# Patient Record
Sex: Female | Born: 1955 | Race: Black or African American | Hispanic: No | Marital: Single | Smoking: Never smoker
Health system: Southern US, Community
[De-identification: ages and names within clinical notes are randomized; demographics above are authoritative.]

## PROBLEM LIST (undated history)

## (undated) DIAGNOSIS — D689 Coagulation defect, unspecified: Secondary | ICD-10-CM

## (undated) DIAGNOSIS — F329 Major depressive disorder, single episode, unspecified: Secondary | ICD-10-CM

## (undated) DIAGNOSIS — F32A Depression, unspecified: Secondary | ICD-10-CM

## (undated) DIAGNOSIS — R0600 Dyspnea, unspecified: Secondary | ICD-10-CM

## (undated) DIAGNOSIS — M961 Postlaminectomy syndrome, not elsewhere classified: Secondary | ICD-10-CM

## (undated) DIAGNOSIS — R51 Headache: Secondary | ICD-10-CM

## (undated) DIAGNOSIS — R519 Headache, unspecified: Secondary | ICD-10-CM

## (undated) DIAGNOSIS — F419 Anxiety disorder, unspecified: Secondary | ICD-10-CM

## (undated) DIAGNOSIS — I639 Cerebral infarction, unspecified: Secondary | ICD-10-CM

## (undated) DIAGNOSIS — K219 Gastro-esophageal reflux disease without esophagitis: Secondary | ICD-10-CM

## (undated) DIAGNOSIS — M47817 Spondylosis without myelopathy or radiculopathy, lumbosacral region: Secondary | ICD-10-CM

## (undated) DIAGNOSIS — G629 Polyneuropathy, unspecified: Secondary | ICD-10-CM

## (undated) HISTORY — DX: Spondylosis without myelopathy or radiculopathy, lumbosacral region: M47.817

## (undated) HISTORY — PX: ABDOMINAL HYSTERECTOMY: SHX81

## (undated) HISTORY — DX: Postlaminectomy syndrome, not elsewhere classified: M96.1

## (undated) HISTORY — PX: KNEE ARTHROSCOPY: SUR90

## (undated) HISTORY — PX: BACK SURGERY: SHX140

## (undated) HISTORY — PX: EYE SURGERY: SHX253

## (undated) SURGERY — IMAGING PROCEDURE, GI TRACT, INTRALUMINAL, VIA CAPSULE
Anesthesia: LOCAL

---

## 1997-11-24 ENCOUNTER — Other Ambulatory Visit: Admission: RE | Admit: 1997-11-24 | Discharge: 1997-11-24 | Payer: Self-pay | Admitting: Obstetrics

## 1997-12-09 ENCOUNTER — Emergency Department (HOSPITAL_COMMUNITY): Admission: EM | Admit: 1997-12-09 | Discharge: 1997-12-09 | Payer: Self-pay | Admitting: *Deleted

## 1997-12-12 ENCOUNTER — Encounter: Payer: Self-pay | Admitting: Ophthalmology

## 1997-12-12 ENCOUNTER — Ambulatory Visit (HOSPITAL_COMMUNITY): Admission: RE | Admit: 1997-12-12 | Discharge: 1997-12-13 | Payer: Self-pay | Admitting: Ophthalmology

## 1997-12-14 ENCOUNTER — Inpatient Hospital Stay (HOSPITAL_COMMUNITY): Admission: RE | Admit: 1997-12-14 | Discharge: 1997-12-17 | Payer: Self-pay | Admitting: Obstetrics

## 1998-01-24 ENCOUNTER — Emergency Department (HOSPITAL_COMMUNITY): Admission: EM | Admit: 1998-01-24 | Discharge: 1998-01-24 | Payer: Self-pay | Admitting: Emergency Medicine

## 1998-10-20 ENCOUNTER — Emergency Department (HOSPITAL_COMMUNITY): Admission: EM | Admit: 1998-10-20 | Discharge: 1998-10-20 | Payer: Self-pay | Admitting: Emergency Medicine

## 1998-10-20 ENCOUNTER — Encounter: Payer: Self-pay | Admitting: Emergency Medicine

## 1998-11-10 ENCOUNTER — Encounter: Admission: RE | Admit: 1998-11-10 | Discharge: 1998-11-10 | Payer: Self-pay | Admitting: Internal Medicine

## 1998-11-16 ENCOUNTER — Encounter: Admission: RE | Admit: 1998-11-16 | Discharge: 1998-11-16 | Payer: Self-pay | Admitting: Obstetrics

## 1998-11-21 ENCOUNTER — Encounter: Admission: RE | Admit: 1998-11-21 | Discharge: 1998-11-21 | Payer: Self-pay | Admitting: Hematology and Oncology

## 1999-05-03 ENCOUNTER — Encounter: Admission: RE | Admit: 1999-05-03 | Discharge: 1999-05-03 | Payer: Self-pay | Admitting: Hematology and Oncology

## 1999-06-27 ENCOUNTER — Encounter: Admission: RE | Admit: 1999-06-27 | Discharge: 1999-06-27 | Payer: Self-pay | Admitting: Hematology and Oncology

## 1999-08-01 ENCOUNTER — Encounter: Payer: Self-pay | Admitting: Orthopedic Surgery

## 1999-08-01 ENCOUNTER — Ambulatory Visit (HOSPITAL_COMMUNITY): Admission: RE | Admit: 1999-08-01 | Discharge: 1999-08-01 | Payer: Self-pay | Admitting: Orthopedic Surgery

## 1999-12-26 ENCOUNTER — Encounter: Admission: RE | Admit: 1999-12-26 | Discharge: 1999-12-26 | Payer: Self-pay | Admitting: Hematology and Oncology

## 2000-01-02 ENCOUNTER — Encounter: Admission: RE | Admit: 2000-01-02 | Discharge: 2000-01-02 | Payer: Self-pay

## 2000-03-11 ENCOUNTER — Encounter: Payer: Self-pay | Admitting: Emergency Medicine

## 2000-03-11 ENCOUNTER — Emergency Department (HOSPITAL_COMMUNITY): Admission: EM | Admit: 2000-03-11 | Discharge: 2000-03-11 | Payer: Self-pay | Admitting: Emergency Medicine

## 2000-03-18 ENCOUNTER — Encounter: Admission: RE | Admit: 2000-03-18 | Discharge: 2000-03-18 | Payer: Self-pay | Admitting: Internal Medicine

## 2000-03-25 ENCOUNTER — Encounter: Admission: RE | Admit: 2000-03-25 | Discharge: 2000-03-25 | Payer: Self-pay | Admitting: Obstetrics & Gynecology

## 2000-06-12 ENCOUNTER — Encounter: Admission: RE | Admit: 2000-06-12 | Discharge: 2000-06-12 | Payer: Self-pay

## 2000-07-01 ENCOUNTER — Encounter: Admission: RE | Admit: 2000-07-01 | Discharge: 2000-07-01 | Payer: Self-pay | Admitting: Obstetrics & Gynecology

## 2000-08-14 ENCOUNTER — Ambulatory Visit (HOSPITAL_COMMUNITY): Admission: RE | Admit: 2000-08-14 | Discharge: 2000-08-14 | Payer: Self-pay | Admitting: Obstetrics & Gynecology

## 2000-12-15 ENCOUNTER — Encounter: Admission: RE | Admit: 2000-12-15 | Discharge: 2000-12-15 | Payer: Self-pay

## 2000-12-17 ENCOUNTER — Encounter: Admission: RE | Admit: 2000-12-17 | Discharge: 2001-03-17 | Payer: Self-pay | Admitting: Family Medicine

## 2000-12-22 ENCOUNTER — Encounter: Admission: RE | Admit: 2000-12-22 | Discharge: 2000-12-22 | Payer: Self-pay

## 2001-02-04 ENCOUNTER — Emergency Department (HOSPITAL_COMMUNITY): Admission: EM | Admit: 2001-02-04 | Discharge: 2001-02-04 | Payer: Self-pay | Admitting: Emergency Medicine

## 2002-02-08 ENCOUNTER — Emergency Department (HOSPITAL_COMMUNITY): Admission: EM | Admit: 2002-02-08 | Discharge: 2002-02-08 | Payer: Self-pay

## 2002-03-31 ENCOUNTER — Encounter: Payer: Self-pay | Admitting: Neurology

## 2002-03-31 ENCOUNTER — Inpatient Hospital Stay (HOSPITAL_COMMUNITY): Admission: EM | Admit: 2002-03-31 | Discharge: 2002-04-10 | Payer: Self-pay | Admitting: *Deleted

## 2002-04-01 ENCOUNTER — Encounter: Payer: Self-pay | Admitting: Neurology

## 2002-04-02 ENCOUNTER — Encounter: Payer: Self-pay | Admitting: Neurology

## 2002-04-04 ENCOUNTER — Encounter: Payer: Self-pay | Admitting: Neurology

## 2002-04-05 ENCOUNTER — Encounter: Payer: Self-pay | Admitting: Neurology

## 2002-06-04 ENCOUNTER — Emergency Department (HOSPITAL_COMMUNITY): Admission: EM | Admit: 2002-06-04 | Discharge: 2002-06-04 | Payer: Self-pay | Admitting: Emergency Medicine

## 2002-06-04 ENCOUNTER — Encounter: Payer: Self-pay | Admitting: Internal Medicine

## 2002-08-02 ENCOUNTER — Emergency Department (HOSPITAL_COMMUNITY): Admission: EM | Admit: 2002-08-02 | Discharge: 2002-08-02 | Payer: Self-pay | Admitting: Emergency Medicine

## 2002-08-03 ENCOUNTER — Encounter (INDEPENDENT_AMBULATORY_CARE_PROVIDER_SITE_OTHER): Payer: Self-pay | Admitting: Family Medicine

## 2002-08-03 LAB — CONVERTED CEMR LAB: Pap Smear: NORMAL

## 2002-09-07 ENCOUNTER — Ambulatory Visit (HOSPITAL_COMMUNITY): Admission: RE | Admit: 2002-09-07 | Discharge: 2002-09-07 | Payer: Self-pay | Admitting: Family Medicine

## 2002-09-07 ENCOUNTER — Encounter: Payer: Self-pay | Admitting: Family Medicine

## 2002-09-14 ENCOUNTER — Ambulatory Visit (HOSPITAL_COMMUNITY): Admission: RE | Admit: 2002-09-14 | Discharge: 2002-09-14 | Payer: Self-pay | Admitting: Family Medicine

## 2002-09-21 ENCOUNTER — Encounter: Payer: Self-pay | Admitting: Neurology

## 2002-09-21 ENCOUNTER — Ambulatory Visit (HOSPITAL_COMMUNITY): Admission: RE | Admit: 2002-09-21 | Discharge: 2002-09-21 | Payer: Self-pay | Admitting: Neurology

## 2003-08-15 ENCOUNTER — Ambulatory Visit (HOSPITAL_COMMUNITY): Admission: RE | Admit: 2003-08-15 | Discharge: 2003-08-15 | Payer: Self-pay | Admitting: Family Medicine

## 2003-10-10 ENCOUNTER — Ambulatory Visit (HOSPITAL_COMMUNITY): Admission: RE | Admit: 2003-10-10 | Discharge: 2003-10-10 | Payer: Self-pay | Admitting: Family Medicine

## 2003-11-03 ENCOUNTER — Ambulatory Visit: Payer: Self-pay | Admitting: Family Medicine

## 2003-11-03 ENCOUNTER — Ambulatory Visit: Payer: Self-pay | Admitting: *Deleted

## 2003-11-14 ENCOUNTER — Ambulatory Visit: Payer: Self-pay | Admitting: Family Medicine

## 2003-11-28 ENCOUNTER — Encounter: Admission: RE | Admit: 2003-11-28 | Discharge: 2003-12-12 | Payer: Self-pay | Admitting: Family Medicine

## 2003-12-05 ENCOUNTER — Ambulatory Visit: Payer: Self-pay | Admitting: Family Medicine

## 2004-01-03 ENCOUNTER — Ambulatory Visit: Payer: Self-pay | Admitting: Family Medicine

## 2004-01-16 ENCOUNTER — Ambulatory Visit: Payer: Self-pay | Admitting: Family Medicine

## 2004-02-28 ENCOUNTER — Ambulatory Visit: Payer: Self-pay | Admitting: Family Medicine

## 2004-03-08 ENCOUNTER — Ambulatory Visit: Payer: Self-pay | Admitting: Family Medicine

## 2004-03-14 ENCOUNTER — Emergency Department (HOSPITAL_COMMUNITY): Admission: EM | Admit: 2004-03-14 | Discharge: 2004-03-14 | Payer: Self-pay | Admitting: Emergency Medicine

## 2004-03-16 ENCOUNTER — Ambulatory Visit: Payer: Self-pay | Admitting: Family Medicine

## 2004-03-23 ENCOUNTER — Ambulatory Visit (HOSPITAL_COMMUNITY): Admission: RE | Admit: 2004-03-23 | Discharge: 2004-03-23 | Payer: Self-pay | Admitting: Family Medicine

## 2004-04-10 ENCOUNTER — Ambulatory Visit: Payer: Self-pay | Admitting: Family Medicine

## 2004-04-23 ENCOUNTER — Ambulatory Visit: Payer: Self-pay | Admitting: Family Medicine

## 2004-05-10 ENCOUNTER — Ambulatory Visit: Payer: Self-pay | Admitting: Family Medicine

## 2004-05-16 ENCOUNTER — Ambulatory Visit: Payer: Self-pay | Admitting: Family Medicine

## 2004-05-21 ENCOUNTER — Ambulatory Visit: Payer: Self-pay | Admitting: Family Medicine

## 2004-05-23 ENCOUNTER — Ambulatory Visit (HOSPITAL_COMMUNITY): Admission: RE | Admit: 2004-05-23 | Discharge: 2004-05-23 | Payer: Self-pay | Admitting: Family Medicine

## 2004-06-01 ENCOUNTER — Ambulatory Visit: Payer: Self-pay | Admitting: Family Medicine

## 2004-06-15 ENCOUNTER — Ambulatory Visit (HOSPITAL_COMMUNITY): Admission: RE | Admit: 2004-06-15 | Discharge: 2004-06-15 | Payer: Self-pay | Admitting: Family Medicine

## 2004-06-20 ENCOUNTER — Ambulatory Visit: Payer: Self-pay | Admitting: Family Medicine

## 2004-06-25 ENCOUNTER — Emergency Department (HOSPITAL_COMMUNITY): Admission: EM | Admit: 2004-06-25 | Discharge: 2004-06-25 | Payer: Self-pay | Admitting: Emergency Medicine

## 2004-06-27 ENCOUNTER — Ambulatory Visit: Payer: Self-pay | Admitting: Internal Medicine

## 2004-08-01 ENCOUNTER — Ambulatory Visit: Payer: Self-pay | Admitting: Family Medicine

## 2004-08-28 ENCOUNTER — Ambulatory Visit: Payer: Self-pay | Admitting: Family Medicine

## 2004-09-03 ENCOUNTER — Ambulatory Visit (HOSPITAL_COMMUNITY): Admission: RE | Admit: 2004-09-03 | Discharge: 2004-09-03 | Payer: Self-pay | Admitting: Family Medicine

## 2004-10-15 ENCOUNTER — Ambulatory Visit: Payer: Self-pay | Admitting: Family Medicine

## 2004-10-23 ENCOUNTER — Ambulatory Visit: Payer: Self-pay | Admitting: Family Medicine

## 2004-11-01 ENCOUNTER — Ambulatory Visit: Payer: Self-pay | Admitting: Family Medicine

## 2004-11-08 ENCOUNTER — Ambulatory Visit: Payer: Self-pay | Admitting: Family Medicine

## 2004-11-21 ENCOUNTER — Ambulatory Visit: Payer: Self-pay | Admitting: Family Medicine

## 2004-12-26 ENCOUNTER — Ambulatory Visit: Payer: Self-pay | Admitting: Family Medicine

## 2005-01-23 ENCOUNTER — Ambulatory Visit: Payer: Self-pay | Admitting: Family Medicine

## 2005-02-06 ENCOUNTER — Ambulatory Visit: Payer: Self-pay | Admitting: Family Medicine

## 2005-03-11 ENCOUNTER — Emergency Department (HOSPITAL_COMMUNITY): Admission: EM | Admit: 2005-03-11 | Discharge: 2005-03-11 | Payer: Self-pay | Admitting: Family Medicine

## 2005-03-11 ENCOUNTER — Ambulatory Visit: Payer: Self-pay | Admitting: Family Medicine

## 2005-04-10 ENCOUNTER — Ambulatory Visit: Payer: Self-pay | Admitting: Family Medicine

## 2005-04-16 ENCOUNTER — Ambulatory Visit: Payer: Self-pay | Admitting: Family Medicine

## 2005-04-18 ENCOUNTER — Ambulatory Visit (HOSPITAL_COMMUNITY): Admission: RE | Admit: 2005-04-18 | Discharge: 2005-04-18 | Payer: Self-pay | Admitting: Family Medicine

## 2005-05-03 ENCOUNTER — Ambulatory Visit: Payer: Self-pay | Admitting: Family Medicine

## 2005-05-08 ENCOUNTER — Ambulatory Visit: Payer: Self-pay | Admitting: Family Medicine

## 2005-05-13 ENCOUNTER — Encounter: Admission: RE | Admit: 2005-05-13 | Discharge: 2005-07-24 | Payer: Self-pay | Admitting: Family Medicine

## 2005-05-15 ENCOUNTER — Emergency Department (HOSPITAL_COMMUNITY): Admission: EM | Admit: 2005-05-15 | Discharge: 2005-05-15 | Payer: Self-pay | Admitting: Family Medicine

## 2005-05-20 ENCOUNTER — Ambulatory Visit: Payer: Self-pay | Admitting: Family Medicine

## 2005-05-20 ENCOUNTER — Ambulatory Visit (HOSPITAL_COMMUNITY): Admission: RE | Admit: 2005-05-20 | Discharge: 2005-05-20 | Payer: Self-pay | Admitting: Family Medicine

## 2005-06-10 ENCOUNTER — Ambulatory Visit: Payer: Self-pay | Admitting: Family Medicine

## 2005-06-17 ENCOUNTER — Ambulatory Visit: Payer: Self-pay | Admitting: Family Medicine

## 2005-06-25 ENCOUNTER — Ambulatory Visit: Payer: Self-pay | Admitting: Family Medicine

## 2005-06-27 ENCOUNTER — Emergency Department (HOSPITAL_COMMUNITY): Admission: EM | Admit: 2005-06-27 | Discharge: 2005-06-27 | Payer: Self-pay | Admitting: Emergency Medicine

## 2005-07-03 ENCOUNTER — Ambulatory Visit: Payer: Self-pay | Admitting: Family Medicine

## 2005-08-13 ENCOUNTER — Ambulatory Visit: Payer: Self-pay | Admitting: Family Medicine

## 2005-08-20 ENCOUNTER — Encounter: Admission: RE | Admit: 2005-08-20 | Discharge: 2005-11-18 | Payer: Self-pay | Admitting: Family Medicine

## 2005-08-28 ENCOUNTER — Ambulatory Visit: Payer: Self-pay | Admitting: Internal Medicine

## 2005-09-09 ENCOUNTER — Ambulatory Visit: Payer: Self-pay | Admitting: Family Medicine

## 2005-09-26 ENCOUNTER — Ambulatory Visit: Payer: Self-pay | Admitting: Internal Medicine

## 2005-10-29 ENCOUNTER — Ambulatory Visit: Payer: Self-pay | Admitting: Family Medicine

## 2005-11-28 ENCOUNTER — Ambulatory Visit: Payer: Self-pay | Admitting: Internal Medicine

## 2005-12-10 ENCOUNTER — Ambulatory Visit: Payer: Self-pay | Admitting: Family Medicine

## 2005-12-18 ENCOUNTER — Ambulatory Visit: Payer: Self-pay | Admitting: Family Medicine

## 2005-12-23 DIAGNOSIS — S0570XA Avulsion of unspecified eye, initial encounter: Secondary | ICD-10-CM

## 2006-01-08 ENCOUNTER — Ambulatory Visit: Payer: Self-pay | Admitting: Internal Medicine

## 2006-01-28 ENCOUNTER — Ambulatory Visit (HOSPITAL_BASED_OUTPATIENT_CLINIC_OR_DEPARTMENT_OTHER): Admission: RE | Admit: 2006-01-28 | Discharge: 2006-01-28 | Payer: Self-pay | Admitting: Orthopaedic Surgery

## 2006-02-05 ENCOUNTER — Ambulatory Visit: Payer: Self-pay | Admitting: Family Medicine

## 2006-02-17 ENCOUNTER — Ambulatory Visit: Payer: Self-pay | Admitting: Family Medicine

## 2006-03-05 ENCOUNTER — Ambulatory Visit: Payer: Self-pay | Admitting: Family Medicine

## 2006-03-07 ENCOUNTER — Ambulatory Visit (HOSPITAL_COMMUNITY): Admission: RE | Admit: 2006-03-07 | Discharge: 2006-03-07 | Payer: Self-pay | Admitting: Family Medicine

## 2006-03-19 ENCOUNTER — Encounter: Admission: RE | Admit: 2006-03-19 | Discharge: 2006-03-19 | Payer: Self-pay | Admitting: Family Medicine

## 2006-03-19 ENCOUNTER — Emergency Department (HOSPITAL_COMMUNITY): Admission: EM | Admit: 2006-03-19 | Discharge: 2006-03-19 | Payer: Self-pay | Admitting: Emergency Medicine

## 2006-04-02 ENCOUNTER — Ambulatory Visit: Payer: Self-pay | Admitting: Family Medicine

## 2006-05-07 ENCOUNTER — Ambulatory Visit: Payer: Self-pay | Admitting: Family Medicine

## 2006-05-20 ENCOUNTER — Ambulatory Visit: Payer: Self-pay | Admitting: Family Medicine

## 2006-06-17 ENCOUNTER — Ambulatory Visit: Payer: Self-pay | Admitting: Family Medicine

## 2006-06-24 ENCOUNTER — Ambulatory Visit: Payer: Self-pay | Admitting: Family Medicine

## 2006-07-03 ENCOUNTER — Ambulatory Visit: Payer: Self-pay | Admitting: Family Medicine

## 2006-07-09 ENCOUNTER — Ambulatory Visit: Payer: Self-pay | Admitting: Family Medicine

## 2006-07-23 ENCOUNTER — Ambulatory Visit: Payer: Self-pay | Admitting: Family Medicine

## 2006-07-24 ENCOUNTER — Encounter (INDEPENDENT_AMBULATORY_CARE_PROVIDER_SITE_OTHER): Payer: Self-pay | Admitting: Family Medicine

## 2006-07-24 DIAGNOSIS — M1990 Unspecified osteoarthritis, unspecified site: Secondary | ICD-10-CM | POA: Insufficient documentation

## 2006-07-24 DIAGNOSIS — F329 Major depressive disorder, single episode, unspecified: Secondary | ICD-10-CM | POA: Insufficient documentation

## 2006-07-24 DIAGNOSIS — G5600 Carpal tunnel syndrome, unspecified upper limb: Secondary | ICD-10-CM

## 2006-07-24 DIAGNOSIS — M5106 Intervertebral disc disorders with myelopathy, lumbar region: Secondary | ICD-10-CM

## 2006-07-24 DIAGNOSIS — H4420 Degenerative myopia, unspecified eye: Secondary | ICD-10-CM | POA: Insufficient documentation

## 2006-07-24 DIAGNOSIS — G08 Intracranial and intraspinal phlebitis and thrombophlebitis: Secondary | ICD-10-CM

## 2006-08-27 ENCOUNTER — Ambulatory Visit: Payer: Self-pay | Admitting: Family Medicine

## 2006-08-31 ENCOUNTER — Emergency Department (HOSPITAL_COMMUNITY): Admission: EM | Admit: 2006-08-31 | Discharge: 2006-08-31 | Payer: Self-pay | Admitting: Emergency Medicine

## 2006-09-01 ENCOUNTER — Ambulatory Visit: Payer: Self-pay | Admitting: Family Medicine

## 2006-09-09 ENCOUNTER — Ambulatory Visit: Payer: Self-pay | Admitting: Internal Medicine

## 2006-09-16 ENCOUNTER — Ambulatory Visit: Payer: Self-pay | Admitting: Family Medicine

## 2006-09-17 ENCOUNTER — Ambulatory Visit (HOSPITAL_COMMUNITY): Admission: RE | Admit: 2006-09-17 | Discharge: 2006-09-17 | Payer: Self-pay | Admitting: Family Medicine

## 2006-09-22 ENCOUNTER — Ambulatory Visit (HOSPITAL_COMMUNITY): Admission: RE | Admit: 2006-09-22 | Discharge: 2006-09-22 | Payer: Self-pay | Admitting: Family Medicine

## 2006-09-24 ENCOUNTER — Ambulatory Visit: Payer: Self-pay | Admitting: Family Medicine

## 2006-10-27 ENCOUNTER — Ambulatory Visit: Payer: Self-pay | Admitting: Internal Medicine

## 2006-11-05 ENCOUNTER — Ambulatory Visit: Payer: Self-pay | Admitting: Family Medicine

## 2006-11-28 ENCOUNTER — Ambulatory Visit: Payer: Self-pay | Admitting: Family Medicine

## 2006-12-08 ENCOUNTER — Emergency Department (HOSPITAL_COMMUNITY): Admission: EM | Admit: 2006-12-08 | Discharge: 2006-12-08 | Payer: Self-pay | Admitting: Emergency Medicine

## 2006-12-11 ENCOUNTER — Ambulatory Visit: Payer: Self-pay | Admitting: Family Medicine

## 2006-12-18 ENCOUNTER — Ambulatory Visit: Payer: Self-pay | Admitting: Family Medicine

## 2006-12-24 ENCOUNTER — Ambulatory Visit: Payer: Self-pay | Admitting: Nurse Practitioner

## 2007-01-21 ENCOUNTER — Ambulatory Visit: Payer: Self-pay | Admitting: Family Medicine

## 2007-02-02 ENCOUNTER — Ambulatory Visit: Payer: Self-pay | Admitting: Family Medicine

## 2007-02-04 ENCOUNTER — Ambulatory Visit: Payer: Self-pay | Admitting: Family Medicine

## 2007-02-11 ENCOUNTER — Ambulatory Visit: Payer: Self-pay | Admitting: Family Medicine

## 2007-02-16 ENCOUNTER — Emergency Department (HOSPITAL_COMMUNITY): Admission: EM | Admit: 2007-02-16 | Discharge: 2007-02-16 | Payer: Self-pay | Admitting: Emergency Medicine

## 2007-02-19 ENCOUNTER — Ambulatory Visit: Payer: Self-pay | Admitting: Cardiology

## 2007-02-23 ENCOUNTER — Ambulatory Visit: Payer: Self-pay | Admitting: Family Medicine

## 2007-03-02 ENCOUNTER — Ambulatory Visit: Payer: Self-pay | Admitting: Family Medicine

## 2007-03-18 ENCOUNTER — Ambulatory Visit: Payer: Self-pay | Admitting: Family Medicine

## 2007-04-15 ENCOUNTER — Ambulatory Visit: Payer: Self-pay | Admitting: Family Medicine

## 2007-05-11 ENCOUNTER — Ambulatory Visit: Payer: Self-pay | Admitting: Family Medicine

## 2007-05-25 ENCOUNTER — Ambulatory Visit: Payer: Self-pay | Admitting: Family Medicine

## 2007-06-02 ENCOUNTER — Emergency Department (HOSPITAL_COMMUNITY): Admission: EM | Admit: 2007-06-02 | Discharge: 2007-06-02 | Payer: Self-pay | Admitting: Emergency Medicine

## 2007-06-22 ENCOUNTER — Ambulatory Visit: Payer: Self-pay | Admitting: Family Medicine

## 2007-07-13 ENCOUNTER — Ambulatory Visit: Payer: Self-pay | Admitting: Internal Medicine

## 2007-08-03 ENCOUNTER — Ambulatory Visit: Payer: Self-pay | Admitting: Family Medicine

## 2007-08-31 ENCOUNTER — Ambulatory Visit: Payer: Self-pay | Admitting: Family Medicine

## 2007-10-26 ENCOUNTER — Ambulatory Visit: Payer: Self-pay | Admitting: Family Medicine

## 2007-11-19 ENCOUNTER — Ambulatory Visit: Payer: Self-pay | Admitting: Family Medicine

## 2007-11-19 LAB — CONVERTED CEMR LAB: Prothrombin Time: 35.8 s — ABNORMAL HIGH (ref 11.6–15.2)

## 2007-12-21 ENCOUNTER — Ambulatory Visit: Payer: Self-pay | Admitting: Family Medicine

## 2008-01-06 ENCOUNTER — Ambulatory Visit (HOSPITAL_COMMUNITY)
Admission: RE | Admit: 2008-01-06 | Discharge: 2008-01-06 | Payer: Self-pay | Admitting: Physical Medicine and Rehabilitation

## 2008-01-16 ENCOUNTER — Emergency Department (HOSPITAL_COMMUNITY): Admission: EM | Admit: 2008-01-16 | Discharge: 2008-01-16 | Payer: Self-pay | Admitting: Emergency Medicine

## 2008-02-01 ENCOUNTER — Ambulatory Visit: Payer: Self-pay | Admitting: Family Medicine

## 2008-02-01 LAB — CONVERTED CEMR LAB: Prothrombin Time: 23.6 s — ABNORMAL HIGH (ref 11.6–15.2)

## 2008-02-02 ENCOUNTER — Ambulatory Visit (HOSPITAL_COMMUNITY): Admission: RE | Admit: 2008-02-02 | Discharge: 2008-02-02 | Payer: Self-pay | Admitting: Neurological Surgery

## 2008-03-01 ENCOUNTER — Ambulatory Visit: Payer: Self-pay | Admitting: Family Medicine

## 2008-03-14 ENCOUNTER — Ambulatory Visit: Payer: Self-pay | Admitting: Family Medicine

## 2008-03-21 ENCOUNTER — Ambulatory Visit: Payer: Self-pay | Admitting: Family Medicine

## 2008-04-08 ENCOUNTER — Ambulatory Visit: Payer: Self-pay | Admitting: Family Medicine

## 2008-04-18 ENCOUNTER — Ambulatory Visit: Payer: Self-pay | Admitting: Family Medicine

## 2008-04-25 ENCOUNTER — Ambulatory Visit: Payer: Self-pay | Admitting: Family Medicine

## 2008-05-02 ENCOUNTER — Ambulatory Visit: Payer: Self-pay | Admitting: Family Medicine

## 2008-05-23 ENCOUNTER — Ambulatory Visit: Payer: Self-pay | Admitting: Adult Health

## 2008-06-20 ENCOUNTER — Ambulatory Visit: Payer: Self-pay | Admitting: Adult Health

## 2008-07-13 ENCOUNTER — Ambulatory Visit: Payer: Self-pay | Admitting: Family Medicine

## 2008-07-13 LAB — CONVERTED CEMR LAB
INR: 1 (ref 0.0–1.5)
Prothrombin Time: 13.6 s (ref 11.6–15.2)
aPTT: 27 s (ref 24–37)

## 2008-07-26 ENCOUNTER — Ambulatory Visit: Payer: Self-pay | Admitting: Family Medicine

## 2008-07-27 ENCOUNTER — Encounter (INDEPENDENT_AMBULATORY_CARE_PROVIDER_SITE_OTHER): Payer: Self-pay | Admitting: Family Medicine

## 2008-07-27 LAB — CONVERTED CEMR LAB
ALT: 15 units/L (ref 0–35)
AST: 17 units/L (ref 0–37)
Alkaline Phosphatase: 96 units/L (ref 39–117)
BUN: 11 mg/dL (ref 6–23)
Basophils Relative: 0 % (ref 0–1)
Chloride: 106 meq/L (ref 96–112)
Creatinine, Ser: 0.64 mg/dL (ref 0.40–1.20)
Eosinophils Absolute: 0.1 10*3/uL (ref 0.0–0.7)
HDL: 73 mg/dL (ref >39)
LDL Cholesterol: 130 mg/dL — ABNORMAL HIGH (ref 0–99)
MCHC: 33 g/dL (ref 30.0–36.0)
MCV: 97 fL (ref 78.0–100.0)
Monocytes Relative: 9 % (ref 3–12)
Neutro Abs: 7.2 10*3/uL (ref 1.7–7.7)
Neutrophils Relative %: 66 % (ref 43–77)
Platelets: 314 10*3/uL (ref 150–400)
RBC: 3.94 M/uL (ref 3.87–5.11)
Sed Rate: 45 mm/hr — ABNORMAL HIGH (ref 0–22)
Total Bilirubin: 0.3 mg/dL (ref 0.3–1.2)
Total CHOL/HDL Ratio: 3.1
VLDL: 21 mg/dL (ref 0–40)
WBC: 10.9 10*3/uL — ABNORMAL HIGH (ref 4.0–10.5)

## 2008-08-02 ENCOUNTER — Ambulatory Visit (HOSPITAL_COMMUNITY): Admission: RE | Admit: 2008-08-02 | Discharge: 2008-08-02 | Payer: Self-pay | Admitting: Family Medicine

## 2008-08-17 ENCOUNTER — Ambulatory Visit: Payer: Self-pay | Admitting: Family Medicine

## 2008-09-20 ENCOUNTER — Ambulatory Visit: Payer: Self-pay | Admitting: Family Medicine

## 2008-10-10 ENCOUNTER — Ambulatory Visit: Payer: Self-pay | Admitting: Family Medicine

## 2008-10-31 ENCOUNTER — Ambulatory Visit: Payer: Self-pay | Admitting: Family Medicine

## 2008-11-11 ENCOUNTER — Ambulatory Visit (HOSPITAL_COMMUNITY): Admission: RE | Admit: 2008-11-11 | Discharge: 2008-11-11 | Payer: Self-pay | Admitting: *Deleted

## 2008-11-14 ENCOUNTER — Ambulatory Visit: Payer: Self-pay | Admitting: Family Medicine

## 2008-12-06 ENCOUNTER — Ambulatory Visit: Payer: Self-pay | Admitting: Family Medicine

## 2009-01-04 ENCOUNTER — Ambulatory Visit: Payer: Self-pay | Admitting: Thoracic Surgery

## 2009-01-12 ENCOUNTER — Ambulatory Visit: Payer: Self-pay | Admitting: Thoracic Surgery

## 2009-01-12 ENCOUNTER — Inpatient Hospital Stay (HOSPITAL_COMMUNITY): Admission: RE | Admit: 2009-01-12 | Discharge: 2009-01-31 | Payer: Self-pay | Admitting: *Deleted

## 2009-01-24 ENCOUNTER — Encounter: Payer: Self-pay | Admitting: Orthopedic Surgery

## 2009-02-12 ENCOUNTER — Emergency Department (HOSPITAL_COMMUNITY): Admission: EM | Admit: 2009-02-12 | Discharge: 2009-02-12 | Payer: Self-pay | Admitting: Emergency Medicine

## 2009-02-21 ENCOUNTER — Encounter: Admission: RE | Admit: 2009-02-21 | Discharge: 2009-02-21 | Payer: Self-pay | Admitting: Thoracic Surgery

## 2009-02-21 ENCOUNTER — Ambulatory Visit: Payer: Self-pay | Admitting: Thoracic Surgery

## 2009-03-09 ENCOUNTER — Emergency Department (HOSPITAL_COMMUNITY): Admission: EM | Admit: 2009-03-09 | Discharge: 2009-03-09 | Payer: Self-pay | Admitting: Emergency Medicine

## 2009-03-09 ENCOUNTER — Telehealth (INDEPENDENT_AMBULATORY_CARE_PROVIDER_SITE_OTHER): Payer: Self-pay | Admitting: *Deleted

## 2009-03-30 ENCOUNTER — Ambulatory Visit: Payer: Self-pay | Admitting: Family Medicine

## 2009-03-30 ENCOUNTER — Ambulatory Visit: Payer: Self-pay | Admitting: Internal Medicine

## 2009-04-04 ENCOUNTER — Ambulatory Visit: Payer: Self-pay | Admitting: Thoracic Surgery

## 2009-04-04 ENCOUNTER — Encounter: Admission: RE | Admit: 2009-04-04 | Discharge: 2009-04-04 | Payer: Self-pay | Admitting: Thoracic Surgery

## 2009-06-07 ENCOUNTER — Encounter: Admission: RE | Admit: 2009-06-07 | Discharge: 2009-08-22 | Payer: Self-pay | Admitting: *Deleted

## 2009-06-14 ENCOUNTER — Encounter: Admission: RE | Admit: 2009-06-14 | Discharge: 2009-06-14 | Payer: Self-pay | Admitting: Thoracic Surgery

## 2009-06-14 ENCOUNTER — Ambulatory Visit: Payer: Self-pay | Admitting: Thoracic Surgery

## 2009-07-10 ENCOUNTER — Ambulatory Visit: Payer: Self-pay | Admitting: Internal Medicine

## 2009-07-10 ENCOUNTER — Emergency Department (HOSPITAL_COMMUNITY): Admission: EM | Admit: 2009-07-10 | Discharge: 2009-07-10 | Payer: Self-pay | Admitting: Emergency Medicine

## 2009-07-10 ENCOUNTER — Observation Stay (HOSPITAL_COMMUNITY): Admission: EM | Admit: 2009-07-10 | Discharge: 2009-07-11 | Payer: Self-pay | Admitting: Emergency Medicine

## 2009-07-11 ENCOUNTER — Encounter: Payer: Self-pay | Admitting: Cardiology

## 2009-07-17 ENCOUNTER — Telehealth (INDEPENDENT_AMBULATORY_CARE_PROVIDER_SITE_OTHER): Payer: Self-pay | Admitting: *Deleted

## 2009-10-18 ENCOUNTER — Encounter
Admission: RE | Admit: 2009-10-18 | Discharge: 2010-01-16 | Payer: Self-pay | Admitting: Physical Medicine & Rehabilitation

## 2009-10-26 ENCOUNTER — Ambulatory Visit: Payer: Self-pay | Admitting: Physical Medicine & Rehabilitation

## 2009-11-17 ENCOUNTER — Ambulatory Visit: Payer: Self-pay | Admitting: Physical Medicine & Rehabilitation

## 2010-02-06 ENCOUNTER — Encounter
Admission: RE | Admit: 2010-02-06 | Discharge: 2010-04-03 | Payer: Self-pay | Source: Home / Self Care | Attending: Physical Medicine & Rehabilitation | Admitting: Physical Medicine & Rehabilitation

## 2010-02-12 ENCOUNTER — Ambulatory Visit: Payer: Self-pay | Admitting: Physical Medicine & Rehabilitation

## 2010-03-14 ENCOUNTER — Emergency Department (HOSPITAL_COMMUNITY)
Admission: EM | Admit: 2010-03-14 | Discharge: 2010-03-14 | Payer: Self-pay | Source: Home / Self Care | Admitting: Emergency Medicine

## 2010-03-19 LAB — URINE CULTURE
Colony Count: 9000
Culture  Setup Time: 201201111055

## 2010-03-19 LAB — URINALYSIS, ROUTINE W REFLEX MICROSCOPIC
Bilirubin Urine: NEGATIVE
Hgb urine dipstick: NEGATIVE
Ketones, ur: NEGATIVE mg/dL
Nitrite: NEGATIVE
Protein, ur: NEGATIVE mg/dL
Specific Gravity, Urine: 1.021 (ref 1.005–1.030)
Urine Glucose, Fasting: NEGATIVE mg/dL
Urobilinogen, UA: 0.2 mg/dL (ref 0.0–1.0)
pH: 5.5 (ref 5.0–8.0)

## 2010-03-19 LAB — POCT I-STAT, CHEM 8
BUN: 21 mg/dL (ref 6–23)
Calcium, Ion: 1.21 mmol/L (ref 1.12–1.32)
Chloride: 107 mEq/L (ref 96–112)
Creatinine, Ser: 0.7 mg/dL (ref 0.4–1.2)
Glucose, Bld: 87 mg/dL (ref 70–99)
HCT: 38 % (ref 36.0–46.0)
Hemoglobin: 12.9 g/dL (ref 12.0–15.0)
Potassium: 4.1 mEq/L (ref 3.5–5.1)
Sodium: 138 mEq/L (ref 135–145)
TCO2: 25 mmol/L (ref 0–100)

## 2010-03-19 LAB — URINE MICROSCOPIC-ADD ON

## 2010-03-25 ENCOUNTER — Encounter: Payer: Self-pay | Admitting: Family Medicine

## 2010-03-25 ENCOUNTER — Encounter: Payer: Self-pay | Admitting: Thoracic Surgery

## 2010-03-26 ENCOUNTER — Encounter
Admission: RE | Admit: 2010-03-26 | Discharge: 2010-04-03 | Payer: Self-pay | Source: Home / Self Care | Attending: Physical Medicine & Rehabilitation | Admitting: Physical Medicine & Rehabilitation

## 2010-03-29 ENCOUNTER — Ambulatory Visit
Admission: RE | Admit: 2010-03-29 | Discharge: 2010-03-29 | Payer: Self-pay | Source: Home / Self Care | Attending: Physical Medicine & Rehabilitation | Admitting: Physical Medicine & Rehabilitation

## 2010-04-05 NOTE — Progress Notes (Signed)
  Faxed LOV,CT over to [facility] @ [address] [facility] Come [phone] [facility]  Jul 17, 2009 10:40 AM

## 2010-04-05 NOTE — Progress Notes (Signed)
Summary: triage/vomiting and diarrhea  Phone Note Call from Patient   Reason for Call: Talk to Nurse Summary of Call: Call from [facility] with [facility].Marland Kitchenout to discharge patient from back surgery which was done by thoracic approach in which she had 2 chest tubes on in 01/19/09...with lastest chest xray of 02/21/09 showed small pleural effusion....  Patient is started vomiting green bile early about 3am and had vomited 7 or 8 times since then.  She had been having constant abd. pain stating it is all over her abd not any one location. She has been having diarrhea 3 times since the nurse has been there today.and more before nurse arrived.Marland KitchenMarland KitchenNo blood seen.Marland KitchenMarland KitchenPatient has tried using Immodium.Marland KitchenMarland KitchenShe cannot keep any fluids on her stomach.Marland KitchenMarland KitchenShe denies any ETOH.Marland KitchenMarland KitchenShe is on chronic coumadin therapy and had a stoke in 2004.  [facility] advise patient should go to ED.Marland KitchenPatient may require fluids..Her pleural effusion may need to be monitored while patient is having vomiting episodes.Marland KitchenMarland KitchenPatient states she had an episode of vomiting and diarrhea like this while she was in hospital..[facility] states understanding and is relaying information to patient... Initial call taken by: Conchita Paris,  March 09, 2009 11:42 AM

## 2010-05-17 ENCOUNTER — Encounter: Payer: Self-pay | Admitting: Physical Medicine & Rehabilitation

## 2010-05-19 LAB — DIFFERENTIAL
Basophils Absolute: 0 10*3/uL (ref 0.0–0.1)
Basophils Relative: 0 % (ref 0–1)
Lymphocytes Relative: 17 % (ref 12–46)
Neutro Abs: 7.1 10*3/uL (ref 1.7–7.7)
Neutrophils Relative %: 75 % (ref 43–77)

## 2010-05-19 LAB — URINALYSIS, ROUTINE W REFLEX MICROSCOPIC
Glucose, UA: NEGATIVE mg/dL
Ketones, ur: 40 mg/dL — AB
Leukocytes, UA: NEGATIVE
Nitrite: NEGATIVE
Protein, ur: NEGATIVE mg/dL
Urobilinogen, UA: 0.2 mg/dL (ref 0.0–1.0)

## 2010-05-19 LAB — URINE MICROSCOPIC-ADD ON

## 2010-05-19 LAB — COMPREHENSIVE METABOLIC PANEL
Alkaline Phosphatase: 67 U/L (ref 39–117)
BUN: 5 mg/dL — ABNORMAL LOW (ref 6–23)
Creatinine, Ser: 0.59 mg/dL (ref 0.4–1.2)
Glucose, Bld: 143 mg/dL — ABNORMAL HIGH (ref 70–99)
Potassium: 3.2 mEq/L — ABNORMAL LOW (ref 3.5–5.1)
Total Bilirubin: 0.7 mg/dL (ref 0.3–1.2)
Total Protein: 8.3 g/dL (ref 6.0–8.3)

## 2010-05-19 LAB — PROTIME-INR
INR: 2.31 — ABNORMAL HIGH (ref 0.00–1.49)
Prothrombin Time: 25.2 seconds — ABNORMAL HIGH (ref 11.6–15.2)

## 2010-05-19 LAB — CBC
HCT: 41.2 % (ref 36.0–46.0)
Hemoglobin: 14.3 g/dL (ref 12.0–15.0)
MCV: 94.2 fL (ref 78.0–100.0)
Platelets: 285 10*3/uL (ref 150–400)
RDW: 16 % — ABNORMAL HIGH (ref 11.5–15.5)

## 2010-05-22 LAB — URINE CULTURE

## 2010-05-22 LAB — LIPID PANEL
Cholesterol: 180 mg/dL (ref 0–200)
LDL Cholesterol: 103 mg/dL — ABNORMAL HIGH (ref 0–99)

## 2010-05-22 LAB — CK TOTAL AND CKMB (NOT AT ARMC)
CK, MB: 0.6 ng/mL (ref 0.3–4.0)
CK, MB: 0.7 ng/mL (ref 0.3–4.0)
Relative Index: INVALID (ref 0.0–2.5)
Relative Index: INVALID (ref 0.0–2.5)
Total CK: 54 U/L (ref 7–177)

## 2010-05-22 LAB — TROPONIN I: Troponin I: 0.01 ng/mL (ref 0.00–0.06)

## 2010-05-22 LAB — BASIC METABOLIC PANEL
BUN: 11 mg/dL (ref 6–23)
BUN: 9 mg/dL (ref 6–23)
Calcium: 9.1 mg/dL (ref 8.4–10.5)
Calcium: 9.2 mg/dL (ref 8.4–10.5)
Chloride: 105 mEq/L (ref 96–112)
Creatinine, Ser: 0.42 mg/dL (ref 0.4–1.2)
Creatinine, Ser: 0.54 mg/dL (ref 0.4–1.2)
GFR calc Af Amer: >60 mL/min (ref >60)
GFR calc Af Amer: >60 mL/min (ref >60)
GFR calc non Af Amer: >60 mL/min (ref >60)
GFR calc non Af Amer: >60 mL/min (ref >60)
GFR calc non Af Amer: >60 mL/min (ref >60)
Glucose, Bld: 146 mg/dL — ABNORMAL HIGH (ref 70–99)
Potassium: 3.1 mEq/L — ABNORMAL LOW (ref 3.5–5.1)

## 2010-05-22 LAB — DIFFERENTIAL
Basophils Absolute: 0 10*3/uL (ref 0.0–0.1)
Basophils Relative: 0 % (ref 0–1)
Basophils Relative: 0 % (ref 0–1)
Eosinophils Absolute: 0.1 10*3/uL (ref 0.0–0.7)
Eosinophils Absolute: 0.1 10*3/uL (ref 0.0–0.7)
Eosinophils Relative: 2 % (ref 0–5)
Monocytes Relative: 10 % (ref 3–12)
Neutrophils Relative %: 58 % (ref 43–77)
Neutrophils Relative %: 67 % (ref 43–77)

## 2010-05-22 LAB — URINALYSIS, ROUTINE W REFLEX MICROSCOPIC
Hgb urine dipstick: NEGATIVE
Specific Gravity, Urine: 1.02 (ref 1.005–1.030)
Urobilinogen, UA: 0.2 mg/dL (ref 0.0–1.0)

## 2010-05-22 LAB — CARDIAC PANEL(CRET KIN+CKTOT+MB+TROPI)
CK, MB: 0.6 ng/mL (ref 0.3–4.0)
Relative Index: INVALID (ref 0.0–2.5)
Relative Index: INVALID (ref 0.0–2.5)
Total CK: 53 U/L (ref 7–177)
Troponin I: 0.01 ng/mL (ref 0.00–0.06)
Troponin I: <0.01 ng/mL (ref 0.00–0.06)

## 2010-05-22 LAB — CBC
MCHC: 34.4 g/dL (ref 30.0–36.0)
MCV: 100.2 fL — ABNORMAL HIGH (ref 78.0–100.0)
Platelets: 235 10*3/uL (ref 150–400)
Platelets: 237 10*3/uL (ref 150–400)
RDW: 13.3 % (ref 11.5–15.5)
WBC: 8.7 10*3/uL (ref 4.0–10.5)

## 2010-05-22 LAB — RAPID URINE DRUG SCREEN, HOSP PERFORMED
Barbiturates: NOT DETECTED
Benzodiazepines: NOT DETECTED
Cocaine: NOT DETECTED

## 2010-05-22 LAB — URINE MICROSCOPIC-ADD ON

## 2010-05-22 LAB — PHOSPHORUS: Phosphorus: 4.6 mg/dL (ref 2.3–4.6)

## 2010-05-22 LAB — PROTIME-INR
INR: 1.67 — ABNORMAL HIGH (ref 0.00–1.49)
INR: 2.04 — ABNORMAL HIGH (ref 0.00–1.49)
Prothrombin Time: 19.6 seconds — ABNORMAL HIGH (ref 11.6–15.2)
Prothrombin Time: 22.9 seconds — ABNORMAL HIGH (ref 11.6–15.2)

## 2010-06-05 LAB — URINALYSIS, ROUTINE W REFLEX MICROSCOPIC
Hgb urine dipstick: NEGATIVE
Nitrite: NEGATIVE
Protein, ur: NEGATIVE mg/dL
Specific Gravity, Urine: 1.014 (ref 1.005–1.030)
Urobilinogen, UA: 0.2 mg/dL (ref 0.0–1.0)

## 2010-06-05 LAB — CBC
HCT: 38.9 % (ref 36.0–46.0)
Hemoglobin: 13 g/dL (ref 12.0–15.0)
Platelets: 293 10*3/uL (ref 150–400)
RDW: 15.1 % (ref 11.5–15.5)
WBC: 7.6 10*3/uL (ref 4.0–10.5)

## 2010-06-05 LAB — URINE MICROSCOPIC-ADD ON

## 2010-06-05 LAB — DIFFERENTIAL
Basophils Absolute: 0 10*3/uL (ref 0.0–0.1)
Eosinophils Relative: 3 % (ref 0–5)
Lymphocytes Relative: 22 % (ref 12–46)
Lymphs Abs: 1.7 10*3/uL (ref 0.7–4.0)
Neutro Abs: 4.8 10*3/uL (ref 1.7–7.7)

## 2010-06-05 LAB — BASIC METABOLIC PANEL
BUN: <1 mg/dL — ABNORMAL LOW (ref 6–23)
Calcium: 8.7 mg/dL (ref 8.4–10.5)
GFR calc non Af Amer: >60 mL/min (ref >60)
Potassium: 3.3 mEq/L — ABNORMAL LOW (ref 3.5–5.1)
Sodium: 132 mEq/L — ABNORMAL LOW (ref 135–145)

## 2010-06-05 LAB — PROTIME-INR
INR: 2.11 — ABNORMAL HIGH (ref 0.00–1.49)
Prothrombin Time: 23.5 seconds — ABNORMAL HIGH (ref 11.6–15.2)

## 2010-06-06 LAB — BASIC METABOLIC PANEL WITH GFR
BUN: 1 mg/dL — ABNORMAL LOW (ref 6–23)
BUN: 5 mg/dL — ABNORMAL LOW (ref 6–23)
CO2: 27 meq/L (ref 19–32)
CO2: 28 meq/L (ref 19–32)
Calcium: 7.2 mg/dL — ABNORMAL LOW (ref 8.4–10.5)
Calcium: 8.2 mg/dL — ABNORMAL LOW (ref 8.4–10.5)
Chloride: 100 meq/L (ref 96–112)
Chloride: 108 meq/L (ref 96–112)
Creatinine, Ser: 0.41 mg/dL (ref 0.4–1.2)
Creatinine, Ser: 0.55 mg/dL (ref 0.4–1.2)
GFR calc non Af Amer: >60 mL/min
GFR calc non Af Amer: >60 mL/min
Glucose, Bld: 150 mg/dL — ABNORMAL HIGH (ref 70–99)
Glucose, Bld: 98 mg/dL (ref 70–99)
Potassium: 3.6 meq/L (ref 3.5–5.1)
Potassium: 3.9 meq/L (ref 3.5–5.1)
Sodium: 134 meq/L — ABNORMAL LOW (ref 135–145)
Sodium: 137 meq/L (ref 135–145)

## 2010-06-06 LAB — TYPE AND SCREEN
ABO/RH(D): O POS
Antibody Screen: NEGATIVE

## 2010-06-06 LAB — POCT I-STAT 3, ART BLOOD GAS (G3+)
Patient temperature: 97.5
pH, Arterial: 7.458 — ABNORMAL HIGH (ref 7.350–7.400)

## 2010-06-06 LAB — PROTIME-INR
INR: 1.03 (ref 0.00–1.49)
INR: 1.1 (ref 0.00–1.49)
INR: 1.1 (ref 0.00–1.49)
INR: 1.16 (ref 0.00–1.49)
INR: 1.23 (ref 0.00–1.49)
INR: 1.49 (ref 0.00–1.49)
INR: 1.52 — ABNORMAL HIGH (ref 0.00–1.49)
INR: 0.98 (ref 0.00–1.49)
Prothrombin Time: 13.4 seconds (ref 11.6–15.2)
Prothrombin Time: 14.1 seconds (ref 11.6–15.2)
Prothrombin Time: 14.7 seconds (ref 11.6–15.2)
Prothrombin Time: 15.4 seconds — ABNORMAL HIGH (ref 11.6–15.2)
Prothrombin Time: 17.9 seconds — ABNORMAL HIGH (ref 11.6–15.2)
Prothrombin Time: 12.9 s (ref 11.6–15.2)
Prothrombin Time: 14.4 seconds (ref 11.6–15.2)

## 2010-06-06 LAB — URINALYSIS, MICROSCOPIC ONLY
Bilirubin Urine: NEGATIVE
Glucose, UA: NEGATIVE mg/dL
Hgb urine dipstick: NEGATIVE
Nitrite: NEGATIVE
Specific Gravity, Urine: 1.006 (ref 1.005–1.030)
pH: 7.5 (ref 5.0–8.0)

## 2010-06-06 LAB — POCT I-STAT 7, (LYTES, BLD GAS, ICA,H+H)
Calcium, Ion: 1.17 mmol/L (ref 1.12–1.32)
Hemoglobin: 9.2 g/dL — ABNORMAL LOW (ref 12.0–15.0)
O2 Saturation: 100 %
pCO2 arterial: 43.6 mmHg (ref 35.0–45.0)
pO2, Arterial: 471 mmHg — ABNORMAL HIGH (ref 80.0–100.0)

## 2010-06-06 LAB — DIFFERENTIAL
Basophils Absolute: 0 10*3/uL (ref 0.0–0.1)
Basophils Relative: 0 % (ref 0–1)
Eosinophils Absolute: 0.1 10*3/uL (ref 0.0–0.7)
Eosinophils Relative: 1 % (ref 0–5)
Lymphocytes Relative: 24 % (ref 12–46)
Lymphs Abs: 2.5 10*3/uL (ref 0.7–4.0)
Monocytes Absolute: 1 10*3/uL (ref 0.1–1.0)
Monocytes Absolute: 0.9 10*3/uL (ref 0.1–1.0)
Monocytes Relative: 11 % (ref 3–12)

## 2010-06-06 LAB — APTT
aPTT: 29 seconds (ref 24–37)
aPTT: 31 seconds (ref 24–37)
aPTT: 25 seconds (ref 24–37)

## 2010-06-06 LAB — CBC
HCT: 20.4 % — ABNORMAL LOW (ref 36.0–46.0)
HCT: 23.8 % — ABNORMAL LOW (ref 36.0–46.0)
HCT: 24.9 % — ABNORMAL LOW (ref 36.0–46.0)
HCT: 25.2 % — ABNORMAL LOW (ref 36.0–46.0)
HCT: 28.6 % — ABNORMAL LOW (ref 36.0–46.0)
HCT: 38 % (ref 36.0–46.0)
HCT: 22.8 % — ABNORMAL LOW (ref 36.0–46.0)
HCT: 28.3 % — ABNORMAL LOW (ref 36.0–46.0)
Hemoglobin: 11.3 g/dL — ABNORMAL LOW (ref 12.0–15.0)
Hemoglobin: 13 g/dL (ref 12.0–15.0)
Hemoglobin: 6.5 g/dL — CL (ref 12.0–15.0)
Hemoglobin: 7 g/dL — ABNORMAL LOW (ref 12.0–15.0)
Hemoglobin: 8 g/dL — ABNORMAL LOW (ref 12.0–15.0)
Hemoglobin: 8.1 g/dL — ABNORMAL LOW (ref 12.0–15.0)
Hemoglobin: 8.6 g/dL — ABNORMAL LOW (ref 12.0–15.0)
Hemoglobin: 9.8 g/dL — ABNORMAL LOW (ref 12.0–15.0)
Hemoglobin: 7.7 g/dL — ABNORMAL LOW (ref 12.0–15.0)
Hemoglobin: 9.9 g/dL — ABNORMAL LOW (ref 12.0–15.0)
MCHC: 34.4 g/dL (ref 30.0–36.0)
MCHC: 33.6 g/dL (ref 30.0–36.0)
MCHC: 34 g/dL (ref 30.0–36.0)
MCHC: 34.1 g/dL (ref 30.0–36.0)
MCHC: 34.9 g/dL (ref 30.0–36.0)
MCV: 95.8 fL (ref 78.0–100.0)
MCV: 101.3 fL — ABNORMAL HIGH (ref 78.0–100.0)
MCV: 103.6 fL — ABNORMAL HIGH (ref 78.0–100.0)
MCV: 96.6 fL (ref 78.0–100.0)
Platelets: 232 10*3/uL (ref 150–400)
Platelets: 252 10*3/uL (ref 150–400)
Platelets: 258 10*3/uL (ref 150–400)
Platelets: 271 10*3/uL (ref 150–400)
Platelets: 276 10*3/uL (ref 150–400)
Platelets: 317 10*3/uL (ref 150–400)
Platelets: 147 K/uL — ABNORMAL LOW (ref 150–400)
Platelets: 166 K/uL (ref 150–400)
Platelets: 278 10*3/uL (ref 150–400)
RBC: 1.99 MIL/uL — ABNORMAL LOW (ref 3.87–5.11)
RBC: 2.54 MIL/uL — ABNORMAL LOW (ref 3.87–5.11)
RBC: 2.56 MIL/uL — ABNORMAL LOW (ref 3.87–5.11)
RBC: 3.46 MIL/uL — ABNORMAL LOW (ref 3.87–5.11)
RBC: 3.97 MIL/uL (ref 3.87–5.11)
RBC: 2.2 MIL/uL — ABNORMAL LOW (ref 3.87–5.11)
RBC: 2.39 MIL/uL — ABNORMAL LOW (ref 3.87–5.11)
RBC: 2.93 MIL/uL — ABNORMAL LOW (ref 3.87–5.11)
RBC: 3.74 MIL/uL — ABNORMAL LOW (ref 3.87–5.11)
RDW: 15.3 % (ref 11.5–15.5)
RDW: 15.7 % — ABNORMAL HIGH (ref 11.5–15.5)
RDW: 15.9 % — ABNORMAL HIGH (ref 11.5–15.5)
RDW: 17 % — ABNORMAL HIGH (ref 11.5–15.5)
RDW: 17.1 % — ABNORMAL HIGH (ref 11.5–15.5)
RDW: 12.8 % (ref 11.5–15.5)
RDW: 13.6 % (ref 11.5–15.5)
RDW: 17.8 % — ABNORMAL HIGH (ref 11.5–15.5)
WBC: 11.6 10*3/uL — ABNORMAL HIGH (ref 4.0–10.5)
WBC: 11.8 10*3/uL — ABNORMAL HIGH (ref 4.0–10.5)
WBC: 12.1 10*3/uL — ABNORMAL HIGH (ref 4.0–10.5)
WBC: 15.3 10*3/uL — ABNORMAL HIGH (ref 4.0–10.5)
WBC: 8 10*3/uL (ref 4.0–10.5)
WBC: 8.2 10*3/uL (ref 4.0–10.5)
WBC: 8.7 10*3/uL (ref 4.0–10.5)
WBC: 9 10*3/uL (ref 4.0–10.5)
WBC: 10.9 K/uL — ABNORMAL HIGH (ref 4.0–10.5)
WBC: 11.1 K/uL — ABNORMAL HIGH (ref 4.0–10.5)

## 2010-06-06 LAB — CROSSMATCH
Antibody Screen: NEGATIVE
Antibody Screen: NEGATIVE

## 2010-06-06 LAB — BASIC METABOLIC PANEL
BUN: <1 mg/dL — ABNORMAL LOW (ref 6–23)
BUN: 1 mg/dL — ABNORMAL LOW (ref 6–23)
CO2: 31 mEq/L (ref 19–32)
Calcium: 7 mg/dL — ABNORMAL LOW (ref 8.4–10.5)
Calcium: 7.5 mg/dL — ABNORMAL LOW (ref 8.4–10.5)
Calcium: 7.5 mg/dL — ABNORMAL LOW (ref 8.4–10.5)
Calcium: 8 mg/dL — ABNORMAL LOW (ref 8.4–10.5)
Calcium: 8.7 mg/dL (ref 8.4–10.5)
Calcium: 7.9 mg/dL — ABNORMAL LOW (ref 8.4–10.5)
Chloride: 105 mEq/L (ref 96–112)
Creatinine, Ser: 0.37 mg/dL — ABNORMAL LOW (ref 0.4–1.2)
Creatinine, Ser: 0.4 mg/dL (ref 0.4–1.2)
Creatinine, Ser: 0.5 mg/dL (ref 0.4–1.2)
Creatinine, Ser: <0.3 mg/dL — ABNORMAL LOW (ref 0.4–1.2)
GFR calc Af Amer: >60 mL/min (ref >60)
GFR calc Af Amer: >60 mL/min (ref >60)
GFR calc Af Amer: >60 mL/min (ref >60)
GFR calc non Af Amer: >60 mL/min (ref >60)
GFR calc non Af Amer: >60 mL/min (ref >60)
GFR calc non Af Amer: >60 mL/min (ref >60)
GFR calc non Af Amer: >60 mL/min (ref >60)
Glucose, Bld: 88 mg/dL (ref 70–99)
Glucose, Bld: 97 mg/dL (ref 70–99)
Potassium: 3.7 mEq/L (ref 3.5–5.1)
Potassium: 3.4 mEq/L — ABNORMAL LOW (ref 3.5–5.1)
Sodium: 133 mEq/L — ABNORMAL LOW (ref 135–145)
Sodium: 135 mEq/L (ref 135–145)
Sodium: 136 mEq/L (ref 135–145)
Sodium: 140 mEq/L (ref 135–145)
Sodium: 138 mEq/L (ref 135–145)

## 2010-06-06 LAB — COMPREHENSIVE METABOLIC PANEL
ALT: 14 U/L (ref 0–35)
ALT: 28 U/L (ref 0–35)
AST: 27 U/L (ref 0–37)
AST: 17 U/L (ref 0–37)
Albumin: 1.5 g/dL — ABNORMAL LOW (ref 3.5–5.2)
Albumin: 2.2 g/dL — ABNORMAL LOW (ref 3.5–5.2)
Alkaline Phosphatase: 77 U/L (ref 39–117)
Alkaline Phosphatase: 79 U/L (ref 39–117)
BUN: <1 mg/dL — ABNORMAL LOW (ref 6–23)
CO2: 27 mEq/L (ref 19–32)
Calcium: 9.4 mg/dL (ref 8.4–10.5)
Chloride: 108 mEq/L (ref 96–112)
Chloride: 96 mEq/L (ref 96–112)
Creatinine, Ser: 0.68 mg/dL (ref 0.4–1.2)
GFR calc Af Amer: >60 mL/min (ref >60)
GFR calc non Af Amer: >60 mL/min (ref >60)
Glucose, Bld: 75 mg/dL (ref 70–99)
Potassium: 3.1 mEq/L — ABNORMAL LOW (ref 3.5–5.1)
Potassium: 3.4 mEq/L — ABNORMAL LOW (ref 3.5–5.1)
Sodium: 134 mEq/L — ABNORMAL LOW (ref 135–145)
Sodium: 139 mEq/L (ref 135–145)
Total Bilirubin: 0.3 mg/dL (ref 0.3–1.2)
Total Bilirubin: 0.5 mg/dL (ref 0.3–1.2)
Total Protein: 4.3 g/dL — ABNORMAL LOW (ref 6.0–8.3)
Total Protein: 5.9 g/dL — ABNORMAL LOW (ref 6.0–8.3)

## 2010-06-06 LAB — COMPREHENSIVE METABOLIC PANEL WITH GFR
ALT: 24 U/L (ref 0–35)
AST: 38 U/L — ABNORMAL HIGH (ref 0–37)
Albumin: 1.8 g/dL — ABNORMAL LOW (ref 3.5–5.2)
Alkaline Phosphatase: 52 U/L (ref 39–117)
BUN: 3 mg/dL — ABNORMAL LOW (ref 6–23)
CO2: 28 meq/L (ref 19–32)
Calcium: 7.7 mg/dL — ABNORMAL LOW (ref 8.4–10.5)
Chloride: 98 meq/L (ref 96–112)
Creatinine, Ser: 0.47 mg/dL (ref 0.4–1.2)
GFR calc non Af Amer: >60 mL/min
Glucose, Bld: 108 mg/dL — ABNORMAL HIGH (ref 70–99)
Potassium: 3.6 meq/L (ref 3.5–5.1)
Sodium: 132 meq/L — ABNORMAL LOW (ref 135–145)
Total Bilirubin: 0.3 mg/dL (ref 0.3–1.2)
Total Protein: 4.3 g/dL — ABNORMAL LOW (ref 6.0–8.3)

## 2010-06-06 LAB — ABO/RH: ABO/RH(D): O POS

## 2010-06-06 LAB — URIC ACID: Uric Acid, Serum: 2.4 mg/dL (ref 2.4–7.0)

## 2010-06-06 LAB — URINALYSIS, ROUTINE W REFLEX MICROSCOPIC
Bilirubin Urine: NEGATIVE
Glucose, UA: NEGATIVE mg/dL
Hgb urine dipstick: NEGATIVE
Ketones, ur: NEGATIVE mg/dL
pH: 5.5 (ref 5.0–8.0)

## 2010-06-06 LAB — MRSA PCR SCREENING: MRSA by PCR: NEGATIVE

## 2010-06-14 ENCOUNTER — Encounter: Payer: Medicare Other | Attending: Physical Medicine & Rehabilitation

## 2010-06-14 ENCOUNTER — Encounter: Payer: Medicare Other | Admitting: Physical Medicine & Rehabilitation

## 2010-06-14 DIAGNOSIS — M47817 Spondylosis without myelopathy or radiculopathy, lumbosacral region: Secondary | ICD-10-CM

## 2010-06-28 NOTE — Procedures (Signed)
Emily Peck, Emily Peck              ACCOUNT NO.:  [id number]  MEDICAL RECORD NO.:  [id number]           PATIENT TYPE:  O  LOCATION:  [facility]PC                          FACILITY:  [facility]  PHYSICIAN:  Erick Colace, M.D.DATE OF BIRTH:  [birth year]  DATE OF PROCEDURE:  06/14/2010 DATE OF DISCHARGE:                              OPERATIVE REPORT  PROCEDURE:  Bilateral L3-L4 medial branch blocks and L5 dorsal ramus injection.  INDICATIONS:  Lumbar spondylosis with improvement of 4 months duration after medial branch blocks November 17, 2009.  Pain has recurred.  Her pain is only partially responsive to medication management including another conservative care.  Her Oswestry score is 52%.  Informed consent was obtained after describing risks and benefits of the procedure with the patient.  These include bleeding, bruising and infection.  She elects to proceed and has given written consent.  The patient placed prone on fluoroscopy table.  Betadine prep, sterile drape 25-gauge inch and half needle was used to anesthetize the skin and subcutaneous tissue 1% lidocaine 1.5 mL at each of six sites.  Then, a 22-gauge, 3-1/2-inch spinal needle was inserted under fluoroscopic guidance first starting at the left S1 SAP sacroiliac junction, bone contact made.  Omnipaque 180 x 0.5 mL demonstrated no intravascular uptake, followed by injection of 0.5 mL of solution containing 1 mL of 4 mg/mL dexamethasone and 4 mL of 2% MPF lidocaine.  Then, a left L5 SAP transverse process junction targeted, bone contact made.  Omnipaque 180 x 0.5 mL demonstrated no intravascular uptake.  Then, 0.5 mL of the dexamethasone lidocaine solution injected in the left L4, SAP transverse process junction targeted, bone contact made.  Omnipaque 180 x 0.5 mL demonstrated no intravascular uptake.  Then, 0.5 mL of the dexamethasone lidocaine solution was injected.  The same procedure was repeated on the right side using the same  needle injectate and technique.  The patient tolerated the procedure well.  Postprocedure instructions given.     Erick Colace, M.D. Electronically Signed    AEK/MEDQ  D:  06/14/2010 11:49:52  T:  06/15/2010 00:00:09  Job:  [id number]  cc:   Lubertha Basque. Jerl Santos, M.D. Fax: [phone]

## 2010-07-17 NOTE — Assessment & Plan Note (Signed)
OFFICE VISIT   Emily Peck, Emily Peck  DOB:  [birth year]                                        April 04, 2009  CHART #:  [id number]   The patient comes in today, and her x-ray showed no other active  disease.  There is a question of a small density in her left thorax.  She has had no fever or chills.  Overall, she is doing much better.  Her  pain is better.  Incisions are well healed.  I plan to see her back in 2  months again with a chest x-ray.  She apparently will get her brace off  in 2 weeks.  Her blood pressure is 114/80, pulse 96, respirations 18,  sats were 97%.   Ines Bloomer, M.D.  Electronically Signed   DPB/MEDQ  D:  04/04/2009  T:  04/05/2009  Job:  [id number]

## 2010-07-17 NOTE — Letter (Signed)
January 04, 2009   Estill Bamberg, MD  [address]  [place] [address]   Re:  Emily Peck, Emily Peck              DOB:  1955/11/20   Dear Dr. Yevette Edwards:   I appreciate the opportunity of seeing the patient.  This 55 year old  patient has had a T11 disk herniation with radicular pain as well as  pain radiating down the right leg.  She is referred here for exposure.   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   Her medication include Vicodin, Lexapro, and Coumadin.  She takes  Coumadin because she had TIA in the past.   She has had no allergies.   Family history is noncontributory.   SOCIAL HISTORY:  Single, has two children.  Does not smoke or drink  alcohol.   REVIEW OF SYSTEMS:  VITAL SIGNS:  She is 190 pounds.  She is 5 feet 6  inches.  GENERAL:  Her weight has been stable.  CARDIAC:  No angina or  atrial fibrillation.  PULMONARY:  No hemoptysis, asthma, or wheezing.  GI:  No nausea, vomiting, constipation, diarrhea.  GU:  No kidney  disease, dysuria, or frequent urination.  VASCULAR:  No claudication,  DVT, but has had previous TIA and small stroke.  NEUROLOGICAL:  No  dizziness, headaches, blackouts, or seizures.  MUSCULOSKELETAL:  No  arthritis and joint pain.  PSYCHIATRIC:  No depression or nervous.  EYE  AND ENT:  See history of present illness.  No change in her hearing.  HEMATOLOGICAL:  No problems with bleeding, clotting disorders, or anemia  but is on Coumadin.   PHYSICAL EXAMINATION:  VITAL SIGNS:  Her blood pressure is 120/78, pulse  68, respirations 18, sats were 99%.  HEAD, EYES, EARS, NOSE, AND THROAT:  On examination of her eyes she has  a right prosthesis and decreased vision on the left side, otherwise is  unremarkable.  NECK:  Supple without thyromegaly.  There is no supraclavicular or  axillary adenopathy.  CHEST:  Clear to auscultation and percussion.  HEART:  Regular sinus rhythm.  No murmurs.  ABDOMEN:  Obese.  Bowel sounds normal.  EXTREMITIES:  Pulses are 2+.  There is no clubbing or edema.  NEUROLOGIC:  She is oriented x3.  Sensory and motor intact.  Cranial  nerves are intact.    DICTATION ENDS HERE   Ines Bloomer, M.D.  Electronically Signed   DPB/MEDQ  D:  01/04/2009  T:  01/05/2009  Job:  [id number]

## 2010-07-17 NOTE — Assessment & Plan Note (Signed)
OFFICE VISIT   Emily Peck, Emily Peck  DOB:  24-May-1955                                        June 14, 2009  CHART #:  [id number]   Her blood pressure was 115/77, pulse 100, respirations 16, saturations  were 100%.  Her incision is well healed.  Her chest x-ray shows normal  postoperative changes.  She is doing well.  She still has some minimal  muscle spasms, but I referred her back to Dr. Yevette Edwards for long-term  followup.   Ines Bloomer, M.D.  Electronically Signed   DPB/MEDQ  D:  06/14/2009  T:  06/15/2009  Job:  [id number]
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
## 2010-07-17 NOTE — Letter (Signed)
February 21, 2009   Estill Bamberg, MD  [address]  [place], [place] [address]   Re:  MISTINA, COATNEY              DOB:  1955-06-02   Dear Dr. Yevette Edwards:   I saw the patient back today.  Her chest x-ray shows just a small left  pleural effusion and some reaction from her previous surgery.  Her blood  pressure was 122/88, pulse 86, respirations 18, sats were 96%.  The  patient informed me that you will be seeing her weekly because you are  somewhat worried about the rods or strut.  I hope that this goes ahead  and heals satisfactorily as she has had enough problems already.  She  complains of some swelling in her left upper quadrant, but I think this  is just related to the nerve injury from her thoracotomy.  Her lungs are  clear to auscultation and percussion.  I will plan to see her back again  in 6 weeks with a chest x-ray.   Ines Bloomer, M.D.  Electronically Signed   DPB/MEDQ  D:  02/21/2009  T:  02/22/2009  Job:  [id number]

## 2010-07-20 NOTE — Discharge Summary (Signed)
NAME:  Emily Peck, Emily Peck                        ACCOUNT NO.:  [id number]   MEDICAL RECORD NO.:  [id number]                   PATIENT TYPE:  INP   LOCATION:  3024                                 FACILITY:  [facility]   PHYSICIAN:  Casimiro Needle L. Thad Ranger, M.D.           DATE OF BIRTH:  02-05-1956   DATE OF ADMISSION:  03/31/2002  DATE OF DISCHARGE:  04/10/2002                                 DISCHARGE SUMMARY   ADMISSION DIAGNOSES:  1. Headache.  2. Abnormal magnetic resonant imaging.   DISCHARGE DIAGNOSES:  1. 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  Therapeutic on Coumadin.  2. Streptococcal pharyngitis group A with mild cervical lymphadenopathy     improved on intravenous antibiotics.  3. Neck pain.  Etiology uncertain, but likely related to 1 and 2 above.   CONDITION ON DISCHARGE:  Improved.   DIET:  Regular with Coumadin precautions.   ACTIVITY:  Ad lib.   DISCHARGE MEDICATIONS:  1. Coumadin 4 mg by mouth daily or as directed.  2. Flexeril 10 mg three times a day.  3. Percocet 5/325 one to two q.4-6h p.r.n.  #30.  No refills.  4. Pen-Vee K 500 mg one p.o. q.i.d. for 10 days.   CONSULTATIONS:  1. Dr. Raymond Gurney C. Magrinat, hematology.  2. Dr. Cliffton Asters, infectious disease.  3. Dr. Estill Bakes, rheumatology.   STUDIES:  1. CT of the head performed 03/31/02 demonstrated loss of sulcation in right     frontal parietal vertex suspicious for infarction in an atypical     territory.  2. 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  No definite acute evidence of trauma.  3. MRI of the brain performed 1/28 and 04/01/02 demonstrating abnormality of     the right parietal lobe with loss of sulci and gyriform gray matter     thickening diffuse positive without abnormal enhancement felt to  represent a venous infarction.  4. MRI of the cervical spine performed 03/31/02.  Mild disk degenerative     worse at C6-7 without evidence of spinal cord or neural elements and     mildly enlarged lymph nodes.  5. Cerebral angiogram, arterial venous stasis 04/02/02 demonstrating     attenuation filling defects in the anterior half of the superior sagittal     sinus and partially in the posterior one-third of the superior sagittal     sinus without arterial abnormalities.  No evidence of dissection.  6. Flexion extension films of cervical spine performed 04/04/02 negative for     fracture, subluxation or instability.  7. Chest x-ray 04/06/02.  Low volumes but no acute pulmonary process.  8. Lumbar puncture 03/31/02, minimally elevated pressure of 240, normal cell  counts of protein and glucose.   LABORATORY DATA:  CBC with repeatedly normal white cells, hemoglobin,  platelet counts.  Sedimentation rate 04/02/02 elevated at 102.  Sugar water  test negative.  Total protein C&S normal.  Functional protein-S borderline  low at 76.  Functional protein-C normal at 111.  Lupus anticoagulant  negative.  Routine chemistries unremarkable.  Serum LACE level normal.  Serum protein electrophoresis with nonspecific increased pattern suggesting  diffuse inflammation.  HIV negative.  C reactive protein elevated at 3.4.  CSF studies with unremarkable cell counts with protein and glucose.  CSF  cultures negative.  Strep A screen positive.  ANA negative.  Anticardiolipin  antibodies negative.  Beta 2  microglobulins negative.   HOSPITAL COURSE:  Please see admission history and physical on 03/31/02 for  full admission details.  Briefly, this is a 55 year old woman with little  medical history who presented to the emergency room  with progressive  worsening neck pain and subsequent posterior headache.  She had an abnormal  CT scan raising the possibility of stroke, possible subarachnoid hemorrhage.  She  underwent lumbar puncture which was unremarkable.  MRI of the brain and  cervical spine and CT of the cervical spine were performed with results as  above.  She also underwent angiography to exclude a vertebral dissection and  possible sinus thrombosis given that she might have had a venous stroke.  She was found to have a sinus thrombosis and anticoagulation was started at  that time.  She continued to complain of very severe neck pain which  required intravenous narcotics to relieve.  Hematology was consulted given  the sagittal sinus thrombosis and hypercoagulable workup was  obtained.  Because of her elevate sed rate, rheumatology consultation was obtained with  no specific recommendations.  Over the next couple of days, she developed  severe redness and swelling of the throat.  Rapid strep screen was positive.  Infectious disease consultation was obtained and she was given intramuscular  penicillin.  Subsequently, with the possibility that she may have a septic  sinus thrombosis, she was placed on intravenous Rocephin.  She was afebrile  throughout her hospitalization.  Over the next few days, she gradually began  to get better in terms of her neck pain and stiffness on a combination of  Flexeril, Vioxx and Dilaudid which was initially changed to Percocet which  was tolerated adequately.  Her anticoagulation reached the normal range and  was supratherapeutic at 3.4 at the time of discharge.  On the morning of  admission, she was still complaining of some neck pain and had some  stiffness on exam, but was less tender over the cervical and suboccipital  nodes with improving erythema of her pharynx.  She was felt to be in stable  condition and discharged home to complete course of oral antibiotics.    FOLLOW UP:  She was provided with prescription to have a prothrombin time  and INR performed on Tuesday 04/13/02 and will follow up with her regarding her dose about that.  She was asked to  call [facility] at [phone]  for an appointment with Dr. Thad Ranger and Demetrio Lapping, PA-C in two  weeks.  Followup with other consultants will be as needed.  Michael L. Thad Ranger, M.D.    MLR/MEDQ  D:  04/10/2002  T:  04/10/2002  Job:  [id number]   cc:   Areatha Keas, M.D.  [address]  [address]  [place]  [place] [address]  Fax: [phone]   Cliffton Asters, M.D.  [address] [place]  [place] [address]  Fax: [phone]

## 2010-07-20 NOTE — H&P (Signed)
NAME:  Emily Peck, Emily Peck                        ACCOUNT NO.:  [id number]   MEDICAL RECORD NO.:  [id number]                   PATIENT TYPE:  INP   LOCATION:  3024                                 FACILITY:  [facility]   PHYSICIAN:  Casimiro Needle L. Thad Ranger, M.D.           DATE OF BIRTH:  1955-04-14   DATE OF ADMISSION:  03/31/2002  DATE OF DISCHARGE:                                HISTORY & PHYSICAL   CHIEF COMPLAINT:  1. Headache.  2. Neck pain and stiffness.   HISTORY OF PRESENT ILLNESS:  This is the initial [facility]  admission for this 55 year old woman with medical history which includes  migraine, who reports subacute onset of neck pain and stiffness about six  days ago, which began spreading up into her head about three days ago.  This  pain was described as sharp and severe with a pulling sensation.  It  worsened with any head movement and especially with forward bending.  It  would also increase with squatting.  She came to the emergency room this  morning when the pain became unbearable, and she became unable to sleep.  She still complains of severe pain in spite of intravenous treatment with  Valium and Dilaudid.  She denies any history of previous pain like this.  She does have migraines, but they are not of this character.  She denies any  associated neurologic signs or symptoms and has had no recent injuries or  trauma.   MEDICAL HISTORY:  She reports a history of occasional migraine.  She has had  a retinal detachment in the past.  She denies chronic medical problems.   FAMILY HISTORY:  Remarkable for stroke in her father.   SOCIAL HISTORY:  Occasional the use of alcohol and smokes two to three  cigarettes a day.  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   ALLERGIES:  Denies.   MEDICATIONS:  Tylenol and ibuprofen p.r.n.   REVIEW OF SYSTEMS:  CONSTITUTIONAL:  Denies fever, chills.  EYES:  She does  report transient visual obscurations but  denies diplopia.  EARS, NOSE, AND  THROAT:  She had a cold about a month ago.  RESPIRATORY:  No cough,  shortness of breath.  CARDIOVASCULAR:  No chest pains, palpitations.  GASTROINTESTINAL:  Some nausea, no vomiting.  GENITOURINARY:  No dysuria.  SKIN:  No rash.  MUSCULOSKELETAL:  Neck pain as above, no pain in other  joints.  NEUROLOGIC:  No focal symptoms of numbness, weakness, or  paresthesias.   PHYSICAL EXAMINATION:  VITAL SIGNS:  Afebrile, blood pressure 108/63, pulse  72, respirations 16.  GENERAL EXAMINATION:  She is alert but does looked distressed.  HEAD:  Cranium is normocephalic and atraumatic.  Oropharynx is benign.  NECK:  There is slight limitation to flexion with marked muscle tightness.  There is no carotid or supraclavicular bruit.  CHEST:  Clear to auscultation.  HEART:  Regular rate and rhythm without murmurs.  ABDOMEN:  Normal active bowel sounds, no masses palpated.  EXTREMITIES:  No edema.  Pulses 2+.  NEUROLOGIC EXAM:  Mental status:  She is awake and alert.  She is able to  answer questions adequately.  She looks somewhat distressed due to pain.  She is fully oriented.  Speech is fluent and not dysarthric.  She has no  defects to __________  naming. Cranial nerves:  Ophthalmic  exam reveals an  old right retinal detachment.  She has poor visual fields throughout the  periphery, worse in the left lower.  Extraocular movements normal without  nystagmus.  Pupils are equal and briskly reactive.  Face sensation is intact  to pinprick.  Face, tongue and palate move was normally and symmetrically.  Hearing is intact to finger rub.  Shoulder shrug strength was normal.  MOTOR TESTING:  Normal bulk and tone, normal strength in all tests of  extremity muscles.  Sensation intact to pinprick and light touch in all  extremities.  Reflexes 2+ and symmetric.  Toes are downgoing.  Cerebellar:  Finger-to-nose and heel-to-shin performed well.  Gait:  She was able to walk  a  little bit tentatively.  She feels unsteady when she attempts to toe or  tandem walk.   LABORATORY REVIEW:  CBC:  White cells 9.5, hemoglobin 12.3, platelets  289,000.  Coagulation tests are normal.  BUN is unremarkable.  CT of the  head is personally reviewed.  There appears to be some swelling of the gyri  on the right in the high frontoparietal area.  There is no definite mass  effect or bleeding.  LP is performed with no white cells and one red cell in  tube one; two white cells and no red cells in tube four; a normal protein  and glucose; negative Gram stain.  MRI of the cervical spine demonstrates no  significant abnormalities.  MRI of the brain does confirm an abnormality in  the high frontoparietal area; question a venous stroke versus focal area of  swelling versus possible subarachnoid bleed.   IMPRESSION:  Abnormal MRI with a headache.  It is a little difficult to put  together the symptoms and the findings, but it may well be that she has had  a venous stroke.  It is also possible this is a small area of focal  inflammation or tumor.  Unfortunately the patient declined a contrast MRI.   PLAN:  1. Admit for further work up.  2. Contrast MRI of the brain in the morning.  3. Four-vessel cerebral angiogram in the morning.                                               Michael L. Thad Ranger, M.D.    MLR/MEDQ  D:  03/31/2002  T:  03/31/2002  Job:  [id number]

## 2010-07-20 NOTE — Consult Note (Signed)
NAME:  Emily Peck, Emily Peck                        ACCOUNT NO.:  [id number]   MEDICAL RECORD NO.:  [id number]                   PATIENT TYPE:  INP   LOCATION:  3024                                 FACILITY:  [facility]   PHYSICIAN:  Valentino Hue. Magrinat, M.D.            DATE OF BIRTH:  Feb 12, 1956   DATE OF CONSULTATION:  04/03/2002  DATE OF DISCHARGE:                                   CONSULTATION   HISTORY OF PRESENT ILLNESS:  The patient is a 55 year old [place] woman  referred by Dr. Thad Ranger in the setting of sagittal sinus thrombosis.   The patient presented to the emergency room on January 28 complaining of  severe neck pain and headache.  Evaluation by  Dr. Thad Ranger at that time was significant chiefly for decreasing the  peripheral visual fields, but the pupils were equal and reactive, the  extraocular movements normal.  There was no nystagmus and there was no motor  or sensory level.  There was some unsteadiness to tandem walk.  He performed  a lumbar puncture which showed a glucose of 52 and total protein of 22.  There was one red cell in tube 1, none in tube 4, no white cells in tube 1,  two in tube 4.  Gram's stain was negative.   Other studies have included CTs of the brain and neck, noncontrast and  contrast MRI of the brain, and finally a cerebral arteriogram January 30,  which showed a sagittal sinus thrombus.  The other abnormality noted by neck  CT and MRI in addition to some arthritic changes are possible Klippel-Feil  malformation (about which I am not well informed).  There was specifically  no evidence of dissection, no evidence of subarachnoid hemorrhage.   Dr. Thad Ranger has already sent a battery of tests and the specific question  in this consult was:  Are there any other tests we ought to be sending at  this point?   PAST MEDICAL HISTORY:  1. Migraines.  2. History of retinal detachment.  3. History of ovarian cysts:  This brought the patient to the emergency  room     January of 2002, when CTs of the abdomen and pelvis were obtained.  4. History of rash secondary to food allergy, unspecified (December 2003).  5. Status post hysterectomy approximately three years ago, without salpingo-     oophorectomy for what sounds like fibroids and menorrhagia.  6. History of hemorrhoids precipitating colonoscopy approximately six or     seven years ago (she does not recall the GI physician's name).   FAMILY HISTORY:  The patient's father is alive and as far as the patient  knows, in good health.  She is not sure that he has had a stroke.  The  patient's mother is alive at 58.  She moved here from [place] to be with  the patient and lives with the patient currently.  There is an  unclear  history regarding my mother's legs turning black.  Note that the patient's  mother does not have a history of diabetes.  Apparently there was no  arterial occlusion since she did not have bypass surgery.  The patient's  mother is very heavy and uses a walker, but most of the time she is in a  wheelchair.  Clearly, I do not have an undisputable history of clotting in  the family.  The patient has six siblings, including half siblings, with no  history of clotting to the best of her knowledge.   GROSS FINDINGS:  She is G2, F2, P0, A0, L2.  Again no clotting abnormalities  during these pregnancies.  She has occasional problems with hot flashes.   SOCIAL HISTORY:  She works full time at a day care center.  She lives with  her mother, who is at least partially disabled as noted above, and two  grandchildren, aged 2 and 7.  Her two children are Alinda Money, a son who is a  Teaching laboratory technician, and Thayer Ohm, another son who is in Holiday representative.   HEALTH MAINTENANCE:  The patient has never had a mammogram.  She had a  colonoscopy more than five years ago for what proved to be hemorrhoidal  bleeding.  Use of alcohol is occasional and there is no history of abuse.  She rarely smokes  cigarettes, perhaps 1-3 a week.  She does not receive flu  shots, has never had a Pneumovax, has never had a blood transfusion.  She  does not have a health care power of attorney.   ALLERGIES:  No known drug allergies.   MEDICATIONS:  At home her only medications were Tylenol and Advil on a  p.r.n. basis.   REVIEW OF SYSTEMS:  She has a long time history of constipation, having a  bowel movement once a week or sometimes once every two weeks.  The neck pain  is improved on narcotics, but she is a little bit groggy from this and  overall she is not well.  She denies any history of clotting or for that  matter, bleeding problems, either during her pregnancy or during her  hysterectomy. She denies sudden visual changes, diarrhea, constipation,  bright red blood per rectum, melena, palpitations, chest pain or pressure,  cough on production, pleurisy, shortness of breath.   PHYSICAL EXAMINATION:  GENERAL:  She is a middle-aged black female.  VITAL SIGNS:  On admission she had a temperature of 100.2 degrees, was then  briefly on steroids, but currently off steroids, has a temperature of 98.2.  Heart rate 94, respiratory rate 20, blood pressure 126/80.  Room air  saturation is 97%.  HEENT:  The pupils are equal, the sclerae anicteric.  Oropharynx clear.  NECK:  Supple.  I do not palpate any cervical, supraclavicular, or axillary  adenopathy.  BREASTS:  Shows no suspicious masses.  LUNGS:  No crackles or wheezes.  HEART:  Regular rate and rhythm.  No murmur appreciated.  ABDOMEN:  Soft, nontender.  Bowel sounds positive.  EXTREMITIES:  No focal spinal tenderness.  No peripheral edema.  NEUROLOGIC:  Nonfocal.  There is a detailed neurologic exam by Dr. Thad Ranger  done on admission.   LABORATORY DATA:  Labs on admission showed a white cell count of 9.5, a  hemoglobin of 12.3, and a platelet count of 389,000 with an unremarkable differential.  The patient had a sed rate of 102 on January 30  and an  elevated C-reactive protein at  3.4.  Initial coags were normal with a PT of  13, PTT of 28 seconds.  Creatinine was 0.5, calcium 9.6.  HIV was negative.  The patient also has a normal antithrombin 3.  Multiple other tests are  pending at this time including an ANA, an ACE level, protein C&S studies,  lupus anticoagulant, and anticardiolipin antibodies test.   Films as discussed above.  I was unable to locate the actual films for  review.   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   Dr. Thad Ranger' note delineates the differential quite extensively and he has  already sent the majority of relevant tests.  Basically, I think in the  absence of any history of trauma by history or physical, we are left with  two basic categories.  One of them is vasculitis,  I do not think this is  going to be the case.  It would be extremely rare to have an absolutely  normal complete blood count in the setting of vasculitis (one would expect  the hemoglobin to be down, the white cells to be up for example).  Nevertheless, I agree with the ANA and ACE levels and I have added in  addition an ANCA and rheumatoid factor levels, although I expect these to be  negative.   The more likely probability is a hypercoagulable state.  It is best to think  of these in three separate categories:  1. Congenital.  AT3 protein CM, protein S levels have already been sent.  I     have added the factor 5 Leiden level, even though this is significantly     less common among American blacks and whites; I have also added a     prothrombin gene mutation and a homocystine level.  I doubt that the     patient has paroxysmal nocturnal hemoglobinuria, but I have also sent a     sugar water test just to screen for that.  2. Among acquired conditions the most common one is the  antiphospholipid     syndrome and  Dr. Thad Ranger has already sent a lupus anticoagulant test and anticardiolipin  antibodies.  Note that they are significant questions in the coag task force  (of which I am a member), whether the current lupus anticoagulant test is  not too sensitive (and the prior test was certainly not sensitive enough).  I have sent beta 2 glycoprotein antibodies as well, which may serve as  confirmation if the LAC is positive.  The antiphospholipid syndrome does  remain a real possibility in this case.  1. Neoplastic.  By history and physical, I do not find any suggestion of an     occult malignancy in this patient, plus she has never had a mammogram and     that certainly ought to be done in any case as an outpatient.  Her stools     should be guaiaced even though the fact that she is currently     anticoagulated may result in foul false positives.  I remain puzzled as to the overall picture and will await the results of the  above tests before making further suggestions, except to note that with  unprovoked venous clotting of such a life threatening nature, long term and  possibly lifelong anticoagulation is warranted.  Valentino Hue. Magrinat, M.D.    Ronna Polio  D:  04/03/2002  T:  04/03/2002  Job:  [id number]   cc:   Casimiro Needle L. Thad Ranger, M.D.  [address]  [address]  [place]  [place] [address]  Fax: [phone]   Kathreen Cosier, M.D.  [address]  [place]  [place] [address]  Fax: [phone]   Deniece Ree, M.D.

## 2010-07-20 NOTE — Op Note (Signed)
Emily Peck, Emily Peck              ACCOUNT NO.:  [id number]   MEDICAL RECORD NO.:  [id number]          PATIENT TYPE:  AMB   LOCATION:  DSC                          FACILITY:  [facility]   PHYSICIAN:  Lubertha Basque. Dalldorf, M.D.DATE OF BIRTH:  1955-07-26   DATE OF PROCEDURE:  01/28/2006  DATE OF DISCHARGE:                               OPERATIVE REPORT   PREOPERATIVE DIAGNOSIS:  Left carpal tunnel syndrome.   POSTOPERATIVE DIAGNOSIS:  Left carpal tunnel syndrome.   PROCEDURE:  Left carpal tunnel release.   ANESTHETIC:  Bier block MAC.   ATTENDING SURGEON:  Dr. Jerl Santos   ASSISTANT:  Carnaghi, PA   INDICATIONS FOR PROCEDURE:  The patient is a 55 year old woman with a  long history of bilateral hand pain and numbness.  This has persisted  despite oral anti-inflammatories and bracing.  She has undergone nerve  conduction study which does show severe left-sided carpal tunnel  syndrome.  She has pain which limits her ability to rest and numbness  which limits her ability to use her hand.  She is offered a carpal  tunnel release.  Informed operative consent was obtained after  discussion of possible complications of reaction to anesthesia,  infection, and neurovascular injury.   SUMMARY/FINDINGS/PROCEDURE:  Under Bier block and MAC through a small  palmar incision a carpal tunnel release was performed.  She did have  some moderate thickening of the transverse carpal ligament and obvious  compression of the median nerve.   DESCRIPTION OF PROCEDURE:  The patient was taken to operating suite  where Bier block was applied to the level of her forearm.  She also  given some sedation.  She was positioned supine and prepped, draped in  normal sterile fashion.  After administration of preoperative IV Kefzol  a small palmar incision was made ulnar to the thenar flexion crease.  This did not cross the wrist flexion crease.  Dissection was carried  down through the palmar fascia to expose the  transverse carpal ligament.  This was released under direct visualization.  The release was taken  distally to the transverse arch of vessels and proximally through the  distal fascia of the forearm.  This was a fairly thickened ligament  applying some compression to the underlying median nerve.  The wound was  irrigated followed by reapproximation of her skin with vertical  mattresses of nylon.  Adaptic was applied.  The tourniquet was deflated  and a small amount of bleeding was controlled with some pressure.  Fingertips became pink and warm immediately.  Dry gauze was applied  followed by a volar splint of plaster with the wrist in slight  extension.  Estimated blood loss and intraoperative fluids can be  obtained from anesthesia records as can accurate tourniquet time.   DISPOSITION:  The patient was taken to recovery room in stable addition.  She was to go home same-day and follow up in the office in less than a  week.  I will contact her by phone tonight.      Lubertha Basque Jerl Santos, M.D.  Electronically Signed     PGD/MEDQ  D:  01/28/2006  T:  01/28/2006  Job:  [id number]

## 2010-08-09 ENCOUNTER — Ambulatory Visit: Payer: Medicare Other | Admitting: Physical Medicine & Rehabilitation

## 2010-08-10 IMAGING — CT CT T SPINE W/O CM
3 of 8 series · 12 of 36 positions shown, 13 images · non-contrast
Comparison: CT of the thoracic spine 01/17/2009.

Addendum Begins

The use of the terms side ends in reference to the inferior plate
of T9 refers to the fact that the graft extends above the level of
the inferior endplate by 2 mm.  This may be the way the graft was
originally placed.  There is no evidence for movement of the graft.
The case was discussed with Dr. Rasbot on 01/23/2009.
Addendum Ends
CLINICAL DATA: Status post revision of spinal fusion.  Corpectomy
at T10 and T11.
CT THORACIC SPINE WITHOUT CONTRAST
TECHNIQUE: Multidetector CT imaging of the thoracic spine was
performed without intravenous contrast administration. Multiplanar
CT image reconstructions were also generated

[Series 2: t spine · axial · 0.52mm/px · z∈[-366,-38]mm · 3 of 131 slices shown, 4 images]
[im 1/131  soft-tissue]
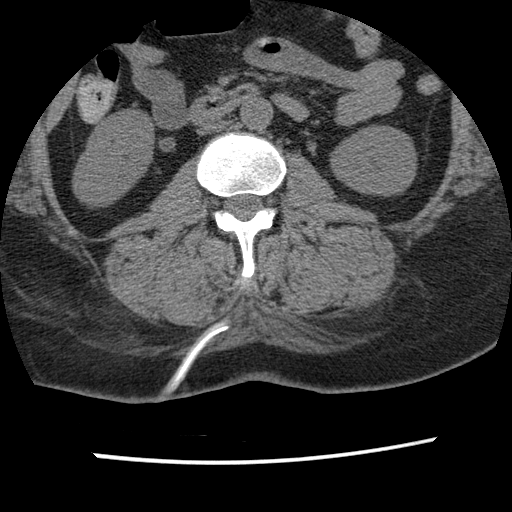
[im 1/131  bone]
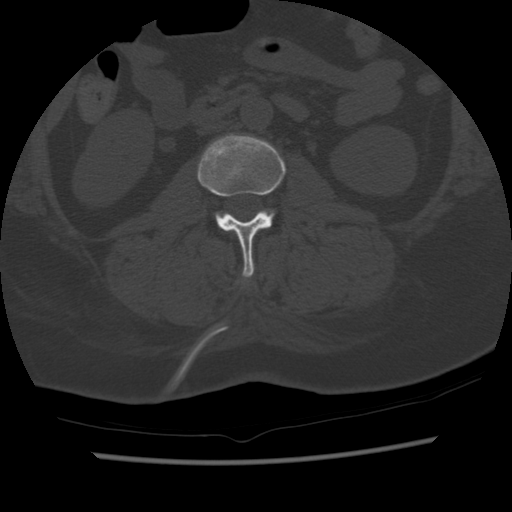
[im 66/131  bone]
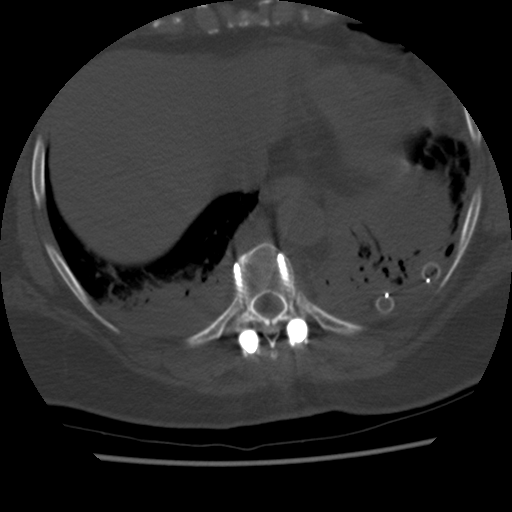
[im 131/131  bone]
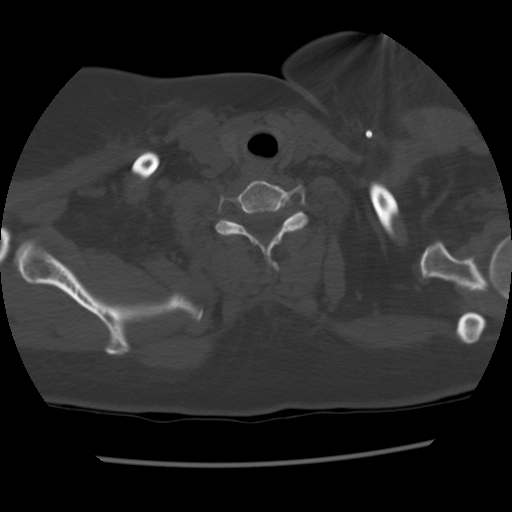

[Series 401: reformatted · coronal · 0.66mm/px · 6 of 59 slices shown (1 of 2)]
[im 14/59  bone]
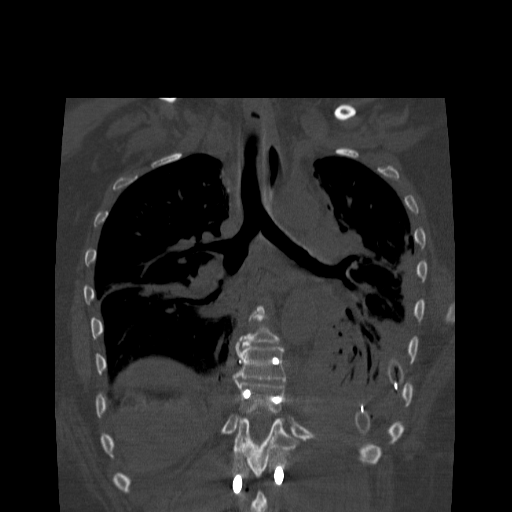
[im 17/59  bone]
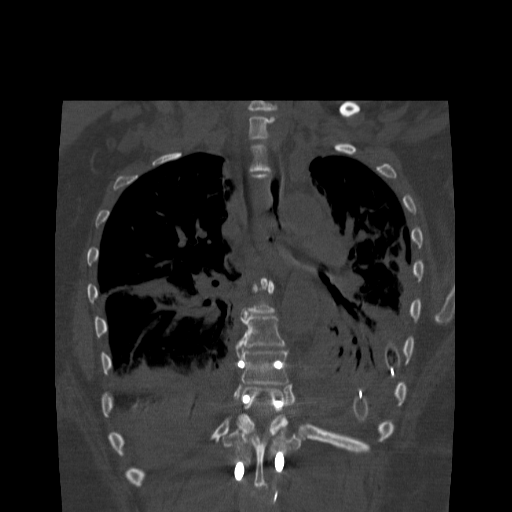
[im 20/59  bone]
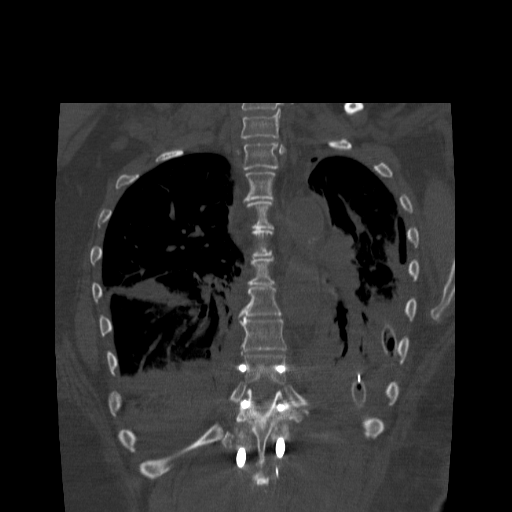
[im 23/59  bone]
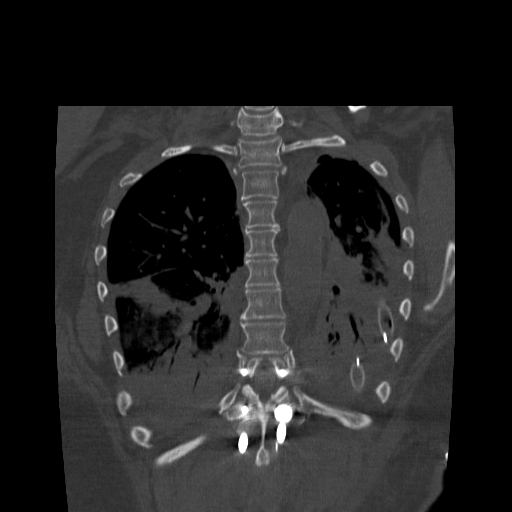
[im 26/59  bone]
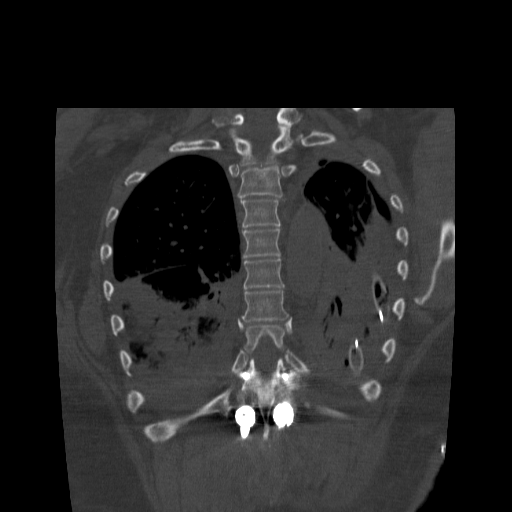
[im 30/59  soft-tissue]
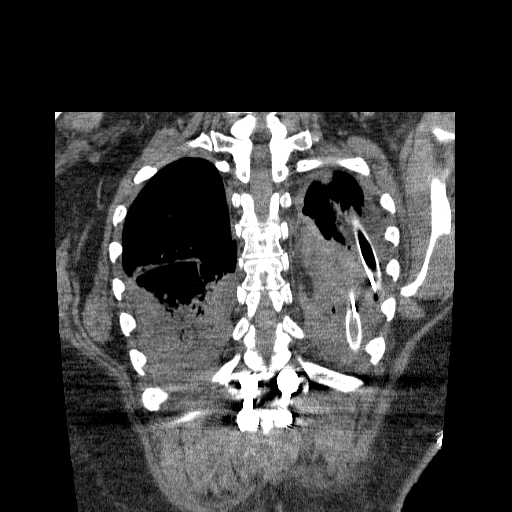

[Series 402: reformatted · coronal · 0.66mm/px · 3 of 66 slices shown (2 of 2)]
[im 21/66  bone]
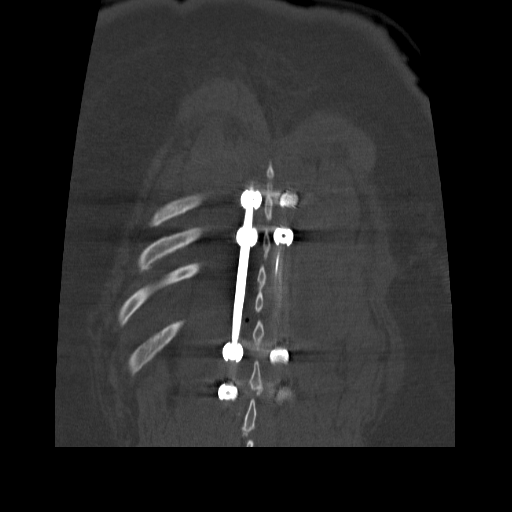
[im 32/66  bone]
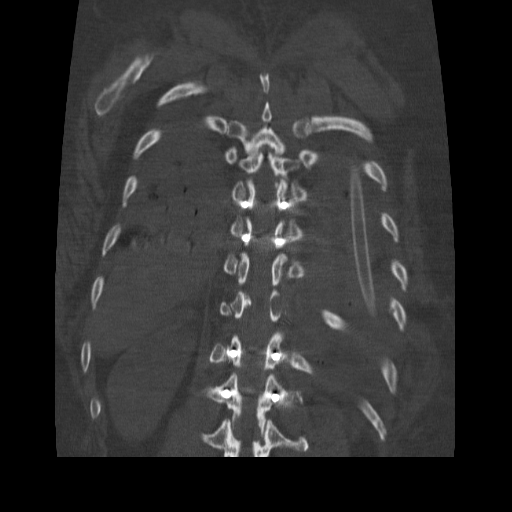
[im 43/66  bone]
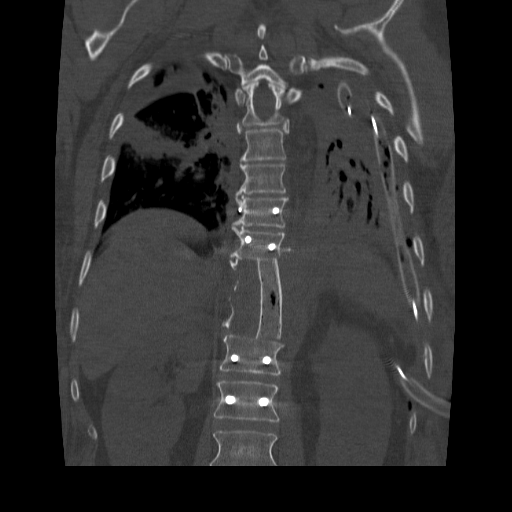

[12 of 36 positions shown; findings below may reference images not displayed]

FINDINGS: Two left-sided chest tubes are in place.  There is some
pleural air persisting.  Bilateral pleural effusions and airspace
disease are present.  Disease at the lung bases bilaterally is
concerning for pneumonia or aspiration.

The patient is now status post near total corpectomy at T10 and
T11.  There is a bone graft extending from the inferior aspect of
T9 to the superior endplate of T12.  There is slight subsidence of
the graft at the inferior endplate of T9, measuring 3-4 mm.
Pedicle screw and rod fixation is present from T8-L1.  There are
bilateral pedicle screws at both T12 and L1 as well as at T8 and
T9.  The previously noted bone fragment along the right pedicle of
T11 is stable.  There is right foraminal narrowing at T9-10 and T10-
11.  Left foraminal narrowing is present at T10-11.  The right
foraminal narrowing at T9-10 has progressed.  The hardware is
intact.  Alignment is otherwise anatomic.  No significant disease
is present above or below the level.
IMPRESSION: 1.  Interval corpectomy with a strut graft extending from the
inferior endplate of T9 through the superior endplate of T12.
2.  Mild subsidence at the inferior plate of T9.
3.  Stable appearance of bone fragment or osteophyte along the
medial aspect of the T11 pedicle.
4.  Bilateral pedicle screw and rod fixation from T8-L1.
5.  Bilateral lower lobe airspace disease concerning for pneumonia
or aspiration.
6.  Two left-sided chest tubes in place with some pleural air
remaining.

## 2010-08-11 IMAGING — CR DG CHEST 1V PORT
1 series · 1 of 1 positions shown · non-contrast
Comparison: 01/19/2009

CLINICAL DATA: Respiratory distress

PORTABLE CHEST - 1 VIEW

[AP]
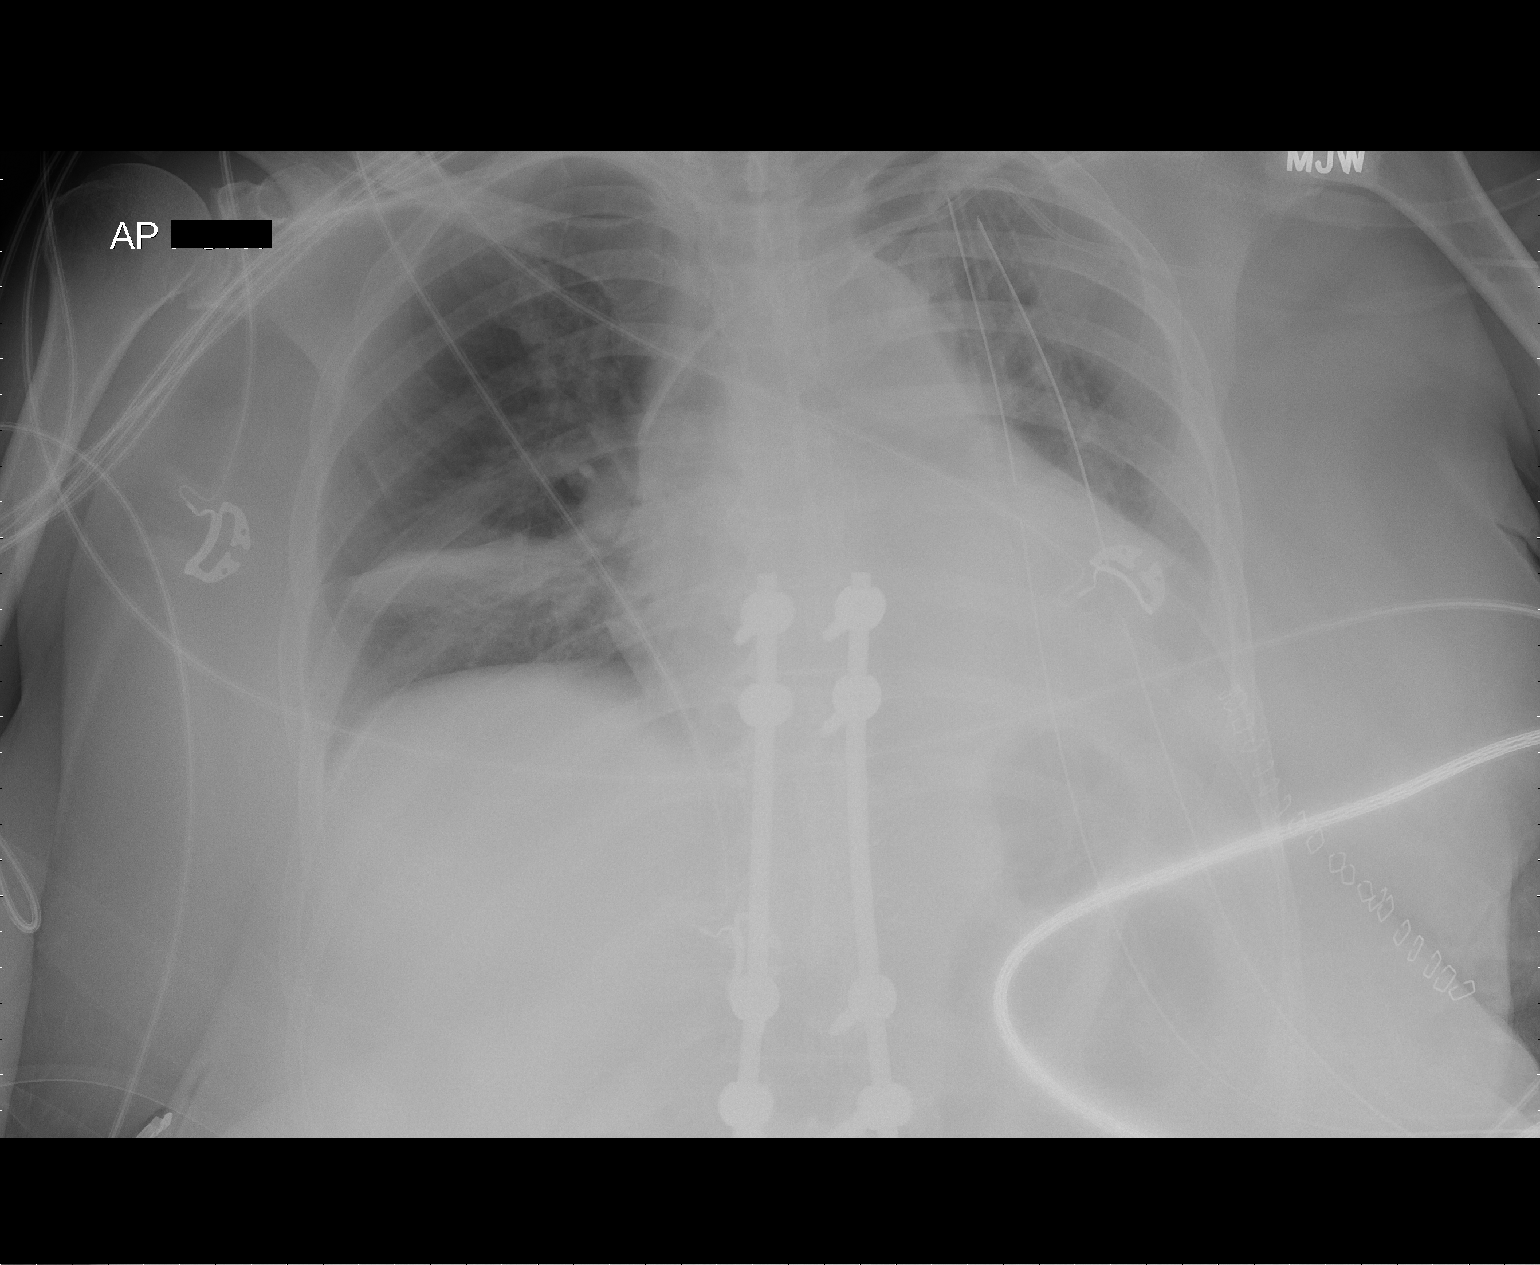

[1 of 1 positions shown; findings below may reference images not displayed]

FINDINGS: Cardiomediastinal silhouette is stable.  Metallic rods
thoracolumbar spine again noted.  2 left chest tubes are unchanged
in position.  There is no diagnostic pneumothorax.  Stable left
subclavian central line position.  There is mild improvement in
aeration right lower lobe with persistent residual atelectasis or
infiltrate.  No pulmonary edema.
IMPRESSION: Stable left chest tubes position.  Stable left subclavian central
venous line position. No convincing edema.  Mild improvement in
aeration with persistent right lower lobe atelectasis or
infiltrate.

## 2010-08-14 IMAGING — CR DG CHEST 1V PORT
1 series · 1 of 1 positions shown · non-contrast
Comparison: 01/22/2009

CLINICAL DATA: Postop transthoracic approach for T10 cage revision.

PORTABLE CHEST - 1 VIEW

[view not recorded]
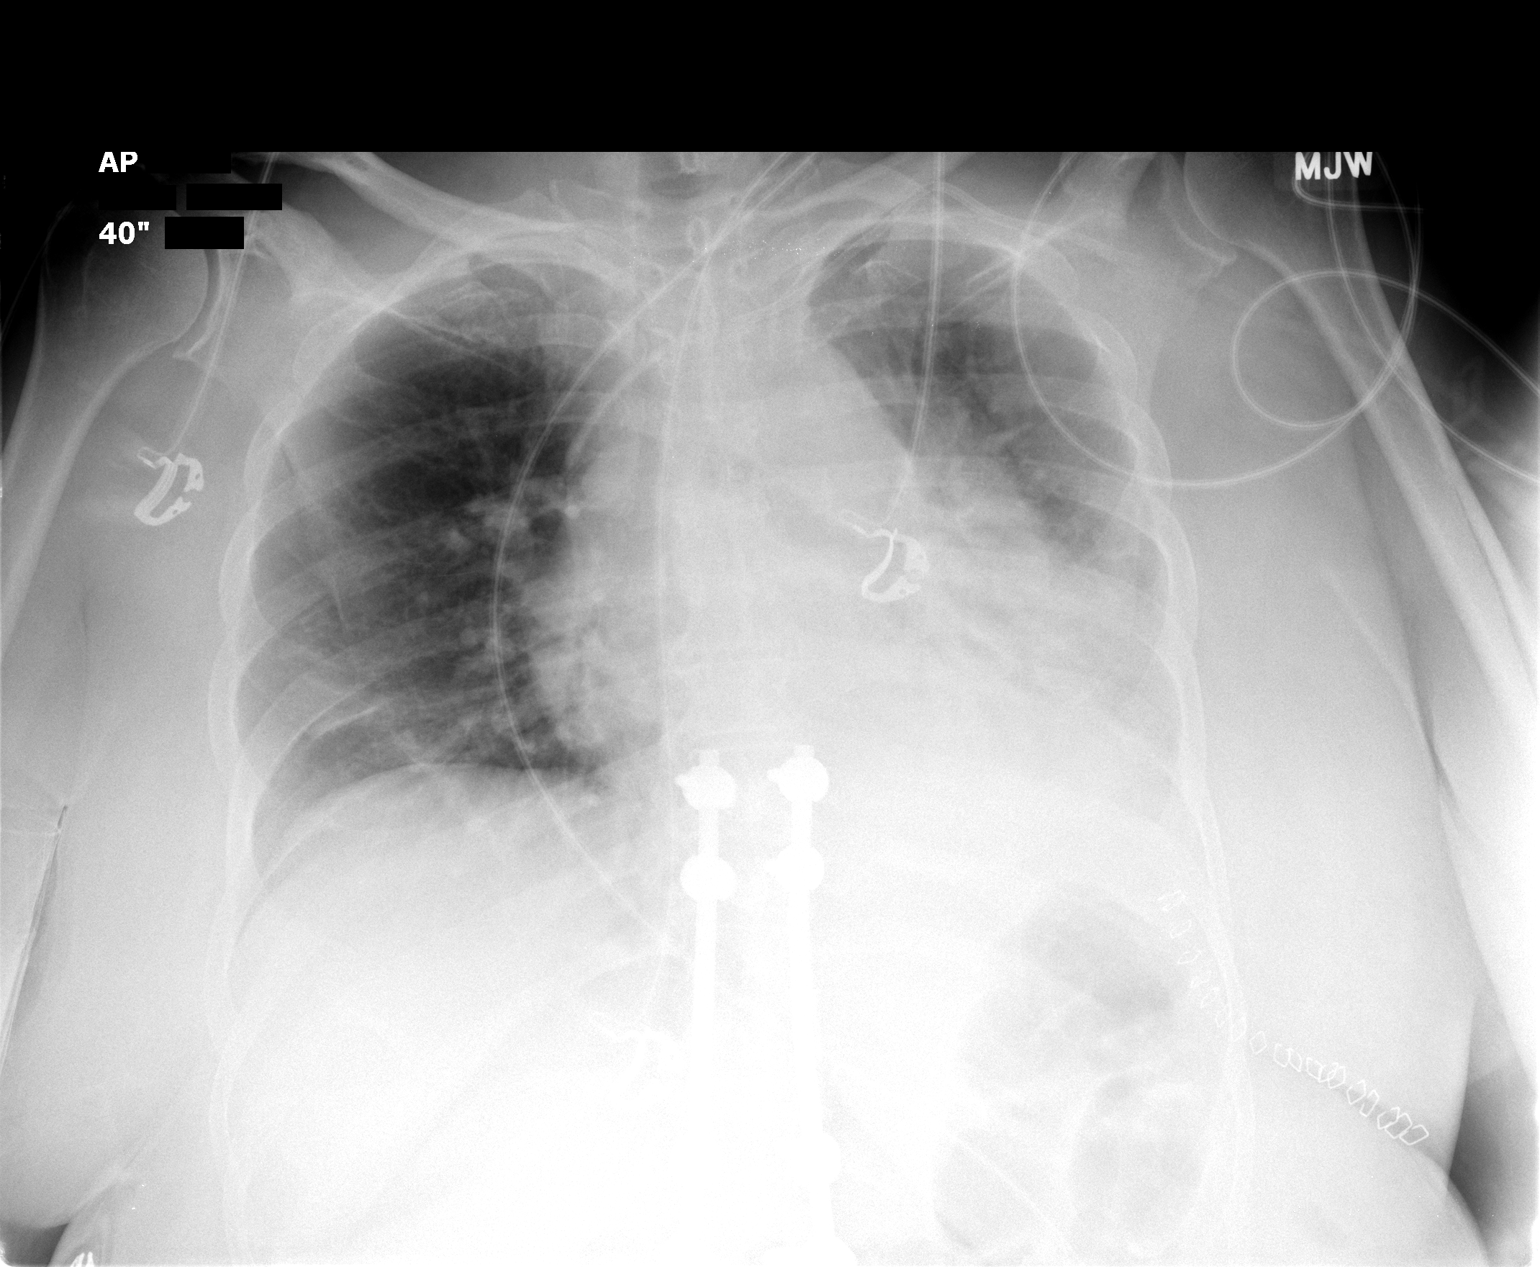

[1 of 1 positions shown; findings below may reference images not displayed]

FINDINGS: Trachea is midline.  Heart size stable.  Left upper and
left lower lobe air space disease persists.  Minimal right basilar
atelectasis.  Left subclavian central line tip projects over the
SVC.  Thoracolumbar spinal hardware and surgical skin staples
projecting over the left upper quadrant are noted.
IMPRESSION: Persistent left upper and left lower lobe air space disease.

## 2010-08-16 ENCOUNTER — Ambulatory Visit: Payer: Medicare Other | Admitting: Physical Medicine & Rehabilitation

## 2010-09-10 ENCOUNTER — Ambulatory Visit: Payer: Medicare Other | Admitting: Physical Medicine & Rehabilitation

## 2010-09-27 ENCOUNTER — Encounter: Payer: Medicare Other | Attending: Physical Medicine & Rehabilitation

## 2010-09-27 ENCOUNTER — Ambulatory Visit: Payer: Medicare Other | Admitting: Physical Medicine & Rehabilitation

## 2010-09-27 DIAGNOSIS — Z7901 Long term (current) use of anticoagulants: Secondary | ICD-10-CM | POA: Insufficient documentation

## 2010-09-27 DIAGNOSIS — M961 Postlaminectomy syndrome, not elsewhere classified: Secondary | ICD-10-CM | POA: Insufficient documentation

## 2010-09-27 DIAGNOSIS — M47817 Spondylosis without myelopathy or radiculopathy, lumbosacral region: Secondary | ICD-10-CM | POA: Insufficient documentation

## 2010-09-27 NOTE — Procedures (Signed)
NAMEGEORGIANNE, Emily Peck              ACCOUNT NO.:  [id number]  MEDICAL RECORD NO.:  [id number]           PATIENT TYPE:  O  LOCATION:  [facility]                          FACILITY:  [facility]  PHYSICIAN:  Erick Colace, M.D.DATE OF BIRTH:  [birth year]  DATE OF PROCEDURE:  09/27/2010 DATE OF DISCHARGE:                              OPERATIVE REPORT  PROCEDURE:  Bilateral L5 dorsal ramus injection, bilateral L3 and L4 medial branch blocks under fluoroscopic guidance.  INDICATION:  Lumbar spondylosis.  Post laminectomy syndrome.  She has had good relief with previous medial branch blocks in the lower lumbar area, last one performed on June 14, 2010, but now has worn off.  She is on Coumadin, but has come off of it for greater than 5 days, her last protime was 1.12, which was done 48 hours ago and has not taken any more Coumadin since that time.  Informed consent was obtained after describing risks and benefits of the procedure with the patient.  These include bleeding, bruising, and infection.  She elects to proceed and has given written consent.  The patient was placed prone on fluoroscopy table.  Betadine prep, sterile drape.  A time-out for correct level, this is a bilateral procedure.  Betadine prep and sterile drape.  Lidocaine 1% x 1.5 mL at each of 6 sites followed by insertion of 22 gauge 3-1/2 inch spinal needle first starting left S1 SAP sacral ala junction, bone contact made.  Omnipaque 180 x 0.5 mL demonstrated no intravascular uptake followed by injection of 0.5 mL of solution containing 1 mL of 4 mg/mL of dexamethasone and 3 mL of 2% MPF lidocaine.  Then, left L5 SAP transverse process junction targeted, bone contact made.  Omnipaque 180 x 0.5 mL demonstrated no intravascular uptake.  Then, 0.5 mL of the dexamethasone-lidocaine solution was injected.  Then, left L4 SAP transverse process junction targeted, bone contact made.  Omnipaque 180 x 0.5 mL demonstrated no intravascular  uptake.  Then, 0.5 mL of dexamethasone-lidocaine solution was injected.  The same procedure was performed on the right side using same needle injectate and technique.  The patient tolerated the procedure well.  Pre- and post injection vitals stable.  Post injection instructions given.     Erick Colace, M.D. Electronically Signed    AEK/MEDQ  D:  09/27/2010 14:12:53  T:  09/27/2010 15:18:49  Job:  [id number]

## 2010-11-01 ENCOUNTER — Ambulatory Visit: Payer: Medicare Other | Admitting: Physical Medicine & Rehabilitation

## 2010-11-08 ENCOUNTER — Ambulatory Visit: Payer: Medicare Other | Admitting: Physical Medicine & Rehabilitation

## 2010-12-07 LAB — BASIC METABOLIC PANEL
BUN: 12
CO2: 23
Calcium: 9.2
Creatinine, Ser: 0.91
GFR calc Af Amer: >60

## 2010-12-07 LAB — CBC
HCT: 35 — ABNORMAL LOW
MCV: 96.1
RBC: 3.64 — ABNORMAL LOW
RDW: 14.6

## 2010-12-07 LAB — D-DIMER, QUANTITATIVE: D-Dimer, Quant: 0.22

## 2010-12-07 LAB — DIFFERENTIAL
Lymphocytes Relative: 20
Lymphs Abs: 2.3
Neutrophils Relative %: 71

## 2010-12-07 LAB — CK TOTAL AND CKMB (NOT AT ARMC): Relative Index: 1.2

## 2010-12-07 LAB — TROPONIN I: Troponin I: 0.01

## 2010-12-07 LAB — PROTIME-INR: Prothrombin Time: 13.6

## 2010-12-19 LAB — PROTIME-INR: Prothrombin Time: 32.8 — ABNORMAL HIGH

## 2010-12-27 ENCOUNTER — Encounter: Payer: Medicare Other | Admitting: Physical Medicine & Rehabilitation

## 2011-01-03 ENCOUNTER — Observation Stay (HOSPITAL_COMMUNITY)
Admission: EM | Admit: 2011-01-03 | Discharge: 2011-01-04 | DRG: 103 | Disposition: A | Discharge status: Home / Self Care | Payer: Worker's Compensation | Source: Ambulatory Visit | Attending: Emergency Medicine | Admitting: Emergency Medicine

## 2011-01-03 DIAGNOSIS — W19XXXA Unspecified fall, initial encounter: Secondary | ICD-10-CM | POA: Insufficient documentation

## 2011-01-03 DIAGNOSIS — M79609 Pain in unspecified limb: Principal | ICD-10-CM | POA: Insufficient documentation

## 2011-01-03 DIAGNOSIS — S8010XA Contusion of unspecified lower leg, initial encounter: Secondary | ICD-10-CM | POA: Insufficient documentation

## 2011-01-03 LAB — CBC
MCH: 32.5 pg (ref 26.0–34.0)
MCV: 95.6 fL (ref 78.0–100.0)
Platelets: 266 10*3/uL (ref 150–400)
RBC: 3.63 MIL/uL — ABNORMAL LOW (ref 3.87–5.11)

## 2011-01-03 LAB — DIFFERENTIAL
Basophils Absolute: 0 10*3/uL (ref 0.0–0.1)
Basophils Relative: 0 % (ref 0–1)
Eosinophils Absolute: 0.3 10*3/uL (ref 0.0–0.7)
Eosinophils Relative: 4 % (ref 0–5)
Monocytes Absolute: 0.5 10*3/uL (ref 0.1–1.0)

## 2011-01-03 LAB — BASIC METABOLIC PANEL
BUN: 7 mg/dL (ref 6–23)
Chloride: 107 mEq/L (ref 96–112)
GFR calc non Af Amer: >90 mL/min (ref >90)
Glucose, Bld: 92 mg/dL (ref 70–99)
Potassium: 4 mEq/L (ref 3.5–5.1)

## 2011-01-06 ENCOUNTER — Emergency Department (HOSPITAL_COMMUNITY)
Admission: EM | Admit: 2011-01-06 | Discharge: 2011-01-06 | Disposition: A | Discharge status: Home / Self Care | Payer: Worker's Compensation | Attending: Emergency Medicine | Admitting: Emergency Medicine

## 2011-01-06 ENCOUNTER — Encounter: Payer: Self-pay | Admitting: Emergency Medicine

## 2011-01-06 DIAGNOSIS — Z9889 Other specified postprocedural states: Secondary | ICD-10-CM | POA: Insufficient documentation

## 2011-01-06 DIAGNOSIS — M79609 Pain in unspecified limb: Secondary | ICD-10-CM | POA: Insufficient documentation

## 2011-01-06 DIAGNOSIS — Z5189 Encounter for other specified aftercare: Secondary | ICD-10-CM | POA: Insufficient documentation

## 2011-01-06 DIAGNOSIS — L02419 Cutaneous abscess of limb, unspecified: Secondary | ICD-10-CM | POA: Insufficient documentation

## 2011-01-06 DIAGNOSIS — Z8673 Personal history of transient ischemic attack (TIA), and cerebral infarction without residual deficits: Secondary | ICD-10-CM | POA: Insufficient documentation

## 2011-01-06 DIAGNOSIS — Z7901 Long term (current) use of anticoagulants: Secondary | ICD-10-CM | POA: Insufficient documentation

## 2011-01-06 DIAGNOSIS — F411 Generalized anxiety disorder: Secondary | ICD-10-CM | POA: Insufficient documentation

## 2011-01-06 DIAGNOSIS — Z79899 Other long term (current) drug therapy: Secondary | ICD-10-CM | POA: Insufficient documentation

## 2011-01-06 HISTORY — DX: Cerebral infarction, unspecified: I63.9

## 2011-01-06 HISTORY — DX: Anxiety disorder, unspecified: F41.9

## 2011-01-06 MED ORDER — OXYCODONE-ACETAMINOPHEN 5-325 MG PO TABS
1.0000 | ORAL_TABLET | Freq: Once | ORAL | Status: AC
  Administered 2011-01-06: 1 via ORAL
  Filled 2011-01-06: qty 1

## 2011-01-06 NOTE — ED Provider Notes (Signed)
History    patient had a small abscess in her right lower extremity, drained 3 days ago, she is on antibiotics. She feels it is healing, well. She's had no fevers no nausea, vomiting. Patient is using meticulous wound care at home.  CSN: [id number] Arrival date & time: 01/06/2011 10:02 AM   First MD Initiated Contact with Patient 01/06/11 1106      Chief Complaint  Patient presents with  . Wound Check    (Consider location/radiation/quality/duration/timing/severity/associated sxs/prior treatment) Patient is a 55 y.o. female presenting with wound check. The history is provided by the patient.  Wound Check  She was treated in the ED 2 to 3 days ago.    Past Medical History  Diagnosis Date  . Stroke   . Anxiety     Past Surgical History  Procedure Date  . Back surgery   . Knee arthroscopy     No family history on file.  History  Substance Use Topics  . Smoking status: Not on file  . Smokeless tobacco: Not on file  . Alcohol Use: No    OB History    Grav Para Term Preterm Abortions TAB SAB Ect Mult Living                  Review of Systems  All other systems reviewed and are negative.    Allergies  Review of patient's allergies indicates no known allergies.  Home Medications   Current Outpatient Rx  Name Route Sig Dispense Refill  . ESCITALOPRAM OXALATE 20 MG PO TABS Oral Take 30 mg by mouth daily.      . OXYCODONE-ACETAMINOPHEN 5-325 MG PO TABS Oral Take 2 tablets by mouth every 6 (six) hours as needed. For pain.     . TRAZODONE HCL 50 MG PO TABS Oral Take 100 mg by mouth at bedtime. For sleep.     . WARFARIN SODIUM 6 MG PO TABS Oral Take 6-9 mg by mouth daily. Pt takes 6 mg on Tuesday, Thursday ,saturday, and sunday. Pt takes 9 mg on Monday, Wednesday and Friday.      BP 123/89  Pulse 82  Temp(Src) 97.3 F (36.3 C) (Oral)  Resp 20  SpO2 99%  Physical Exam  Constitutional: She appears well-developed and well-nourished.  HENT:  Head:  Normocephalic.  Eyes: Pupils are equal, round, and reactive to light.  Cardiovascular: Normal heart sounds.   Pulmonary/Chest: Breath sounds normal.  Abdominal: Soft.  Musculoskeletal: Normal range of motion.  Neurological: She is alert.  Skin: Skin is warm and dry.       The right lower anterior shin shows healing abscess. Minimal tenderness. No crepitus. No surrounding erythema. Skin is warm and dry. No lymphangitis.    ED Course  Procedures (including critical care time)  Labs Reviewed - No data to display No results found.   No diagnosis found.    MDM  Patient is seen and examined, initial history and physical is completed. Evaluation initiated        Gaylen Pereira A. Patrica Duel, MD 01/06/11 1116

## 2011-01-06 NOTE — ED Notes (Signed)
Pt. Stated, I fell at work and I've developed a sore on my lower rtl leg.  Here on Thursday and was given pain meds and antibiotic

## 2011-01-10 ENCOUNTER — Encounter (HOSPITAL_COMMUNITY): Payer: Self-pay | Admitting: Emergency Medicine

## 2011-01-10 ENCOUNTER — Emergency Department (HOSPITAL_COMMUNITY)
Admission: EM | Admit: 2011-01-10 | Discharge: 2011-01-10 | Disposition: A | Discharge status: Home / Self Care | Payer: Worker's Compensation | Attending: Emergency Medicine | Admitting: Emergency Medicine

## 2011-01-10 DIAGNOSIS — Z7901 Long term (current) use of anticoagulants: Secondary | ICD-10-CM | POA: Insufficient documentation

## 2011-01-10 DIAGNOSIS — L03119 Cellulitis of unspecified part of limb: Secondary | ICD-10-CM

## 2011-01-10 DIAGNOSIS — F411 Generalized anxiety disorder: Secondary | ICD-10-CM | POA: Insufficient documentation

## 2011-01-10 DIAGNOSIS — M79609 Pain in unspecified limb: Secondary | ICD-10-CM | POA: Insufficient documentation

## 2011-01-10 DIAGNOSIS — E119 Type 2 diabetes mellitus without complications: Secondary | ICD-10-CM | POA: Insufficient documentation

## 2011-01-10 DIAGNOSIS — L02419 Cutaneous abscess of limb, unspecified: Secondary | ICD-10-CM | POA: Insufficient documentation

## 2011-01-10 DIAGNOSIS — M7989 Other specified soft tissue disorders: Secondary | ICD-10-CM | POA: Insufficient documentation

## 2011-01-10 DIAGNOSIS — Z8679 Personal history of other diseases of the circulatory system: Secondary | ICD-10-CM | POA: Insufficient documentation

## 2011-01-10 MED ORDER — HYDROCODONE-ACETAMINOPHEN 5-325 MG PO TABS: 2.0000 | ORAL_TABLET | ORAL | Status: AC | PRN

## 2011-01-10 NOTE — ED Provider Notes (Signed)
Medical screening examination/treatment/procedure(s) were performed by non-physician practitioner and as supervising physician I was immediately available for consultation/collaboration.  Raeford Razor, MD 01/10/11 1736

## 2011-01-10 NOTE — ED Notes (Signed)
Fell on 12/24/10  Went tp [facility] on the 10/23  Then was seen at her dr, dx w/ cellulitis and she was admitted and here for rechecked still  Draining may need readmit

## 2011-01-10 NOTE — ED Notes (Signed)
Pt reassurred that the pa will be in to see her.Marland KitchenMarland KitchenMarland Kitchen

## 2011-01-10 NOTE — ED Notes (Signed)
Pt here for another recheck of her right lower leg wound...the wound is less than 1 cm   No visible drainage   Minimal swelling...  Pt states she finishes her atb tomorrow as well as her pain medicine   Area does not feel hot to touch...has area of induration approx 3-4 cm

## 2011-01-10 NOTE — ED Provider Notes (Signed)
History    she presents for a reevaluation of cellulitis to right lower extremity. Patient states she fell 2 weeks ago and had cellulitis from a broken skin more specifically to the right lower extremity. After 1 week pt notice increase swelling to R tibia region.  Initially there were  concerning for an abscess at this site. Patient subsequently had an I&D with moderate blood instead of pus drainage, consistent with a hematoma. Surrounding cellulitis were marked, She has been taking antibiotics and pain medication. She has noticed improvement of redness and swelling but still endorsed pain and itchiness. She denies fever, weakness, or numbness. She denies any rash.  CSN: [id number] Arrival date & time: 01/10/2011 12:19 PM   First MD Initiated Contact with Patient 01/10/11 1400      Chief Complaint  Patient presents with  . Leg Pain    (Consider location/radiation/quality/duration/timing/severity/associated sxs/prior treatment) HPI  Past Medical History  Diagnosis Date  . Stroke   . Anxiety   . Diabetes mellitus     Past Surgical History  Procedure Date  . Back surgery   . Knee arthroscopy     No family history on file.  History  Substance Use Topics  . Smoking status: Not on file  . Smokeless tobacco: Not on file  . Alcohol Use: No    OB History    Grav Para Term Preterm Abortions TAB SAB Ect Mult Living                  Review of Systems  All other systems reviewed and are negative.    Allergies  Review of patient's allergies indicates no known allergies.  Home Medications   Current Outpatient Rx  Name Route Sig Dispense Refill  . ESCITALOPRAM OXALATE 20 MG PO TABS Oral Take 30 mg by mouth daily.      . OXYCODONE-ACETAMINOPHEN 5-325 MG PO TABS Oral Take 2 tablets by mouth every 6 (six) hours as needed. For pain.     . TRAZODONE HCL 50 MG PO TABS Oral Take 100 mg by mouth at bedtime. For sleep.     . WARFARIN SODIUM 6 MG PO TABS Oral Take 6-9 mg by mouth  daily. Pt takes 6 mg on Tuesday, Thursday ,saturday, and sunday. Pt takes 9 mg on Monday, Wednesday and Friday.      BP 138/86  Pulse 69  Temp 97.5 F (36.4 C)  Resp 20  SpO2 97%  Physical Exam  Nursing note and vitals reviewed. Constitutional:       Awake, alert, nontoxic appearance  HENT:  Head: Atraumatic.  Eyes: Right eye exhibits no discharge. Left eye exhibits no discharge.  Neck: Neck supple.  Pulmonary/Chest: Effort normal. She exhibits no tenderness.  Abdominal: There is no tenderness. There is no rebound.  Musculoskeletal: She exhibits no tenderness.       R lower extremities: area of redness and tenderness to mid anterior tibia region, mild warmth.  Minimal swelling.  Sensation intact, FROM.   Neurological:       Mental status and motor strength appears baseline for patient and situation  Skin: No rash noted.  Psychiatric: She has a normal mood and affect.    ED Course  Procedures (including critical care time)  Labs Reviewed - No data to display No results found.   No diagnosis found.    MDM  Pt has improvement in her right lower extremity cellulitis.  Has one more dose of antibiotics but has ran out of  her pain medication.  She has no fever or no systemic signs of infection. She was given, I will prescribe short-term pain medication and have patient follow up with the primary care doctor. Patient agrees with plan.        Fayrene Helper, PA Resident 01/10/11 [id number]

## 2011-01-17 ENCOUNTER — Encounter: Payer: Medicare Other | Admitting: Physical Medicine & Rehabilitation

## 2011-02-28 ENCOUNTER — Encounter: Payer: Medicare Other | Attending: Physical Medicine & Rehabilitation

## 2011-02-28 ENCOUNTER — Ambulatory Visit: Payer: Medicare Other | Admitting: Physical Medicine & Rehabilitation

## 2011-02-28 DIAGNOSIS — M961 Postlaminectomy syndrome, not elsewhere classified: Secondary | ICD-10-CM

## 2011-02-28 DIAGNOSIS — M545 Low back pain, unspecified: Secondary | ICD-10-CM | POA: Insufficient documentation

## 2011-02-28 DIAGNOSIS — M546 Pain in thoracic spine: Secondary | ICD-10-CM | POA: Insufficient documentation

## 2011-02-28 DIAGNOSIS — M47817 Spondylosis without myelopathy or radiculopathy, lumbosacral region: Secondary | ICD-10-CM | POA: Insufficient documentation

## 2011-02-28 DIAGNOSIS — Z981 Arthrodesis status: Secondary | ICD-10-CM | POA: Insufficient documentation

## 2011-02-28 NOTE — Assessment & Plan Note (Signed)
REASON FOR VISIT:  Back pain, mainly low back area.  A 55 year old female who has undergone thoracolumbar fusion.  She had surgery in 2010.  She underwent a T10-11 fusion, thoracotomy route, failed that fusion, underwent extended fusion posteriorly T8-L1, once again failed, had augmentation with T9-T12 anterior fusion.  MRI showed degenerative facet arthropathy L1-S1.  No signs of spinal stenosis.  She has undergone medial branch blocks at this clinic initially T12, L1, L2, which were helpful.  She get the okay to come off her warfarin by her primary MD prior to procedure.  She had improvement in low back pain with L3-L4 medial branch blocks and L5 dorsal ramus injection on 2 occasions.  She had a 100% relief on March 29, 2010, for T12, L1, L2 100% relief for L3, 4, 5.  On June 14, 2010  and on September 27, 2010, she underwent repeat L3, 4, 5, medial branch blocks, went from a 5/10 to 2/10.  She has had no new medical problems since I last saw her in July, states that she did not follow up in August as planned because of various family issues.  She needs help with certain household duties and shopping related to her pain, she continues to work 32 hours a week. She has numbness and tingling in the right hip area wrapping around to the right anterior upper abdomen.  PHYSICAL EXAMINATION:  VITAL SIGNS:  Blood pressure is 118/66, respirations 16, pulse 91, O2 sat 96% on room air. GENERAL:  No acute distress.  Mood and affect appropriate. MUSCULOSKELETAL:  Her back has some tenderness at the lumbosacral junction.  Straight leg raising test is negative.  Lower extremity strength is normal.  She also has some tenderness right under the bra, right on the mid to lower thoracic area.  IMPRESSION: 1. Lower lumbar pain.  I think this is recurrence of her spondylosis,     has had good and prolonged relief from her medial branch blocks,     however, we will need to proceed into radiofrequency  procedure to     get longer relief that the patient desires. 2. Thoracic pain status post multiple thoracic fusion procedures.  I     asked the patient to follow up with her surgeon, Dr. Yevette Edwards on     this.  Discussed with patient, agrees with plan.     Erick Colace, M.D. Electronically Signed    AEK/MedQ D:  02/28/2011 14:16:09  T:  02/28/2011 20:40:21  Job #:  [id number]  cc:   Estill Bamberg, MD Fax: [phone]

## 2011-03-14 DIAGNOSIS — Z7901 Long term (current) use of anticoagulants: Secondary | ICD-10-CM | POA: Diagnosis not present

## 2011-03-14 DIAGNOSIS — I749 Embolism and thrombosis of unspecified artery: Secondary | ICD-10-CM | POA: Diagnosis not present

## 2011-03-16 DIAGNOSIS — M659 Synovitis and tenosynovitis, unspecified: Secondary | ICD-10-CM | POA: Diagnosis not present

## 2011-03-16 DIAGNOSIS — M25569 Pain in unspecified knee: Secondary | ICD-10-CM | POA: Diagnosis not present

## 2011-03-16 DIAGNOSIS — M25539 Pain in unspecified wrist: Secondary | ICD-10-CM | POA: Diagnosis not present

## 2011-03-27 DIAGNOSIS — I749 Embolism and thrombosis of unspecified artery: Secondary | ICD-10-CM | POA: Diagnosis not present

## 2011-03-27 DIAGNOSIS — Z7901 Long term (current) use of anticoagulants: Secondary | ICD-10-CM | POA: Diagnosis not present

## 2011-03-28 ENCOUNTER — Encounter: Payer: Medicare Other | Admitting: Physical Medicine & Rehabilitation

## 2011-03-28 ENCOUNTER — Encounter: Payer: Medicare Other | Attending: Physical Medicine & Rehabilitation

## 2011-03-28 DIAGNOSIS — Z79899 Other long term (current) drug therapy: Secondary | ICD-10-CM | POA: Diagnosis not present

## 2011-03-28 DIAGNOSIS — Z8679 Personal history of other diseases of the circulatory system: Secondary | ICD-10-CM | POA: Diagnosis not present

## 2011-03-28 DIAGNOSIS — M545 Low back pain, unspecified: Secondary | ICD-10-CM | POA: Insufficient documentation

## 2011-03-28 DIAGNOSIS — M546 Pain in thoracic spine: Secondary | ICD-10-CM | POA: Insufficient documentation

## 2011-03-28 DIAGNOSIS — R51 Headache: Secondary | ICD-10-CM | POA: Diagnosis not present

## 2011-03-28 DIAGNOSIS — Z981 Arthrodesis status: Secondary | ICD-10-CM | POA: Insufficient documentation

## 2011-03-28 DIAGNOSIS — M47817 Spondylosis without myelopathy or radiculopathy, lumbosacral region: Secondary | ICD-10-CM | POA: Insufficient documentation

## 2011-03-29 ENCOUNTER — Encounter: Payer: Medicare Other | Admitting: Physical Medicine & Rehabilitation

## 2011-03-29 DIAGNOSIS — Z981 Arthrodesis status: Secondary | ICD-10-CM | POA: Diagnosis not present

## 2011-03-29 DIAGNOSIS — M546 Pain in thoracic spine: Secondary | ICD-10-CM | POA: Diagnosis not present

## 2011-03-29 DIAGNOSIS — M47817 Spondylosis without myelopathy or radiculopathy, lumbosacral region: Secondary | ICD-10-CM

## 2011-03-29 DIAGNOSIS — M545 Low back pain, unspecified: Secondary | ICD-10-CM | POA: Diagnosis not present

## 2011-03-29 NOTE — Procedures (Signed)
Emily Peck, Emily Peck              ACCOUNT NO.:  [id number]  MEDICAL RECORD NO.:  [id number]           PATIENT TYPE:  O  LOCATION:  [facility]                          FACILITY:  [facility]  PHYSICIAN:  Erick Colace, M.D.DATE OF BIRTH:  05/01/1955  DATE OF PROCEDURE:  03/28/2011 DATE OF DISCHARGE:                              OPERATIVE REPORT  PROCEDURE:  Right L5 dorsal ramus radiofrequency neurotomy and right L3 and L4 medial branch radiofrequency neurotomy.  INDICATIONS:  Lumbar spondylosis.  She has pain that was relieved by greater than 50% with medial branch blocks at corresponding levels. Pain is only partially response to medication management, other conservative care.  Her INR was 2.9.  No Coumadin since this level was drawn yesterday.  Informed consent was obtained after describing risks and benefits of the procedure with the patient.  These include bleeding, bruising, and infection.  She elects to proceed and has given written consent.  The patient was placed prone on fluoroscopy table.  Betadine prep, sterile drape, 25-gauge inch and half needle was used to anesthetize skin and subcu tissue, 1% lidocaine x2 mL at each of 3 sites.  Then, a 20-gauge 10-cm R-F needle with 10-mm curved active tip was inserted under fluoroscopic guidance into the right S1 SAP sacral junction.  Bone contact made, confirmed with lateral imaging, sensory stim at 50 Hz followed by motor stim at 2 Hz, confirmed proper needle location followed by injection 1 mL of solution containing 1 mL of 4 mg/mL dexamethasone, 2 mL of 1% MPF lidocaine.  Then, the left L5 SAP transverse process junction targeted, bone contact made, confirmed with lateral imaging.  Sensory stim at 50 Hz followed by motor stim at 2 Hz confirmed proper needle location followed by injection 1 mL of dexamethasone lidocaine solution.  Then, the right L4 SAP transverse process junction targeted, bone contact made, confirmed with  lateral imaging, sensory stim at 50 Hz followed by motor stim at 2 Hz, confirmed proper needle location followed by injection 1 mL dexamethasone lidocaine solution.  All needles were simultaneously heated, radiofrequency 80 degrees Celsius x90 seconds.  The patient tolerated the procedure well.  Postprocedure instructions given.     Erick Colace, M.D. Electronically Signed    AEK/MEDQ  D:  03/29/2011 09:44:43  T:  03/29/2011 11:56:54  Job:  [id number]

## 2011-04-01 DIAGNOSIS — M545 Low back pain: Secondary | ICD-10-CM | POA: Diagnosis not present

## 2011-04-04 DIAGNOSIS — F3130 Bipolar disorder, current episode depressed, mild or moderate severity, unspecified: Secondary | ICD-10-CM | POA: Diagnosis not present

## 2011-04-11 DIAGNOSIS — Z7901 Long term (current) use of anticoagulants: Secondary | ICD-10-CM | POA: Diagnosis not present

## 2011-04-11 DIAGNOSIS — I749 Embolism and thrombosis of unspecified artery: Secondary | ICD-10-CM | POA: Diagnosis not present

## 2011-05-02 ENCOUNTER — Encounter: Payer: Medicare Other | Admitting: Physical Medicine & Rehabilitation

## 2011-05-02 ENCOUNTER — Ambulatory Visit: Payer: Medicare Other

## 2011-05-09 ENCOUNTER — Encounter: Payer: Medicare Other | Attending: Physical Medicine & Rehabilitation

## 2011-05-09 ENCOUNTER — Encounter: Payer: Self-pay | Admitting: Physical Medicine & Rehabilitation

## 2011-05-09 ENCOUNTER — Ambulatory Visit (HOSPITAL_BASED_OUTPATIENT_CLINIC_OR_DEPARTMENT_OTHER): Payer: Medicare Other | Admitting: Physical Medicine & Rehabilitation

## 2011-05-09 VITALS — BP 121/63 | HR 67 | Resp 16 | Ht 66.0 in | Wt 200.0 lb

## 2011-05-09 DIAGNOSIS — M545 Low back pain, unspecified: Secondary | ICD-10-CM | POA: Diagnosis not present

## 2011-05-09 DIAGNOSIS — M47817 Spondylosis without myelopathy or radiculopathy, lumbosacral region: Secondary | ICD-10-CM | POA: Insufficient documentation

## 2011-05-09 DIAGNOSIS — Z981 Arthrodesis status: Secondary | ICD-10-CM | POA: Insufficient documentation

## 2011-05-09 DIAGNOSIS — M546 Pain in thoracic spine: Secondary | ICD-10-CM | POA: Insufficient documentation

## 2011-05-09 DIAGNOSIS — Z7901 Long term (current) use of anticoagulants: Secondary | ICD-10-CM | POA: Diagnosis not present

## 2011-05-09 MED ORDER — METHOCARBAMOL 500 MG PO TABS: 500.0000 mg | ORAL_TABLET | Freq: Three times a day (TID) | ORAL | Status: DC

## 2011-05-09 MED ORDER — TRAMADOL HCL 50 MG PO TABS: 50.0000 mg | ORAL_TABLET | Freq: Three times a day (TID) | ORAL | Status: DC

## 2011-05-09 NOTE — Progress Notes (Signed)
Left L5 dorsal ramus., left L4 and left L3 medial branch radio frequency neurotomy under fluoroscopic guidance  Indication: Low back pain due to lumbar spondylosis which has been relieved on 2 occasions by greater than 50% by lumbar medial branch blocks at corresponding levels.  Informed consent was obtained after describing risks and benefits of the procedure with the patient, this includes bleeding, bruising, infection, paralysis and medication side effects. The patient wishes to proceed and has given written consent. The patient was placed in a prone position. The lumbar and sacral area was marked and prepped with Betadine. A 25-gauge 1-1/2 inch needle was inserted into the skin and subcutaneous tissue at 3 sites in one ML of 1% lidocaine was injected into each site. Then a 20-gauge 10 cm radio frequency needle with a 1 cm curved active tip was inserted targeting the left S1 SAP/sacral ala junction. Bone contact was made and confirmed with lateral imaging. Sensory stimulation at 50 Hz followed by motor stimulation at 2 Hz confirm proper needle location followed by injection of one ML of the solution containing one ML of 4 mg per mL dexamethasone and 3 mL of 1% MPF lidocaine. Then the left L5 SAP/transverse process junction was targeted. Bone contact was made and confirmed with lateral imaging. Sensory stimulation at 50 Hz followed by motor stimulation at 2 Hz confirm proper needle location followed by injection of one ML of the solution containing one ML of 4 mg per mL dexamethasone and 3 mL of 1% MPF lidocaine. Then the left L4 SAP/transverse process junction was targeted. Bone contact was made and confirmed with lateral imaging. Sensory stimulation at 50 Hz followed by motor stimulation at 2 Hz confirm proper needle location followed by injection of one ML of the solution containing one ML of 4 mg per mL dexamethasone and 3 mL of 1% MPF lidocaine. Radio frequency lesion being at 80C for 90 seconds was  performed. Needles were removed. Post procedure instructions and vital signs were performed. Patient tolerated procedure well. Followup appointment was given.      

## 2011-05-09 NOTE — Progress Notes (Signed)
  PROCEDURE RECORD [facility]   Name: Emily Peck DOB:1955/06/10 MRN: [id number]  Date:05/09/2011  Physician: Claudette Laws, MD    Nurse/CMA: Kelli Churn, RN  Allergies: No Known Allergies  Consent Signed: yes  Is patient diabetic? no    Pregnant: no LMP: No LMP recorded. Patient is postmenopausal. (age 58-55)  Anticoagulants: no Anti-inflammatory: no Antibiotics: no  Procedure: Left Lumbar RF INR 0.9 today Position: Prone Start Time: 09:54  End Time: 10:18  Fluoro Time: 39 sec  RN/CMA Noralyn Pick, CMA Shumaker RN    Time 9:18am 10:25    BP 121/63 135/67    Pulse 67 66    Respirations 16 16    O2 Sat 99 100%    S/S 6 6    Pain Level 4/10 0/10     D/C home with Shantel, patient A & O X 3, D/C instructions reviewed, and sits independently.

## 2011-05-09 NOTE — Patient Instructions (Signed)
Resume warfarin tonite

## 2011-05-23 DIAGNOSIS — Z7901 Long term (current) use of anticoagulants: Secondary | ICD-10-CM | POA: Diagnosis not present

## 2011-05-23 DIAGNOSIS — I749 Embolism and thrombosis of unspecified artery: Secondary | ICD-10-CM | POA: Diagnosis not present

## 2011-06-04 ENCOUNTER — Encounter: Payer: Self-pay | Admitting: Physical Medicine & Rehabilitation

## 2011-06-05 DIAGNOSIS — H35379 Puckering of macula, unspecified eye: Secondary | ICD-10-CM | POA: Diagnosis not present

## 2011-06-05 DIAGNOSIS — H4420 Degenerative myopia, unspecified eye: Secondary | ICD-10-CM | POA: Diagnosis not present

## 2011-06-05 DIAGNOSIS — H15849 Scleral ectasia, unspecified eye: Secondary | ICD-10-CM | POA: Diagnosis not present

## 2011-06-06 DIAGNOSIS — I8000 Phlebitis and thrombophlebitis of superficial vessels of unspecified lower extremity: Secondary | ICD-10-CM | POA: Diagnosis not present

## 2011-06-06 DIAGNOSIS — R609 Edema, unspecified: Secondary | ICD-10-CM | POA: Diagnosis not present

## 2011-06-06 DIAGNOSIS — I749 Embolism and thrombosis of unspecified artery: Secondary | ICD-10-CM | POA: Diagnosis not present

## 2011-06-06 DIAGNOSIS — Z79899 Other long term (current) drug therapy: Secondary | ICD-10-CM | POA: Diagnosis not present

## 2011-06-06 DIAGNOSIS — Z7901 Long term (current) use of anticoagulants: Secondary | ICD-10-CM | POA: Diagnosis not present

## 2011-06-13 ENCOUNTER — Telehealth: Payer: Self-pay | Admitting: Physical Medicine & Rehabilitation

## 2011-06-13 NOTE — Telephone Encounter (Signed)
Ultram refill  

## 2011-06-13 NOTE — Telephone Encounter (Signed)
Spoke with Emily Peck and told her that her rx on 05/09/11 for Ultram had 6 refills on it so pharmacy should be able to fill.

## 2011-07-04 DIAGNOSIS — Z7901 Long term (current) use of anticoagulants: Secondary | ICD-10-CM | POA: Diagnosis not present

## 2011-07-04 DIAGNOSIS — R21 Rash and other nonspecific skin eruption: Secondary | ICD-10-CM | POA: Diagnosis not present

## 2011-08-09 ENCOUNTER — Ambulatory Visit: Payer: Medicare Other | Admitting: Physical Medicine & Rehabilitation

## 2011-08-15 ENCOUNTER — Encounter: Payer: Medicare Other | Attending: Physical Medicine & Rehabilitation

## 2011-08-15 ENCOUNTER — Ambulatory Visit: Payer: Medicare Other | Admitting: Physical Medicine & Rehabilitation

## 2011-08-15 DIAGNOSIS — Z981 Arthrodesis status: Secondary | ICD-10-CM | POA: Insufficient documentation

## 2011-08-15 DIAGNOSIS — M545 Low back pain, unspecified: Secondary | ICD-10-CM | POA: Insufficient documentation

## 2011-08-15 DIAGNOSIS — M47817 Spondylosis without myelopathy or radiculopathy, lumbosacral region: Secondary | ICD-10-CM | POA: Insufficient documentation

## 2011-08-15 DIAGNOSIS — M546 Pain in thoracic spine: Secondary | ICD-10-CM | POA: Insufficient documentation

## 2011-08-23 DIAGNOSIS — Z79899 Other long term (current) drug therapy: Secondary | ICD-10-CM | POA: Diagnosis not present

## 2011-08-23 DIAGNOSIS — L02419 Cutaneous abscess of limb, unspecified: Secondary | ICD-10-CM | POA: Diagnosis not present

## 2011-08-23 DIAGNOSIS — L03119 Cellulitis of unspecified part of limb: Secondary | ICD-10-CM | POA: Diagnosis not present

## 2011-08-23 DIAGNOSIS — R609 Edema, unspecified: Secondary | ICD-10-CM | POA: Diagnosis not present

## 2011-08-23 DIAGNOSIS — Z7901 Long term (current) use of anticoagulants: Secondary | ICD-10-CM | POA: Diagnosis not present

## 2011-08-29 ENCOUNTER — Ambulatory Visit: Payer: Medicare Other | Admitting: Physical Medicine & Rehabilitation

## 2011-09-03 ENCOUNTER — Encounter: Payer: Self-pay | Admitting: Physical Medicine & Rehabilitation

## 2011-09-03 ENCOUNTER — Encounter: Payer: Medicare Other | Attending: Physical Medicine & Rehabilitation

## 2011-09-03 ENCOUNTER — Ambulatory Visit (HOSPITAL_BASED_OUTPATIENT_CLINIC_OR_DEPARTMENT_OTHER): Payer: Medicare Other | Admitting: Physical Medicine & Rehabilitation

## 2011-09-03 VITALS — BP 137/66 | HR 58 | Resp 14 | Ht 66.0 in | Wt 197.0 lb

## 2011-09-03 DIAGNOSIS — M545 Low back pain, unspecified: Secondary | ICD-10-CM | POA: Diagnosis not present

## 2011-09-03 DIAGNOSIS — M7061 Trochanteric bursitis, right hip: Secondary | ICD-10-CM

## 2011-09-03 DIAGNOSIS — Z981 Arthrodesis status: Secondary | ICD-10-CM | POA: Diagnosis not present

## 2011-09-03 DIAGNOSIS — M47817 Spondylosis without myelopathy or radiculopathy, lumbosacral region: Secondary | ICD-10-CM

## 2011-09-03 DIAGNOSIS — M546 Pain in thoracic spine: Secondary | ICD-10-CM | POA: Insufficient documentation

## 2011-09-03 DIAGNOSIS — M76899 Other specified enthesopathies of unspecified lower limb, excluding foot: Secondary | ICD-10-CM | POA: Diagnosis not present

## 2011-09-03 DIAGNOSIS — Z1231 Encounter for screening mammogram for malignant neoplasm of breast: Secondary | ICD-10-CM | POA: Diagnosis not present

## 2011-09-03 NOTE — Patient Instructions (Signed)
I will see you in one month and possibly inject her left hip at that time.

## 2011-09-03 NOTE — Progress Notes (Signed)
Subjective:    Patient ID: Emily Peck, female    DOB: [birth year], 56 y.o.   MRN: [id number]  HPI Doing relatively well with back pain. Main complaint is) open left hip pain as well as buttocks pain. No recent trauma. She is 4 months since a left radiofrequency neurotomy in the lumbar area and 6 months since the right radiofrequency neurotomy in the lumbar area Pain Inventory Average Pain 5 Pain Right Now 4 My pain is sharp, stabbing and aching  In the last 24 hours, has pain interfered with the following? General activity 5 Relation with others 5 Enjoyment of life 5 What TIME of day is your pain at its worst? day and evening Sleep (in general) Fair  Pain is worse with: walking and standing Pain improves with: rest, heat/ice, medication, TENS and injections Relief from Meds: 5  Mobility use a cane ability to climb steps?  no do you drive?  no  Function disabled: date disabled 2008  Neuro/Psych numbness tingling trouble walking spasms  Prior Studies Any changes since last visit?  no  Physicians involved in your care Any changes since last visit?  no   Family History  Problem Relation Age of Onset  . Cancer Mother   . Diabetes Mother    History   Social History  . Marital Status: Single    Spouse Name: N/A    Number of Children: N/A  . Years of Education: N/A   Social History Main Topics  . Smoking status: Never Smoker   . Smokeless tobacco: None  . Alcohol Use: No  . Drug Use: No  . Sexually Active: None   Other Topics Concern  . None   Social History Narrative  . None   Past Surgical History  Procedure Date  . Back surgery   . Knee arthroscopy    Past Medical History  Diagnosis Date  . Stroke   . Anxiety   . Diabetes mellitus   . Postlaminectomy syndrome, thoracic region   . Postlaminectomy syndrome, lumbar region   . Lumbosacral spondylosis without myelopathy    BP 137/66  Pulse 58  Resp 14  Ht 5\' 6"  (1.676 m)  Wt 197 lb  (89.359 kg)  BMI 31.80 kg/m2  SpO2 100%     Review of Systems  Constitutional: Positive for unexpected weight change.  Musculoskeletal: Positive for back pain.  Neurological: Positive for numbness.  All other systems reviewed and are negative.       Objective:   Physical Exam  Constitutional: She is oriented to person, place, and time. She appears well-developed.       obese  Musculoskeletal:       Right hip: She exhibits tenderness.       Left hip: She exhibits tenderness.       Lumbar back: She exhibits decreased range of motion, tenderness and pain.       Pain with extension and greater than with flexion in the lumbar spine area Pain to palpation in the right greater trochanter area more so than on the left side.  Neurological: She is alert and oriented to person, place, and time.  Psychiatric: She has a normal mood and affect.          Assessment & Plan:  1. Lumbar spondylosis still responding to radiofrequency neurotomy.  2. Trochanteric bursitis right greater than left side. Will inject today 3. Bilateral buttocks pain may have sacroiliac disorder we'll see how she responds to the trochanteric bursa injections  Right trochanteric bursa injection Indication right trochanteric bursitis Pain is only partially response to medication management or other conservative care interferes with activities such as sleeping Informed consent was obtained at scarring risks and benefits of the procedure with the patient these include bleeding bruising and infection she elects to proceed and has given written consent Patient placed in the left lateral decubitus position area marked and prepped with Betadine entered with a 25-gauge 3 inch spinal needle inserted to bone contact followed by injection 1 cc of 40 November cc Depo-Medrol and 4 cc 1% lidocaine. Patient tolerated procedure well. Post procedure instructions given indication  I will see her back in one month

## 2011-09-12 DIAGNOSIS — I8000 Phlebitis and thrombophlebitis of superficial vessels of unspecified lower extremity: Secondary | ICD-10-CM | POA: Diagnosis not present

## 2011-09-12 DIAGNOSIS — Z7901 Long term (current) use of anticoagulants: Secondary | ICD-10-CM | POA: Diagnosis not present

## 2011-09-12 DIAGNOSIS — G909 Disorder of the autonomic nervous system, unspecified: Secondary | ICD-10-CM | POA: Diagnosis not present

## 2011-09-18 DIAGNOSIS — G609 Hereditary and idiopathic neuropathy, unspecified: Secondary | ICD-10-CM | POA: Diagnosis not present

## 2011-10-03 DIAGNOSIS — F3130 Bipolar disorder, current episode depressed, mild or moderate severity, unspecified: Secondary | ICD-10-CM | POA: Diagnosis not present

## 2011-10-04 ENCOUNTER — Ambulatory Visit: Payer: Medicare Other | Admitting: Physical Medicine & Rehabilitation

## 2011-10-15 ENCOUNTER — Ambulatory Visit: Payer: Medicare Other | Admitting: Physical Medicine & Rehabilitation

## 2011-10-17 ENCOUNTER — Ambulatory Visit: Payer: Medicare Other | Admitting: Physical Medicine & Rehabilitation

## 2011-10-25 DIAGNOSIS — Z7901 Long term (current) use of anticoagulants: Secondary | ICD-10-CM | POA: Diagnosis not present

## 2011-10-25 DIAGNOSIS — R609 Edema, unspecified: Secondary | ICD-10-CM | POA: Diagnosis not present

## 2011-10-25 DIAGNOSIS — M545 Low back pain: Secondary | ICD-10-CM | POA: Diagnosis not present

## 2011-10-29 ENCOUNTER — Emergency Department (HOSPITAL_COMMUNITY)
Admission: EM | Admit: 2011-10-29 | Discharge: 2011-10-30 | Disposition: A | Discharge status: Home / Self Care | Payer: Medicare Other | Attending: Emergency Medicine | Admitting: Emergency Medicine

## 2011-10-29 ENCOUNTER — Encounter (HOSPITAL_COMMUNITY): Payer: Self-pay | Admitting: Emergency Medicine

## 2011-10-29 DIAGNOSIS — Z8673 Personal history of transient ischemic attack (TIA), and cerebral infarction without residual deficits: Secondary | ICD-10-CM | POA: Insufficient documentation

## 2011-10-29 DIAGNOSIS — F411 Generalized anxiety disorder: Secondary | ICD-10-CM | POA: Insufficient documentation

## 2011-10-29 DIAGNOSIS — I10 Essential (primary) hypertension: Secondary | ICD-10-CM | POA: Insufficient documentation

## 2011-10-29 DIAGNOSIS — Z7901 Long term (current) use of anticoagulants: Secondary | ICD-10-CM | POA: Insufficient documentation

## 2011-10-29 DIAGNOSIS — R51 Headache: Secondary | ICD-10-CM | POA: Diagnosis not present

## 2011-10-29 MED ORDER — SODIUM CHLORIDE 0.9 % IV BOLUS (SEPSIS)
1000.0000 mL | Freq: Once | INTRAVENOUS | Status: AC
  Administered 2011-10-29: 1000 mL via INTRAVENOUS

## 2011-10-29 MED ORDER — METOCLOPRAMIDE HCL 5 MG/ML IJ SOLN
10.0000 mg | Freq: Once | INTRAMUSCULAR | Status: AC
  Administered 2011-10-29: 10 mg via INTRAVENOUS
  Filled 2011-10-29: qty 2

## 2011-10-29 MED ORDER — MORPHINE SULFATE 4 MG/ML IJ SOLN
4.0000 mg | Freq: Once | INTRAMUSCULAR | Status: AC
  Administered 2011-10-29: 4 mg via INTRAVENOUS
  Filled 2011-10-29: qty 1

## 2011-10-29 MED ORDER — KETOROLAC TROMETHAMINE 30 MG/ML IJ SOLN
30.0000 mg | Freq: Once | INTRAMUSCULAR | Status: AC
  Administered 2011-10-29: 30 mg via INTRAVENOUS
  Filled 2011-10-29: qty 1

## 2011-10-29 NOTE — ED Notes (Signed)
Pt states her sxs started last night and have progressively gotten worse today  Pt states she had her blood pressure this am at work and it was 145/99 then later it was 130/86  Pt went to the dr office and it was 138/85 and then again at the drug store it was elevated again at 154/103 and 154/107  Pt states she has had a headache and she states on the left side of her head and face it feels like someone lightly scratching her   No acute distress noted in triage at this time

## 2011-10-29 NOTE — ED Notes (Signed)
Pt sts that this headache has been hurting since yesterday last night and now it is progressing into more pain.

## 2011-10-30 MED ORDER — HYDROCODONE-ACETAMINOPHEN 5-325 MG PO TABS: 1.0000 | ORAL_TABLET | ORAL | Status: AC | PRN

## 2011-10-30 NOTE — ED Provider Notes (Signed)
History     CSN: [id number]  Arrival date & time 10/29/11  1857   First MD Initiated Contact with Patient 10/29/11 2304      Chief Complaint  Patient presents with  . Headache  . Hypertension    (Consider location/radiation/quality/duration/timing/severity/associated sxs/prior treatment) The history is provided by the patient.   the patient reports she's had worsening headache over the past 24-48 hours.  She reports her headache is located in the left side of her head and is worse with light and noises.  She's history migraine headaches and reports a long time since she had a migraine headache.  She went to the drugstore and checked her blood pressure were was elevated to 154/103.  She was concerned about her blood pressure and thus presented emergency department for evaluation.  She denies weakness of her upper lower extremities.  She's had no change in her vision.  She denies chest pain shortness breath.  She's had no abdominal pain.  She denies nausea and vomiting.  She is on Coumadin but has had no recent falls.  She's had no difficulty ambulating.  Past Medical History  Diagnosis Date  . Stroke   . Anxiety   . Postlaminectomy syndrome, thoracic region   . Postlaminectomy syndrome, lumbar region   . Lumbosacral spondylosis without myelopathy     Past Surgical History  Procedure Date  . Back surgery   . Knee arthroscopy     Family History  Problem Relation Age of Onset  . Cancer Mother   . Diabetes Mother     History  Substance Use Topics  . Smoking status: Never Smoker   . Smokeless tobacco: Not on file  . Alcohol Use: No    OB History    Grav Para Term Preterm Abortions TAB SAB Ect Mult Living                  Review of Systems  All other systems reviewed and are negative.    Allergies  Review of patient's allergies indicates no known allergies.  Home Medications   Current Outpatient Rx  Name Route Sig Dispense Refill  . CYCLOBENZAPRINE HCL 5 MG  PO TABS Oral Take 5 mg by mouth 3 (three) times daily as needed. For muscle pain.    Marland Kitchen ESCITALOPRAM OXALATE 20 MG PO TABS Oral Take 20 mg by mouth daily.     . TRAZODONE HCL 50 MG PO TABS Oral Take 100 mg by mouth at bedtime. For sleep.    . WARFARIN SODIUM 6 MG PO TABS Oral Take 6-9 mg by mouth daily. Pt takes 6 mg on Tuesday, Thursday ,saturday, and sunday. Pt takes 9 mg on Monday, Wednesday and Friday.    Marland Kitchen HYDROCODONE-ACETAMINOPHEN 5-325 MG PO TABS Oral Take 1 tablet by mouth every 4 (four) hours as needed for pain. 15 tablet 0  . TRAMADOL HCL 50 MG PO TABS Oral Take 1 tablet (50 mg total) by mouth 3 (three) times daily. 90 tablet 6    BP 150/98  Pulse 74  Temp 97.8 F (36.6 C) (Oral)  Resp 16  Ht 5\' 6"  (1.676 m)  Wt 197 lb (89.359 kg)  BMI 31.80 kg/m2  SpO2 98%  Physical Exam  Nursing note and vitals reviewed. Constitutional: She is oriented to person, place, and time. She appears well-developed and well-nourished. No distress.  HENT:  Head: Normocephalic and atraumatic.  Eyes: EOM are normal. Pupils are equal, round, and reactive to light.  Neck: Normal  range of motion.  Cardiovascular: Normal rate, regular rhythm and normal heart sounds.   Pulmonary/Chest: Effort normal and breath sounds normal.  Abdominal: Soft. She exhibits no distension. There is no tenderness.  Musculoskeletal: Normal range of motion.  Neurological: She is alert and oriented to person, place, and time.       5/5 strength in major muscle groups of  bilateral upper and lower extremities. Speech normal. No facial asymetry.   Skin: Skin is warm and dry.  Psychiatric: She has a normal mood and affect. Judgment normal.    ED Course  Procedures (including critical care time)  Labs Reviewed - No data to display No results found.   1. Headache       MDM  The patient's headache is improving after treatment with migraine cocktail.  I suspect this is migraine.  Her blood pressures been in a nonemergent  level in the emergency department.  Close PCP followup.  Home with a short course pain medicine.  Normal neurologic exam.  No indication for imaging at this time.  No recent falls to suggest bleed.        Lyanne Co, MD 10/30/11 [phone]

## 2011-11-22 DIAGNOSIS — Z7901 Long term (current) use of anticoagulants: Secondary | ICD-10-CM | POA: Diagnosis not present

## 2011-11-22 DIAGNOSIS — R51 Headache: Secondary | ICD-10-CM | POA: Diagnosis not present

## 2011-11-22 DIAGNOSIS — F331 Major depressive disorder, recurrent, moderate: Secondary | ICD-10-CM | POA: Diagnosis not present

## 2011-11-22 DIAGNOSIS — I749 Embolism and thrombosis of unspecified artery: Secondary | ICD-10-CM | POA: Diagnosis not present

## 2011-12-02 ENCOUNTER — Ambulatory Visit: Payer: Medicare Other | Admitting: Physical Medicine & Rehabilitation

## 2011-12-16 ENCOUNTER — Encounter: Payer: Medicare Other | Attending: Physical Medicine & Rehabilitation

## 2011-12-16 ENCOUNTER — Ambulatory Visit (HOSPITAL_BASED_OUTPATIENT_CLINIC_OR_DEPARTMENT_OTHER): Payer: Medicare Other | Admitting: Physical Medicine & Rehabilitation

## 2011-12-16 ENCOUNTER — Encounter: Payer: Self-pay | Admitting: Physical Medicine & Rehabilitation

## 2011-12-16 VITALS — BP 111/68 | HR 54 | Resp 16 | Ht 66.0 in | Wt 192.0 lb

## 2011-12-16 DIAGNOSIS — M47817 Spondylosis without myelopathy or radiculopathy, lumbosacral region: Secondary | ICD-10-CM | POA: Insufficient documentation

## 2011-12-16 DIAGNOSIS — Z981 Arthrodesis status: Secondary | ICD-10-CM | POA: Insufficient documentation

## 2011-12-16 DIAGNOSIS — M545 Low back pain, unspecified: Secondary | ICD-10-CM | POA: Insufficient documentation

## 2011-12-16 DIAGNOSIS — M7060 Trochanteric bursitis, unspecified hip: Secondary | ICD-10-CM

## 2011-12-16 DIAGNOSIS — M76899 Other specified enthesopathies of unspecified lower limb, excluding foot: Secondary | ICD-10-CM

## 2011-12-16 DIAGNOSIS — M546 Pain in thoracic spine: Secondary | ICD-10-CM | POA: Insufficient documentation

## 2011-12-16 MED ORDER — DIAZEPAM 10 MG PO TABS: 10.0000 mg | ORAL_TABLET | Freq: Once | ORAL | Status: DC

## 2011-12-16 NOTE — Progress Notes (Signed)
Subjective:    Patient ID: Emily Peck, female    DOB: Jun 24, 1955, 56 y.o.   MRN: [id number]  HPI L RF 6-7 mo post Right RF 9-10 months post. Increasing pain on the right side Left hip pain responded well to trochanteric bursa injection performed 09/03/2011 Right hip is hurting Pain Inventory Average Pain 3 Pain Right Now 4 My pain is sharp and stabbing  In the last 24 hours, has pain interfered with the following? General activity 5 Relation with others 5 Enjoyment of life 5 What TIME of day is your pain at its worst? evening Sleep (in general) Fair  Pain is worse with: walking, sitting, standing and some activites Pain improves with: rest, heat/ice, medication and TENS Relief from Meds: 5  Mobility use a cane  Function disabled: date disabled   Neuro/Psych spasms  Prior Studies Any changes since last visit?  no  Physicians involved in your care Any changes since last visit?  no   Family History  Problem Relation Age of Onset  . Cancer Mother   . Diabetes Mother    History   Social History  . Marital Status: Single    Spouse Name: N/A    Number of Children: N/A  . Years of Education: N/A   Social History Main Topics  . Smoking status: Never Smoker   . Smokeless tobacco: None  . Alcohol Use: No  . Drug Use: No  . Sexually Active: None   Other Topics Concern  . None   Social History Narrative  . None   Past Surgical History  Procedure Date  . Back surgery   . Knee arthroscopy    Past Medical History  Diagnosis Date  . Stroke   . Anxiety   . Postlaminectomy syndrome, thoracic region   . Postlaminectomy syndrome, lumbar region   . Lumbosacral spondylosis without myelopathy    BP 111/68  Pulse 54  Resp 16  Ht 5\' 6"  (1.676 m)  Wt 192 lb (87.091 kg)  BMI 30.99 kg/m2  SpO2 100%      Review of Systems  Constitutional: Negative.   HENT: Negative.   Eyes: Negative.   Respiratory: Negative.   Cardiovascular: Negative.     Gastrointestinal: Negative.   Genitourinary: Negative.   Musculoskeletal: Positive for myalgias, back pain, arthralgias and gait problem.  Skin: Negative.   Neurological:       Spasms  Hematological: Negative.   Psychiatric/Behavioral: Negative.        Objective:   Physical Exam  Constitutional: She is oriented to person, place, and time. She appears well-developed.  obese  Musculoskeletal:  Right hip: She exhibits tenderness.  Left hip: She exhibits no tenderness.  Lumbar back: She exhibits decreased range of motion, tenderness and pain.  Pain with extension and greater than with flexion in the lumbar spine area. There is pain on the right side at the L5-S1 junction which worsens with extension Pain to palpation in the left greater trochanter area more so than on the right side.  Neurological: She is alert and oriented to person, place, and time.  Psychiatric: She has a normal mood and affect.        Assessment & Plan:  1. Lumbar spondylosis still responding to radiofrequency neurotomy on the left however right side is wearing off..  2. Trochanteric bursitis left greater than right side. Will inject today  3. Bilateral buttocks pain multifactorial from #1 and #2  Right trochanteric bursa injection  Indication right trochanteric bursitis  Pain is only partially response to medication management or other conservative care interferes with activities such as sleeping  Informed consent was obtained at scarring risks and benefits of the procedure with the patient these include bleeding bruising and infection she elects to proceed and has given written consent  Patient placed in the left lateral decubitus position area marked and prepped with Betadine entered with a 25-gauge 3 inch spinal needle inserted to bone contact followed by injection 1 cc of 40 November cc Depo-Medrol and 4 cc 1% lidocaine. Patient tolerated procedure well. Post procedure instructions given indication

## 2011-12-16 NOTE — Patient Instructions (Signed)
Next month will be radiofrequency procedure on the right side, you will need a driver

## 2011-12-17 ENCOUNTER — Ambulatory Visit: Payer: Medicare Other | Admitting: Physical Medicine & Rehabilitation

## 2011-12-17 DIAGNOSIS — M47817 Spondylosis without myelopathy or radiculopathy, lumbosacral region: Secondary | ICD-10-CM | POA: Diagnosis not present

## 2011-12-17 DIAGNOSIS — M546 Pain in thoracic spine: Secondary | ICD-10-CM | POA: Diagnosis not present

## 2011-12-17 DIAGNOSIS — M545 Low back pain, unspecified: Secondary | ICD-10-CM | POA: Diagnosis not present

## 2011-12-17 DIAGNOSIS — Z981 Arthrodesis status: Secondary | ICD-10-CM | POA: Diagnosis not present

## 2011-12-27 DIAGNOSIS — F3130 Bipolar disorder, current episode depressed, mild or moderate severity, unspecified: Secondary | ICD-10-CM | POA: Diagnosis not present

## 2011-12-27 DIAGNOSIS — Z7901 Long term (current) use of anticoagulants: Secondary | ICD-10-CM | POA: Diagnosis not present

## 2011-12-27 DIAGNOSIS — I749 Embolism and thrombosis of unspecified artery: Secondary | ICD-10-CM | POA: Diagnosis not present

## 2012-01-15 ENCOUNTER — Other Ambulatory Visit: Payer: Self-pay | Admitting: Physical Medicine & Rehabilitation

## 2012-02-07 DIAGNOSIS — Z79899 Other long term (current) drug therapy: Secondary | ICD-10-CM | POA: Diagnosis not present

## 2012-02-07 DIAGNOSIS — Z7901 Long term (current) use of anticoagulants: Secondary | ICD-10-CM | POA: Diagnosis not present

## 2012-02-20 DIAGNOSIS — Z7901 Long term (current) use of anticoagulants: Secondary | ICD-10-CM | POA: Diagnosis not present

## 2012-03-05 DIAGNOSIS — Z7901 Long term (current) use of anticoagulants: Secondary | ICD-10-CM | POA: Diagnosis not present

## 2012-03-17 ENCOUNTER — Encounter: Payer: Medicare Other | Attending: Physical Medicine & Rehabilitation

## 2012-03-17 ENCOUNTER — Ambulatory Visit: Payer: Medicare Other | Admitting: Physical Medicine & Rehabilitation

## 2012-03-17 DIAGNOSIS — M47817 Spondylosis without myelopathy or radiculopathy, lumbosacral region: Secondary | ICD-10-CM

## 2012-03-17 DIAGNOSIS — Z981 Arthrodesis status: Secondary | ICD-10-CM | POA: Insufficient documentation

## 2012-03-17 DIAGNOSIS — M545 Low back pain, unspecified: Secondary | ICD-10-CM | POA: Insufficient documentation

## 2012-03-17 DIAGNOSIS — M546 Pain in thoracic spine: Secondary | ICD-10-CM | POA: Insufficient documentation

## 2012-03-17 NOTE — Progress Notes (Signed)
Procedure not done today.  Patient has taken coumadin.  Advised to discontinue 5 days before next procedure.  Patient understands.  Pre procedure information given to patient.

## 2012-03-18 DIAGNOSIS — F3130 Bipolar disorder, current episode depressed, mild or moderate severity, unspecified: Secondary | ICD-10-CM | POA: Diagnosis not present

## 2012-03-25 ENCOUNTER — Telehealth: Payer: Self-pay | Admitting: *Deleted

## 2012-03-25 NOTE — Telephone Encounter (Signed)
Left message for Emily Peck to [facility] to inquire if she has requested a back and knee braces from [employer].

## 2012-03-26 NOTE — Telephone Encounter (Signed)
Patient returned call about back brace form. She is requesting this to be filled out by Dr. Wynn Banker for back brace only.

## 2012-04-01 DIAGNOSIS — N950 Postmenopausal bleeding: Secondary | ICD-10-CM | POA: Diagnosis not present

## 2012-04-01 DIAGNOSIS — N949 Unspecified condition associated with female genital organs and menstrual cycle: Secondary | ICD-10-CM | POA: Diagnosis not present

## 2012-04-01 DIAGNOSIS — N952 Postmenopausal atrophic vaginitis: Secondary | ICD-10-CM | POA: Diagnosis not present

## 2012-04-06 DIAGNOSIS — I2510 Atherosclerotic heart disease of native coronary artery without angina pectoris: Secondary | ICD-10-CM | POA: Diagnosis not present

## 2012-04-06 DIAGNOSIS — Z7901 Long term (current) use of anticoagulants: Secondary | ICD-10-CM | POA: Diagnosis not present

## 2012-04-07 ENCOUNTER — Encounter: Payer: Self-pay | Admitting: Physical Medicine & Rehabilitation

## 2012-04-07 ENCOUNTER — Encounter: Payer: Medicare Other | Attending: Physical Medicine & Rehabilitation

## 2012-04-07 ENCOUNTER — Ambulatory Visit (HOSPITAL_BASED_OUTPATIENT_CLINIC_OR_DEPARTMENT_OTHER): Payer: Medicare Other | Admitting: Physical Medicine & Rehabilitation

## 2012-04-07 VITALS — BP 129/70 | HR 75 | Resp 14 | Ht 66.0 in | Wt 196.3 lb

## 2012-04-07 DIAGNOSIS — M545 Low back pain, unspecified: Secondary | ICD-10-CM | POA: Insufficient documentation

## 2012-04-07 DIAGNOSIS — M546 Pain in thoracic spine: Secondary | ICD-10-CM | POA: Insufficient documentation

## 2012-04-07 DIAGNOSIS — M47817 Spondylosis without myelopathy or radiculopathy, lumbosacral region: Secondary | ICD-10-CM

## 2012-04-07 DIAGNOSIS — Z981 Arthrodesis status: Secondary | ICD-10-CM | POA: Diagnosis not present

## 2012-04-07 NOTE — Progress Notes (Signed)
  PROCEDURE RECORD [facility]   Name: Emily Peck DOB:Aug 31, 1955 MRN: [id number]  Date:04/07/2012  Physician: Claudette Laws, MD    Nurse/CMA: Emri Sample RN  Allergies: No Known Allergies  Consent Signed: yes  Is patient diabetic? no  CBG today?   Pregnant: no LMP: No LMP recorded. Patient has had a hysterectomy. (age 57-55)  Anticoagulants: off coumadin for 5 days.  INR 1.1 today Anti-inflammatory: no Antibiotics: no  Procedure: left lumbar 3-4-5 radiofrequency neurotomy Position: Prone Start Time: 11:08 End Time: 11:44 Fluoro Time: 1:20 seconds  RN/CMA Haematologist RN    Time 1043 11:48    BP 129/70 129/68    Pulse 75 58    Respirations 14 14    O2 Sat 99 97    S/S 6 6    Pain Level 5/10 0/10     D/C home with SCAT, patient A & O X 3, D/C instructions reviewed, and sits independently.

## 2012-04-07 NOTE — Progress Notes (Signed)
Left L5 dorsal ramus., left L4 and left L3 medial branch radio frequency neurotomy under fluoroscopic guidance  Indication: Low back pain due to lumbar spondylosis which has been relieved on 2 occasions by greater than 50% by lumbar medial branch blocks at corresponding levels.  Informed consent was obtained after describing risks and benefits of the procedure with the patient, this includes bleeding, bruising, infection, paralysis and medication side effects. The patient wishes to proceed and has given written consent. The patient was placed in a prone position. The lumbar and sacral area was marked and prepped with Betadine. A 25-gauge 1-1/2 inch needle was inserted into the skin and subcutaneous tissue at 3 sites in one ML of 1% lidocaine was injected into each site. Then a 20-gauge 10 cm radio frequency needle with a 1 cm curved active tip was inserted targeting the left S1 SAP/sacral ala junction. Bone contact was made and confirmed with lateral imaging. Sensory stimulation at 50 Hz followed by motor stimulation at 2 Hz confirm proper needle location followed by injection of one ML of the solution containing one ML of 4 mg per mL dexamethasone and 3 mL of 1% MPF lidocaine. Then the left L5 SAP/transverse process junction was targeted. Bone contact was made and confirmed with lateral imaging. Sensory stimulation at 50 Hz followed by motor stimulation at 2 Hz confirm proper needle location followed by injection of one ML of the solution containing one ML of 4 mg per mL dexamethasone and 3 mL of 1% MPF lidocaine. Then the left L4 SAP/transverse process junction was targeted. Bone contact was made and confirmed with lateral imaging. Sensory stimulation at 50 Hz followed by motor stimulation at 2 Hz confirm proper needle location followed by injection of one ML of the solution containing one ML of 4 mg per mL dexamethasone and 3 mL of 1% MPF lidocaine. Radio frequency lesion being at 80C for 90 seconds was  performed. Needles were removed. Post procedure instructions and vital signs were performed. Patient tolerated procedure well. Followup appointment was given.      

## 2012-04-07 NOTE — Patient Instructions (Addendum)
Radiofrequency Lesioning Care After Refer to this sheet in the next few weeks. These instructions provide you with information on caring for yourself after your procedure. Your caregiver may also give you more specific instructions. Your treatment has been planned according to current medical practices, but problems sometimes occur. Call your caregiver if you have any problems or questions after your procedure. HOME CARE INSTRUCTIONS  Take pain medicine as directed by your caregiver.  You may have pain from the burned nerve for a while after the procedure. This takes time to heal. Ask your caregiver how long you should expect pain.  You should be able to return to your normal activities a couple of days after the procedure. When you can go back to work will depend on the type of work you do. Discuss this with your caregiver.  Pay close attention to how you feel after the procedure. If you start having pain, write down when it hurts and how it feels. This will help you and your caregiver know if you need another treatment. SEEK MEDICAL CARE IF:  The site where the needle was inserted for the procedure becomes red, swollen, or tender to touch.  Your pain does not get better. SEEK IMMEDIATE MEDICAL CARE IF:  You develop sudden, severe pain.  You develop numbness or tingling near the procedure site.  You have a fever. MAKE SURE YOU:  Understand these instructions.  Will watch your condition.  Will get help right away if you are not doing well or get worse. Document Released: 10/17/2010 Document Revised: 05/13/2011 Document Reviewed: 10/17/2010 [employer] Patient Information 2013 [employer].  

## 2012-04-15 DIAGNOSIS — I6789 Other cerebrovascular disease: Secondary | ICD-10-CM | POA: Diagnosis not present

## 2012-04-15 DIAGNOSIS — Z7901 Long term (current) use of anticoagulants: Secondary | ICD-10-CM | POA: Diagnosis not present

## 2012-04-27 DIAGNOSIS — M25569 Pain in unspecified knee: Secondary | ICD-10-CM | POA: Diagnosis not present

## 2012-04-29 DIAGNOSIS — R030 Elevated blood-pressure reading, without diagnosis of hypertension: Secondary | ICD-10-CM | POA: Diagnosis not present

## 2012-04-29 DIAGNOSIS — Z7901 Long term (current) use of anticoagulants: Secondary | ICD-10-CM | POA: Diagnosis not present

## 2012-05-05 ENCOUNTER — Ambulatory Visit: Payer: Medicare Other | Admitting: Physical Medicine & Rehabilitation

## 2012-05-12 DIAGNOSIS — F3130 Bipolar disorder, current episode depressed, mild or moderate severity, unspecified: Secondary | ICD-10-CM | POA: Diagnosis not present

## 2012-05-14 DIAGNOSIS — Z7901 Long term (current) use of anticoagulants: Secondary | ICD-10-CM | POA: Diagnosis not present

## 2012-05-20 DIAGNOSIS — H4420 Degenerative myopia, unspecified eye: Secondary | ICD-10-CM | POA: Diagnosis not present

## 2012-05-29 ENCOUNTER — Ambulatory Visit: Payer: Medicare Other | Admitting: Physical Medicine & Rehabilitation

## 2012-06-03 DIAGNOSIS — M654 Radial styloid tenosynovitis [de Quervain]: Secondary | ICD-10-CM | POA: Diagnosis not present

## 2012-06-04 ENCOUNTER — Other Ambulatory Visit: Payer: Self-pay | Admitting: Physical Medicine & Rehabilitation

## 2012-06-04 ENCOUNTER — Ambulatory Visit (HOSPITAL_BASED_OUTPATIENT_CLINIC_OR_DEPARTMENT_OTHER): Payer: Medicare Other | Admitting: Physical Medicine & Rehabilitation

## 2012-06-04 ENCOUNTER — Encounter: Payer: Medicare Other | Attending: Physical Medicine & Rehabilitation

## 2012-06-04 ENCOUNTER — Encounter: Payer: Self-pay | Admitting: Physical Medicine & Rehabilitation

## 2012-06-04 VITALS — BP 108/72 | HR 60 | Resp 14 | Ht 66.0 in | Wt 196.0 lb

## 2012-06-04 DIAGNOSIS — IMO0001 Reserved for inherently not codable concepts without codable children: Secondary | ICD-10-CM | POA: Insufficient documentation

## 2012-06-04 DIAGNOSIS — Z79899 Other long term (current) drug therapy: Secondary | ICD-10-CM | POA: Insufficient documentation

## 2012-06-04 DIAGNOSIS — M47817 Spondylosis without myelopathy or radiculopathy, lumbosacral region: Secondary | ICD-10-CM | POA: Insufficient documentation

## 2012-06-04 DIAGNOSIS — M545 Low back pain, unspecified: Secondary | ICD-10-CM | POA: Diagnosis not present

## 2012-06-04 MED ORDER — TRAMADOL HCL 50 MG PO TABS: 50.0000 mg | ORAL_TABLET | Freq: Three times a day (TID) | ORAL | Status: DC | PRN

## 2012-06-04 MED ORDER — TIZANIDINE HCL 4 MG PO TABS: 2.0000 mg | ORAL_TABLET | Freq: Three times a day (TID) | ORAL | Status: DC | PRN

## 2012-06-04 NOTE — Progress Notes (Signed)
Subjective:    Patient ID: Emily Peck, female    DOB: December 11, 1955, 57 y.o.   MRN: [id number]  HPI Patient complaining of left-sided back spasms. She had good result with her L3-L4-L5 radiofrequency done in February of 2014. No pain in that region. Her left-sided back spasms are more in the upper lumbar area right under her bra. Pain Inventory Average Pain 6 Pain Right Now 4 My pain is intermittent, sharp and dull  In the last 24 hours, has pain interfered with the following? General activity 4 Relation with others 4 Enjoyment of life 4 What TIME of day is your pain at its worst? daytime Sleep (in general) Good  Pain is worse with: walking, standing and some activites Pain improves with: rest, heat/ice and medication Relief from Meds: 3  Mobility use a walker ability to climb steps?  yes do you drive?  no  Function disabled: date disabled see chart I need assistance with the following:  meal prep, household duties and shopping  Neuro/Psych spasms  Prior Studies Any changes since last visit?  no  Physicians involved in your care Any changes since last visit?  no   Family History  Problem Relation Age of Onset  . Cancer Mother   . Diabetes Mother    History   Social History  . Marital Status: Single    Spouse Name: N/A    Number of Children: N/A  . Years of Education: N/A   Social History Main Topics  . Smoking status: Never Smoker   . Smokeless tobacco: Never Used  . Alcohol Use: No  . Drug Use: No  . Sexually Active: None   Other Topics Concern  . None   Social History Narrative  . None   Past Surgical History  Procedure Laterality Date  . Back surgery    . Knee arthroscopy     Past Medical History  Diagnosis Date  . Stroke   . Anxiety   . Postlaminectomy syndrome, thoracic region   . Postlaminectomy syndrome, lumbar region   . Lumbosacral spondylosis without myelopathy    BP 108/72  Pulse 60  Resp 14  Ht 5\' 6"  (1.676 m)  Wt 196  lb (88.905 kg)  BMI 31.65 kg/m2  SpO2 99%     Review of Systems  Musculoskeletal:       Spasms are very bad  All other systems reviewed and are negative.       Objective:   Physical Exam  Nursing note and vitals reviewed. Constitutional: She is oriented to person, place, and time. She appears well-developed.  obese  HENT:  Head: Normocephalic and atraumatic.  Eyes: Conjunctivae are normal. Pupils are equal, round, and reactive to light.  strabismus  Neck: Normal range of motion.  Musculoskeletal:       Lumbar back: She exhibits decreased range of motion and tenderness. She exhibits no deformity.  L1, L2, L3 paraspinal muscle tenderness of the left side  Neurological: She is alert and oriented to person, place, and time. She has normal strength. She exhibits normal muscle tone.  Reflex Scores:      Patellar reflexes are 3+ on the right side and 2+ on the left side.      Achilles reflexes are 3+ on the right side and 2+ on the left side.         Assessment & Plan:  1. Lumbar spondylosis with improvements of left sided low back pain after radiofrequency neurotomy L3-L4-L5 2. Upper lumbar left  paraspinal muscle tenderness myofascial pain. Will do trigger point injections as well as prescription for Zanaflex 2 mg 3 times a day. RTC 1 month  Left L1 L2-L3 paraspinal trigger point injections Indication is left-sided upper lumbar low back pain not relieved by analgesic medications Informed consent was obtained at scarring risks and benefits of the procedure with the patient these include bleeding bruising and infection she elects to proceed and has given written consent Patient placed prone on exam table areas marked and prepped with Betadine alcohol and her with 25-gauge 1.5 inch needle after negative back for blood 1 cc 1% lidocaine injected into each of 3 spots. Patient tolerated procedure well. Small pea-sized hematoma on the left L3. Informed patient. May continue Coumadin

## 2012-06-16 DIAGNOSIS — I749 Embolism and thrombosis of unspecified artery: Secondary | ICD-10-CM | POA: Diagnosis not present

## 2012-06-16 DIAGNOSIS — Z79899 Other long term (current) drug therapy: Secondary | ICD-10-CM | POA: Diagnosis not present

## 2012-06-16 DIAGNOSIS — Z7901 Long term (current) use of anticoagulants: Secondary | ICD-10-CM | POA: Diagnosis not present

## 2012-06-27 DIAGNOSIS — M654 Radial styloid tenosynovitis [de Quervain]: Secondary | ICD-10-CM | POA: Diagnosis not present

## 2012-06-27 DIAGNOSIS — M545 Low back pain: Secondary | ICD-10-CM | POA: Diagnosis not present

## 2012-07-03 ENCOUNTER — Ambulatory Visit: Payer: Medicare Other | Admitting: Physical Medicine & Rehabilitation

## 2012-07-21 DIAGNOSIS — I749 Embolism and thrombosis of unspecified artery: Secondary | ICD-10-CM | POA: Diagnosis not present

## 2012-07-21 DIAGNOSIS — M659 Synovitis and tenosynovitis, unspecified: Secondary | ICD-10-CM | POA: Diagnosis not present

## 2012-07-21 DIAGNOSIS — G5750 Tarsal tunnel syndrome, unspecified lower limb: Secondary | ICD-10-CM | POA: Diagnosis not present

## 2012-07-21 DIAGNOSIS — G909 Disorder of the autonomic nervous system, unspecified: Secondary | ICD-10-CM | POA: Diagnosis not present

## 2012-07-21 DIAGNOSIS — IMO0002 Reserved for concepts with insufficient information to code with codable children: Secondary | ICD-10-CM | POA: Diagnosis not present

## 2012-07-21 DIAGNOSIS — I8000 Phlebitis and thrombophlebitis of superficial vessels of unspecified lower extremity: Secondary | ICD-10-CM | POA: Diagnosis not present

## 2012-07-21 DIAGNOSIS — R350 Frequency of micturition: Secondary | ICD-10-CM | POA: Diagnosis not present

## 2012-07-21 DIAGNOSIS — Z7901 Long term (current) use of anticoagulants: Secondary | ICD-10-CM | POA: Diagnosis not present

## 2012-09-02 DIAGNOSIS — H35379 Puckering of macula, unspecified eye: Secondary | ICD-10-CM | POA: Diagnosis not present

## 2012-09-02 DIAGNOSIS — G909 Disorder of the autonomic nervous system, unspecified: Secondary | ICD-10-CM | POA: Diagnosis not present

## 2012-09-02 DIAGNOSIS — H4420 Degenerative myopia, unspecified eye: Secondary | ICD-10-CM | POA: Diagnosis not present

## 2012-09-02 DIAGNOSIS — I749 Embolism and thrombosis of unspecified artery: Secondary | ICD-10-CM | POA: Diagnosis not present

## 2012-09-02 DIAGNOSIS — H04129 Dry eye syndrome of unspecified lacrimal gland: Secondary | ICD-10-CM | POA: Diagnosis not present

## 2012-09-02 DIAGNOSIS — H543 Unqualified visual loss, both eyes: Secondary | ICD-10-CM | POA: Diagnosis not present

## 2012-09-02 DIAGNOSIS — Z79899 Other long term (current) drug therapy: Secondary | ICD-10-CM | POA: Diagnosis not present

## 2012-09-21 DIAGNOSIS — F3130 Bipolar disorder, current episode depressed, mild or moderate severity, unspecified: Secondary | ICD-10-CM | POA: Diagnosis not present

## 2012-09-23 DIAGNOSIS — M79609 Pain in unspecified limb: Secondary | ICD-10-CM | POA: Diagnosis not present

## 2012-09-23 DIAGNOSIS — E119 Type 2 diabetes mellitus without complications: Secondary | ICD-10-CM | POA: Diagnosis not present

## 2012-09-23 DIAGNOSIS — IMO0002 Reserved for concepts with insufficient information to code with codable children: Secondary | ICD-10-CM | POA: Diagnosis not present

## 2012-09-23 DIAGNOSIS — G609 Hereditary and idiopathic neuropathy, unspecified: Secondary | ICD-10-CM | POA: Diagnosis not present

## 2012-09-25 DIAGNOSIS — G5750 Tarsal tunnel syndrome, unspecified lower limb: Secondary | ICD-10-CM | POA: Diagnosis not present

## 2012-09-25 DIAGNOSIS — IMO0002 Reserved for concepts with insufficient information to code with codable children: Secondary | ICD-10-CM | POA: Diagnosis not present

## 2012-09-25 DIAGNOSIS — M7150 Other bursitis, not elsewhere classified, unspecified site: Secondary | ICD-10-CM | POA: Diagnosis not present

## 2012-10-19 ENCOUNTER — Ambulatory Visit: Payer: Medicare Other | Admitting: Physical Medicine & Rehabilitation

## 2012-10-21 ENCOUNTER — Other Ambulatory Visit: Payer: Self-pay

## 2012-10-21 MED ORDER — TRAMADOL HCL 50 MG PO TABS: 50.0000 mg | ORAL_TABLET | Freq: Three times a day (TID) | ORAL | Status: DC | PRN

## 2012-11-06 ENCOUNTER — Encounter: Payer: Self-pay | Admitting: Physical Medicine & Rehabilitation

## 2012-11-06 ENCOUNTER — Telehealth: Payer: Self-pay | Admitting: Physical Medicine & Rehabilitation

## 2012-11-06 ENCOUNTER — Ambulatory Visit (HOSPITAL_BASED_OUTPATIENT_CLINIC_OR_DEPARTMENT_OTHER): Payer: Medicare Other | Admitting: Physical Medicine & Rehabilitation

## 2012-11-06 VITALS — BP 129/76 | HR 66 | Resp 14 | Ht 66.0 in | Wt 199.4 lb

## 2012-11-06 DIAGNOSIS — M76899 Other specified enthesopathies of unspecified lower limb, excluding foot: Secondary | ICD-10-CM

## 2012-11-06 DIAGNOSIS — IMO0002 Reserved for concepts with insufficient information to code with codable children: Secondary | ICD-10-CM | POA: Diagnosis not present

## 2012-11-06 DIAGNOSIS — G5750 Tarsal tunnel syndrome, unspecified lower limb: Secondary | ICD-10-CM | POA: Diagnosis not present

## 2012-11-06 DIAGNOSIS — M7061 Trochanteric bursitis, right hip: Secondary | ICD-10-CM | POA: Insufficient documentation

## 2012-11-06 DIAGNOSIS — I749 Embolism and thrombosis of unspecified artery: Secondary | ICD-10-CM | POA: Diagnosis not present

## 2012-11-06 DIAGNOSIS — G909 Disorder of the autonomic nervous system, unspecified: Secondary | ICD-10-CM | POA: Diagnosis not present

## 2012-11-06 DIAGNOSIS — Z7901 Long term (current) use of anticoagulants: Secondary | ICD-10-CM | POA: Diagnosis not present

## 2012-11-06 DIAGNOSIS — M659 Synovitis and tenosynovitis, unspecified: Secondary | ICD-10-CM | POA: Diagnosis not present

## 2012-11-06 DIAGNOSIS — M25579 Pain in unspecified ankle and joints of unspecified foot: Secondary | ICD-10-CM | POA: Diagnosis not present

## 2012-11-06 DIAGNOSIS — Z79899 Other long term (current) drug therapy: Secondary | ICD-10-CM | POA: Diagnosis not present

## 2012-11-06 DIAGNOSIS — M545 Low back pain: Secondary | ICD-10-CM | POA: Diagnosis not present

## 2012-11-06 DIAGNOSIS — M533 Sacrococcygeal disorders, not elsewhere classified: Secondary | ICD-10-CM | POA: Diagnosis not present

## 2012-11-06 DIAGNOSIS — M961 Postlaminectomy syndrome, not elsewhere classified: Secondary | ICD-10-CM | POA: Insufficient documentation

## 2012-11-06 NOTE — Patient Instructions (Addendum)
Get x-ray of your back before you come back

## 2012-11-06 NOTE — Progress Notes (Signed)
Subjective:    Patient ID: Emily Peck, female    DOB: [birth year], 57 y.o.   MRN: [id number]  HPI Her bilateral hip pain, tailbone pain, improves with laying down. Worse with sitting. Pain No trauma history.  Inventory Average Pain 7 Pain Right Now 7 My pain is constant  In the last 24 hours, has pain interfered with the following? General activity 8 Relation with others 8 Enjoyment of life 9 What TIME of day is your pain at its worst? daytime and evening Sleep (in general) Poor  Pain is worse with: walking, sitting and standing Pain improves with: medication, TENS and injections Relief from Meds: 3  Mobility use a cane  Function disabled: date disabled .  Neuro/Psych numbness spasms  Prior Studies Any changes since last visit?  no  Physicians involved in your care Any changes since last visit?  no   Family History  Problem Relation Age of Onset  . Cancer Mother   . Diabetes Mother    History   Social History  . Marital Status: Single    Spouse Name: N/A    Number of Children: N/A  . Years of Education: N/A   Social History Main Topics  . Smoking status: Never Smoker   . Smokeless tobacco: Never Used  . Alcohol Use: No  . Drug Use: No  . Sexual Activity: None   Other Topics Concern  . None   Social History Narrative  . None   Past Surgical History  Procedure Laterality Date  . Back surgery    . Knee arthroscopy     Past Medical History  Diagnosis Date  . Stroke   . Anxiety   . Postlaminectomy syndrome, thoracic region   . Postlaminectomy syndrome, lumbar region   . Lumbosacral spondylosis without myelopathy    BP 129/76  Pulse 66  Resp 14  Ht 5\' 6"  (1.676 m)  Wt 199 lb 6.4 oz (90.447 kg)  BMI 32.2 kg/m2  SpO2 98%    Review of Systems  Respiratory: Positive for shortness of breath.   Musculoskeletal: Positive for back pain and gait problem.       Spasms  Neurological: Positive for numbness.  All other systems reviewed  and are negative.       Objective:   Physical Exam  Nursing note and vitals reviewed. Constitutional: She is oriented to person, place, and time. She appears well-developed and well-nourished.  HENT:  Head: Normocephalic and atraumatic.  Eyes: Conjunctivae are normal. Pupils are equal, round, and reactive to light.  Neck: Normal range of motion.  Musculoskeletal:       Right hip: She exhibits tenderness.       Left hip: She exhibits tenderness.       Lumbar back: She exhibits bony tenderness.  Sacral pain to palpation  Neurological: She is alert and oriented to person, place, and time.  Psychiatric: She has a normal mood and affect.   Pain to palpation of bilateral greater trochanter's. Negative straight leg raising test       Assessment & Plan:  1. Bilateral trochanteric bursitis has pain that is only partially responsive to medication management. Will inject today.  2. Sacral pain also tailbone pain. We'll check x-rays. Further treatment based on x-ray results  Trochanteric bursa injection Bilateral  without ultrasound guidance  Indication Trochanteric bursitis. Exam has tenderness over the greater trochanter of the hip. Pain has not responded to conservative care such as exercise therapy and oral medications. Pain interferes  with sleep or with mobility Informed consent was obtained after describing risks and benefits of the procedure with the patient these include bleeding bruising and infection. Patient has signed written consent form. Patient placed in a  lateral decubitus position with the affected hip superior. Point of maximal pain was palpated marked and prepped with Betadine and entered with a needle to bone contact. Needle slightly withdrawn then 40 mg Depo-Medrol with 4 cc 1% lidocaine were injected. Patient tolerated procedure well. Post procedure instructions given. this procedure was first performed on the right and then on the left

## 2012-11-07 DIAGNOSIS — M1710 Unilateral primary osteoarthritis, unspecified knee: Secondary | ICD-10-CM | POA: Diagnosis not present

## 2012-11-14 ENCOUNTER — Other Ambulatory Visit: Payer: Self-pay | Admitting: Physical Medicine & Rehabilitation

## 2012-11-14 ENCOUNTER — Ambulatory Visit (HOSPITAL_COMMUNITY): Payer: Medicare Other

## 2012-11-14 ENCOUNTER — Ambulatory Visit (HOSPITAL_COMMUNITY)
Admission: RE | Admit: 2012-11-14 | Discharge: 2012-11-14 | Disposition: A | Discharge status: Home / Self Care | Payer: Medicare Other | Source: Ambulatory Visit | Attending: Physical Medicine & Rehabilitation | Admitting: Physical Medicine & Rehabilitation

## 2012-11-14 DIAGNOSIS — Z981 Arthrodesis status: Secondary | ICD-10-CM | POA: Diagnosis not present

## 2012-11-14 DIAGNOSIS — M545 Low back pain: Secondary | ICD-10-CM | POA: Diagnosis not present

## 2012-11-14 DIAGNOSIS — R52 Pain, unspecified: Secondary | ICD-10-CM | POA: Insufficient documentation

## 2012-11-17 ENCOUNTER — Ambulatory Visit: Payer: Medicare Other | Admitting: Physical Medicine & Rehabilitation

## 2012-11-20 ENCOUNTER — Encounter: Payer: Medicare Other | Attending: Physical Medicine & Rehabilitation

## 2012-11-20 ENCOUNTER — Encounter: Payer: Self-pay | Admitting: Physical Medicine & Rehabilitation

## 2012-11-20 ENCOUNTER — Ambulatory Visit (HOSPITAL_BASED_OUTPATIENT_CLINIC_OR_DEPARTMENT_OTHER): Payer: Medicare Other | Admitting: Physical Medicine & Rehabilitation

## 2012-11-20 VITALS — BP 114/69 | HR 84 | Resp 14 | Ht 65.0 in | Wt 196.0 lb

## 2012-11-20 DIAGNOSIS — M533 Sacrococcygeal disorders, not elsewhere classified: Secondary | ICD-10-CM | POA: Diagnosis not present

## 2012-11-20 DIAGNOSIS — M76899 Other specified enthesopathies of unspecified lower limb, excluding foot: Secondary | ICD-10-CM | POA: Diagnosis not present

## 2012-11-20 DIAGNOSIS — M961 Postlaminectomy syndrome, not elsewhere classified: Secondary | ICD-10-CM

## 2012-11-20 DIAGNOSIS — Z7901 Long term (current) use of anticoagulants: Secondary | ICD-10-CM | POA: Diagnosis not present

## 2012-11-20 DIAGNOSIS — I749 Embolism and thrombosis of unspecified artery: Secondary | ICD-10-CM | POA: Diagnosis not present

## 2012-11-20 NOTE — Progress Notes (Signed)
  Subjective:    Patient ID: Emily Peck, female    DOB: [birth year], 57 y.o.   MRN: [id number]  HPI Lumbar spine films revealed no fracture. Patient has permission to be off warfarin INR today 0.9 Pain Inventory Average Pain 6 Pain Right Now 7 My pain is sharp and stabbing  In the last 24 hours, has pain interfered with the following? General activity 3 Relation with others 3 Enjoyment of life 4 What TIME of day is your pain at its worst? day and evening Sleep (in general) Fair  Pain is worse with: walking, bending, sitting and standing Pain improves with: medication and injections Relief from Meds: 1  Mobility use a cane  Function not employed: date last employed .  Neuro/Psych No problems in this area  Prior Studies x-rays  Physicians involved in your care Any changes since last visit?  no   Family History  Problem Relation Age of Onset  . Cancer Mother   . Diabetes Mother    History   Social History  . Marital Status: Single    Spouse Name: N/A    Number of Children: N/A  . Years of Education: N/A   Social History Main Topics  . Smoking status: Never Smoker   . Smokeless tobacco: Never Used  . Alcohol Use: No  . Drug Use: No  . Sexual Activity: None   Other Topics Concern  . None   Social History Narrative  . None   Past Surgical History  Procedure Laterality Date  . Back surgery    . Knee arthroscopy     Past Medical History  Diagnosis Date  . Stroke   . Anxiety   . Postlaminectomy syndrome, thoracic region   . Postlaminectomy syndrome, lumbar region   . Lumbosacral spondylosis without myelopathy    BP 114/69  Pulse 84  Resp 14  Ht 5\' 5"  (1.651 m)  Wt 196 lb (88.905 kg)  BMI 32.62 kg/m2  SpO2 98%     Review of Systems  Musculoskeletal: Positive for back pain.  All other systems reviewed and are negative.       Objective:   Physical Exam  Tenderness lower sacral area      Assessment & Plan:  Postlaminectomy  syndrome as well as sacral pain. Will do a caudal epidural injection  Caudal epidural injection Informed consent was obtained after describing risks and benefits of the procedure the patient is include bleeding bruising and infection she elects to proceed and has given written consent. Patient placed prone on fluoroscopy table Betadine prep Sterile drape 25-gauge 1.5 inch needle was used to anesthetize the skin and subcutaneous tissue after identifying the sacral hiatus on fluoroscopic AP views Then a 22-gauge 1.5 inch needle was inserted into the sacral hiatus. Lateral views were utilized to maneuver the needle into the sacral canal Under AP views 3 cc of Omnipaque 180 injected Then a solution containing 12 mg of Celestone and 3 cc of 1% NP of lidocaine were injected. Patient tolerated procedure well Post procedure instructions given Resume Coumadin tonight

## 2012-11-20 NOTE — Patient Instructions (Addendum)
May resume warfarin tonite 

## 2012-11-20 NOTE — Progress Notes (Signed)
  PROCEDURE RECORD [facility]   Name: Emily Peck DOB:1955/06/04 MRN: [id number]  Date:11/20/2012  Physician: Claudette Laws, MD    Nurse/CMA: Kelli Churn, CMA  Allergies: No Known Allergies  Consent Signed: yes  Is patient diabetic? no    Pregnant: no LMP: No LMP recorded. Patient has had a hysterectomy. (age 57-55)  Anticoagulants: yes (stopped a week ago) Anti-inflammatory: no Antibiotics: no  Procedure: Caudal Epidural  Position: Prone Start Time: 1155  End Time: 1213  Fluoro Time: 20  RN/CMA  Levens, CMA    Time  1215    BP  125/63    Pulse  70    Respirations  14    O2 Sat  100    S/S  6    Pain Level       D/C home with SCAT, patient A & O X 3, D/C instructions reviewed, and sits independently.

## 2012-11-27 DIAGNOSIS — M722 Plantar fascial fibromatosis: Secondary | ICD-10-CM | POA: Diagnosis not present

## 2012-11-27 DIAGNOSIS — M659 Synovitis and tenosynovitis, unspecified: Secondary | ICD-10-CM | POA: Diagnosis not present

## 2012-11-27 DIAGNOSIS — M65979 Unspecified synovitis and tenosynovitis, unspecified ankle and foot: Secondary | ICD-10-CM | POA: Diagnosis not present

## 2012-11-27 DIAGNOSIS — M25579 Pain in unspecified ankle and joints of unspecified foot: Secondary | ICD-10-CM | POA: Diagnosis not present

## 2012-11-30 DIAGNOSIS — E559 Vitamin D deficiency, unspecified: Secondary | ICD-10-CM | POA: Diagnosis not present

## 2012-11-30 DIAGNOSIS — Z0000 Encounter for general adult medical examination without abnormal findings: Secondary | ICD-10-CM | POA: Diagnosis not present

## 2012-11-30 DIAGNOSIS — Z8673 Personal history of transient ischemic attack (TIA), and cerebral infarction without residual deficits: Secondary | ICD-10-CM | POA: Diagnosis not present

## 2012-11-30 DIAGNOSIS — F331 Major depressive disorder, recurrent, moderate: Secondary | ICD-10-CM | POA: Diagnosis not present

## 2012-11-30 DIAGNOSIS — Z79899 Other long term (current) drug therapy: Secondary | ICD-10-CM | POA: Diagnosis not present

## 2012-11-30 DIAGNOSIS — I749 Embolism and thrombosis of unspecified artery: Secondary | ICD-10-CM | POA: Diagnosis not present

## 2012-11-30 DIAGNOSIS — I679 Cerebrovascular disease, unspecified: Secondary | ICD-10-CM | POA: Diagnosis not present

## 2012-11-30 DIAGNOSIS — R7309 Other abnormal glucose: Secondary | ICD-10-CM | POA: Diagnosis not present

## 2012-11-30 DIAGNOSIS — G909 Disorder of the autonomic nervous system, unspecified: Secondary | ICD-10-CM | POA: Diagnosis not present

## 2012-11-30 DIAGNOSIS — E669 Obesity, unspecified: Secondary | ICD-10-CM | POA: Diagnosis not present

## 2012-11-30 DIAGNOSIS — Z7901 Long term (current) use of anticoagulants: Secondary | ICD-10-CM | POA: Diagnosis not present

## 2012-12-01 DIAGNOSIS — R0609 Other forms of dyspnea: Secondary | ICD-10-CM | POA: Diagnosis not present

## 2012-12-01 DIAGNOSIS — Z6835 Body mass index (BMI) 35.0-35.9, adult: Secondary | ICD-10-CM | POA: Diagnosis not present

## 2012-12-01 DIAGNOSIS — E559 Vitamin D deficiency, unspecified: Secondary | ICD-10-CM | POA: Diagnosis not present

## 2012-12-01 DIAGNOSIS — E669 Obesity, unspecified: Secondary | ICD-10-CM | POA: Diagnosis not present

## 2012-12-01 DIAGNOSIS — M899 Disorder of bone, unspecified: Secondary | ICD-10-CM | POA: Diagnosis not present

## 2012-12-07 DIAGNOSIS — F3130 Bipolar disorder, current episode depressed, mild or moderate severity, unspecified: Secondary | ICD-10-CM | POA: Diagnosis not present

## 2012-12-14 ENCOUNTER — Telehealth: Payer: Self-pay

## 2012-12-14 NOTE — Telephone Encounter (Signed)
Patient requesting a refill on Zanaflex and Tramadol. Patient also states she is having muscle spasms and she was given a medication before to help with them and wants to know if she could get another RX.

## 2012-12-15 ENCOUNTER — Ambulatory Visit: Payer: Medicare Other | Admitting: Physical Medicine & Rehabilitation

## 2012-12-15 ENCOUNTER — Other Ambulatory Visit: Payer: Self-pay

## 2012-12-15 MED ORDER — TRAMADOL HCL 50 MG PO TABS: 50.0000 mg | ORAL_TABLET | Freq: Three times a day (TID) | ORAL | Status: DC | PRN

## 2012-12-15 MED ORDER — TIZANIDINE HCL 4 MG PO TABS: 2.0000 mg | ORAL_TABLET | Freq: Three times a day (TID) | ORAL | Status: DC | PRN

## 2012-12-15 NOTE — Telephone Encounter (Signed)
Zanaflex and tramadol refilled.  Please advise what muscle relaxer to fill.

## 2012-12-16 ENCOUNTER — Other Ambulatory Visit: Payer: Self-pay | Admitting: Physical Medicine & Rehabilitation

## 2012-12-16 MED ORDER — TIZANIDINE HCL 4 MG PO TABS: 2.0000 mg | ORAL_TABLET | Freq: Three times a day (TID) | ORAL | Status: DC | PRN

## 2012-12-25 DIAGNOSIS — R079 Chest pain, unspecified: Secondary | ICD-10-CM | POA: Diagnosis not present

## 2012-12-25 DIAGNOSIS — M25559 Pain in unspecified hip: Secondary | ICD-10-CM | POA: Diagnosis not present

## 2012-12-28 DIAGNOSIS — Z23 Encounter for immunization: Secondary | ICD-10-CM | POA: Diagnosis not present

## 2012-12-28 DIAGNOSIS — M1710 Unilateral primary osteoarthritis, unspecified knee: Secondary | ICD-10-CM | POA: Diagnosis not present

## 2013-01-13 DIAGNOSIS — H4420 Degenerative myopia, unspecified eye: Secondary | ICD-10-CM | POA: Diagnosis not present

## 2013-02-24 DIAGNOSIS — E559 Vitamin D deficiency, unspecified: Secondary | ICD-10-CM | POA: Diagnosis not present

## 2013-02-24 DIAGNOSIS — K219 Gastro-esophageal reflux disease without esophagitis: Secondary | ICD-10-CM | POA: Diagnosis not present

## 2013-02-24 DIAGNOSIS — R030 Elevated blood-pressure reading, without diagnosis of hypertension: Secondary | ICD-10-CM | POA: Diagnosis not present

## 2013-02-24 DIAGNOSIS — Z7901 Long term (current) use of anticoagulants: Secondary | ICD-10-CM | POA: Diagnosis not present

## 2013-02-24 DIAGNOSIS — J329 Chronic sinusitis, unspecified: Secondary | ICD-10-CM | POA: Diagnosis not present

## 2013-02-24 DIAGNOSIS — M129 Arthropathy, unspecified: Secondary | ICD-10-CM | POA: Diagnosis not present

## 2013-02-24 DIAGNOSIS — Z79899 Other long term (current) drug therapy: Secondary | ICD-10-CM | POA: Diagnosis not present

## 2013-03-01 DIAGNOSIS — K219 Gastro-esophageal reflux disease without esophagitis: Secondary | ICD-10-CM | POA: Diagnosis not present

## 2013-03-01 DIAGNOSIS — E559 Vitamin D deficiency, unspecified: Secondary | ICD-10-CM | POA: Diagnosis not present

## 2013-03-01 DIAGNOSIS — M129 Arthropathy, unspecified: Secondary | ICD-10-CM | POA: Diagnosis not present

## 2013-03-08 DIAGNOSIS — F315 Bipolar disorder, current episode depressed, severe, with psychotic features: Secondary | ICD-10-CM | POA: Diagnosis not present

## 2013-03-30 ENCOUNTER — Other Ambulatory Visit (HOSPITAL_COMMUNITY): Payer: Self-pay | Admitting: Internal Medicine

## 2013-03-30 ENCOUNTER — Telehealth: Payer: Self-pay

## 2013-03-30 DIAGNOSIS — M533 Sacrococcygeal disorders, not elsewhere classified: Secondary | ICD-10-CM

## 2013-03-30 DIAGNOSIS — Z1231 Encounter for screening mammogram for malignant neoplasm of breast: Secondary | ICD-10-CM

## 2013-03-30 NOTE — Telephone Encounter (Signed)
Please order PT eval for TENS

## 2013-03-30 NOTE — Telephone Encounter (Signed)
Patient is requesting a TENS unit

## 2013-03-31 NOTE — Telephone Encounter (Signed)
Referral for PT/Tens unit placed.

## 2013-04-02 ENCOUNTER — Ambulatory Visit (HOSPITAL_COMMUNITY): Payer: Medicare Other

## 2013-04-05 DIAGNOSIS — M1710 Unilateral primary osteoarthritis, unspecified knee: Secondary | ICD-10-CM | POA: Diagnosis not present

## 2013-04-05 DIAGNOSIS — M25569 Pain in unspecified knee: Secondary | ICD-10-CM | POA: Diagnosis not present

## 2013-04-19 ENCOUNTER — Ambulatory Visit (HOSPITAL_COMMUNITY)
Admission: RE | Admit: 2013-04-19 | Discharge: 2013-04-19 | Disposition: A | Discharge status: Home / Self Care | Payer: Medicare Other | Source: Ambulatory Visit | Attending: Internal Medicine | Admitting: Internal Medicine

## 2013-04-19 DIAGNOSIS — Z1231 Encounter for screening mammogram for malignant neoplasm of breast: Secondary | ICD-10-CM | POA: Insufficient documentation

## 2013-04-22 ENCOUNTER — Other Ambulatory Visit: Payer: Self-pay | Admitting: Internal Medicine

## 2013-04-22 DIAGNOSIS — R928 Other abnormal and inconclusive findings on diagnostic imaging of breast: Secondary | ICD-10-CM

## 2013-05-05 ENCOUNTER — Ambulatory Visit
Admission: RE | Admit: 2013-05-05 | Discharge: 2013-05-05 | Disposition: A | Discharge status: Home / Self Care | Payer: Medicare Other | Source: Ambulatory Visit | Attending: Internal Medicine | Admitting: Internal Medicine

## 2013-05-05 DIAGNOSIS — R928 Other abnormal and inconclusive findings on diagnostic imaging of breast: Secondary | ICD-10-CM | POA: Diagnosis not present

## 2013-06-11 ENCOUNTER — Encounter: Payer: Medicare Other | Attending: Physical Medicine & Rehabilitation

## 2013-06-11 ENCOUNTER — Encounter: Payer: Self-pay | Admitting: Physical Medicine & Rehabilitation

## 2013-06-11 ENCOUNTER — Ambulatory Visit (HOSPITAL_BASED_OUTPATIENT_CLINIC_OR_DEPARTMENT_OTHER): Payer: Medicare Other | Admitting: Physical Medicine & Rehabilitation

## 2013-06-11 VITALS — BP 146/82 | HR 79 | Resp 16 | Ht 66.0 in | Wt 201.0 lb

## 2013-06-11 DIAGNOSIS — R209 Unspecified disturbances of skin sensation: Secondary | ICD-10-CM | POA: Diagnosis not present

## 2013-06-11 DIAGNOSIS — Z8673 Personal history of transient ischemic attack (TIA), and cerebral infarction without residual deficits: Secondary | ICD-10-CM | POA: Diagnosis not present

## 2013-06-11 DIAGNOSIS — M2142 Flat foot [pes planus] (acquired), left foot: Secondary | ICD-10-CM

## 2013-06-11 DIAGNOSIS — Z7901 Long term (current) use of anticoagulants: Secondary | ICD-10-CM | POA: Diagnosis not present

## 2013-06-11 DIAGNOSIS — Z79899 Other long term (current) drug therapy: Secondary | ICD-10-CM | POA: Diagnosis not present

## 2013-06-11 DIAGNOSIS — M25569 Pain in unspecified knee: Secondary | ICD-10-CM | POA: Diagnosis not present

## 2013-06-11 DIAGNOSIS — J329 Chronic sinusitis, unspecified: Secondary | ICD-10-CM | POA: Diagnosis not present

## 2013-06-11 DIAGNOSIS — Z5181 Encounter for therapeutic drug level monitoring: Secondary | ICD-10-CM | POA: Insufficient documentation

## 2013-06-11 DIAGNOSIS — M2140 Flat foot [pes planus] (acquired), unspecified foot: Secondary | ICD-10-CM

## 2013-06-11 DIAGNOSIS — G8929 Other chronic pain: Secondary | ICD-10-CM

## 2013-06-11 DIAGNOSIS — M961 Postlaminectomy syndrome, not elsewhere classified: Secondary | ICD-10-CM | POA: Insufficient documentation

## 2013-06-11 DIAGNOSIS — M2141 Flat foot [pes planus] (acquired), right foot: Secondary | ICD-10-CM | POA: Insufficient documentation

## 2013-06-11 DIAGNOSIS — M542 Cervicalgia: Secondary | ICD-10-CM | POA: Diagnosis not present

## 2013-06-11 DIAGNOSIS — M533 Sacrococcygeal disorders, not elsewhere classified: Secondary | ICD-10-CM | POA: Diagnosis not present

## 2013-06-11 MED ORDER — TRAMADOL HCL 50 MG PO TABS: 100.0000 mg | ORAL_TABLET | Freq: Two times a day (BID) | ORAL | Status: DC

## 2013-06-11 NOTE — Patient Instructions (Signed)
Will check urine screen today  tramadol 2 tablets twice a day do not use aleve or Goody's powders but may use Tylenol Will review urine results when available next week  Next visit is for Back injection  Need to be off coumadin for 5-7 days and check Protime INR within 24 hours of injection

## 2013-06-11 NOTE — Progress Notes (Signed)
Subjective:    Patient ID: Emily Peck, female    DOB: 12-14-1955, 58 y.o.   MRN: [id number]  HPI Caudal ESI 11/20/2012 not helpful for back pain did help leg pain for over a month  Chief complaint is mid back pain. She's had a thoracic fusion in the past. Has had T12 L1-L2 medial branch blocks in the past which were helpful, last injection done several years ago her pain went from a 5/10 to a 0/10  Secondary complaint is bilateral knee pain. Has had injections by Dr. Rhona Raider, he does not remember the last time she had one.  Now complaining of arm and hand problems also has neck pain, numbness in hand Has problems holding phone and wakes up at night with numbness    Pain Inventory Average Pain na Pain Right Now na My pain is na  In the last 24 hours, has pain interfered with the following? General activity 6 Relation with others 6 Enjoyment of life 6 What TIME of day is your pain at its worst? day, evening Sleep (in general) NA  Pain is worse with: walking, bending, sitting and standing Pain improves with: na Relief from Meds: 2  Mobility use a cane  Function I need assistance with the following:  household duties  Neuro/Psych tingling trouble walking spasms  Prior Studies Any changes since last visit?  no  Physicians involved in your care Any changes since last visit?  no   Family History  Problem Relation Age of Onset  . Cancer Mother   . Diabetes Mother    History   Social History  . Marital Status: Single    Spouse Name: N/A    Number of Children: N/A  . Years of Education: N/A   Social History Main Topics  . Smoking status: Never Smoker   . Smokeless tobacco: Never Used  . Alcohol Use: No  . Drug Use: No  . Sexual Activity: None   Other Topics Concern  . None   Social History Narrative  . None   Past Surgical History  Procedure Laterality Date  . Back surgery    . Knee arthroscopy     Past Medical History  Diagnosis Date  .  Stroke   . Anxiety   . Postlaminectomy syndrome, thoracic region   . Postlaminectomy syndrome, lumbar region   . Lumbosacral spondylosis without myelopathy    BP 146/82  Pulse 79  Resp 16  Ht 5\' 6"  (1.676 m)  Wt 201 lb (91.173 kg)  BMI 32.46 kg/m2  SpO2 99%  Opioid Risk Score:   1 on 06/11/2013 Fall Risk Score:     Review of Systems  Cardiovascular: Positive for leg swelling.  Musculoskeletal: Positive for gait problem and joint swelling.  Neurological:       Tingling, spasms   All other systems reviewed and are negative.      Objective:   Physical Exam  Back has tenderness in the upper lumbar paraspinal muscles. Her lumbar spine range of motion is limited with flexion, extension, lateral bending. Lower extremity strength is normal Knees have some tenderness over patellar tendon no effusion noted. No medial or lateral joint line tenderness. No instability with collateral ligament testing. Lower extremities without edema. Bilateral pes planus. No lesions in the foot. Hallux valgus noted     Assessment & Plan:  1. 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 She has had benefit from T12 L1-L2 medial branch blocks. We will repeat the use once  she comes off Coumadin. In addition she states he has been receiving chronic oxycodone from her orthopedist that he is no longer prescribing this period Opioid risk is low We'll check urine drug screen If urine drug screen is appropriate, will start Tylenol #4 Until that time tramadol 100 mg twice a day  2. Knee pain presumed osteoarthritis has been seeing orthopedist for this. None operative. May consider Synvisc injections or corticosteroid in the future  3. Neck pain hands numbness. not her major complaints today will followup at subsequent visit if these symptoms persist  4. Bilateral foot pain with evidence of pes planus will refer to podiatry

## 2013-06-15 ENCOUNTER — Telehealth: Payer: Self-pay

## 2013-06-15 NOTE — Telephone Encounter (Signed)
Patient is inquiring about her UDS results.  She is requesting a stronger medication for her pain she said the weather is making her pain worse. Please advise.

## 2013-06-15 NOTE — Telephone Encounter (Signed)
Please forward UDS results to me when available

## 2013-06-15 NOTE — Telephone Encounter (Signed)
Ms Emily Peck called back and I informed her that we do not have the decision yet since we have not received the UDS results yet.  She is very upset that we have not called her back but I explained that we did not have an answer to give her.  She is very angry and threatening to go somewhere else.  I told her that at this point she does not have a contract with [place] and if she needs to she ccan go to urgent care or ED.She hung up.

## 2013-06-17 ENCOUNTER — Telehealth: Payer: Self-pay

## 2013-06-17 DIAGNOSIS — F315 Bipolar disorder, current episode depressed, severe, with psychotic features: Secondary | ICD-10-CM | POA: Diagnosis not present

## 2013-06-17 MED ORDER — ACETAMINOPHEN-CODEINE #4 300-60 MG PO TABS: 1.0000 | ORAL_TABLET | Freq: Three times a day (TID) | ORAL | Status: DC | PRN

## 2013-06-17 NOTE — Telephone Encounter (Signed)
Call in tylenol #4 one po TID #90 2 refills

## 2013-06-17 NOTE — Telephone Encounter (Signed)
Patient is requesting something for pain.  Her urine drug screen came back consistent with no medications present.  Please advise.

## 2013-06-17 NOTE — Telephone Encounter (Signed)
Patient called upset that her UDS results are not back yet. She said "what is Dr. Letta Pate going to do" I explained to the patient that we were still waiting on the results, and he would not prescribe any medication until he gets the results back. Patient asked "if she had to keep suffering" I advised patient that if she needed to she could go to a Urgent Care to be evaluated. Patient was upset and hung up the phone.

## 2013-06-17 NOTE — Telephone Encounter (Signed)
Notified Emily Peck that we were calling in the tylenol #4 to her pharmacy

## 2013-06-28 ENCOUNTER — Encounter (HOSPITAL_COMMUNITY): Payer: Self-pay | Admitting: Emergency Medicine

## 2013-06-28 ENCOUNTER — Emergency Department (HOSPITAL_COMMUNITY): Payer: Medicare Other

## 2013-06-28 ENCOUNTER — Emergency Department (HOSPITAL_COMMUNITY)
Admission: EM | Admit: 2013-06-28 | Discharge: 2013-06-28 | Disposition: A | Discharge status: Home / Self Care | Payer: Medicare Other | Attending: Emergency Medicine | Admitting: Emergency Medicine

## 2013-06-28 DIAGNOSIS — Z7901 Long term (current) use of anticoagulants: Secondary | ICD-10-CM | POA: Insufficient documentation

## 2013-06-28 DIAGNOSIS — G8929 Other chronic pain: Secondary | ICD-10-CM

## 2013-06-28 DIAGNOSIS — M961 Postlaminectomy syndrome, not elsewhere classified: Secondary | ICD-10-CM | POA: Diagnosis not present

## 2013-06-28 DIAGNOSIS — M79609 Pain in unspecified limb: Secondary | ICD-10-CM | POA: Diagnosis not present

## 2013-06-28 DIAGNOSIS — M25569 Pain in unspecified knee: Secondary | ICD-10-CM

## 2013-06-28 DIAGNOSIS — R609 Edema, unspecified: Secondary | ICD-10-CM | POA: Diagnosis not present

## 2013-06-28 DIAGNOSIS — F411 Generalized anxiety disorder: Secondary | ICD-10-CM | POA: Diagnosis not present

## 2013-06-28 DIAGNOSIS — Z8673 Personal history of transient ischemic attack (TIA), and cerebral infarction without residual deficits: Secondary | ICD-10-CM | POA: Diagnosis not present

## 2013-06-28 DIAGNOSIS — I8000 Phlebitis and thrombophlebitis of superficial vessels of unspecified lower extremity: Secondary | ICD-10-CM | POA: Diagnosis not present

## 2013-06-28 DIAGNOSIS — M47817 Spondylosis without myelopathy or radiculopathy, lumbosacral region: Secondary | ICD-10-CM | POA: Diagnosis not present

## 2013-06-28 DIAGNOSIS — Z791 Long term (current) use of non-steroidal anti-inflammatories (NSAID): Secondary | ICD-10-CM | POA: Insufficient documentation

## 2013-06-28 DIAGNOSIS — Z79899 Other long term (current) drug therapy: Secondary | ICD-10-CM | POA: Diagnosis not present

## 2013-06-28 DIAGNOSIS — R040 Epistaxis: Secondary | ICD-10-CM | POA: Diagnosis not present

## 2013-06-28 DIAGNOSIS — R600 Localized edema: Secondary | ICD-10-CM

## 2013-06-28 DIAGNOSIS — M7989 Other specified soft tissue disorders: Secondary | ICD-10-CM

## 2013-06-28 DIAGNOSIS — J42 Unspecified chronic bronchitis: Secondary | ICD-10-CM | POA: Diagnosis not present

## 2013-06-28 LAB — URINALYSIS, ROUTINE W REFLEX MICROSCOPIC
Bilirubin Urine: NEGATIVE
Glucose, UA: NEGATIVE mg/dL
Hgb urine dipstick: NEGATIVE
Ketones, ur: NEGATIVE mg/dL
Leukocytes, UA: NEGATIVE
NITRITE: NEGATIVE
Protein, ur: NEGATIVE mg/dL
Specific Gravity, Urine: 1.025 (ref 1.005–1.030)
UROBILINOGEN UA: 0.2 mg/dL (ref 0.0–1.0)
pH: 7 (ref 5.0–8.0)

## 2013-06-28 LAB — I-STAT CHEM 8, ED
BUN: 9 mg/dL (ref 6–23)
Calcium, Ion: 1.29 mmol/L — ABNORMAL HIGH (ref 1.12–1.23)
Chloride: 108 mEq/L (ref 96–112)
Creatinine, Ser: 0.7 mg/dL (ref 0.50–1.10)
GLUCOSE: 77 mg/dL (ref 70–99)
HEMATOCRIT: 37 % (ref 36.0–46.0)
HEMOGLOBIN: 12.6 g/dL (ref 12.0–15.0)
Potassium: 3.7 mEq/L (ref 3.7–5.3)
Sodium: 143 mEq/L (ref 137–147)
TCO2: 21 mmol/L (ref 0–100)

## 2013-06-28 LAB — PRO B NATRIURETIC PEPTIDE: Pro B Natriuretic peptide (BNP): 218.5 pg/mL — ABNORMAL HIGH (ref 0–125)

## 2013-06-28 LAB — PROTIME-INR
INR: 3.69 — AB (ref 0.00–1.49)
PROTHROMBIN TIME: 35.2 s — AB (ref 11.6–15.2)

## 2013-06-28 MED ORDER — FUROSEMIDE 20 MG PO TABS: 20.0000 mg | ORAL_TABLET | Freq: Every day | ORAL | Status: DC

## 2013-06-28 MED ORDER — OXYCODONE-ACETAMINOPHEN 5-325 MG PO TABS
1.0000 | ORAL_TABLET | Freq: Once | ORAL | Status: AC
  Administered 2013-06-28: 1 via ORAL
  Filled 2013-06-28: qty 1

## 2013-06-28 NOTE — ED Notes (Signed)
Pt not in room at this time

## 2013-06-28 NOTE — ED Provider Notes (Signed)
CSN: [id number]     Arrival date & time 06/28/13  0941 History   First MD Initiated Contact with Patient 06/28/13 1036     Chief Complaint  Patient presents with  . Leg Swelling     (Consider location/radiation/quality/duration/timing/severity/associated sxs/prior Treatment) HPI Comments: Patient is a 58 year old female past medical history significant for stroke, anxiety presented to the emergency department from PCP office for evaluation of bilateral lower extremity swelling since Thursday. Patient states that she called the office prior to the weekend was initially advised to come to the emergency department but states, "I didn't want to deal with the ER on the weekends."  She states she has been taking her Tylenol fours with no improvement of her pain and swelling. Patient states Saturday evening she had about 10 minutes of central chest pain without radiation none since. She denies any new or worsening orthopnea or PND. Denies any fevers, chills, nausea, vomiting, shortness of breath, recent surgeries or traveling. No history of PE or DVT. Patient is on Coumadin for history of CVA.   Past Medical History  Diagnosis Date  . Stroke   . Anxiety   . Postlaminectomy syndrome, thoracic region   . Postlaminectomy syndrome, lumbar region   . Lumbosacral spondylosis without myelopathy    Past Surgical History  Procedure Laterality Date  . Back surgery    . Knee arthroscopy     Family History  Problem Relation Age of Onset  . Cancer Mother   . Diabetes Mother    History  Substance Use Topics  . Smoking status: Never Smoker   . Smokeless tobacco: Never Used  . Alcohol Use: No   OB History   Grav Para Term Preterm Abortions TAB SAB Ect Mult Living                 Review of Systems  Constitutional: Negative for fever and chills.  Respiratory: Negative for shortness of breath.   Cardiovascular: Positive for chest pain (resolved currently) and leg swelling.  Gastrointestinal:  Negative for nausea and vomiting.  All other systems reviewed and are negative.     Allergies  Review of patient's allergies indicates no known allergies.  Home Medications   Prior to Admission medications   Medication Sig Start Date End Date Taking? Authorizing Provider  acetaminophen-codeine (TYLENOL #4) 300-60 MG per tablet Take 1 tablet by mouth every 8 (eight) hours as needed for moderate pain. 06/17/13   Charlett Blake, MD  atorvastatin (LIPITOR) 10 MG tablet  04/29/13   Historical Provider, MD  citalopram (CELEXA) 20 MG tablet  05/11/13   Historical Provider, MD  Diclofenac Sodium 1.5 % SOLN  05/10/13   Historical Provider, MD  escitalopram (LEXAPRO) 20 MG tablet Take 20 mg by mouth daily.     Historical Provider, MD  gabapentin (NEURONTIN) 600 MG tablet Take 1 tablet by mouth Three times a day. 11/27/11   Historical Provider, MD  hydrochlorothiazide (HYDRODIURIL) 25 MG tablet Take 1 tablet by mouth daily. 04/29/12   Historical Provider, MD  omeprazole (PRILOSEC) 20 MG capsule  06/04/13   Historical Provider, MD  oxyCODONE-acetaminophen (PERCOCET/ROXICET) 5-325 MG per tablet Take 1 tablet by mouth Twice daily. 11/28/11   Historical Provider, MD  tiZANidine (ZANAFLEX) 4 MG tablet Take 0.5 tablets (2 mg total) by mouth every 8 (eight) hours as needed. 12/16/12   Charlett Blake, MD  topiramate (TOPAMAX) 50 MG tablet  02/24/12   Historical Provider, MD  traMADol (ULTRAM) 50 MG tablet  Take 2 tablets (100 mg total) by mouth 2 (two) times daily. 06/11/13   Charlett Blake, MD  traZODone (DESYREL) 50 MG tablet Take 100 mg by mouth at bedtime. For sleep.    Historical Provider, MD  warfarin (COUMADIN) 6 MG tablet Take 6-9 mg by mouth daily. Pt takes 6 mg on Tuesday, Thursday ,saturday, and sunday. Pt takes 9 mg on Monday, Wednesday and Friday.    Historical Provider, MD   BP 156/91  Pulse 57  Temp(Src) 97.9 F (36.6 C) (Oral)  Resp 16  Ht 5\' 6"  (1.676 m)  Wt 206 lb (93.441 kg)  BMI  33.27 kg/m2  SpO2 100% Physical Exam  Nursing note and vitals reviewed. Constitutional: She is oriented to person, place, and time. She appears well-developed and well-nourished. No distress.  HENT:  Head: Normocephalic and atraumatic.  Right Ear: External ear normal.  Left Ear: External ear normal.  Nose: Nose normal.  Mouth/Throat: Oropharynx is clear and moist. No oropharyngeal exudate.  Eyes: Conjunctivae are normal.  Neck: Normal range of motion. Neck supple.  Cardiovascular: Normal rate, regular rhythm, normal heart sounds and intact distal pulses.   Pulmonary/Chest: Effort normal and breath sounds normal. No respiratory distress. She has no wheezes. She exhibits no tenderness.  Abdominal: Soft. There is no tenderness.  Musculoskeletal: Normal range of motion. She exhibits edema (1+ LE edema at ankles, non-pitting. ) and tenderness (BLE).  Neurological: She is alert and oriented to person, place, and time.  Skin: Skin is warm and dry. She is not diaphoretic. No erythema.  No erythema or warmth to LE  Psychiatric: She has a normal mood and affect.    ED Course  Procedures (including critical care time) Medications  oxyCODONE-acetaminophen (PERCOCET/ROXICET) 5-325 MG per tablet 1 tablet (1 tablet Oral Given 06/28/13 1114)    Medications  oxyCODONE-acetaminophen (PERCOCET/ROXICET) 5-325 MG per tablet 1 tablet (1 tablet Oral Given 06/28/13 1114)    Labs Review Labs Reviewed  PRO B NATRIURETIC PEPTIDE - Abnormal; Notable for the following:    Pro B Natriuretic peptide (BNP) 218.5 (*)    All other components within normal limits  PROTIME-INR - Abnormal; Notable for the following:    Prothrombin Time 35.2 (*)    INR 3.69 (*)    All other components within normal limits  I-STAT CHEM 8, ED - Abnormal; Notable for the following:    Calcium, Ion 1.29 (*)    All other components within normal limits  URINALYSIS, ROUTINE W REFLEX MICROSCOPIC    Imaging Review Dg Chest 2  View  06/28/2013   CLINICAL DATA:  Chest pain, weakness, history stroke  EXAM: CHEST  2 VIEW  COMPARISON:  07/10/2009  FINDINGS: Enlargement of cardiac silhouette.  Mediastinal contours and pulmonary vascularity normal.  Minimal chronic bronchitic changes.  No acute infiltrate, pleural effusion or pneumothorax.  Prior thoracolumbar spinal fixation.  Osseous demineralization.  IMPRESSION: Enlargement of cardiac silhouette.  Minimal chronic bronchitic changes without acute infiltrate.   Electronically Signed   By: Lavonia Dana M.D.   On: 06/28/2013 11:45     EKG Interpretation None      Author: Dareen Piano Service: Vascular Lab Author Type: Cardiovascular Sonographer   Filed: 06/28/2013 12:16 PM Note Time: 06/28/2013 12:16 PM Status: Signed   Editor: Vilma Prader Slaughter (Cardiovascular Sonographer)      VASCULAR LAB  PRELIMINARY PRELIMINARY PRELIMINARY PRELIMINARY  Bilateral lower extremity venous duplex completed.  Preliminary report: Bilateral: No evidence of DVT, superficial thrombosis, or  Baker's Cyst.  Vilma Prader Slaughter, RVS  06/28/2013, 12:16 PM       MDM   Final diagnoses:  Edema of both legs    Filed Vitals:   06/28/13 1449  BP: 156/91  Pulse: 57  Temp:   Resp: 16   Afebrile, NAD, non-toxic appearing, AAOx4.   Neurovascularly intact. Normal sensation. 1+ BLE non-pitting edema noted. No warmth or erythema. No physical evidence to suggest cellulitis. Range of motion intact. Bilateral lower extremity venous duplex obtained no evidence of DVT, thrombosis or Baker's cyst. BNP mildly elevated. CXR with mild cardiac enlargement. Creatinine wnl. Patient complaining of urinary frequency, UA checked w/ no evidence of infection. Will start lasix for edema. Return precautions discussed. Patient is agreeable to plan. Patient is stable at time of discharge. Patient d/w with Dr. Darl Householder, agrees with plan.        Harlow Mares, PA-C 06/28/13 1505

## 2013-06-28 NOTE — ED Notes (Signed)
Pt walking to restroom to provide urine sample

## 2013-06-28 NOTE — Discharge Instructions (Signed)
Please follow up with your primary care physician in 1-2 days. If you do not have one please call the [facility] number listed above. Please take Lasix as prescribed. Please take at home pain medication as prescribed. Please read all discharge instructions and return precautions.   Edema Edema is an abnormal build-up of fluids in tissues. Because this is partly dependent on gravity (water flows to the lowest place), it is more common in the legs and thighs (lower extremities). It is also common in the looser tissues, like around the eyes. Painless swelling of the feet and ankles is common and increases as a person ages. It may affect both legs and may include the calves or even thighs. When squeezed, the fluid may move out of the affected area and may leave a dent for a few moments. CAUSES   Prolonged standing or sitting in one place for extended periods of time. Movement helps pump tissue fluid into the veins, and absence of movement prevents this, resulting in edema.  Varicose veins. The valves in the veins do not work as well as they should. This causes fluid to leak into the tissues.  Fluid and salt overload.  Injury, burn, or surgery to the leg, ankle, or foot, may damage veins and allow fluid to leak out.  Sunburn damages vessels. Leaky vessels allow fluid to go out into the sunburned tissues.  Allergies (from insect bites or stings, medications or chemicals) cause swelling by allowing vessels to become leaky.  Protein in the blood helps keep fluid in your vessels. Low protein, as in malnutrition, allows fluid to leak out.  Hormonal changes, including pregnancy and menstruation, cause fluid retention. This fluid may leak out of vessels and cause edema.  Medications that cause fluid retention. Examples are sex hormones, blood pressure medications, steroid treatment, or anti-depressants.  Some illnesses cause edema, especially heart failure, kidney disease, or liver  disease.  Surgery that cuts veins or lymph nodes, such as surgery done for the heart or for breast cancer, may result in edema. DIAGNOSIS  Your caregiver is usually easily able to determine what is causing your swelling (edema) by simply asking what is wrong (getting a history) and examining you (doing a physical). Sometimes x-rays, EKG (electrocardiogram or heart tracing), and blood work may be done to evaluate for underlying medical illness. TREATMENT  General treatment includes:  Leg elevation (or elevation of the affected body part).  Restriction of fluid intake.  Prevention of fluid overload.  Compression of the affected body part. Compression with elastic bandages or support stockings squeezes the tissues, preventing fluid from entering and forcing it back into the blood vessels.  Diuretics (also called water pills or fluid pills) pull fluid out of your body in the form of increased urination. These are effective in reducing the swelling, but can have side effects and must be used only under your caregiver's supervision. Diuretics are appropriate only for some types of edema. The specific treatment can be directed at any underlying causes discovered. Heart, liver, or kidney disease should be treated appropriately. HOME CARE INSTRUCTIONS   Elevate the legs (or affected body part) above the level of the heart, while lying down.  Avoid sitting or standing still for prolonged periods of time.  Avoid putting anything directly under the knees when lying down, and do not wear constricting clothing or garters on the upper legs.  Exercising the legs causes the fluid to work back into the veins and lymphatic channels.  This may help the swelling go down.  The pressure applied by elastic bandages or support stockings can help reduce ankle swelling.  A low-salt diet may help reduce fluid retention and decrease the ankle swelling.  Take any medications exactly as prescribed. SEEK MEDICAL  CARE IF:  Your edema is not responding to recommended treatments. SEEK IMMEDIATE MEDICAL CARE IF:   You develop shortness of breath or chest pain.  You cannot breathe when you lay down; or if, while lying down, you have to get up and go to the window to get your breath.  You are having increasing swelling without relief from treatment.  You develop a fever over 102 F (38.9 C).  You develop pain or redness in the areas that are swollen.  Tell your caregiver right away if you have gained 03 lb/1.4 kg in 1 day or 05 lb/2.3 kg in a week. MAKE SURE YOU:   Understand these instructions.  Will watch your condition.  Will get help right away if you are not doing well or get worse. Document Released: 02/18/2005 Document Revised: 08/20/2011 Document Reviewed: 10/07/2007 [facility] Patient Information 2014 [place].  Furosemide tablets What is this medicine? FUROSEMIDE (fyoor [place] se mide) is a diuretic. It helps you make more urine and to lose salt and excess water from your body. This medicine is used to treat high blood pressure, and edema or swelling from heart, kidney, or liver disease. This medicine may be used for other purposes; ask your health care provider or pharmacist if you have questions. COMMON BRAND NAME(S): Delone , Lasix What should I tell my health care provider before I take this medicine? They need to know if you have any of these conditions: -abnormal blood electrolytes -diarrhea or vomiting -gout -heart disease -kidney disease, small amounts of urine, or difficulty passing urine -liver disease -an unusual or allergic reaction to furosemide, sulfa drugs, other medicines, foods, dyes, or preservatives -pregnant or trying to get pregnant -breast-feeding How should I use this medicine? Take this medicine by mouth with a glass of water. Follow the directions on the prescription label. You may take this medicine with or without food. If it upsets your stomach,  take it with food or milk. Do not take your medicine more often than directed. Remember that you will need to pass more urine after taking this medicine. Do not take your medicine at a time of day that will cause you problems. Do not take at bedtime. Talk to your pediatrician regarding the use of this medicine in children. While this drug may be prescribed for selected conditions, precautions do apply. Overdosage: If you think you have taken too much of this medicine contact a poison control center or emergency room at once. NOTE: This medicine is only for you. Do not share this medicine with others. What if I miss a dose? If you miss a dose, take it as soon as you can. If it is almost time for your next dose, take only that dose. Do not take double or extra doses. What may interact with this medicine? -aspirin and aspirin-like medicines -certain antibiotics -chloral hydrate -cisplatin -cyclosporine -digoxin -diuretics -laxatives -lithium -medicines for blood pressure -medicines that relax muscles for surgery -methotrexate -NSAIDs, medicines for pain and inflammation like ibuprofen, naproxen, or indomethacin -phenytoin -steroid medicines like prednisone or cortisone -sucralfate This list may not describe all possible interactions. Give your health care provider a list of all the medicines, herbs, non-prescription drugs, or dietary supplements you use.  Also tell them if you smoke, drink alcohol, or use illegal drugs. Some items may interact with your medicine. What should I watch for while using this medicine? Visit your doctor or health care professional for regular checks on your progress. Check your blood pressure regularly. Ask your doctor or health care professional what your blood pressure should be, and when you should contact him or her. If you are a diabetic, check your blood sugar as directed. You may need to be on a special diet while taking this medicine. Check with your doctor.  Also, ask how many glasses of fluid you need to drink a day. You must not get dehydrated. You may get drowsy or dizzy. Do not drive, use machinery, or do anything that needs mental alertness until you know how this drug affects you. Do not stand or sit up quickly, especially if you are an older patient. This reduces the risk of dizzy or fainting spells. Alcohol can make you more drowsy and dizzy. Avoid alcoholic drinks. This medicine can make you more sensitive to the sun. Keep out of the sun. If you cannot avoid being in the sun, wear protective clothing and use sunscreen. Do not use sun lamps or tanning beds/booths. What side effects may I notice from receiving this medicine? Side effects that you should report to your doctor or health care professional as soon as possible: -blood in urine or stools -dry mouth -fever or chills -hearing loss or ringing in the ears -irregular heartbeat -muscle pain or weakness, cramps -skin rash -stomach upset, pain, or nausea -tingling or numbness in the hands or feet -unusually weak or tired -vomiting or diarrhea -yellowing of the eyes or skin Side effects that usually do not require medical attention (report to your doctor or health care professional if they continue or are bothersome): -headache -loss of appetite -unusual bleeding or bruising This list may not describe all possible side effects. Call your doctor for medical advice about side effects. You may report side effects to FDA at 1-800-FDA-1088. Where should I keep my medicine? Keep out of the reach of children. Store at room temperature between 15 and 30 degrees C (59 and 86 degrees F). Protect from light. Throw away any unused medicine after the expiration date. NOTE: This sheet is a summary. It may not cover all possible information. If you have questions about this medicine, talk to your doctor, pharmacist, or health care provider.  2014, [employer] Standard. (2009-02-06 16:24:50)

## 2013-06-28 NOTE — Progress Notes (Signed)
VASCULAR LAB PRELIMINARY  PRELIMINARY  PRELIMINARY  PRELIMINARY  Bilateral lower extremity venous duplex completed.    Preliminary report:  Bilateral:  No evidence of DVT, superficial thrombosis, or Baker's Cyst.   [place], RVS 06/28/2013, 12:16 PM

## 2013-06-28 NOTE — ED Notes (Signed)
Pt returned from radiology.

## 2013-06-28 NOTE — ED Notes (Signed)
PA at bedside.

## 2013-06-28 NOTE — ED Notes (Addendum)
Pt presents with BLE swelling and feeling sore x6 days, pt reports laying in her bed since Friday with her legs elevated. Pt seen at her PCP today and sent here to r/o a blood clot and to have a PT/INR collected

## 2013-06-29 NOTE — ED Provider Notes (Signed)
Medical screening examination/treatment/procedure(s) were performed by non-physician practitioner and as supervising physician I was immediately available for consultation/collaboration.   EKG Interpretation None        Wandra Arthurs, MD 06/29/13 [phone]

## 2013-07-07 ENCOUNTER — Telehealth: Payer: Self-pay

## 2013-07-07 NOTE — Telephone Encounter (Signed)
Patient called complaining of increased knee pain.  She says her knee hurts and is swollen.  She says tylenol is not touching the pain.  Please advise.

## 2013-07-08 NOTE — Telephone Encounter (Signed)
Advised patient she needs to follow up with Dr Rhona Raider.  She got very loud and was upset with response.  She is requesting an injection, advised her to contact Dr Rhona Raider.

## 2013-07-08 NOTE — Telephone Encounter (Signed)
Pt needs to follow up with Dr Rhona Raider for this

## 2013-07-12 DIAGNOSIS — R609 Edema, unspecified: Secondary | ICD-10-CM | POA: Diagnosis not present

## 2013-07-12 DIAGNOSIS — Z7901 Long term (current) use of anticoagulants: Secondary | ICD-10-CM | POA: Diagnosis not present

## 2013-07-12 DIAGNOSIS — L02419 Cutaneous abscess of limb, unspecified: Secondary | ICD-10-CM | POA: Diagnosis not present

## 2013-07-12 DIAGNOSIS — Z79899 Other long term (current) drug therapy: Secondary | ICD-10-CM | POA: Diagnosis not present

## 2013-07-12 DIAGNOSIS — M1710 Unilateral primary osteoarthritis, unspecified knee: Secondary | ICD-10-CM | POA: Diagnosis not present

## 2013-07-12 DIAGNOSIS — G608 Other hereditary and idiopathic neuropathies: Secondary | ICD-10-CM | POA: Diagnosis not present

## 2013-07-12 DIAGNOSIS — I8000 Phlebitis and thrombophlebitis of superficial vessels of unspecified lower extremity: Secondary | ICD-10-CM | POA: Diagnosis not present

## 2013-07-12 DIAGNOSIS — Z8673 Personal history of transient ischemic attack (TIA), and cerebral infarction without residual deficits: Secondary | ICD-10-CM | POA: Diagnosis not present

## 2013-07-13 DIAGNOSIS — M79609 Pain in unspecified limb: Secondary | ICD-10-CM | POA: Diagnosis not present

## 2013-07-13 DIAGNOSIS — M659 Synovitis and tenosynovitis, unspecified: Secondary | ICD-10-CM | POA: Diagnosis not present

## 2013-07-20 ENCOUNTER — Ambulatory Visit: Payer: Medicare Other | Admitting: Physical Medicine & Rehabilitation

## 2013-08-09 DIAGNOSIS — M1710 Unilateral primary osteoarthritis, unspecified knee: Secondary | ICD-10-CM | POA: Diagnosis not present

## 2013-08-16 DIAGNOSIS — M1710 Unilateral primary osteoarthritis, unspecified knee: Secondary | ICD-10-CM | POA: Diagnosis not present

## 2013-08-23 DIAGNOSIS — M1710 Unilateral primary osteoarthritis, unspecified knee: Secondary | ICD-10-CM | POA: Diagnosis not present

## 2013-08-25 DIAGNOSIS — H2640 Unspecified secondary cataract: Secondary | ICD-10-CM | POA: Diagnosis not present

## 2013-08-25 DIAGNOSIS — H4420 Degenerative myopia, unspecified eye: Secondary | ICD-10-CM | POA: Diagnosis not present

## 2013-08-25 DIAGNOSIS — H5440 Blindness, one eye, unspecified eye: Secondary | ICD-10-CM | POA: Diagnosis not present

## 2013-08-25 DIAGNOSIS — H43819 Vitreous degeneration, unspecified eye: Secondary | ICD-10-CM | POA: Diagnosis not present

## 2013-08-30 DIAGNOSIS — M1710 Unilateral primary osteoarthritis, unspecified knee: Secondary | ICD-10-CM | POA: Diagnosis not present

## 2013-09-07 DIAGNOSIS — I749 Embolism and thrombosis of unspecified artery: Secondary | ICD-10-CM | POA: Diagnosis not present

## 2013-09-07 DIAGNOSIS — Z7901 Long term (current) use of anticoagulants: Secondary | ICD-10-CM | POA: Diagnosis not present

## 2013-09-07 DIAGNOSIS — Z8673 Personal history of transient ischemic attack (TIA), and cerebral infarction without residual deficits: Secondary | ICD-10-CM | POA: Diagnosis not present

## 2013-09-10 ENCOUNTER — Other Ambulatory Visit: Payer: Self-pay | Admitting: Physical Medicine & Rehabilitation

## 2013-09-11 DIAGNOSIS — M1710 Unilateral primary osteoarthritis, unspecified knee: Secondary | ICD-10-CM | POA: Diagnosis not present

## 2013-09-15 ENCOUNTER — Telehealth: Payer: Self-pay

## 2013-09-15 NOTE — Telephone Encounter (Signed)
Patient called complaining of not being able to get medication refilled because she needs to be seen.  Appointment scheduled with NP so patient can get refill.

## 2013-09-20 DIAGNOSIS — M1710 Unilateral primary osteoarthritis, unspecified knee: Secondary | ICD-10-CM | POA: Diagnosis not present

## 2013-09-21 ENCOUNTER — Encounter: Payer: Self-pay | Admitting: Registered Nurse

## 2013-09-21 ENCOUNTER — Encounter: Payer: Medicare Other | Attending: Registered Nurse | Admitting: Registered Nurse

## 2013-09-21 VITALS — BP 129/80 | HR 76 | Resp 16 | Ht 66.0 in | Wt 199.0 lb

## 2013-09-21 DIAGNOSIS — M2142 Flat foot [pes planus] (acquired), left foot: Secondary | ICD-10-CM

## 2013-09-21 DIAGNOSIS — Z79899 Other long term (current) drug therapy: Secondary | ICD-10-CM | POA: Insufficient documentation

## 2013-09-21 DIAGNOSIS — M961 Postlaminectomy syndrome, not elsewhere classified: Secondary | ICD-10-CM | POA: Diagnosis not present

## 2013-09-21 DIAGNOSIS — M549 Dorsalgia, unspecified: Secondary | ICD-10-CM | POA: Insufficient documentation

## 2013-09-21 DIAGNOSIS — M25569 Pain in unspecified knee: Secondary | ICD-10-CM | POA: Insufficient documentation

## 2013-09-21 DIAGNOSIS — M47817 Spondylosis without myelopathy or radiculopathy, lumbosacral region: Secondary | ICD-10-CM | POA: Insufficient documentation

## 2013-09-21 DIAGNOSIS — Z8673 Personal history of transient ischemic attack (TIA), and cerebral infarction without residual deficits: Secondary | ICD-10-CM | POA: Insufficient documentation

## 2013-09-21 DIAGNOSIS — Z5181 Encounter for therapeutic drug level monitoring: Secondary | ICD-10-CM

## 2013-09-21 DIAGNOSIS — F411 Generalized anxiety disorder: Secondary | ICD-10-CM | POA: Insufficient documentation

## 2013-09-21 DIAGNOSIS — M2141 Flat foot [pes planus] (acquired), right foot: Secondary | ICD-10-CM

## 2013-09-21 DIAGNOSIS — M2140 Flat foot [pes planus] (acquired), unspecified foot: Secondary | ICD-10-CM | POA: Diagnosis not present

## 2013-09-21 MED ORDER — ACETAMINOPHEN-CODEINE #4 300-60 MG PO TABS: 1.0000 | ORAL_TABLET | Freq: Three times a day (TID) | ORAL | Status: DC | PRN

## 2013-09-21 NOTE — Progress Notes (Signed)
Subjective:    Patient ID: Emily Peck, female    DOB: May 03, 1955, 58 y.o.   MRN: [id number]  HPI: Emily Peck is a 58 year old female who returns for follow up for chronic pain and medication refill.She says her pain is located in her mid back and left knee. She rates her pain 4. Her current exercise regime is performing stretching exercises. She says she has frequent stumbling due to her being legally blind. She says she never falls and has been able to brace herself. Educated on [employer] and she verbalizes understanding. She works full time at [employer] for the [place] in [place] [employer] [employer].  Pain Inventory Average Pain 5 Pain Right Now 4 My pain is sharp and stabbing  In the last 24 hours, has pain interfered with the following? General activity 5 Relation with others 6 Enjoyment of life 10 What TIME of day is your pain at its worst? evening Sleep (in general) Fair  Pain is worse with: walking, bending and standing Pain improves with: rest and pacing activities Relief from Meds: 4  Mobility use a cane  Function I need assistance with the following:  household duties and shopping  Neuro/Psych numbness trouble walking  Prior Studies Any changes since last visit?  no  Physicians involved in your care Any changes since last visit?  no   Family History  Problem Relation Age of Onset  . Cancer Mother   . Diabetes Mother    History   Social History  . Marital Status: Single    Spouse Name: N/A    Number of Children: N/A  . Years of Education: N/A   Social History Main Topics  . Smoking status: Never Smoker   . Smokeless tobacco: Never Used  . Alcohol Use: No  . Drug Use: No  . Sexual Activity: None   Other Topics Concern  . None   Social History Narrative  . None   Past Surgical History  Procedure Laterality Date  . Back surgery    . Knee arthroscopy     Past Medical History  Diagnosis Date  . Stroke   . Anxiety   .  Postlaminectomy syndrome, thoracic region   . Postlaminectomy syndrome, lumbar region   . Lumbosacral spondylosis without myelopathy    BP 129/80  Pulse 76  Resp 16  Ht 5\' 6"  (1.676 m)  Wt 199 lb (90.266 kg)  BMI 32.13 kg/m2  SpO2 98%  Opioid Risk Score:   Fall Risk Score: Moderate Fall Risk (6-13 points) (patient educated handout declined)   Review of Systems  Musculoskeletal: Positive for back pain and gait problem.  Neurological: Positive for weakness.  All other systems reviewed and are negative.      Objective:   Physical Exam  Nursing note and vitals reviewed. Constitutional: She is oriented to person, place, and time. She appears well-developed and well-nourished.  HENT:  Head: Normocephalic and atraumatic.  Neck: Normal range of motion. Neck supple.  Cardiovascular: Normal rate and regular rhythm.   Pulmonary/Chest: Effort normal and breath sounds normal.  Musculoskeletal:  Normal Muscle Bulk and Muscle testing Reveals: Upper Extremities: Full ROM and Muscle Strength 5/5 Thoracic Paraspinal Tenderness: T-10- T-12 Lower Extremities: Full ROM and Muscle Strength 5/5 Left Knee Flexion Produces Pain into Left Patella Arises from chair with ease Narrow based gait. Uses a straight cane for support.  Neurological: She is alert and oriented to person, place, and time.  Skin: Skin is warm  and dry.  Psychiatric: She has a normal mood and affect.          Assessment & Plan:  1. 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 She is scheduled for Medial Branch Block T12-L-2 with Dtr. Kirsteins on 10/25/12. Refilled: Tylenol #4 one tablet every 8 hours as needed #90 2. Knee pain presumed osteoarthritis: F/U with Dr. Latanya Maudlin she says she is receiving injections and had one yesterday not sure what kind.  30 minutes of face to face patient care time was spent during this visit. All questions were encouraged and answered.  F/U in 1 month with Dr.  Letta Pate for MBB.

## 2013-09-27 DIAGNOSIS — M1710 Unilateral primary osteoarthritis, unspecified knee: Secondary | ICD-10-CM | POA: Diagnosis not present

## 2013-09-28 DIAGNOSIS — M659 Synovitis and tenosynovitis, unspecified: Secondary | ICD-10-CM | POA: Diagnosis not present

## 2013-09-28 DIAGNOSIS — M25579 Pain in unspecified ankle and joints of unspecified foot: Secondary | ICD-10-CM | POA: Diagnosis not present

## 2013-09-28 DIAGNOSIS — M79609 Pain in unspecified limb: Secondary | ICD-10-CM | POA: Diagnosis not present

## 2013-09-28 DIAGNOSIS — IMO0002 Reserved for concepts with insufficient information to code with codable children: Secondary | ICD-10-CM | POA: Diagnosis not present

## 2013-10-06 DIAGNOSIS — F315 Bipolar disorder, current episode depressed, severe, with psychotic features: Secondary | ICD-10-CM | POA: Diagnosis not present

## 2013-10-08 DIAGNOSIS — Z8673 Personal history of transient ischemic attack (TIA), and cerebral infarction without residual deficits: Secondary | ICD-10-CM | POA: Diagnosis not present

## 2013-10-08 DIAGNOSIS — Z9119 Patient's noncompliance with other medical treatment and regimen: Secondary | ICD-10-CM | POA: Diagnosis not present

## 2013-10-08 DIAGNOSIS — I749 Embolism and thrombosis of unspecified artery: Secondary | ICD-10-CM | POA: Diagnosis not present

## 2013-10-08 DIAGNOSIS — Z91199 Patient's noncompliance with other medical treatment and regimen due to unspecified reason: Secondary | ICD-10-CM | POA: Diagnosis not present

## 2013-10-08 DIAGNOSIS — R040 Epistaxis: Secondary | ICD-10-CM | POA: Diagnosis not present

## 2013-10-15 DIAGNOSIS — Z8673 Personal history of transient ischemic attack (TIA), and cerebral infarction without residual deficits: Secondary | ICD-10-CM | POA: Diagnosis not present

## 2013-10-15 DIAGNOSIS — Z7901 Long term (current) use of anticoagulants: Secondary | ICD-10-CM | POA: Diagnosis not present

## 2013-10-25 ENCOUNTER — Ambulatory Visit (HOSPITAL_BASED_OUTPATIENT_CLINIC_OR_DEPARTMENT_OTHER): Payer: Medicare Other | Admitting: Physical Medicine & Rehabilitation

## 2013-10-25 ENCOUNTER — Encounter: Payer: Self-pay | Admitting: Physical Medicine & Rehabilitation

## 2013-10-25 ENCOUNTER — Encounter: Payer: Medicare Other | Attending: Registered Nurse

## 2013-10-25 ENCOUNTER — Other Ambulatory Visit: Payer: Self-pay

## 2013-10-25 VITALS — BP 128/64 | HR 81 | Resp 14 | Ht 66.0 in | Wt 197.0 lb

## 2013-10-25 DIAGNOSIS — F411 Generalized anxiety disorder: Secondary | ICD-10-CM | POA: Insufficient documentation

## 2013-10-25 DIAGNOSIS — Z79899 Other long term (current) drug therapy: Secondary | ICD-10-CM | POA: Diagnosis not present

## 2013-10-25 DIAGNOSIS — M47817 Spondylosis without myelopathy or radiculopathy, lumbosacral region: Secondary | ICD-10-CM | POA: Diagnosis not present

## 2013-10-25 DIAGNOSIS — M961 Postlaminectomy syndrome, not elsewhere classified: Secondary | ICD-10-CM | POA: Diagnosis not present

## 2013-10-25 DIAGNOSIS — Z8673 Personal history of transient ischemic attack (TIA), and cerebral infarction without residual deficits: Secondary | ICD-10-CM | POA: Insufficient documentation

## 2013-10-25 DIAGNOSIS — M549 Dorsalgia, unspecified: Secondary | ICD-10-CM | POA: Diagnosis not present

## 2013-10-25 DIAGNOSIS — M25569 Pain in unspecified knee: Secondary | ICD-10-CM | POA: Insufficient documentation

## 2013-10-25 NOTE — Progress Notes (Signed)
   Subjective:    Patient ID: Emily Peck, female    DOB: 1955/10/02, 58 y.o.   MRN: [id number]  HPI INR 1.0 on 10/25/2013 Note from primary care physician   Review of Systems     Objective:   Physical Exam        Assessment & Plan:   Bilateral T 12, L1, L2 medial branch blocks under fluoroscopic guidance  Indication: Lumbar pain which is not relieved by medication management or other conservative care and interfering with self-care and mobility.  Informed consent was obtained after describing risks and benefits of the procedure with the patient, this includes bleeding, bruising, infection, paralysis and medication side effects. The patient wishes to proceed and has given written consent. The patient was placed in a prone position. The lumbar area was marked and prepped with Betadine. One ML of 1% lidocaine was injected into each of 6 areas into the skin and subcutaneous tissue. Then a 22-gauge 3.5 inch spinal needle was inserted targeting the junction of the left L3 superior articular process /transverse process junction. Needle was advanced under fluoroscopic guidance. Bone contact was made. Omnipaque 180 was injected x0.5 mL demonstrating no intravascular uptake. Then a solution containing one ML of 4 mg per mL dexamethasone and 3 mL of 2% MPF lidocaine was injected x0.5 mL. Then the left L2 superior articular process in transverse process junction was targeted. Bone contact was made. Omnipaque 180 was injected x0.5 mL demonstrating no intravascular uptake. Then a solution containing one ML of 4 mg per mL dexamethasone and 3 mL of 2% MPF lidocaine was injected x0.5 mL. Then the left L1 superior articular process in transverse process junction was targeted. Bone contact was made. Omnipaque 180 was injected x0.5 mL demonstrating no intravascular uptake. Then a solution containing one ML of 4 mg per mL dexamethasone and 3 mL of 2% MPF lidocaine was injected x0.5 mL. this same procedure  was performed on the right side using the same needle, technique, and injectate. Patient tolerated procedure well. Post procedure instructions were given. Please refer to post procedure form.

## 2013-10-25 NOTE — Patient Instructions (Signed)

## 2013-10-25 NOTE — Progress Notes (Signed)
  PROCEDURE RECORD [facility]   Name: Emily Peck DOB:02-10-1956 MRN: [id number]  Date:10/25/2013  Physician: Alysia Penna, MD    Nurse/CMA: Kiah Vanalstine CMA  Allergies: No Known Allergies  Consent Signed: Yes.    Is patient diabetic? No.  CBG today?   Pregnant: No. LMP: No LMP recorded. Patient has had a hysterectomy. (age [age])  Anticoagulants: yes (warfarin held for 7d, PT/INR 10/25/2013  INR 1.0) Anti-inflammatory: no Antibiotics: no  Procedure: Bilateral L12-s1-s2 Medical Branch Block Position: Prone Start Time: 138 End Time: 149  Fluoro Time: 43  RN/CMA Avedis Bevis CMA Dyani Babel CMA    Time 112 152    BP 128/64 121/63    Pulse 81 61    Respirations 14 14    O2 Sat 99 97    S/S 6 6    Pain Level 3/10 4/10     D/C home with SCAT, patient A & O X 3, D/C instructions reviewed, and sits independently.

## 2013-10-26 ENCOUNTER — Encounter: Payer: Self-pay | Admitting: Physical Medicine & Rehabilitation

## 2013-11-01 DIAGNOSIS — M25569 Pain in unspecified knee: Secondary | ICD-10-CM | POA: Diagnosis not present

## 2013-11-04 ENCOUNTER — Telehealth: Payer: Self-pay

## 2013-11-04 NOTE — Telephone Encounter (Signed)
Patient returned call. She is requesting a call back.

## 2013-11-04 NOTE — Telephone Encounter (Signed)
Attempted to contact patient. Left a voicemail to return call to clinic.  

## 2013-11-04 NOTE — Telephone Encounter (Signed)
Patient is requesting Hydrocodone for increased pain. Please advise.

## 2013-11-04 NOTE — Telephone Encounter (Signed)
Recommend increase of T#4 to QID, see how that works

## 2013-11-05 NOTE — Telephone Encounter (Signed)
Contacted patient to inform her per Dr. Letta Pate she can increase Tylenol #4 to QID. Patient will finish the medication she has now, and will need a refill to reflect QID. Patient will contact us when she is due for a refill.

## 2013-11-18 ENCOUNTER — Other Ambulatory Visit: Payer: Self-pay

## 2013-11-18 ENCOUNTER — Telehealth: Payer: Self-pay

## 2013-11-18 MED ORDER — ACETAMINOPHEN-CODEINE #4 300-60 MG PO TABS: 1.0000 | ORAL_TABLET | Freq: Four times a day (QID) | ORAL | Status: DC | PRN

## 2013-11-18 NOTE — Telephone Encounter (Signed)
Tylenol #4 refilled call in at pharmacy. Attempted to contact patient. Left a voicemail.

## 2013-11-18 NOTE — Telephone Encounter (Signed)
Patient is requesting a refill on Tylenol #4 to reflect the dose increase of QID.

## 2013-12-03 DIAGNOSIS — Z9181 History of falling: Secondary | ICD-10-CM | POA: Diagnosis not present

## 2013-12-03 DIAGNOSIS — I8291 Chronic embolism and thrombosis of unspecified vein: Secondary | ICD-10-CM | POA: Diagnosis not present

## 2013-12-03 DIAGNOSIS — Z8673 Personal history of transient ischemic attack (TIA), and cerebral infarction without residual deficits: Secondary | ICD-10-CM | POA: Diagnosis not present

## 2013-12-03 DIAGNOSIS — G468 Other vascular syndromes of brain in cerebrovascular diseases: Secondary | ICD-10-CM | POA: Diagnosis not present

## 2013-12-03 DIAGNOSIS — Z7901 Long term (current) use of anticoagulants: Secondary | ICD-10-CM | POA: Diagnosis not present

## 2013-12-03 DIAGNOSIS — Z23 Encounter for immunization: Secondary | ICD-10-CM | POA: Diagnosis not present

## 2013-12-07 ENCOUNTER — Ambulatory Visit: Payer: Medicare Other | Admitting: Physical Medicine & Rehabilitation

## 2013-12-10 DIAGNOSIS — Z7901 Long term (current) use of anticoagulants: Secondary | ICD-10-CM | POA: Diagnosis not present

## 2013-12-27 ENCOUNTER — Telehealth: Payer: Self-pay | Admitting: Hematology and Oncology

## 2013-12-27 NOTE — Telephone Encounter (Signed)
S/W PATIENT AND GAVE NP APPT FOR 11/10 @ 1:30 W/DR. Seth Ward.

## 2014-01-04 ENCOUNTER — Other Ambulatory Visit: Payer: Self-pay | Admitting: Physical Medicine & Rehabilitation

## 2014-01-04 ENCOUNTER — Encounter: Payer: Medicare Other | Attending: Registered Nurse

## 2014-01-04 ENCOUNTER — Encounter: Payer: Self-pay | Admitting: Physical Medicine & Rehabilitation

## 2014-01-04 ENCOUNTER — Ambulatory Visit (HOSPITAL_BASED_OUTPATIENT_CLINIC_OR_DEPARTMENT_OTHER): Payer: Medicare Other | Admitting: Physical Medicine & Rehabilitation

## 2014-01-04 VITALS — HR 86 | Resp 14 | Ht 65.0 in | Wt 200.4 lb

## 2014-01-04 DIAGNOSIS — M7062 Trochanteric bursitis, left hip: Secondary | ICD-10-CM

## 2014-01-04 DIAGNOSIS — Z9181 History of falling: Secondary | ICD-10-CM | POA: Diagnosis not present

## 2014-01-04 DIAGNOSIS — M7061 Trochanteric bursitis, right hip: Secondary | ICD-10-CM | POA: Insufficient documentation

## 2014-01-04 DIAGNOSIS — Z8673 Personal history of transient ischemic attack (TIA), and cerebral infarction without residual deficits: Secondary | ICD-10-CM | POA: Diagnosis not present

## 2014-01-04 DIAGNOSIS — M25551 Pain in right hip: Secondary | ICD-10-CM | POA: Diagnosis present

## 2014-01-04 DIAGNOSIS — M47817 Spondylosis without myelopathy or radiculopathy, lumbosacral region: Secondary | ICD-10-CM | POA: Diagnosis not present

## 2014-01-04 DIAGNOSIS — R791 Abnormal coagulation profile: Secondary | ICD-10-CM | POA: Diagnosis not present

## 2014-01-04 DIAGNOSIS — I8291 Chronic embolism and thrombosis of unspecified vein: Secondary | ICD-10-CM | POA: Diagnosis not present

## 2014-01-04 DIAGNOSIS — Z5181 Encounter for therapeutic drug level monitoring: Secondary | ICD-10-CM | POA: Diagnosis not present

## 2014-01-04 DIAGNOSIS — N390 Urinary tract infection, site not specified: Secondary | ICD-10-CM | POA: Diagnosis not present

## 2014-01-04 DIAGNOSIS — M47816 Spondylosis without myelopathy or radiculopathy, lumbar region: Secondary | ICD-10-CM | POA: Diagnosis not present

## 2014-01-04 DIAGNOSIS — M545 Low back pain: Secondary | ICD-10-CM | POA: Diagnosis not present

## 2014-01-04 DIAGNOSIS — Z79899 Other long term (current) drug therapy: Secondary | ICD-10-CM

## 2014-01-04 DIAGNOSIS — Z7901 Long term (current) use of anticoagulants: Secondary | ICD-10-CM | POA: Diagnosis not present

## 2014-01-04 DIAGNOSIS — G468 Other vascular syndromes of brain in cerebrovascular diseases: Secondary | ICD-10-CM | POA: Diagnosis not present

## 2014-01-04 NOTE — Progress Notes (Signed)
Subjective:    Patient ID: Emily Peck, female    DOB: 1955-04-01, 58 y.o.   MRN: [id number]  HPI  Good relief from bilateral T12 L1 L2 medial branch blocks performed on 10/25/2013 Patient now with 2 complaints. First she has right lateral hip pain. No recent falls. Has had this problem before which responded well to trochanteric bursa injection. Her other complaint is low back pain. This is also rated as moderate. No recent trauma. No pain radiating to the legs Pain Inventory Average Pain 4 Pain Right Now 4 My pain is constant, sharp and aching  In the last 24 hours, has pain interfered with the following? General activity 4 Relation with others 4 Enjoyment of life 4 What TIME of day is your pain at its worst? daytime, evening and night Sleep (in general) Fair  Pain is worse with: walking, bending, sitting, standing and some activites Pain improves with: rest Relief from Meds: 6  Mobility use a cane  Function retired  Neuro/Psych weakness trouble walking  Prior Studies Any changes since last visit?  no  Physicians involved in your care Any changes since last visit?  no   Family History  Problem Relation Age of Onset  . Cancer Mother   . Diabetes Mother    History   Social History  . Marital Status: Single    Spouse Name: N/A    Number of Children: N/A  . Years of Education: N/A   Social History Main Topics  . Smoking status: Never Smoker   . Smokeless tobacco: Never Used  . Alcohol Use: No  . Drug Use: No  . Sexual Activity: None   Other Topics Concern  . None   Social History Narrative   Past Surgical History  Procedure Laterality Date  . Back surgery    . Knee arthroscopy     Past Medical History  Diagnosis Date  . Stroke   . Anxiety   . Postlaminectomy syndrome, thoracic region   . Postlaminectomy syndrome, lumbar region   . Lumbosacral spondylosis without myelopathy    Pulse 86  Resp 14  Ht 5\' 5"  (1.651 m)  Wt 200 lb 6.4 oz  (90.901 kg)  BMI 33.35 kg/m2  SpO2 98%  Opioid Risk Score:   Fall Risk Score: Low Fall Risk (0-5 points)   Review of Systems     Objective:   Physical Exam  Constitutional: She is oriented to person, place, and time. She appears well-developed and well-nourished.  Musculoskeletal:       Lumbar back: She exhibits tenderness.  Tenderness to palpation over the right greater trochanter Tenderness palpation along the paraspinals at L5 and S1 bilaterally.   Neurological: She is alert and oriented to person, place, and time.  Psychiatric: She has a normal mood and affect.  Nursing note and vitals reviewed.         Assessment & Plan:  1. Right trochanteric bursitis pain only parts of response and medication management has responded well in the past to trochanteric bursa injection. We'll repeat today. INR 1.4  Minimal risk of increased bleeding  Trochanteric bursa injection With or without ultrasound guidance  Indication Trochanteric bursitis. Exam has tenderness over the greater trochanter of the Right  hip. Pain has not responded to conservative care such as exercise therapy and oral medications. Pain interferes with sleep or with mobility Informed consent was obtained after describing risks and benefits of the procedure with the patient these include bleeding bruising and infection.  Patient has signed written consent form. Patient placed in a lateral decubitus position with the affected hip superior. Point of maximal pain was palpated marked and prepped with Betadine and entered with a needle to bone contact. Needle slightly withdrawn then 6mg  of betamethasone with 4 cc 1% lidocaine were injected. Patient tolerated procedure well. Post procedure instructions given.   2. Lumbar spondylosis responded well to T12 L1 L2 medial branch blocks which correspond to L1-L2 and L2-L3Facet joints. Current pain is in the lower lumbar area. Would recommend L3 L4 L5 medial branch blocks. We'll  need to come off Coumadin for this as well.

## 2014-01-05 LAB — PMP ALCOHOL METABOLITE (ETG): Ethyl Glucuronide (EtG): NEGATIVE ng/mL

## 2014-01-07 LAB — OPIATES/OPIOIDS (LC/MS-MS)
Codeine Urine: 35755 ng/mL — AB (ref <50)
HYDROMORPHONE: NEGATIVE ng/mL (ref <50)
Hydrocodone: 73 ng/mL — AB (ref <50)
Morphine Urine: 1498 ng/mL — AB (ref <50)
NOROXYCODONE, UR: NEGATIVE ng/mL (ref <50)
Norhydrocodone, Ur: 76 ng/mL — AB (ref <50)
OXYCODONE, UR: NEGATIVE ng/mL (ref <50)
Oxymorphone: NEGATIVE ng/mL (ref <50)

## 2014-01-07 LAB — MEPERIDINE (GC/LC/MS), URINE
MEPERIDINE UR CONFIRM: NEGATIVE ng/mL (ref <100)
Normeperidine (GC/LC/MS), ur confirm: NEGATIVE ng/mL (ref <100)

## 2014-01-10 ENCOUNTER — Other Ambulatory Visit (HOSPITAL_COMMUNITY): Payer: Self-pay | Admitting: Internal Medicine

## 2014-01-10 ENCOUNTER — Other Ambulatory Visit: Payer: Self-pay | Admitting: Internal Medicine

## 2014-01-10 DIAGNOSIS — R921 Mammographic calcification found on diagnostic imaging of breast: Secondary | ICD-10-CM

## 2014-01-10 DIAGNOSIS — Z1231 Encounter for screening mammogram for malignant neoplasm of breast: Secondary | ICD-10-CM

## 2014-01-11 ENCOUNTER — Ambulatory Visit: Payer: Medicare Other

## 2014-01-11 ENCOUNTER — Encounter: Payer: Self-pay | Admitting: Hematology and Oncology

## 2014-01-11 ENCOUNTER — Ambulatory Visit (HOSPITAL_BASED_OUTPATIENT_CLINIC_OR_DEPARTMENT_OTHER): Payer: Medicare Other | Admitting: Hematology and Oncology

## 2014-01-11 VITALS — BP 117/65 | HR 59 | Temp 97.9°F | Resp 18 | Ht 65.0 in | Wt 199.7 lb

## 2014-01-11 DIAGNOSIS — G08 Intracranial and intraspinal phlebitis and thrombophlebitis: Secondary | ICD-10-CM

## 2014-01-11 NOTE — Assessment & Plan Note (Signed)
The patient had history of sagital sinus thrombosis causing severe headache. She has remained on anticoagulation with warfarin since then. She has no bleeding complications from warfarin. However, her INR fluctuated widely and at times at dangerous level, greater than 5. According to the patient, whenever she has her warfarin held for procedures, within 2-3 days, she would have recurrence of severe headache and felt that she may have recurrence of stroke if she stop warfarin permanently. We discussed some other treatment options including substituting it with antiplatelet agents or switching her to other newer anticoagulation treatment and the patient declined all other options, for fear that antiplatelet agents are not sufficient to prevent stroke and she is not pleased to hear all the other potential side effects with the newer anticoagulation therapy. At the end of the day today, I am not able to convince the patient to switch to other treatment or discontinue warfarin. The patient is happy to remain on warfarin for the rest of her life. I told the patient and warned her about risk of life-threatening bleeding with her poor vision and recurrent falls but the patient declined all other suggestions or options. I recommend her PCP to have more frequent warfarin check in the future. I have not made a return appointment for the patient to come back.

## 2014-01-11 NOTE — Progress Notes (Signed)
Checked in new pt with no financial concerns.  Pt has 2 insurances so financial assistance my not be required.

## 2014-01-11 NOTE — Progress Notes (Signed)
[facility] CONSULT NOTE  Patient Care Team: Glendale Chard, MD as PCP - General (Internal Medicine)  CHIEF COMPLAINTS/PURPOSE OF CONSULTATION:  Remote history of thrombosis, on chronic anticoagulation therapy  HISTORY OF PRESENTING ILLNESS:  Emily Peck 58 y.o. female is here because of chronic anticoagulation therapy with widely fluctuation of iron all levels. The patient could not remember the exact story of what happened 11 years ago. I reviewed her records extensively. Around January 2004, the patient complained of severe headache and neck pain with some loss of consciousness and amnesia. She had extensive evaluation and was hospitalized for stroke. The patient denies permanent neurological deficits at the time. MRI of the brain dated 03/31/2002 showed degenerative spine disease as well as abnormalities in the parietal lobe suspicious for stroke. Subsequent repeat MRI show abnormal enhancement suspicious for superficial cortical vein thrombosis. She was placed on warfarin therapy since then. The patient had multiple surgeries after that, including spine fusion surgery. 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 The patient had macular degeneration on the left eye with loss of peripheral lesion. She is legally blind. She has severe degenerative joint disease in both knees causing severe pain with the ability. The patient had recurrent falls at home without major injuries. I review some of her records related to warfarin monitoring. She has been getting an average monthly warfarin blood check and her INR level has fluctuated widely, with INR level as high as level of 6.5. The patient denies any recent signs or symptoms of bleeding such as spontaneous epistaxis, hematuria or hematochezia. The patient microwaves most of her diet and is inconsistent with her intake of food. MEDICAL HISTORY:  Past  Medical History  Diagnosis Date  . Stroke   . Anxiety   . Postlaminectomy syndrome, thoracic region   . Postlaminectomy syndrome, lumbar region   . Lumbosacral spondylosis without myelopathy     SURGICAL HISTORY: Past Surgical History  Procedure Laterality Date  . Back surgery    . Knee arthroscopy      SOCIAL HISTORY: History   Social History  . Marital Status: Single    Spouse Name: N/A    Number of Children: N/A  . Years of Education: N/A   Occupational History  . Not on file.   Social History Main Topics  . Smoking status: Never Smoker   . Smokeless tobacco: Never Used  . Alcohol Use: No  . Drug Use: No  . Sexual Activity: Not on file   Other Topics Concern  . Not on file   Social History Narrative    FAMILY HISTORY: Family History  Problem Relation Age of Onset  . Cancer Mother   . Diabetes Mother     ALLERGIES:  has No Known Allergies.  MEDICATIONS:  Current Outpatient Prescriptions  Medication Sig Dispense Refill  . acetaminophen-codeine (TYLENOL #4) 300-60 MG per tablet Take 1 tablet by mouth 4 (four) times daily as needed for moderate pain. 120 tablet 2  . atorvastatin (LIPITOR) 10 MG tablet Take 10 mg by mouth daily.     . Diclofenac Sodium 1.5 % SOLN Apply 1 each topically 3 (three) times daily as needed (for pain).     Marland Kitchen escitalopram (LEXAPRO) 20 MG tablet Take 30 mg by mouth daily.  0  . furosemide (LASIX) 20 MG tablet Take 1 tablet (20 mg total) by mouth daily. 14 tablet 0  .  gabapentin (NEURONTIN) 600 MG tablet Take 1 tablet by mouth Three times a day.    Marland Kitchen omeprazole (PRILOSEC) 20 MG capsule Take 20 mg by mouth daily.     Marland Kitchen tiZANidine (ZANAFLEX) 4 MG tablet Take 2 mg by mouth every 8 (eight) hours as needed for muscle spasms.    Marland Kitchen topiramate (TOPAMAX) 50 MG tablet Take 50-100 mg by mouth. Take 50mg  by mouth in AM and 100mg  by mouth in PM    . traMADol (ULTRAM) 50 MG tablet     . traZODone (DESYREL) 50 MG tablet Take 100 mg by mouth at  bedtime. For sleep.    Marland Kitchen warfarin (COUMADIN) 6 MG tablet Take 6-9 mg by mouth daily. Pt takes 6 mg on Tuesday, Thursday ,saturday, and sunday. Pt takes 9 mg on Monday, Wednesday and Friday.     No current facility-administered medications for this visit.    REVIEW OF SYSTEMS:   Constitutional: Denies fevers, chills or abnormal night sweats Eyes: Denies blurriness of vision, double vision or watery eyes. She has poor vision on her left eye Ears, nose, mouth, throat, and face: Denies mucositis or sore throat Respiratory: Denies cough, dyspnea or wheezes Cardiovascular: Denies palpitation, chest discomfort or lower extremity swelling Gastrointestinal:  Denies nausea, heartburn or change in bowel habits Skin: Denies abnormal skin rashes Lymphatics: Denies new lymphadenopathy or easy bruising Neurological:Denies numbness, tingling or new weaknesses Behavioral/Psych: Mood is stable, no new changes  All other systems were reviewed with the patient and are negative.  PHYSICAL EXAMINATION: ECOG PERFORMANCE STATUS: 2 - Symptomatic, <50% confined to bed  Filed Vitals:   01/11/14 1301  BP: 117/65  Pulse: 59  Temp: 97.9 F (36.6 C)  Resp: 18   Filed Weights   01/11/14 1301  Weight: 199 lb 11.2 oz (90.583 kg)    GENERAL:alert, no distress and comfortable SKIN: skin color, texture, turgor are normal, no rashes or significant lesions EYES: noted prosthesis on the right eye and mild abnormalities on the left eye. OROPHARYNX:no exudate, no erythema and lips, buccal mucosa, and tongue normal  NECK: supple, thyroid normal size, non-tender, without nodularity LYMPH:  no palpable lymphadenopathy in the cervical, axillary or inguinal LUNGS: clear to auscultation and percussion with normal breathing effort HEART: regular rate & rhythm and no murmurs and no lower extremity edema ABDOMEN:abdomen soft, non-tender and normal bowel sounds Musculoskeletal:no cyanosis of digits and no clubbing  PSYCH:  alert & oriented x 3 with fluent speech NEURO: no focal motor/sensory deficits. 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 She has remained on anticoagulation with warfarin since then. She has no bleeding complications from warfarin. However, her INR fluctuated widely and at times at dangerous level, greater than 5. According to the patient, whenever she has her warfarin held for procedures, within 2-3 days, she would have recurrence of severe headache and felt that she may have recurrence of stroke if she stop warfarin permanently. We discussed some other  treatment options including substituting it with antiplatelet agents or switching her to other newer anticoagulation treatment and the patient declined all other options, for fear that antiplatelet agents are not sufficient to prevent stroke and she is not pleased to hear all the other potential side effects with the newer anticoagulation therapy. At the end of the day today, I am not able to convince the patient to switch to other treatment or discontinue warfarin. The patient is happy to remain on warfarin for the rest of her life. I told the patient and warned her about risk of life-threatening bleeding with her poor vision and recurrent falls but the patient declined all other suggestions or options. I recommend her PCP to have more frequent warfarin check in the future. I have not made a return appointment for the patient to come back.      All questions were answered. The patient  knows to call the clinic with any problems, questions or concerns. I spent 40 minutes counseling the patient face to face. The total time spent in the appointment was 55 minutes and more than 50% was on counseling.     [place], [place], MD 01/11/2014 1:42 PM

## 2014-01-12 LAB — PRESCRIPTION MONITORING PROFILE (SOLSTAS)
Amphetamine/Meth: NEGATIVE ng/mL
BARBITURATE SCREEN, URINE: NEGATIVE ng/mL
Benzodiazepine Screen, Urine: NEGATIVE ng/mL
Buprenorphine, Urine: NEGATIVE ng/mL
CANNABINOID SCRN UR: NEGATIVE ng/mL
CARISOPRODOL, URINE: NEGATIVE ng/mL
CREATININE, URINE: 68.74 mg/dL (ref >20.0)
Cocaine Metabolites: NEGATIVE ng/mL
Fentanyl, Ur: NEGATIVE ng/mL
MDMA URINE: NEGATIVE ng/mL
Methadone Screen, Urine: NEGATIVE ng/mL
Nitrites, Initial: NEGATIVE ug/mL
OXYCODONE SCRN UR: NEGATIVE ng/mL
Propoxyphene: NEGATIVE ng/mL
TAPENTADOLUR: NEGATIVE ng/mL
Tramadol Scrn, Ur: NEGATIVE ng/mL
Zolpidem, Urine: NEGATIVE ng/mL
pH, Initial: 6.7 pH (ref 4.5–8.9)

## 2014-02-01 ENCOUNTER — Ambulatory Visit
Admission: RE | Admit: 2014-02-01 | Discharge: 2014-02-01 | Disposition: A | Discharge status: Home / Self Care | Payer: Medicare Other | Source: Ambulatory Visit | Attending: Internal Medicine | Admitting: Internal Medicine

## 2014-02-01 DIAGNOSIS — Z79899 Other long term (current) drug therapy: Secondary | ICD-10-CM | POA: Diagnosis not present

## 2014-02-01 DIAGNOSIS — R921 Mammographic calcification found on diagnostic imaging of breast: Secondary | ICD-10-CM | POA: Diagnosis not present

## 2014-02-01 DIAGNOSIS — F315 Bipolar disorder, current episode depressed, severe, with psychotic features: Secondary | ICD-10-CM | POA: Diagnosis not present

## 2014-02-01 DIAGNOSIS — Z8673 Personal history of transient ischemic attack (TIA), and cerebral infarction without residual deficits: Secondary | ICD-10-CM | POA: Diagnosis not present

## 2014-02-01 DIAGNOSIS — Z7901 Long term (current) use of anticoagulants: Secondary | ICD-10-CM | POA: Diagnosis not present

## 2014-02-07 ENCOUNTER — Emergency Department (HOSPITAL_COMMUNITY): Payer: Medicare Other

## 2014-02-07 ENCOUNTER — Encounter (HOSPITAL_COMMUNITY): Payer: Self-pay | Admitting: Emergency Medicine

## 2014-02-07 ENCOUNTER — Emergency Department (HOSPITAL_COMMUNITY)
Admission: EM | Admit: 2014-02-07 | Discharge: 2014-02-07 | Disposition: A | Discharge status: Home / Self Care | Payer: Medicare Other | Attending: Emergency Medicine | Admitting: Emergency Medicine

## 2014-02-07 DIAGNOSIS — S4991XA Unspecified injury of right shoulder and upper arm, initial encounter: Secondary | ICD-10-CM | POA: Diagnosis not present

## 2014-02-07 DIAGNOSIS — F419 Anxiety disorder, unspecified: Secondary | ICD-10-CM | POA: Diagnosis not present

## 2014-02-07 DIAGNOSIS — Z9889 Other specified postprocedural states: Secondary | ICD-10-CM | POA: Diagnosis not present

## 2014-02-07 DIAGNOSIS — M25551 Pain in right hip: Secondary | ICD-10-CM | POA: Diagnosis not present

## 2014-02-07 DIAGNOSIS — Z0389 Encounter for observation for other suspected diseases and conditions ruled out: Secondary | ICD-10-CM | POA: Diagnosis not present

## 2014-02-07 DIAGNOSIS — Y9301 Activity, walking, marching and hiking: Secondary | ICD-10-CM | POA: Diagnosis not present

## 2014-02-07 DIAGNOSIS — Z7902 Long term (current) use of antithrombotics/antiplatelets: Secondary | ICD-10-CM | POA: Diagnosis not present

## 2014-02-07 DIAGNOSIS — Z79899 Other long term (current) drug therapy: Secondary | ICD-10-CM | POA: Diagnosis not present

## 2014-02-07 DIAGNOSIS — M25511 Pain in right shoulder: Secondary | ICD-10-CM | POA: Diagnosis not present

## 2014-02-07 DIAGNOSIS — S3992XA Unspecified injury of lower back, initial encounter: Secondary | ICD-10-CM | POA: Diagnosis not present

## 2014-02-07 DIAGNOSIS — R51 Headache: Secondary | ICD-10-CM | POA: Diagnosis not present

## 2014-02-07 DIAGNOSIS — Y9289 Other specified places as the place of occurrence of the external cause: Secondary | ICD-10-CM | POA: Insufficient documentation

## 2014-02-07 DIAGNOSIS — S299XXA Unspecified injury of thorax, initial encounter: Secondary | ICD-10-CM | POA: Diagnosis not present

## 2014-02-07 DIAGNOSIS — S199XXA Unspecified injury of neck, initial encounter: Secondary | ICD-10-CM | POA: Diagnosis not present

## 2014-02-07 DIAGNOSIS — M545 Low back pain, unspecified: Secondary | ICD-10-CM

## 2014-02-07 DIAGNOSIS — S79911A Unspecified injury of right hip, initial encounter: Secondary | ICD-10-CM | POA: Insufficient documentation

## 2014-02-07 DIAGNOSIS — W108XXA Fall (on) (from) other stairs and steps, initial encounter: Secondary | ICD-10-CM | POA: Insufficient documentation

## 2014-02-07 DIAGNOSIS — M549 Dorsalgia, unspecified: Secondary | ICD-10-CM | POA: Diagnosis not present

## 2014-02-07 DIAGNOSIS — Y998 Other external cause status: Secondary | ICD-10-CM | POA: Insufficient documentation

## 2014-02-07 DIAGNOSIS — Z8673 Personal history of transient ischemic attack (TIA), and cerebral infarction without residual deficits: Secondary | ICD-10-CM | POA: Insufficient documentation

## 2014-02-07 DIAGNOSIS — S8991XA Unspecified injury of right lower leg, initial encounter: Secondary | ICD-10-CM | POA: Diagnosis not present

## 2014-02-07 DIAGNOSIS — W19XXXA Unspecified fall, initial encounter: Secondary | ICD-10-CM

## 2014-02-07 DIAGNOSIS — T148 Other injury of unspecified body region: Secondary | ICD-10-CM | POA: Diagnosis not present

## 2014-02-07 DIAGNOSIS — Z8739 Personal history of other diseases of the musculoskeletal system and connective tissue: Secondary | ICD-10-CM | POA: Insufficient documentation

## 2014-02-07 DIAGNOSIS — S0990XA Unspecified injury of head, initial encounter: Secondary | ICD-10-CM | POA: Diagnosis not present

## 2014-02-07 DIAGNOSIS — R413 Other amnesia: Secondary | ICD-10-CM | POA: Insufficient documentation

## 2014-02-07 LAB — CBC
HCT: 35 % — ABNORMAL LOW (ref 36.0–46.0)
Hemoglobin: 11.4 g/dL — ABNORMAL LOW (ref 12.0–15.0)
MCH: 31.3 pg (ref 26.0–34.0)
MCHC: 32.6 g/dL (ref 30.0–36.0)
MCV: 96.2 fL (ref 78.0–100.0)
PLATELETS: 236 10*3/uL (ref 150–400)
RBC: 3.64 MIL/uL — ABNORMAL LOW (ref 3.87–5.11)
RDW: 13.2 % (ref 11.5–15.5)
WBC: 7.7 10*3/uL (ref 4.0–10.5)

## 2014-02-07 LAB — BASIC METABOLIC PANEL
ANION GAP: 12 (ref 5–15)
BUN: 15 mg/dL (ref 6–23)
CALCIUM: 9.1 mg/dL (ref 8.4–10.5)
CO2: 22 mEq/L (ref 19–32)
Chloride: 106 mEq/L (ref 96–112)
Creatinine, Ser: 0.55 mg/dL (ref 0.50–1.10)
GLUCOSE: 82 mg/dL (ref 70–99)
Potassium: 4.2 mEq/L (ref 3.7–5.3)
SODIUM: 140 meq/L (ref 137–147)

## 2014-02-07 LAB — PROTIME-INR
INR: 2.6 — ABNORMAL HIGH (ref 0.00–1.49)
Prothrombin Time: 28 seconds — ABNORMAL HIGH (ref 11.6–15.2)

## 2014-02-07 LAB — I-STAT TROPONIN, ED: TROPONIN I, POC: 0 ng/mL (ref 0.00–0.08)

## 2014-02-07 MED ORDER — CYCLOBENZAPRINE HCL 10 MG PO TABS: 10.0000 mg | ORAL_TABLET | Freq: Three times a day (TID) | ORAL | Status: DC | PRN

## 2014-02-07 MED ORDER — CYCLOBENZAPRINE HCL 10 MG PO TABS
10.0000 mg | ORAL_TABLET | Freq: Once | ORAL | Status: AC
  Administered 2014-02-07: 10 mg via ORAL
  Filled 2014-02-07: qty 1

## 2014-02-07 MED ORDER — OXYCODONE-ACETAMINOPHEN 5-325 MG PO TABS
2.0000 | ORAL_TABLET | Freq: Once | ORAL | Status: AC
  Administered 2014-02-07: 2 via ORAL
  Filled 2014-02-07: qty 2

## 2014-02-07 MED ORDER — OXYCODONE-ACETAMINOPHEN 5-325 MG PO TABS: 1.0000 | ORAL_TABLET | Freq: Four times a day (QID) | ORAL | Status: DC | PRN

## 2014-02-07 MED ORDER — MORPHINE SULFATE 4 MG/ML IJ SOLN
4.0000 mg | Freq: Once | INTRAMUSCULAR | Status: AC
  Administered 2014-02-07: 4 mg via INTRAVENOUS
  Filled 2014-02-07: qty 1

## 2014-02-07 NOTE — ED Provider Notes (Signed)
CSN: [id number]     Arrival date & time 02/07/14  1803 History   First MD Initiated Contact with Patient 02/07/14 1807     Chief Complaint  Patient presents with  . Fall     (Consider location/radiation/quality/duration/timing/severity/associated sxs/prior Treatment) HPI Comments: [place] over a railing about 4 feet. No preceding symptoms per patient, however she doesn't remember what was going on and why she fell.   Patient is a 58 y.o. female presenting with fall. The history is provided by the patient.  Fall This is a new problem. The current episode started less than 1 hour ago. Episode frequency: once. The problem has not changed since onset.Pertinent negatives include no chest pain and no shortness of breath. Nothing aggravates the symptoms. Nothing relieves the symptoms.    Past Medical History  Diagnosis Date  . Stroke   . Anxiety   . Postlaminectomy syndrome, thoracic region   . Postlaminectomy syndrome, lumbar region   . Lumbosacral spondylosis without myelopathy    Past Surgical History  Procedure Laterality Date  . Back surgery    . Knee arthroscopy     Family History  Problem Relation Age of Onset  . Cancer Mother   . Diabetes Mother    History  Substance Use Topics  . Smoking status: Never Smoker   . Smokeless tobacco: Never Used  . Alcohol Use: No   OB History    No data available     Review of Systems  Constitutional: Negative for fever.  Respiratory: Negative for cough and shortness of breath.   Cardiovascular: Negative for chest pain and leg swelling.  Gastrointestinal: Negative for vomiting.  All other systems reviewed and are negative.     Allergies  Review of patient's allergies indicates no known allergies.  Home Medications   Prior to Admission medications   Medication Sig Start Date End Date Taking? Authorizing Provider  acetaminophen-codeine (TYLENOL #4) 300-60 MG per tablet Take 1 tablet by mouth 4 (four) times daily as needed for  moderate pain. 11/18/13   Charlett Blake, MD  atorvastatin (LIPITOR) 10 MG tablet Take 10 mg by mouth daily.  04/29/13   Historical Provider, MD  Diclofenac Sodium 1.5 % SOLN Apply 1 each topically 3 (three) times daily as needed (for pain).  05/10/13   Historical Provider, MD  escitalopram (LEXAPRO) 20 MG tablet Take 30 mg by mouth daily. 12/13/13   Historical Provider, MD  furosemide (LASIX) 20 MG tablet Take 1 tablet (20 mg total) by mouth daily. 06/28/13   Jennifer L Piepenbrink, PA-C  gabapentin (NEURONTIN) 600 MG tablet Take 1 tablet by mouth Three times a day. 11/27/11   Historical Provider, MD  omeprazole (PRILOSEC) 20 MG capsule Take 20 mg by mouth daily.  06/04/13   Historical Provider, MD  tiZANidine (ZANAFLEX) 4 MG tablet Take 2 mg by mouth every 8 (eight) hours as needed for muscle spasms.    Historical Provider, MD  topiramate (TOPAMAX) 50 MG tablet Take 50-100 mg by mouth. Take 50mg  by mouth in AM and 100mg  by mouth in PM 02/24/12   Historical Provider, MD  traMADol Veatrice Bourbon) 50 MG tablet  09/07/13   Historical Provider, MD  traZODone (DESYREL) 50 MG tablet Take 100 mg by mouth at bedtime. For sleep.    Historical Provider, MD  warfarin (COUMADIN) 6 MG tablet Take 6-9 mg by mouth daily. Pt takes 6 mg on Tuesday, Thursday ,saturday, and sunday. Pt takes 9 mg on Monday, Wednesday and Friday.  Historical Provider, MD   BP 157/94 mmHg  Pulse 61  Temp(Src) 97.5 F (36.4 C) (Oral)  Resp 15  Ht 5\' 6"  (1.676 m)  Wt 195 lb (88.451 kg)  BMI 31.49 kg/m2  SpO2 100% Physical Exam  Constitutional: She is oriented to person, place, and time. She appears well-developed and well-nourished. No distress.  HENT:  Head: Normocephalic and atraumatic.  Mouth/Throat: Oropharynx is clear and moist.  Eyes: EOM are normal. Pupils are equal, round, and reactive to light.  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 Neck supple.  Cardiovascular: Normal rate and regular rhythm.  Exam reveals no  friction rub.   No murmur heard. Pulmonary/Chest: Effort normal and breath sounds normal. No respiratory distress. She has no wheezes. She has no rales.  Abdominal: Soft. She exhibits no distension. There is no tenderness. There is no rebound.  Musculoskeletal: Normal range of motion. She exhibits no edema.  Neurological: She is alert and oriented to person, place, and time.  Skin: She is not diaphoretic.  Nursing note and vitals reviewed.   ED Course  Procedures (including critical care time) Labs Review Labs Reviewed - No data to display  Imaging Review Dg Chest 1 View  02/07/2014   CLINICAL DATA:  Fall, back, shoulder and hip pain. Fell while walking up stairs, fell over railing for feet. Patient is blind in RIGHT eye. On blood thinners.  EXAM: CHEST - 1 VIEW  COMPARISON:  Chest radiograph June 28, 2013  FINDINGS: The cardiac silhouette is upper limits of normal, mediastinal silhouette is nonsuspicious. Mild bronchitic changes without pleural effusions or focal consolidation. No pneumothorax. Thoracolumbar instrumentation incompletely imaged. Soft tissue planes and included osseous structures are nonsuspicious.  IMPRESSION: Borderline cardiomegaly and mild bronchitic changes without focal consolidation.   Electronically Signed   By: Elon Alas   On: 02/07/2014 21:06   Dg Hip Complete Right  02/07/2014   CLINICAL DATA:  Fall, back, shoulder and hip pain. Fell while walking up stairs, fell over railing for feet. Patient is blind in RIGHT eye. On blood thinners.  EXAM: RIGHT HIP - COMPLETE 2+ VIEW  COMPARISON:  None.  FINDINGS: There is no evidence of hip fracture or dislocation. Mild enthesopathy at greater trochanter. Mild pubic symphyseal osteoarthrosis. There is no evidence of arthropathy or other focal bone abnormality. Phleboliths in the pelvis. Patient is on a trauma board.  IMPRESSION: No acute osseous process.   Electronically Signed   By: Elon Alas   On: 02/07/2014 21:05    Dg Femur Right  02/07/2014   CLINICAL DATA:  Fall, back, shoulder and hip pain. Fell while walking up stairs, fell over railing for feet. Patient is blind in RIGHT eye. On blood thinners.  EXAM: RIGHT FEMUR - 2 VIEW  COMPARISON:  RIGHT tibia and fibula radiographs September 17, 2006  FINDINGS: There is no evidence of fracture or other focal bone lesions. Soft tissues are unremarkable.  Included view of the knee demonstrates at least moderate tricompartmental osteoarthrosis and, apparent bipartite patella. Patient is on a trauma board. Phleboliths in the pelvis.  IMPRESSION: No acute fracture deformity or dislocation.  Apparent bipartite patella, recommend correlation with point tenderness.   Electronically Signed   By: Elon Alas   On: 02/07/2014 21:08   Ct Head Wo Contrast  02/07/2014   CLINICAL DATA:  Pt is from home was walking up the stairs pt fell over the railing about 29ft. Complains of rt back pain &  rt shoulder pain. Pt has rales in spine. Pt denies any LOC, headache, n/v .  EXAM: CT HEAD WITHOUT CONTRAST  CT CERVICAL SPINE WITHOUT CONTRAST  TECHNIQUE: Multidetector CT imaging of the head and cervical spine was performed following the standard protocol without intravenous contrast. Multiplanar CT image reconstructions of the cervical spine were also generated.  COMPARISON:  Head CT, 09/22/2006  FINDINGS: CT HEAD FINDINGS  Ventricles are normal in size and configuration. No parenchymal masses or mass effect. No evidence of an infarct. There are no extra-axial masses or abnormal fluid collections.  No intracranial hemorrhage.  Stable right prosthetic globe. Elongated left globe, also unchanged.  Visualized sinuses and mastoid air cells are clear. No skull fracture.  CT CERVICAL SPINE FINDINGS  No fracture. No spondylolisthesis. Disc spaces are well preserved. No significant stenosis. He year soft tissues are unremarkable. Lung apices are clear new  IMPRESSION: HEAD CT:  No acute intracranial  abnormalities.  No skull fracture.  CERVICAL CT:  No fracture or acute finding.   Electronically Signed   By: Lajean Manes M.D.   On: 02/07/2014 21:28   Ct Cervical Spine Wo Contrast  02/07/2014   CLINICAL DATA:  Pt is from home was walking up the stairs pt fell over the railing about 59ft. Complains of rt back pain & rt shoulder pain. Pt has rales in spine. Pt denies any LOC, headache, n/v .  EXAM: CT HEAD WITHOUT CONTRAST  CT CERVICAL SPINE WITHOUT CONTRAST  TECHNIQUE: Multidetector CT imaging of the head and cervical spine was performed following the standard protocol without intravenous contrast. Multiplanar CT image reconstructions of the cervical spine were also generated.  COMPARISON:  Head CT, 09/22/2006  FINDINGS: CT HEAD FINDINGS  Ventricles are normal in size and configuration. No parenchymal masses or mass effect. No evidence of an infarct. There are no extra-axial masses or abnormal fluid collections.  No intracranial hemorrhage.  Stable right prosthetic globe. Elongated left globe, also unchanged.  Visualized sinuses and mastoid air cells are clear. No skull fracture.  CT CERVICAL SPINE FINDINGS  No fracture. No spondylolisthesis. Disc spaces are well preserved. No significant stenosis. He year soft tissues are unremarkable. Lung apices are clear new  IMPRESSION: HEAD CT:  No acute intracranial abnormalities.  No skull fracture.  CERVICAL CT:  No fracture or acute finding.   Electronically Signed   By: Lajean Manes M.D.   On: 02/07/2014 21:28   Ct Lumbar Spine Wo Contrast  02/07/2014   CLINICAL DATA:  Pt is from home was walking up the stairs pt fell over the railing about 21ft. Complains of rt back pain & rt shoulder pain. Pt has rales in spine. Pt denies any LOC, headache, n/v .  EXAM: CT LUMBAR SPINE WITHOUT CONTRAST  TECHNIQUE: Multidetector CT imaging of the lumbar spine was performed without intravenous contrast administration. Multiplanar CT image reconstructions were also generated.   COMPARISON:  Lumbar spine radiographs, 11/14/2012.  CT, 03/14/2010.  FINDINGS: Old fractures of T11 and T10, incompletely imaged on this exam, have been previously stabilized with pedicle screws and fusion rods spanning from the mid to lower thoracic spine to the T12-L1 level. The visualize orthopedic hardware is well-seated with no evidence of loosening.  No acute fracture. No spondylolisthesis. Lumbar disc spaces are well preserved.  T12-L1:  Unremarkable.  L1-L2: Unremarkable.  L2-L3: Unremarkable.  L3-L4: Mild right facet degenerative change. No significant disc bulging or disc protrusion. No significant stenosis.  L4-L5: Mild facet degenerative change. Mild  right neural foraminal narrowing.  L5-S1:  Unremarkable.  Surrounding soft tissues are unremarkable.  IMPRESSION: 1. No acute findings. Specifically, no acute fracture or spondylolisthesis. No disc herniation. 2. Stable changes from previous posterior thoracolumbar fusion. 3. Mild degenerative changes.   Electronically Signed   By: Lajean Manes M.D.   On: 02/07/2014 21:34   Dg Humerus Right  02/07/2014   CLINICAL DATA:  Fall, back, shoulder and hip pain. Fell while walking up stairs, fell over railing for feet. Patient is blind in RIGHT eye. On blood thinners.  EXAM: RIGHT HUMERUS - 2+ VIEW  COMPARISON:  None.  FINDINGS: There is no evidence of fracture or other focal bone lesions. Soft tissues are unremarkable.  IMPRESSION: Negative.   Electronically Signed   By: Elon Alas   On: 02/07/2014 21:10     EKG Interpretation   Date/Time:  Monday February 07 2014 18:10:12 EST Ventricular Rate:  63 PR Interval:  148 QRS Duration: 81 QT Interval:  454 QTC Calculation: 465 R Axis:   23 Text Interpretation:  Sinus rhythm Similar to prior Confirmed by Mingo Amber   MD, [place] ([id number]) on 02/07/2014 7:51:45 PM      MDM   Final diagnoses:  Fall  Midline low back pain without sciatica    6F presents after a fall. Was at home, doesn't remember  circumstances of fall. Fell about 4 feet. Complaining of neck pain. R arm pain, R hip pain, lower back pain. Denies preceding CP, SOB or those at this time. Will image Head, c-spine, chest, L-spine, R hip and femur. NVI in all extremities. All imaging ok. Labs ok. Ambulating well. Stable for discharge.    Evelina Bucy, MD 02/08/14 [phone]

## 2014-02-07 NOTE — ED Notes (Addendum)
Pt is from home was walking up the stairs pt fell over the railing about 61ft. Complains of rt back pain & rt shoulder pain. Pt has rales in spine. Pt denies any LOC, headache, n/v . Pt is blind in the rt eye. Pt placed on back board and towels for blocks with EMS. Vitals: CBG 91 BP 142/90 pulse 88. Pt pain is 9/10 when moving and 5/10 when laying still. Pt is placed on blood thinners.

## 2014-02-07 NOTE — ED Notes (Signed)
C-collar placed by Gillis Santa., RN

## 2014-02-07 NOTE — ED Notes (Signed)
Patient transported to X-ray 

## 2014-02-07 NOTE — Discharge Instructions (Signed)
Musculoskeletal Pain Musculoskeletal pain is muscle and boney aches and pains. These pains can occur in any part of the body. Your caregiver may treat you without knowing the cause of the pain. They may treat you if blood or urine tests, X-rays, and other tests were normal.  CAUSES There is often not a definite cause or reason for these pains. These pains may be caused by a type of germ (virus). The discomfort may also come from overuse. Overuse includes working out too hard when your body is not fit. Boney aches also come from weather changes. Bone is sensitive to atmospheric pressure changes. HOME CARE INSTRUCTIONS   Ask when your test results will be ready. Make sure you get your test results.  Only take over-the-counter or prescription medicines for pain, discomfort, or fever as directed by your caregiver. If you were given medications for your condition, do not drive, operate machinery or power tools, or sign legal documents for 24 hours. Do not drink alcohol. Do not take sleeping pills or other medications that may interfere with treatment.  Continue all activities unless the activities cause more pain. When the pain lessens, slowly resume normal activities. Gradually increase the intensity and duration of the activities or exercise.  During periods of severe pain, bed rest may be helpful. Lay or sit in any position that is comfortable.  Putting ice on the injured area.  Put ice in a bag.  Place a towel between your skin and the bag.  Leave the ice on for 15 to 20 minutes, 3 to 4 times a day.  Follow up with your caregiver for continued problems and no reason can be found for the pain. If the pain becomes worse or does not go away, it may be necessary to repeat tests or do additional testing. Your caregiver may need to look further for a possible cause. SEEK IMMEDIATE MEDICAL CARE IF:  You have pain that is getting worse and is not relieved by medications.  You develop chest pain  that is associated with shortness or breath, sweating, feeling sick to your stomach (nauseous), or throw up (vomit).  Your pain becomes localized to the abdomen.  You develop any new symptoms that seem different or that concern you. MAKE SURE YOU:   Understand these instructions.  Will watch your condition.  Will get help right away if you are not doing well or get worse. Document Released: 02/18/2005 Document Revised: 05/13/2011 Document Reviewed: 10/23/2012 [facility] Patient Information 2015 [place], [place]. This information is not intended to replace advice given to you by your health care provider. Make sure you discuss any questions you have with your health care provider.  Fall Prevention in Hospitals As a hospital patient, your condition and the treatments you receive can increase your risk for falls. Some additional risk factors for falls in a hospital include:  Being in an unfamiliar environment.  Being on bed rest.  Your surgery.  Taking certain medicines.  Your tubing requirements, such as intravenous (IV) therapy or catheters. It is important that you learn how to decrease fall risks while at the hospital. Below are important tips that can help prevent falls. SAFETY TIPS FOR PREVENTING FALLS Talk about your risk of falling.  Ask your caregiver why you are at risk for falling. Is it your medicine, illness, tubing placement, or something else?  Make a plan with your caregiver to keep you safe from falls.  Ask your caregiver or pharmacist about side effect of your medicines. Some  medicines can make you dizzy or affect your coordination. Ask for help.  Ask for help before getting out of bed. You may need to press your call button.  Ask for assistance in getting you safely to the toilet.  Ask for a walker or cane to be put at your bedside. Ask that most of the side rails on your bed be placed up before your caregiver leaves the room.  Ask family or friends to sit  with you.  Ask for things that are out of your reach, such as your glasses, hearing aids, telephone, bedside table, or call button. Follow these tips to avoid falling:  Stay lying or seated, rather than standing, while waiting for help.  Wear rubber-soled slippers or shoes whenever you walk in the hospital.  Avoid quick, sudden movements.  Change positions slowly.  Sit on the side of your bed before standing.  Stand up slowly and wait before you start to walk.  Let your caregiver know if there is a spill on the floor.  Pay careful attention to the medical equipment, electrical cords, and tubes around you.  When you need help, use your call button by your bed or in the bathroom. Wait for one of your caregivers to help you.  If you feel dizzy or unsure of your footing, return to bed and wait for assistance.  Avoid being distracted by the TV, telephone, or another person in your room.  Do not lean or support yourself on rolling objects, such as IV poles or bedside tables. Document Released: 02/16/2000 Document Revised: 02/05/2012 Document Reviewed: 10/27/2011 [facility] Patient Information 2015 [place], [place]. This information is not intended to replace advice given to you by your health care provider. Make sure you discuss any questions you have with your health care provider.  Fall Prevention and Home Safety Falls cause injuries and can affect all age groups. It is possible to prevent falls.  HOW TO PREVENT FALLS  Wear shoes with rubber soles that do not have an opening for your toes.  Keep the inside and outside of your house well lit.  Use night lights throughout your home.  Remove clutter from floors.  Clean up floor spills.  Remove throw rugs or fasten them to the floor with carpet tape.  Do not place electrical cords across pathways.  Put grab bars by your tub, shower, and toilet. Do not use towel bars as grab bars.  Put handrails on both sides of the stairway.  Fix loose handrails.  Do not climb on stools or stepladders, if possible.  Do not wax your floors.  Repair uneven or unsafe sidewalks, walkways, or stairs.  Keep items you use a lot within reach.  Be aware of pets.  Keep emergency numbers next to the telephone.  Put smoke detectors in your home and near bedrooms. Ask your doctor what other things you can do to prevent falls. Document Released: 12/15/2008 Document Revised: 08/20/2011 Document Reviewed: 05/21/2011 [facility] Patient Information 2015 [place], [place]. This information is not intended to replace advice given to you by your health care provider. Make sure you discuss any questions you have with your health care provider.

## 2014-02-28 ENCOUNTER — Telehealth: Payer: Self-pay | Admitting: *Deleted

## 2014-02-28 ENCOUNTER — Other Ambulatory Visit: Payer: Self-pay | Admitting: Physical Medicine & Rehabilitation

## 2014-02-28 NOTE — Telephone Encounter (Signed)
Dr Letta Pate is out of the office and she needs to see him.  I can find no record of a CSA being signed with our office because initial prescribing of Tylenol #3 was done from phone call and pts are not required monthly visits but every 3 month visits on < CII level narcotics.  She fell and ED visit documented with multiple xrays done and she was given Percocet 5/325 #20 at this visit.  She has also received cough medication containing hydrocodone on 01/04/14 same day as visit with Dr Letta Pate.  Patients are not allowed to take these CII cough medications without permission from our office. Dr Letta Pate is out of the office to review this and I will inform Ashle that when Dr Letta Pate returns I cannot guarantee that he will continue to prescribe for her. I have left a message for her to call the office and at that time I will inquire about the cough medications and may refill the tylenol #4 for one month but make her aware it may be final rx for medication depending on Dr Letta Pate decision.

## 2014-02-28 NOTE — Telephone Encounter (Signed)
Pt called asking [place] to call in a refill for Tylenol #4, pt is probably not aware of new clinic policy requiring an office visit...Marland KitchenMarland KitchenMarland Kitchenpt is also asking for an appt with Dr. Letta Pate....pt fell off her front porch hurting her back and neck

## 2014-03-01 MED ORDER — ACETAMINOPHEN-CODEINE #4 300-60 MG PO TABS: 1.0000 | ORAL_TABLET | Freq: Four times a day (QID) | ORAL | Status: DC | PRN

## 2014-03-01 NOTE — Telephone Encounter (Signed)
I spoke with Emily Peck about the medication issue. She says she was hurting really bad when she went to the ED with her fall. I explained that when going to the Ed she cannot accept medications from any other MD for a narcotic if Dr Letta Pate in prescribing pain medication for her .  She says she was not aware of this and what if she broke something?  I told her that if it were to be the case it would be her responsibility to notify this office before she filled the rx for permission to do so.  The other issue is the cough medication. She was not aware that it had narcotic in it and therefore did not know.  I toldher we have signs posted through out the office stating pts need to be aware of these cough preparations but once again she said she cannot see (blind in one eye) and did not know. I informed her that she is being told and going forward she has been warned.  I find no signed CSA in the chart so I am noting at next visit one MUST BE SIGNED.  I refilled the tylenol #4 for one month and told her it will be up to Dr Letta Pate if he continues to refill when he returns and sees her at the appt she now has on 03/10/14

## 2014-03-07 NOTE — Telephone Encounter (Signed)
Do opioid risk tool when pt returns as well as UDS

## 2014-03-07 NOTE — Telephone Encounter (Signed)
Noted on schedule to do opioid risk tool and UDS and CSA

## 2014-03-10 ENCOUNTER — Other Ambulatory Visit: Payer: Self-pay | Admitting: Physical Medicine & Rehabilitation

## 2014-03-10 ENCOUNTER — Encounter: Payer: Self-pay | Admitting: Physical Medicine & Rehabilitation

## 2014-03-10 ENCOUNTER — Ambulatory Visit (HOSPITAL_BASED_OUTPATIENT_CLINIC_OR_DEPARTMENT_OTHER): Payer: Medicare Other | Admitting: Physical Medicine & Rehabilitation

## 2014-03-10 ENCOUNTER — Encounter: Payer: Medicare Other | Attending: Registered Nurse

## 2014-03-10 VITALS — BP 113/69 | HR 74 | Resp 14

## 2014-03-10 DIAGNOSIS — M7061 Trochanteric bursitis, right hip: Secondary | ICD-10-CM | POA: Insufficient documentation

## 2014-03-10 DIAGNOSIS — Z79899 Other long term (current) drug therapy: Secondary | ICD-10-CM | POA: Diagnosis not present

## 2014-03-10 DIAGNOSIS — Z8673 Personal history of transient ischemic attack (TIA), and cerebral infarction without residual deficits: Secondary | ICD-10-CM | POA: Insufficient documentation

## 2014-03-10 DIAGNOSIS — G894 Chronic pain syndrome: Secondary | ICD-10-CM | POA: Diagnosis not present

## 2014-03-10 DIAGNOSIS — M25551 Pain in right hip: Secondary | ICD-10-CM | POA: Diagnosis present

## 2014-03-10 DIAGNOSIS — M47816 Spondylosis without myelopathy or radiculopathy, lumbar region: Secondary | ICD-10-CM | POA: Diagnosis not present

## 2014-03-10 DIAGNOSIS — M797 Fibromyalgia: Secondary | ICD-10-CM | POA: Diagnosis not present

## 2014-03-10 DIAGNOSIS — M961 Postlaminectomy syndrome, not elsewhere classified: Secondary | ICD-10-CM

## 2014-03-10 DIAGNOSIS — M47817 Spondylosis without myelopathy or radiculopathy, lumbosacral region: Secondary | ICD-10-CM | POA: Diagnosis not present

## 2014-03-10 DIAGNOSIS — M7918 Myalgia, other site: Secondary | ICD-10-CM

## 2014-03-10 DIAGNOSIS — Z5181 Encounter for therapeutic drug level monitoring: Secondary | ICD-10-CM | POA: Diagnosis not present

## 2014-03-10 DIAGNOSIS — Z7901 Long term (current) use of anticoagulants: Secondary | ICD-10-CM | POA: Diagnosis not present

## 2014-03-10 NOTE — Patient Instructions (Signed)
Need to be off Coumadin at least 5 days prior to procedure with PT/INR performed within 24 hours of procedure

## 2014-03-10 NOTE — Progress Notes (Signed)
Subjective:    Patient ID: Emily Peck, female    DOB: 03/21/1955, 59 y.o.   MRN: [id number]  HPI Patient returns today. Chief complaint is neck pain and entire back pain. Patient states she fell off her porch last month. Reviewed ED notes. Patient was treated and released with negative CT of the head, C-spine and no acute changes in her thoracic or lumbar area. Past surgical history significant for T12-L1 fusion   Patient became very upset with office staff when asked to submit a urine drug screen as well as complete paperwork for controlled substance agreement and opioid consent Pain Inventory Average Pain 4 Pain Right Now 7 My pain is constant, sharp and aching  In the last 24 hours, has pain interfered with the following? General activity 4 Relation with others 4 Enjoyment of life 4 What TIME of day is your pain at its worst? daytime , evening, night Sleep (in general) Fair  Pain is worse with: walking, bending, sitting, standing and some activites Pain improves with: rest Relief from Meds: 6  Mobility use a cane  Function retired  Neuro/Psych weakness trouble walking  Prior Studies Any changes since last visit?  no  Physicians involved in your care Any changes since last visit?  no   Family History  Problem Relation Age of Onset  . Cancer Mother   . Diabetes Mother    History   Social History  . Marital Status: Single    Spouse Name: N/A    Number of Children: N/A  . Years of Education: N/A   Social History Main Topics  . Smoking status: Never Smoker   . Smokeless tobacco: Never Used  . Alcohol Use: No  . Drug Use: No  . Sexual Activity: None   Other Topics Concern  . None   Social History Narrative   Past Surgical History  Procedure Laterality Date  . Back surgery    . Knee arthroscopy     Past Medical History  Diagnosis Date  . Stroke   . Anxiety   . Postlaminectomy syndrome, thoracic region   . Postlaminectomy syndrome,  lumbar region   . Lumbosacral spondylosis without myelopathy    BP 113/69 mmHg  Pulse 74  Resp 14  SpO2 99%  Opioid Risk Score:   Fall Risk Score: Moderate Fall Risk (6-13 points) (pt given fall prevention education during todays visit) Review of Systems  Musculoskeletal: Positive for gait problem.  Neurological: Positive for weakness.  All other systems reviewed and are negative.      Objective:   Physical Exam  Cervical spine has tenderness in the cervical paraspinal muscle region upper trapezius area as well as levator area. Neck has good range of motion Upper extremity strength is normal Lumbar spine has diffuse paraspinal tenderness. There is no spinous process tenderness Negative straight leg raising Motor strength is normal in bilateral lower extremities Deep tendon reflexes are normal in bilateral lower extremities  General no acute distress Mood and affect irritable and mildly agitated      Assessment & Plan:  1. Cervicalgia with myofascial pain status post fall with negative CT of the cervical spine. She is one month post. May benefit from trigger point injections. Offered physical therapy but she does not want to miss any more work.  Trigger Point Injection  Indication: Bilateral trapezius bilateral levator Myofascial pain not relieved by medication management and other conservative care.  Informed consent was obtained after describing risk and benefits of the  procedure with the patient, this includes bleeding, bruising, infection and medication side effects.  The patient wishes to proceed and has given written consent.  The patient was placed in a Seated position.  The Bilat upper trap and levator area was marked and prepped with Betadine.  It was entered with a 25-gauge 1-1/2 inch needle and 1 mL of 1% lidocaine was injected into each of 4 trigger points, after negative draw back for blood.  The patient tolerated the procedure well.  Post procedure instructions  were given.  2. Low back pain this is mainly upper lumbar. She has benefited in the past from T12-L1 and L2 medial branch blocks. She has undergone prior fusion at T12-L1. Most recent CT showed no new compression fractures old compression fractures at T11 and T10  We discussed that she must be off Coumadin for 5 days prior to the procedure and have a normal PT INR prior to the procedure she is aware of this.  We also discussed that if she does receive narcotic analgesics and a chronic basis through this office she will need a sign controlled substance agreement as well as opioid consent and submit to urine drug screens. At this point she does not want to pursue this. We will make her injection only as well as okay for nonnarcotic analgesic medications. Patient understands this.  We also discussed that the patient has been disruptive in our office not only today but last visit. She has raised her voice in the hallways when talking with staff. We discussed that if this occurs again she would no longer be a patient at this office

## 2014-03-11 LAB — PMP ALCOHOL METABOLITE (ETG): Ethyl Glucuronide (EtG): NEGATIVE ng/mL

## 2014-03-15 LAB — OPIATES/OPIOIDS (LC/MS-MS)
Codeine Urine: >50000 ng/mL (ref <50)
HYDROMORPHONE: NEGATIVE ng/mL (ref <50)
Hydrocodone: NEGATIVE ng/mL (ref <50)
MORPHINE: 6103 ng/mL (ref <50)
NORHYDROCODONE, UR: NEGATIVE ng/mL (ref <50)
Noroxycodone, Ur: NEGATIVE ng/mL — AB (ref <50)
OXYMORPHONE, URINE: NEGATIVE ng/mL — AB (ref <50)
Oxycodone, ur: NEGATIVE ng/mL — AB (ref <50)

## 2014-03-15 LAB — MEPERIDINE (GC/LC/MS), URINE
MEPERIDINE UR CONFIRM: NEGATIVE ng/mL (ref <100)
Normeperidine (GC/LC/MS), ur confirm: NEGATIVE ng/mL (ref <100)

## 2014-03-17 LAB — PRESCRIPTION MONITORING PROFILE (SOLSTAS)
Amphetamine/Meth: NEGATIVE ng/mL
Barbiturate Screen, Urine: NEGATIVE ng/mL
Benzodiazepine Screen, Urine: NEGATIVE ng/mL
Buprenorphine, Urine: NEGATIVE ng/mL
CANNABINOID SCRN UR: NEGATIVE ng/mL
Carisoprodol, Urine: NEGATIVE ng/mL
Cocaine Metabolites: NEGATIVE ng/mL
Creatinine, Urine: 180.54 mg/dL (ref >20.0)
FENTANYL URINE: NEGATIVE ng/mL
MDMA URINE: NEGATIVE ng/mL
Methadone Screen, Urine: NEGATIVE ng/mL
NITRITES URINE, INITIAL: NEGATIVE ug/mL
OXYCODONE SCRN UR: NEGATIVE ng/mL
PH URINE, INITIAL: 5.1 pH (ref 4.5–8.9)
Propoxyphene: NEGATIVE ng/mL
TRAMADOL UR: NEGATIVE ng/mL
Tapentadol, urine: NEGATIVE ng/mL
ZOLPIDEM, URINE: NEGATIVE ng/mL

## 2014-03-18 NOTE — Progress Notes (Signed)
Urine drug screen for this encounter is consistent for prescribed medications from this office (tylenol #4)  Reported oxycodone from another prescriber and this is absent from result. To MD to see if pr reported taking medication (oxycodone) to him

## 2014-03-21 DIAGNOSIS — M170 Bilateral primary osteoarthritis of knee: Secondary | ICD-10-CM | POA: Diagnosis not present

## 2014-03-21 DIAGNOSIS — Z7901 Long term (current) use of anticoagulants: Secondary | ICD-10-CM | POA: Diagnosis not present

## 2014-03-22 ENCOUNTER — Telehealth: Payer: Self-pay | Admitting: Physical Medicine & Rehabilitation

## 2014-03-23 DIAGNOSIS — H43812 Vitreous degeneration, left eye: Secondary | ICD-10-CM | POA: Diagnosis not present

## 2014-03-23 DIAGNOSIS — H5441 Blindness, right eye, normal vision left eye: Secondary | ICD-10-CM | POA: Diagnosis not present

## 2014-03-23 DIAGNOSIS — H4422 Degenerative myopia, left eye: Secondary | ICD-10-CM | POA: Diagnosis not present

## 2014-03-23 DIAGNOSIS — H26492 Other secondary cataract, left eye: Secondary | ICD-10-CM | POA: Diagnosis not present

## 2014-03-23 NOTE — Telephone Encounter (Signed)
Due to her UDS being negative for the oxycodone and she reported taking it the day of the test, she is being discharged per Dr Letta Pate. A letter will be sent out  With a list of area pain clinics.

## 2014-03-23 NOTE — Telephone Encounter (Signed)
Patient called to XL her next appointment. She states she is not comfortable in our office any longer. She apologized for yelling at the staff. She states her voice carries and it is not intentional. However, she felt that Dr. Letta Pate and I made too big of a deal out of her "loud talking". I reminded her that she was yelling things like 'You all are treating me like a criminal" "Why do I have to take a pee test?" and "this is a wrong"! She states her feelings were hurt. I reminded her that we had a long talk at her appointment where I explained the routines and protocols we were following and why. She remembers our talk and has decided to call back if she wants to be seen or if she needs a note stating we are no longer treating her as a patient. She states she needs to think about it.

## 2014-04-07 ENCOUNTER — Ambulatory Visit: Payer: Medicare Other | Admitting: Physical Medicine & Rehabilitation

## 2014-04-11 DIAGNOSIS — G5752 Tarsal tunnel syndrome, left lower limb: Secondary | ICD-10-CM | POA: Diagnosis not present

## 2014-04-11 DIAGNOSIS — Z7901 Long term (current) use of anticoagulants: Secondary | ICD-10-CM | POA: Diagnosis not present

## 2014-04-11 DIAGNOSIS — M71572 Other bursitis, not elsewhere classified, left ankle and foot: Secondary | ICD-10-CM | POA: Diagnosis not present

## 2014-04-11 DIAGNOSIS — M129 Arthropathy, unspecified: Secondary | ICD-10-CM | POA: Diagnosis not present

## 2014-04-11 DIAGNOSIS — M792 Neuralgia and neuritis, unspecified: Secondary | ICD-10-CM | POA: Diagnosis not present

## 2014-04-11 DIAGNOSIS — I6789 Other cerebrovascular disease: Secondary | ICD-10-CM | POA: Diagnosis not present

## 2014-04-11 DIAGNOSIS — Z9181 History of falling: Secondary | ICD-10-CM | POA: Diagnosis not present

## 2014-04-11 DIAGNOSIS — G5751 Tarsal tunnel syndrome, right lower limb: Secondary | ICD-10-CM | POA: Diagnosis not present

## 2014-04-11 DIAGNOSIS — M71571 Other bursitis, not elsewhere classified, right ankle and foot: Secondary | ICD-10-CM | POA: Diagnosis not present

## 2014-04-20 ENCOUNTER — Encounter: Payer: Medicare Other | Attending: Registered Nurse | Admitting: Registered Nurse

## 2014-04-20 ENCOUNTER — Encounter: Payer: Self-pay | Admitting: Registered Nurse

## 2014-04-20 VITALS — BP 106/67 | HR 68 | Resp 14

## 2014-04-20 DIAGNOSIS — Z79899 Other long term (current) drug therapy: Secondary | ICD-10-CM | POA: Diagnosis not present

## 2014-04-20 DIAGNOSIS — Z5181 Encounter for therapeutic drug level monitoring: Secondary | ICD-10-CM | POA: Diagnosis not present

## 2014-04-20 DIAGNOSIS — M961 Postlaminectomy syndrome, not elsewhere classified: Secondary | ICD-10-CM

## 2014-04-20 DIAGNOSIS — M47816 Spondylosis without myelopathy or radiculopathy, lumbar region: Secondary | ICD-10-CM | POA: Diagnosis not present

## 2014-04-20 DIAGNOSIS — M47817 Spondylosis without myelopathy or radiculopathy, lumbosacral region: Secondary | ICD-10-CM | POA: Diagnosis not present

## 2014-04-20 DIAGNOSIS — Z8673 Personal history of transient ischemic attack (TIA), and cerebral infarction without residual deficits: Secondary | ICD-10-CM | POA: Insufficient documentation

## 2014-04-20 DIAGNOSIS — M25551 Pain in right hip: Secondary | ICD-10-CM | POA: Diagnosis present

## 2014-04-20 DIAGNOSIS — M7061 Trochanteric bursitis, right hip: Secondary | ICD-10-CM | POA: Diagnosis not present

## 2014-04-20 MED ORDER — ACETAMINOPHEN-CODEINE #4 300-60 MG PO TABS: 1.0000 | ORAL_TABLET | Freq: Four times a day (QID) | ORAL | Status: DC | PRN

## 2014-04-20 MED ORDER — METHYLPREDNISOLONE 4 MG PO KIT: PACK | ORAL | Status: DC

## 2014-04-20 NOTE — Progress Notes (Signed)
Subjective:    Patient ID: Emily Peck, female    DOB: 03-17-1955, 60 y.o.   MRN: [id number]  HPI: [name] is a 59 year old female who returns for follow up for chronic pain and medication refill.She says her pain is located in her mid-lower back. She rates her pain 5. Her current exercise regime is performing chair exercises and stretching exercises.  She works full time at [employer] for the [employer] in [place] forty- hours a week. We reviewed the Narcotic Contract and was signed, she verbalizes understanding.  Pain Inventory Average Pain 5 Pain Right Now 5 My pain is constant, sharp, dull, stabbing and aching  In the last 24 hours, has pain interfered with the following? General activity 6 Relation with others 6 Enjoyment of life 6 What TIME of day is your pain at its worst? daytime, evening Sleep (in general) Fair  Pain is worse with: walking, sitting and standing Pain improves with: medication Relief from Meds: 2  Mobility walk with assistance use a cane ability to climb steps?  no do you drive?  no  Function disabled: date disabled .  Neuro/Psych numbness tingling trouble walking spasms dizziness  Prior Studies Any changes since last visit?  no  Physicians involved in your care Any changes since last visit?  no   Family History  Problem Relation Age of Onset  . Cancer Mother   . Diabetes Mother    History   Social History  . Marital Status: Single    Spouse Name: N/A  . Number of Children: N/A  . Years of Education: N/A   Social History Main Topics  . Smoking status: Never Smoker   . Smokeless tobacco: Never Used  . Alcohol Use: No  . Drug Use: No  . Sexual Activity: Not on file   Other Topics Concern  . None   Social History Narrative   Past Surgical History  Procedure Laterality Date  . Back surgery    . Knee arthroscopy     Past Medical History  Diagnosis Date  . Stroke   . Anxiety   . Postlaminectomy  syndrome, thoracic region   . Postlaminectomy syndrome, lumbar region   . Lumbosacral spondylosis without myelopathy    BP 106/67 mmHg  Pulse 68  Resp 14  SpO2 98%  Opioid Risk Score:   Fall Risk Score: Moderate Fall Risk (6-13 points)  Review of Systems  Musculoskeletal: Positive for gait problem.  Neurological: Positive for dizziness and numbness.       Tingling spasms       Objective:   Physical Exam  Constitutional: She is oriented to person, place, and time. She appears well-developed and well-nourished.  HENT:  Head: Normocephalic and atraumatic.  Legally Blind  Neck: Normal range of motion. Neck supple.  Cervical Paraspinal Tenderness: C-3- C-5  Cardiovascular: Normal rate and regular rhythm.   Pulmonary/Chest: Effort normal and breath sounds normal.  Musculoskeletal:  Normal Muscle Bulk and Muscle Testing Reveals: Upper Extremities: Full ROM and Muscle strength 5/5 Thoracic and Lumbar Hypersensitivity Right Greater Trochanteric Tenderness Lower Extremities: Full ROM and Muscle Strength 5/5 Right Lower Extremity Flexion Produces pain into Right Hip Arises from chair with ease Using straight cane for support  Neurological: She is alert and oriented to person, place, and time.  Skin: Skin is warm and dry.  Psychiatric: She has a normal mood and affect.  Nursing note and vitals reviewed.  Assessment & Plan:  1. 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 Refilled: Tylenol #4 one tablet 4 times a day as needed #120 2. Knee pain presumed osteoarthritis: F/U with Dr. Latanya Maudlin  No complaints voiced. 3. Right Greater Trochanteric Bursitis: RX: Medrol Dose Pak  20 minutes of face to face patient care time was spent during this visit. All questions were encouraged and answered.  F/U in 3 months

## 2014-04-21 ENCOUNTER — Telehealth: Payer: Self-pay | Admitting: Registered Nurse

## 2014-04-21 MED ORDER — CYCLOBENZAPRINE HCL 5 MG PO TABS: 5.0000 mg | ORAL_TABLET | Freq: Two times a day (BID) | ORAL | Status: DC | PRN

## 2014-04-22 NOTE — ED Provider Notes (Signed)
I feel patient's Head CT was necessary since she was complaining of neck pain and she forgot what happened. Having amnesia is a strong clinical sign of head injury, therefore necessitating her head CT.  Emily Bucy, MD 04/22/14 [phone]

## 2014-05-02 DIAGNOSIS — H4422 Degenerative myopia, left eye: Secondary | ICD-10-CM | POA: Diagnosis not present

## 2014-05-02 DIAGNOSIS — H35372 Puckering of macula, left eye: Secondary | ICD-10-CM | POA: Diagnosis not present

## 2014-05-02 DIAGNOSIS — H26492 Other secondary cataract, left eye: Secondary | ICD-10-CM | POA: Diagnosis not present

## 2014-05-02 DIAGNOSIS — H43812 Vitreous degeneration, left eye: Secondary | ICD-10-CM | POA: Diagnosis not present

## 2014-05-06 DIAGNOSIS — F315 Bipolar disorder, current episode depressed, severe, with psychotic features: Secondary | ICD-10-CM | POA: Diagnosis not present

## 2014-06-14 DIAGNOSIS — I6789 Other cerebrovascular disease: Secondary | ICD-10-CM | POA: Diagnosis not present

## 2014-06-14 DIAGNOSIS — Z7901 Long term (current) use of anticoagulants: Secondary | ICD-10-CM | POA: Diagnosis not present

## 2014-06-30 DIAGNOSIS — Z970 Presence of artificial eye: Secondary | ICD-10-CM | POA: Diagnosis not present

## 2014-07-12 ENCOUNTER — Ambulatory Visit: Payer: Medicare Other | Admitting: Physical Medicine & Rehabilitation

## 2014-07-13 DIAGNOSIS — F315 Bipolar disorder, current episode depressed, severe, with psychotic features: Secondary | ICD-10-CM | POA: Diagnosis not present

## 2014-07-18 ENCOUNTER — Ambulatory Visit (HOSPITAL_BASED_OUTPATIENT_CLINIC_OR_DEPARTMENT_OTHER): Payer: Medicare Other | Admitting: Physical Medicine & Rehabilitation

## 2014-07-18 ENCOUNTER — Other Ambulatory Visit: Payer: Self-pay | Admitting: Family

## 2014-07-18 ENCOUNTER — Encounter: Payer: Medicare Other | Attending: Registered Nurse

## 2014-07-18 ENCOUNTER — Encounter: Payer: Self-pay | Admitting: Physical Medicine & Rehabilitation

## 2014-07-18 VITALS — BP 126/68 | HR 84 | Resp 14

## 2014-07-18 DIAGNOSIS — Z8673 Personal history of transient ischemic attack (TIA), and cerebral infarction without residual deficits: Secondary | ICD-10-CM | POA: Insufficient documentation

## 2014-07-18 DIAGNOSIS — M25562 Pain in left knee: Secondary | ICD-10-CM

## 2014-07-18 DIAGNOSIS — M47817 Spondylosis without myelopathy or radiculopathy, lumbosacral region: Secondary | ICD-10-CM | POA: Diagnosis not present

## 2014-07-18 DIAGNOSIS — M7062 Trochanteric bursitis, left hip: Secondary | ICD-10-CM

## 2014-07-18 DIAGNOSIS — M47816 Spondylosis without myelopathy or radiculopathy, lumbar region: Secondary | ICD-10-CM | POA: Diagnosis not present

## 2014-07-18 DIAGNOSIS — M961 Postlaminectomy syndrome, not elsewhere classified: Secondary | ICD-10-CM | POA: Diagnosis not present

## 2014-07-18 DIAGNOSIS — M25561 Pain in right knee: Secondary | ICD-10-CM

## 2014-07-18 DIAGNOSIS — M25551 Pain in right hip: Secondary | ICD-10-CM | POA: Diagnosis present

## 2014-07-18 DIAGNOSIS — M7061 Trochanteric bursitis, right hip: Secondary | ICD-10-CM | POA: Insufficient documentation

## 2014-07-18 DIAGNOSIS — R921 Mammographic calcification found on diagnostic imaging of breast: Secondary | ICD-10-CM

## 2014-07-18 MED ORDER — MORPHINE SULFATE 15 MG PO TABS: 15.0000 mg | ORAL_TABLET | Freq: Three times a day (TID) | ORAL | Status: DC | PRN

## 2014-07-18 NOTE — Progress Notes (Signed)
Subjective:    Patient ID: Emily Peck, female    DOB: August 18, 1955, 59 y.o.   MRN: [id number] Chief complaint is bilateral knee pain HPI Patient relays issues with elevated INR Last INR was at 6 - Dr. Silvio Pate suggests no more Tylenol #4 Has had some falls with extensive bruising. Patient is legally blind Independent with all her ADLs and mobility Works at [employer]  Pain Inventory Average Pain 5 Pain Right Now 8 My pain is constant, sharp, burning, stabbing and aching  In the last 24 hours, has pain interfered with the following? General activity 7 Relation with others 8 Enjoyment of life 10 What TIME of day is your pain at its worst? daytime, evening and night Sleep (in general) Poor  Pain is worse with: walking, bending, standing and some activites Pain improves with: rest and medications Relief from Meds: 3  Mobility walk without assistance use a cane how many minutes can you walk? 2-3 ability to climb steps?  no do you drive?  yes  Function retired  Neuro/Psych weakness tingling trouble walking spasms  Prior Studies Any changes since last visit?  no  Physicians involved in your care Any changes since last visit?  no   Family History  Problem Relation Age of Onset  . Cancer Mother   . Diabetes Mother    History   Social History  . Marital Status: Single    Spouse Name: N/A  . Number of Children: N/A  . Years of Education: N/A   Social History Main Topics  . Smoking status: Never Smoker   . Smokeless tobacco: Never Used  . Alcohol Use: No  . Drug Use: No  . Sexual Activity: Not on file   Other Topics Concern  . None   Social History Narrative   Past Surgical History  Procedure Laterality Date  . Back surgery    . Knee arthroscopy     Past Medical History  Diagnosis Date  . Stroke   . Anxiety   . Postlaminectomy syndrome, thoracic region   . Postlaminectomy syndrome, lumbar region   . Lumbosacral spondylosis  without myelopathy    BP 126/68 mmHg  Pulse 84  Resp 14  SpO2 100%  Opioid Risk Score:   Fall Risk Score: Moderate Fall Risk (6-13 points)`1  Depression screen PHQ 2/9  No flowsheet data found.   Review of Systems  HENT: Negative.   Eyes: Negative.   Respiratory: Negative.   Cardiovascular: Negative.   Gastrointestinal: Negative.   Endocrine: Negative.   Genitourinary: Negative.   Musculoskeletal: Positive for myalgias, back pain and arthralgias.  Skin: Negative.   Allergic/Immunologic: Negative.   Neurological: Positive for weakness.       Trouble walking, spasms  Hematological: Negative.   Psychiatric/Behavioral: Negative.        Objective:   Physical Exam  Constitutional: She is oriented to person, place, and time. She appears well-developed and well-nourished.  Eyes:  Legally blind  Neurological: She is alert and oriented to person, place, and time. Gait abnormal.  5/5 bilateral deltoids, biceps, triceps, grip, hip flexor, knee extensor, ankle dorsi flexors are flexor  Ambulatory with a cane however this is mainly due to her visual deficit  Psychiatric: She has a normal mood and affect.  Nursing note and vitals reviewed.   Patient with bruising in the right forearm Old bruises on bilateral legs She has no evidence of knee effusion but there is some pain in the right medial  joint line with palpation as well as with range of motion. No crepitus in the right knee no bruising or swelling Left knee has some tenderness in the popliteal fossa it appears to be at a hamstring insertion site.      Assessment & Plan:  1. Low back pain this is mainly upper lumbar. She has benefited in the past from T12-L1 and L2 medial branch blocks. She has undergone prior fusion at T12-L1. Most recent CT showed no new compression fractures old compression fractures at T11 and T10 Her Tylenol No. 4 has been relieving her pain however she has had some problems with elevated INR and no  dietary or other medication changes that could account for this. The patient thinks that the INR started going up before she was on Tylenol No. 4. Her acetaminophen dose is only 1200 mg per day.  Will try switching her to morphine sulfate 15 mg 3 times a day. Her morphine equivalent for the codeine that she is currently on is about 12 mg 3 times a day. Will reassess in one month, she will also get her INR rechecked in the interval time.  Over half of the 25 min visit was spent counseling and coordinating care.We discussed how her pain medication may contribute to elevated INR however it is not definitely the cause given timeframe.

## 2014-07-18 NOTE — Patient Instructions (Signed)
I'm not sure whether the amount of Tylenol that you are taking would actually elevate your INR however the medication that work on a tried this month has no acetaminophen.

## 2014-07-21 ENCOUNTER — Telehealth: Payer: Self-pay | Admitting: *Deleted

## 2014-07-21 DIAGNOSIS — H0510 Unspecified chronic inflammatory disorders of orbit: Secondary | ICD-10-CM | POA: Diagnosis not present

## 2014-07-21 DIAGNOSIS — Q111 Other anophthalmos: Secondary | ICD-10-CM | POA: Diagnosis not present

## 2014-07-21 NOTE — Telephone Encounter (Signed)
Talked to the pt, informed her of the change to the sig for her morphine 15mg , now QID instead of TID. Consulted with Sybil, we determined that she will need to come in sooner as she will run out of meds with the changes made. Pt rescheduled for 08/09/2014 at 3:00 PM with Danella Sensing

## 2014-07-21 NOTE — Telephone Encounter (Signed)
She may increase the dose to 4 times a day

## 2014-07-21 NOTE — Telephone Encounter (Signed)
Pt called, she states that you switched her to morphine 15 mg TID. Pt says the medication is not helping her with her pain. She states that her pain is excruciating in her knees, back, and hip. She is asking is there any other alternatives

## 2014-08-08 DIAGNOSIS — Z7901 Long term (current) use of anticoagulants: Secondary | ICD-10-CM | POA: Diagnosis not present

## 2014-08-08 DIAGNOSIS — I6789 Other cerebrovascular disease: Secondary | ICD-10-CM | POA: Diagnosis not present

## 2014-08-09 ENCOUNTER — Ambulatory Visit: Payer: Medicare Other | Admitting: Registered Nurse

## 2014-08-09 DIAGNOSIS — H02101 Unspecified ectropion of right upper eyelid: Secondary | ICD-10-CM | POA: Diagnosis not present

## 2014-08-09 DIAGNOSIS — H0589 Other disorders of orbit: Secondary | ICD-10-CM | POA: Diagnosis not present

## 2014-08-09 DIAGNOSIS — H02109 Unspecified ectropion of unspecified eye, unspecified eyelid: Secondary | ICD-10-CM | POA: Diagnosis not present

## 2014-08-09 DIAGNOSIS — H02531 Eyelid retraction right upper eyelid: Secondary | ICD-10-CM | POA: Diagnosis not present

## 2014-08-09 DIAGNOSIS — H02102 Unspecified ectropion of right lower eyelid: Secondary | ICD-10-CM | POA: Diagnosis not present

## 2014-08-09 DIAGNOSIS — D492 Neoplasm of unspecified behavior of bone, soft tissue, and skin: Secondary | ICD-10-CM | POA: Diagnosis not present

## 2014-08-09 DIAGNOSIS — H11241 Scarring of conjunctiva, right eye: Secondary | ICD-10-CM | POA: Diagnosis not present

## 2014-08-09 DIAGNOSIS — Z970 Presence of artificial eye: Secondary | ICD-10-CM | POA: Diagnosis not present

## 2014-08-09 DIAGNOSIS — H0510 Unspecified chronic inflammatory disorders of orbit: Secondary | ICD-10-CM | POA: Diagnosis not present

## 2014-08-10 ENCOUNTER — Encounter: Payer: Medicare Other | Attending: Registered Nurse | Admitting: Registered Nurse

## 2014-08-10 ENCOUNTER — Encounter: Payer: Self-pay | Admitting: Registered Nurse

## 2014-08-10 VITALS — BP 106/63 | HR 66 | Resp 16

## 2014-08-10 DIAGNOSIS — Z8673 Personal history of transient ischemic attack (TIA), and cerebral infarction without residual deficits: Secondary | ICD-10-CM | POA: Insufficient documentation

## 2014-08-10 DIAGNOSIS — M25551 Pain in right hip: Secondary | ICD-10-CM | POA: Diagnosis present

## 2014-08-10 DIAGNOSIS — M47817 Spondylosis without myelopathy or radiculopathy, lumbosacral region: Secondary | ICD-10-CM | POA: Diagnosis not present

## 2014-08-10 DIAGNOSIS — M7061 Trochanteric bursitis, right hip: Secondary | ICD-10-CM

## 2014-08-10 DIAGNOSIS — M7062 Trochanteric bursitis, left hip: Secondary | ICD-10-CM

## 2014-08-10 DIAGNOSIS — M25562 Pain in left knee: Secondary | ICD-10-CM

## 2014-08-10 DIAGNOSIS — Z5181 Encounter for therapeutic drug level monitoring: Secondary | ICD-10-CM

## 2014-08-10 DIAGNOSIS — M47816 Spondylosis without myelopathy or radiculopathy, lumbar region: Secondary | ICD-10-CM | POA: Insufficient documentation

## 2014-08-10 DIAGNOSIS — M7918 Myalgia, other site: Secondary | ICD-10-CM

## 2014-08-10 DIAGNOSIS — M961 Postlaminectomy syndrome, not elsewhere classified: Secondary | ICD-10-CM | POA: Diagnosis not present

## 2014-08-10 DIAGNOSIS — M25561 Pain in right knee: Secondary | ICD-10-CM

## 2014-08-10 DIAGNOSIS — Z79899 Other long term (current) drug therapy: Secondary | ICD-10-CM

## 2014-08-10 DIAGNOSIS — M797 Fibromyalgia: Secondary | ICD-10-CM

## 2014-08-10 MED ORDER — ACETAMINOPHEN-CODEINE #4 300-60 MG PO TABS: 1.0000 | ORAL_TABLET | Freq: Four times a day (QID) | ORAL | Status: DC | PRN

## 2014-08-10 NOTE — Progress Notes (Signed)
Subjective:    Patient ID: Emily Peck, female    DOB: Jan 29, 1956, 59 y.o.   MRN: [id number]  HPI: Emily Peck is a 59 year old female who returns for follow up for chronic pain and medication refill.She says her pain is located in her right eye, lower back and bilateral knees. She rates her pain 7. She's not following her current exercise regime at this time.  She had right eye surgery yesterday a cyst removed had this performed at [facility]. MSO4 discontinued per patient request and Tylenol #4 re-ordered. She states she discussed with PCP.   Pain Inventory Average Pain 7 Pain Right Now 7 My pain is constant  In the last 24 hours, has pain interfered with the following? General activity 7 Relation with others 7 Enjoyment of life 7 What TIME of day is your pain at its worst? all Sleep (in general) Fair  Pain is worse with: walking, bending, sitting, standing and some activites Pain improves with: rest and medication Relief from Meds: 5  Mobility walk with assistance use a cane  Function disabled: date disabled . I need assistance with the following:  meal prep, household duties and shopping  Neuro/Psych trouble walking  Prior Studies Any changes since last visit?  yes  Physicians involved in your care EYE SURGEON Dr Isidoro Donning   Family History  Problem Relation Age of Onset  . Cancer Mother   . Diabetes Mother    History   Social History  . Marital Status: Single    Spouse Name: N/A  . Number of Children: N/A  . Years of Education: N/A   Social History Main Topics  . Smoking status: Never Smoker   . Smokeless tobacco: Never Used  . Alcohol Use: No  . Drug Use: No  . Sexual Activity: Not on file   Other Topics Concern  . None   Social History Narrative   Past Surgical History  Procedure Laterality Date  . Back surgery    . Knee arthroscopy     Past Medical History  Diagnosis Date  . Stroke   . Anxiety   .  Postlaminectomy syndrome, thoracic region   . Postlaminectomy syndrome, lumbar region   . Lumbosacral spondylosis without myelopathy    BP 106/63 mmHg  Pulse 66  Resp 16  SpO2 97%  Opioid Risk Score:   Fall Risk Score: High Fall Risk (>13 points) (has been educated and given handout)`1  Depression screen PHQ 2/9  No flowsheet data found.  Review of Systems  HENT:       Eye surgery on right eye.  Swelling and stitches in place  Musculoskeletal: Positive for back pain and gait problem.  Hematological: Bruises/bleeds easily.       Objective:   Physical Exam  Constitutional: She is oriented to person, place, and time. She appears well-developed and well-nourished.  HENT:  Head: Normocephalic and atraumatic.  Neck: Normal range of motion. Neck supple.  Cardiovascular: Normal rate and regular rhythm.   Pulmonary/Chest: Effort normal and breath sounds normal.  Musculoskeletal:  Normal Muscle Bulk and Muscle Testing Reveals: Upper Extremities: Full ROM and Muscle Strength 5/5 Lumbar Paraspinal Tenderness: L-3- L-5 Bilateral Greater Trochanteric Tenderness Right > Left Lower Extremities: Full ROM and Muscle Strength 5/5 Bilateral Lower Extremity Flexion Produces Pain into Patella's Arises from chair with ease/ Using Straight Cane for support Narrow Based Gait   Neurological: She is alert and oriented to person, place, and time.  Skin: Skin is warm and dry.  Psychiatric: She has a normal mood and affect.  Nursing note and vitals reviewed.         Assessment & Plan:  1. 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 RX: Tylenol #4 one tablet 4 times a day as needed #120 2. Knee pain presumed osteoarthritis: F/U with Dr. Latanya Maudlin  No complaints voiced. 3. Right Greater Trochanteric Bursitis: RX: Medrol Dose Pak  20 minutes of face to face patient care time was spent during this visit. All questions were encouraged and answered.  F/U in 3 months

## 2014-08-11 ENCOUNTER — Ambulatory Visit: Payer: Medicare Other | Admitting: Registered Nurse

## 2014-08-11 ENCOUNTER — Encounter: Payer: Medicare Other | Admitting: Registered Nurse

## 2014-08-12 ENCOUNTER — Ambulatory Visit
Admission: RE | Admit: 2014-08-12 | Discharge: 2014-08-12 | Disposition: A | Discharge status: Home / Self Care | Payer: Medicare Other | Source: Ambulatory Visit | Attending: Family | Admitting: Family

## 2014-08-12 DIAGNOSIS — R921 Mammographic calcification found on diagnostic imaging of breast: Secondary | ICD-10-CM

## 2014-08-23 ENCOUNTER — Ambulatory Visit: Payer: Medicare Other | Admitting: Physical Medicine & Rehabilitation

## 2014-09-16 DIAGNOSIS — Z7901 Long term (current) use of anticoagulants: Secondary | ICD-10-CM | POA: Diagnosis not present

## 2014-09-16 DIAGNOSIS — N939 Abnormal uterine and vaginal bleeding, unspecified: Secondary | ICD-10-CM | POA: Diagnosis not present

## 2014-09-17 ENCOUNTER — Emergency Department (HOSPITAL_COMMUNITY)
Admission: EM | Admit: 2014-09-17 | Discharge: 2014-09-17 | Disposition: A | Discharge status: Home / Self Care | Payer: Medicare Other | Attending: Emergency Medicine | Admitting: Emergency Medicine

## 2014-09-17 ENCOUNTER — Encounter (HOSPITAL_COMMUNITY): Payer: Self-pay | Admitting: Emergency Medicine

## 2014-09-17 DIAGNOSIS — N939 Abnormal uterine and vaginal bleeding, unspecified: Secondary | ICD-10-CM | POA: Insufficient documentation

## 2014-09-17 DIAGNOSIS — R791 Abnormal coagulation profile: Secondary | ICD-10-CM | POA: Diagnosis not present

## 2014-09-17 DIAGNOSIS — Z79899 Other long term (current) drug therapy: Secondary | ICD-10-CM | POA: Insufficient documentation

## 2014-09-17 DIAGNOSIS — F419 Anxiety disorder, unspecified: Secondary | ICD-10-CM | POA: Diagnosis not present

## 2014-09-17 DIAGNOSIS — Z7901 Long term (current) use of anticoagulants: Secondary | ICD-10-CM | POA: Insufficient documentation

## 2014-09-17 DIAGNOSIS — Z8673 Personal history of transient ischemic attack (TIA), and cerebral infarction without residual deficits: Secondary | ICD-10-CM | POA: Insufficient documentation

## 2014-09-17 LAB — URINALYSIS, ROUTINE W REFLEX MICROSCOPIC
BILIRUBIN URINE: NEGATIVE
Glucose, UA: NEGATIVE mg/dL
Ketones, ur: NEGATIVE mg/dL
Leukocytes, UA: NEGATIVE
Nitrite: NEGATIVE
PROTEIN: NEGATIVE mg/dL
Specific Gravity, Urine: 1.017 (ref 1.005–1.030)
Urobilinogen, UA: 0.2 mg/dL (ref 0.0–1.0)
pH: 5.5 (ref 5.0–8.0)

## 2014-09-17 LAB — BASIC METABOLIC PANEL
Anion gap: 8 (ref 5–15)
BUN: 8 mg/dL (ref 6–20)
CHLORIDE: 113 mmol/L — AB (ref 101–111)
CO2: 18 mmol/L — AB (ref 22–32)
Calcium: 8.8 mg/dL — ABNORMAL LOW (ref 8.9–10.3)
Creatinine, Ser: 0.72 mg/dL (ref 0.44–1.00)
GFR calc Af Amer: >60 mL/min (ref >60)
GFR calc non Af Amer: >60 mL/min (ref >60)
Glucose, Bld: 118 mg/dL — ABNORMAL HIGH (ref 65–99)
POTASSIUM: 3.1 mmol/L — AB (ref 3.5–5.1)
Sodium: 139 mmol/L (ref 135–145)

## 2014-09-17 LAB — CBC
HCT: 36.9 % (ref 36.0–46.0)
HEMOGLOBIN: 12.1 g/dL (ref 12.0–15.0)
MCH: 32 pg (ref 26.0–34.0)
MCHC: 32.8 g/dL (ref 30.0–36.0)
MCV: 97.6 fL (ref 78.0–100.0)
Platelets: 206 10*3/uL (ref 150–400)
RBC: 3.78 MIL/uL — ABNORMAL LOW (ref 3.87–5.11)
RDW: 12.9 % (ref 11.5–15.5)
WBC: 6.1 10*3/uL (ref 4.0–10.5)

## 2014-09-17 LAB — PROTIME-INR
INR: 4.86 — ABNORMAL HIGH (ref 0.00–1.49)
Prothrombin Time: 44 seconds — ABNORMAL HIGH (ref 11.6–15.2)

## 2014-09-17 LAB — URINE MICROSCOPIC-ADD ON

## 2014-09-17 MED ORDER — POTASSIUM CHLORIDE CRYS ER 20 MEQ PO TBCR
40.0000 meq | EXTENDED_RELEASE_TABLET | Freq: Once | ORAL | Status: AC
  Administered 2014-09-17: 40 meq via ORAL
  Filled 2014-09-17: qty 2

## 2014-09-17 NOTE — Discharge Instructions (Signed)
Stop taking your coumadin until Monday, call your primary care doctor first thing Monday morning to schedule an appointment for that day to discuss your coumadin dose. Your INR today was 4.86.  Warfarin Coagulopathy Warfarin (Coumadin) coagulopathy refers to bleeding that may occur as a complication of the medicine warfarin. Warfarin is an oral blood thinner (anticoagulant). Warfarin is used for medical conditions where thinning of the blood is needed to prevent blood clots.  CAUSES Bleeding is the most common and most serious complication of warfarin. The amount of bleeding is related to the warfarin dose and length of treatment. In addition, bleeding complications can also occur due to:  Intentional or accidental warfarin overdose.  Underlying medical conditions.  Dietary changes.  Medicine, herbal, supplement, or alcohol interactions. SYMPTOMS Severe bleeding while on warfarin may occur from any tissue or organ. Symptoms of the blood being too thin may include:  Bleeding from the nose or gums.  Blood in bowel movements which may appear as bright red, dark, or black tarry stools.  Blood in the urine which may appear as pink, red, or brown urine.  Unusual bruising or bruising easily.  A cut that does not stop bleeding within 10 minutes.  Vomiting blood or continuous nausea for more than 1 day.  Coughing up blood.  Broken blood vessels in your eye (subconjunctival hemorrhage).  Abdominal or back pain with or without flank bruising.  Sudden, severe headache.  Sudden weakness or numbness of the face, arm, or leg, especially on one side of the body.  Sudden confusion.  Trouble speaking (aphasia) or understanding.  Sudden trouble seeing in one or both eyes.  Sudden trouble walking.  Dizziness.  Loss of balance or coordination.  Vaginal bleeding.  Swelling or pain at an injection site.  Superficial fat tissue death (necrosis) which may cause skin scarring. This is  more common in women and may first present as pain in the waist, thighs, or buttocks.  Fever. HOME CARE INSTRUCTIONS  Always contact your health care provider of any concerns or signs of possible warfarin coagulopathy as soon as possible.  Take warfarin exactly as directed by your health care provider. It is recommended that you take your warfarin dose at the same time of the day. If you have been told to stop taking warfarin, do not resume taking warfarin until directed to do so by your health care provider. Follow your health care provider's instructions if you accidentally take an extra dose or miss a dose of warfarin. It is very important to take warfarin as directed since bleeding or blood clots could result in chronic or permanent injury, pain, or disability.  Keep all follow-up appointments with your health care provider as directed. It is very important to keep your appointments. Not keeping appointments could result in a chronic or permanent injury, pain, or disability because warfarin is a medicine that requires close monitoring.  While taking warfarin, you will need to have regular blood tests to measure your blood clotting time. These blood tests usually include both the prothrombin time (PT) and International Normalized Ratio (INR) tests. The PT and INR results allow your health care provider to adjust your dose of warfarin. The dose can change for many reasons. It is critically important that you have your PT and INR levels drawn exactly as directed. Your warfarin dose may stay the same or change depending on what the PT and INR results are. Be sure to follow up with your health care provider regarding your PT  and INR test results and what your warfarin dosage should be.  Many medicines can interfere with warfarin and affect the PT and INR results. You must tell your health care provider about any and all medicines you take. This includes all vitamins and supplements. Ask your health care  provider before taking these. Prescription and over-the-counter medicine consistency is critical to warfarin management. It is important that potential interactions are checked before you start a new medicine. Be especially cautious with aspirin and anti-inflammatory medicines. Ask your health care provider before taking these. Medicines such as antibiotics and acid-reducing medicine can interact with warfarin and can cause an increased warfarin effect. Warfarin can also interfere with the effectiveness of medicines you are taking. Do not take or discontinue any prescribed or over-the-counter medicine except on the advice of your health care provider or pharmacist.  Some vitamins, supplements, and herbal products interfere with the effectiveness of warfarin. Vitamin E may increase the anticoagulant effects of warfarin. Vitamin K can cause warfarin to be less effective. Do not take or discontinue any vitamin, supplement, or herbal product except on the advice of your health care provider or pharmacist.  Eat what you normally eat and keep the vitamin K content of your diet consistent. Avoid major changes in your diet, or notify your health care provider before changing your diet. Suddenly getting a lot more vitamin K could cause your blood to clot too quickly. A sudden decrease in vitamin K intake could cause your blood to clot too slowly. These changes in vitamin K intake could lead to dangerous blood clotsor to bleeding. To keep your vitamin K intake consistent, you must be aware of which foods contain moderate or high amounts of vitamin K. Some foods high in vitamin K include spinach, kale, broccoli, cabbage, greens, Brussels sprouts, asparagus, bok choy, coleslaw, parsley, and green tea. Arrange a visit with a dietitian to answer your questions.  If you have a loss of appetite or get the stomach flu (viral gastroenteritis), talk to your health care provider as soon as possible. A decrease in your normal  vitamin K intake can make you more sensitive to your usual dose of warfarin.  Some medical conditions may increase your risk for bleeding while you are taking warfarin. A fever, diarrhea lasting more than a day, worsening heart failure, or worsening liver function are some medical conditions that could affect warfarin. Contact your health care provider if you have any of these medical conditions.  Be careful not to cut yourself when using sharp objects or while shaving.  Alcohol can change the body's ability to handle warfarin. It is best to avoid alcoholic drinks or consume only very small amounts while taking warfarin. Notify your health care provider if you change your alcohol intake. A sudden increase in alcohol use can increase your risk of bleeding. Chronic alcohol use can cause warfarin to be less effective.  Limit physical activities or sports that could result in a fall or cause injury.  Do not use warfarin if you are pregnant.  Inform all your health care providers and your dentist that you take warfarin.  Inform all health care providers if you are taking warfarin and aspirin or platelet inhibitor medicines such as clopidogrel, ticagrelor, or prasugrel. Use of these medicines in addition to warfarin can increase your risk of bleeding or death. Taking these medicines together should only be done under the direct care of your health care providers. SEEK IMMEDIATE MEDICAL CARE IF:  You cough up  blood.  You have dark or black stools or there is bright red blood coming from your rectum.  You vomit blood or have nausea for more than 1 day.  You have blood in the urine or pink-colored urine.  You have unusual bruising or have increased bruising.  You have bleeding from the nose or gums that does not stop quickly.  You have a cut that does not stop bleeding within 2-3 minutes.  You have sudden weakness or numbness of the face, arm, or leg, especially on one side of the body.  You  have sudden confusion.  You have trouble speaking (aphasia) or understanding.  You have sudden trouble seeing in one or both eyes.  You have sudden trouble walking.  You have dizziness.  You have a loss of balance or coordination.  You have a sudden, severe headache.  You have a serious fall or head injury, even if you are not bleeding.  You have swelling or pain at an injection site.  You have unexplained tenderness or pain in the abdomen, back, waist, thighs, or buttocks.  You have a fever. Any of these symptoms may represent a serious problem that is an emergency. Do not wait to see if the symptoms will go away. Get medical help right away. Call your local emergency services (911 in [place] Do not drive yourself to the hospital. Document Released: 01/27/2006 Document Revised: 07/05/2013 Document Reviewed: 07/30/2011 [facility] Patient Information 2015 [place], [place]. This information is not intended to replace advice given to you by your health care provider. Make sure you discuss any questions you have with your health care provider.

## 2014-09-17 NOTE — ED Notes (Signed)
PA at bedside.

## 2014-09-17 NOTE — ED Notes (Signed)
Pt from home for eval of pelvic pain and vaginal bleeding x2 days, pt on coumadin for stroke in 2004 and hysterectomy 20 years ago and was sent by PCP for evaluation. Pt denies any dysuria, n/v/d or fevers at this time. Pt states moderate bright red blood noted to underwear, denies passing any clots or blood in stool. Denies any other symptoms.

## 2014-09-17 NOTE — ED Provider Notes (Signed)
CSN: [id number]     Arrival date & time 09/17/14  2263 History   First MD Initiated Contact with Patient 09/17/14 [phone]     Chief Complaint  Patient presents with  . Vaginal Bleeding  . Abdominal Pain     (Consider location/radiation/quality/duration/timing/severity/associated sxs/prior Treatment) HPI Comments: 59 year old female presenting with pelvic pain 1 week with associated vaginal pain that occurred for 2 days. Pain is located across her lower abdomen, described as a "nagging" pain that comes and goes at random and feels like period cramps. Had a hysterectomy 20 years ago but still has her ovaries. 4 days ago, she had 2 days of intermittent vaginal bleeding that appeared to be bright red blood that she noticed in her underwear. 2 days ago, the vaginal bleeding subsided, and yesterday, she saw her PCP who advised her to go to the emergency department for further evaluation since she is on Coumadin. Pt states she did not come yesterday when advised because she was concerned there would be a long wait. One year ago she had a similar episode of vaginal bleeding, saw gynecology and was told there is nothing wrong. States "I don't know if the gynecologist actually checked me". Denies urinary symptoms, fever, chills, nausea, vomiting, diarrhea, bloody stool, lightheadedness, dizziness or weakness. States no sexual intercourse or FB in vagina in over 12 years.  Patient is a 59 y.o. female presenting with vaginal bleeding and abdominal pain. The history is provided by the patient.  Vaginal Bleeding Associated symptoms: abdominal pain   Abdominal Pain Associated symptoms: vaginal bleeding     Past Medical History  Diagnosis Date  . Stroke   . Anxiety   . Postlaminectomy syndrome, thoracic region   . Postlaminectomy syndrome, lumbar region   . Lumbosacral spondylosis without myelopathy    Past Surgical History  Procedure Laterality Date  . Back surgery    . Knee arthroscopy     Family  History  Problem Relation Age of Onset  . Cancer Mother   . Diabetes Mother    History  Substance Use Topics  . Smoking status: Never Smoker   . Smokeless tobacco: Never Used  . Alcohol Use: No   OB History    No data available     Review of Systems  Gastrointestinal: Positive for abdominal pain.  Genitourinary: Positive for vaginal bleeding and pelvic pain.  All other systems reviewed and are negative.     Allergies  Review of patient's allergies indicates no known allergies.  Home Medications   Prior to Admission medications   Medication Sig Start Date End Date Taking? Authorizing Provider  acetaminophen-codeine (TYLENOL #4) 300-60 MG per tablet Take 1 tablet by mouth 4 (four) times daily as needed. Patient taking differently: Take 1 tablet by mouth 4 (four) times daily as needed for moderate pain.  08/10/14  Yes Bayard Hugger, NP  atorvastatin (LIPITOR) 10 MG tablet Take 1 tablet by mouth daily. 07/15/14  Yes Historical Provider, MD  cyclobenzaprine (FLEXERIL) 5 MG tablet Take 1 tablet (5 mg total) by mouth 2 (two) times daily as needed for muscle spasms. 04/21/14   Bayard Hugger, NP  escitalopram (LEXAPRO) 20 MG tablet Take 30 mg by mouth daily. 12/13/13   Historical Provider, MD  furosemide (LASIX) 20 MG tablet Take 1 tablet (20 mg total) by mouth daily. 06/28/13   Jennifer Piepenbrink, PA-C  gabapentin (NEURONTIN) 600 MG tablet Take 600 mg by mouth Three times a day.  11/27/11   Historical Provider,  MD  methylPREDNISolone (MEDROL DOSEPAK) 4 MG tablet follow package directions 04/20/14   Bayard Hugger, NP  neomycin-polymyxin b-dexamethasone (MAXITROL) 3.5-10000-0.1 OINT Place 1 application into the right eye 4 (four) times daily. As directed 08/09/14   Historical Provider, MD  omeprazole (PRILOSEC) 20 MG capsule Take 20 mg by mouth daily.  06/04/13   Historical Provider, MD  topiramate (TOPAMAX) 200 MG tablet Take 200 mg by mouth daily. 07/13/14   Historical Provider, MD   traZODone (DESYREL) 150 MG tablet Take 150 mg by mouth at bedtime. 07/13/14   Historical Provider, MD  warfarin (COUMADIN) 6 MG tablet Take 6-9 mg by mouth daily. Pt takes 6 mg on Tuesday, Thursday ,saturday, and sunday. Pt takes 9 mg on Monday, Wednesday and Friday.    Historical Provider, MD   BP 105/59 mmHg  Pulse 78  Temp(Src) 97.8 F (36.6 C) (Oral)  Resp 16  Ht 5\' 6"  (1.676 m)  Wt 198 lb (89.812 kg)  BMI 31.97 kg/m2  SpO2 100% Physical Exam  Constitutional: She is oriented to person, place, and time. She appears well-developed and well-nourished. No distress.  HENT:  Head: Normocephalic and atraumatic.  Mouth/Throat: Oropharynx is clear and moist.  Eyes: EOM are normal.  Neck: Normal range of motion. Neck supple.  Cardiovascular: Normal rate, regular rhythm and normal heart sounds.   Pulmonary/Chest: Effort normal and breath sounds normal. No respiratory distress.  Abdominal: Soft. Normal appearance and bowel sounds are normal. She exhibits no distension. There is no rigidity, no rebound and no guarding.  Tenderness across lower abdomen "uncomfortable feeling" per pt. No peritoneal signs.  Genitourinary: There is no rash or lesion on the right labia. There is no rash or lesion on the left labia. Right adnexum displays no tenderness. Left adnexum displays no tenderness. No erythema or bleeding in the vagina. No foreign body around the vagina. No signs of injury around the vagina. No vaginal discharge found.  Uterus surgically absent. No bleeding from rectum.  Musculoskeletal: Normal range of motion. She exhibits no edema.  Neurological: She is alert and oriented to person, place, and time. No sensory deficit.  Skin: Skin is warm and dry.  Psychiatric: She has a normal mood and affect. Her behavior is normal.  Nursing note and vitals reviewed.   ED Course  Procedures (including critical care time) Labs Review Labs Reviewed  URINALYSIS, ROUTINE W REFLEX MICROSCOPIC (NOT AT  [facility]) - Abnormal; Notable for the following:    Hgb urine dipstick TRACE (*)    All other components within normal limits  CBC - Abnormal; Notable for the following:    RBC 3.78 (*)    All other components within normal limits  BASIC METABOLIC PANEL - Abnormal; Notable for the following:    Potassium 3.1 (*)    Chloride 113 (*)    CO2 18 (*)    Glucose, Bld 118 (*)    Calcium 8.8 (*)    All other components within normal limits  PROTIME-INR - Abnormal; Notable for the following:    Prothrombin Time 44.0 (*)    INR 4.86 (*)    All other components within normal limits  URINE MICROSCOPIC-ADD ON    Imaging Review No results found.   EKG Interpretation None      MDM   Final diagnoses:  Vaginal bleeding  Elevated INR   Nontoxic appearing, NAD. AF VSS. Hemodynamically stable. No active bleeding noted on pelvic exam. She has not been bleeding for 2 days. Hemoglobin  12.1. Potassium 3.1, replaced orally. INR 4.86. Advised the patient to discontinue warfarin for the next 2 days until she can follow-up with her PCP. Stressed importance of f/u with PCP in 2 days to discuss the Coumadin. Pt states she will call first thing Monday morning to discuss with PCP. Stable for d/c. Return precautions given. Patient states understanding of treatment care plan and is agreeable.  Discussed with attending Dr. Christy Gentles who agrees with plan of care.   Carman Ching, PA-C 09/17/14 0945  Ripley Fraise, MD 09/17/14 1021

## 2014-09-19 DIAGNOSIS — Z7901 Long term (current) use of anticoagulants: Secondary | ICD-10-CM | POA: Diagnosis not present

## 2014-09-26 DIAGNOSIS — M1712 Unilateral primary osteoarthritis, left knee: Secondary | ICD-10-CM | POA: Diagnosis not present

## 2014-09-26 DIAGNOSIS — M545 Low back pain: Secondary | ICD-10-CM | POA: Diagnosis not present

## 2014-10-13 DIAGNOSIS — F315 Bipolar disorder, current episode depressed, severe, with psychotic features: Secondary | ICD-10-CM | POA: Diagnosis not present

## 2014-10-31 DIAGNOSIS — M1712 Unilateral primary osteoarthritis, left knee: Secondary | ICD-10-CM | POA: Diagnosis not present

## 2014-11-10 ENCOUNTER — Other Ambulatory Visit: Payer: Self-pay | Admitting: Registered Nurse

## 2014-11-10 ENCOUNTER — Encounter: Payer: Medicare Other | Attending: Registered Nurse | Admitting: Registered Nurse

## 2014-11-10 ENCOUNTER — Encounter: Payer: Self-pay | Admitting: Registered Nurse

## 2014-11-10 VITALS — BP 99/61 | HR 73

## 2014-11-10 DIAGNOSIS — M47817 Spondylosis without myelopathy or radiculopathy, lumbosacral region: Secondary | ICD-10-CM | POA: Diagnosis not present

## 2014-11-10 DIAGNOSIS — Z8673 Personal history of transient ischemic attack (TIA), and cerebral infarction without residual deficits: Secondary | ICD-10-CM | POA: Diagnosis not present

## 2014-11-10 DIAGNOSIS — M7918 Myalgia, other site: Secondary | ICD-10-CM

## 2014-11-10 DIAGNOSIS — M797 Fibromyalgia: Secondary | ICD-10-CM

## 2014-11-10 DIAGNOSIS — M7061 Trochanteric bursitis, right hip: Secondary | ICD-10-CM | POA: Diagnosis not present

## 2014-11-10 DIAGNOSIS — M25561 Pain in right knee: Secondary | ICD-10-CM | POA: Diagnosis not present

## 2014-11-10 DIAGNOSIS — Z79899 Other long term (current) drug therapy: Secondary | ICD-10-CM | POA: Diagnosis not present

## 2014-11-10 DIAGNOSIS — Z5181 Encounter for therapeutic drug level monitoring: Secondary | ICD-10-CM | POA: Diagnosis not present

## 2014-11-10 DIAGNOSIS — Z7901 Long term (current) use of anticoagulants: Secondary | ICD-10-CM | POA: Diagnosis not present

## 2014-11-10 DIAGNOSIS — M25551 Pain in right hip: Secondary | ICD-10-CM | POA: Diagnosis present

## 2014-11-10 DIAGNOSIS — M47816 Spondylosis without myelopathy or radiculopathy, lumbar region: Secondary | ICD-10-CM | POA: Diagnosis not present

## 2014-11-10 DIAGNOSIS — M25562 Pain in left knee: Secondary | ICD-10-CM

## 2014-11-10 MED ORDER — ACETAMINOPHEN-CODEINE #4 300-60 MG PO TABS: 1.0000 | ORAL_TABLET | Freq: Four times a day (QID) | ORAL | Status: DC | PRN

## 2014-11-10 NOTE — Progress Notes (Signed)
Subjective:    Patient ID: Emily Peck, female    DOB: [birth year], 59 y.o.   MRN: [id number]  HPI: Ms. SILVERIA BOTZ is a 59 year old female who returns for follow up for chronic pain and medication refill.She says her pain is located in her neck, left shoulder, lower back and bilateral knees. She rates her pain 4. Her current exercise regime is performing stretching exercises and walking short distances.  Pain Inventory Average Pain 5 Pain Right Now 4 My pain is na  In the last 24 hours, has pain interfered with the following? General activity 7 Relation with others 7 Enjoyment of life 9 What TIME of day is your pain at its worst? daytime, evening Sleep (in general) Fair  Pain is worse with: walking, bending, sitting, standing and some activites Pain improves with: na Relief from Meds: na  Mobility use a cane ability to climb steps?  yes do you drive?  no  Function Do you have any goals in this area?  no  Neuro/Psych No problems in this area  Prior Studies Any changes since last visit?  no  Physicians involved in your care Any changes since last visit?  no   Family History  Problem Relation Age of Onset  . Cancer Mother   . Diabetes Mother    Social History   Social History  . Marital Status: Single    Spouse Name: N/A  . Number of Children: N/A  . Years of Education: N/A   Social History Main Topics  . Smoking status: Never Smoker   . Smokeless tobacco: Never Used  . Alcohol Use: No  . Drug Use: No  . Sexual Activity: Not Asked   Other Topics Concern  . None   Social History Narrative   Past Surgical History  Procedure Laterality Date  . Back surgery    . Knee arthroscopy     Past Medical History  Diagnosis Date  . Stroke   . Anxiety   . Postlaminectomy syndrome, thoracic region   . Postlaminectomy syndrome, lumbar region   . Lumbosacral spondylosis without myelopathy    BP 99/61 mmHg  Pulse 73  SpO2 97%  Opioid Risk Score:    Fall Risk Score:  `1  Depression screen PHQ 2/9  Depression screen PHQ 2/9 11/10/2014  Decreased Interest 0  Down, Depressed, Hopeless 0  PHQ - 2 Score 0     Review of Systems  All other systems reviewed and are negative.      Objective:   Physical Exam  Constitutional: She is oriented to person, place, and time. She appears well-developed and well-nourished.  HENT:  Head: Normocephalic and atraumatic.  Neck: Normal range of motion. Neck supple.  Cardiovascular: Normal rate and regular rhythm.   Pulmonary/Chest: Effort normal and breath sounds normal.  Musculoskeletal:  Normal Muscle Bulk and Muscle Testing Reveals: Upper Extremities: Full ROM and Muscle Strength 5/5 Left AC Joint Tenderness Thoracic Paraspinal Tenderness: T-1- T-3 Lumbar Paraspinal Tenderness: L-3- L-5 Lower Extremities: Full ROM and Muscle Strength 5/5 Arises from chair with ease  Narrow Based Gait  Neurological: She is alert and oriented to person, place, and time.  Skin: Skin is warm and dry.  Psychiatric: She has a normal mood and affect.  Nursing note and vitals reviewed.         Assessment & Plan:  1. 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 Refilled:Tylenol #4 one tablet 4 times a day as needed #120 2.  Knee pain presumed osteoarthritis: F/U with Dr. Latanya Maudlin   20 minutes of face to face patient care time was spent during this visit. All questions were encouraged and answered.  F/U in 3 months

## 2014-11-11 ENCOUNTER — Other Ambulatory Visit: Payer: Self-pay | Admitting: Registered Nurse

## 2014-11-11 DIAGNOSIS — Z5181 Encounter for therapeutic drug level monitoring: Secondary | ICD-10-CM | POA: Diagnosis not present

## 2014-11-11 DIAGNOSIS — M25561 Pain in right knee: Secondary | ICD-10-CM | POA: Diagnosis not present

## 2014-11-11 DIAGNOSIS — M25562 Pain in left knee: Secondary | ICD-10-CM | POA: Diagnosis not present

## 2014-11-11 DIAGNOSIS — M47817 Spondylosis without myelopathy or radiculopathy, lumbosacral region: Secondary | ICD-10-CM | POA: Diagnosis not present

## 2014-11-11 DIAGNOSIS — Z79899 Other long term (current) drug therapy: Secondary | ICD-10-CM | POA: Diagnosis not present

## 2014-11-12 LAB — PMP ALCOHOL METABOLITE (ETG): ETGU: NEGATIVE ng/mL

## 2014-11-16 DIAGNOSIS — H11231 Symblepharon, right eye: Secondary | ICD-10-CM | POA: Diagnosis not present

## 2014-11-16 DIAGNOSIS — Q111 Other anophthalmos: Secondary | ICD-10-CM | POA: Diagnosis not present

## 2014-11-18 LAB — OPIATES/OPIOIDS (LC/MS-MS)
Codeine Urine: 18668 ng/mL — AB (ref <50)
HYDROCODONE: 97 ng/mL — AB (ref <50)
Hydromorphone: NEGATIVE ng/mL (ref <50)
Morphine Urine: 1743 ng/mL — AB (ref <50)
NOROXYCODONE, UR: NEGATIVE ng/mL (ref <50)
Norhydrocodone, Ur: 111 ng/mL — AB (ref <50)
Oxycodone, ur: NEGATIVE ng/mL (ref <50)
Oxymorphone: NEGATIVE ng/mL (ref <50)

## 2014-11-19 LAB — PRESCRIPTION MONITORING PROFILE (SOLSTAS)
AMPHETAMINE/METH: NEGATIVE ng/mL
BUPRENORPHINE, URINE: NEGATIVE ng/mL
Barbiturate Screen, Urine: NEGATIVE ng/mL
Benzodiazepine Screen, Urine: NEGATIVE ng/mL
CANNABINOID SCRN UR: NEGATIVE ng/mL
CREATININE, URINE: 182.92 mg/dL (ref >20.0)
Carisoprodol, Urine: NEGATIVE ng/mL
Cocaine Metabolites: NEGATIVE ng/mL
FENTANYL URINE: NEGATIVE ng/mL
MDMA URINE: NEGATIVE ng/mL
MEPERIDINE UR: NEGATIVE ng/mL
Methadone Screen, Urine: NEGATIVE ng/mL
Nitrites, Initial: NEGATIVE ug/mL
Oxycodone Screen, Ur: NEGATIVE ng/mL
PH URINE, INITIAL: 5.6 pH (ref 4.5–8.9)
Propoxyphene: NEGATIVE ng/mL
TRAMADOL UR: NEGATIVE ng/mL
Tapentadol, urine: NEGATIVE ng/mL
ZOLPIDEM, URINE: NEGATIVE ng/mL

## 2014-11-23 DIAGNOSIS — Z23 Encounter for immunization: Secondary | ICD-10-CM | POA: Diagnosis not present

## 2014-12-21 DIAGNOSIS — F315 Bipolar disorder, current episode depressed, severe, with psychotic features: Secondary | ICD-10-CM | POA: Diagnosis not present

## 2014-12-30 NOTE — Progress Notes (Signed)
Urine drug screen for this encounter is consistent for prescribed medication 

## 2015-01-18 DIAGNOSIS — E668 Other obesity: Secondary | ICD-10-CM | POA: Diagnosis not present

## 2015-01-18 DIAGNOSIS — Z79899 Other long term (current) drug therapy: Secondary | ICD-10-CM | POA: Diagnosis not present

## 2015-01-18 DIAGNOSIS — R42 Dizziness and giddiness: Secondary | ICD-10-CM | POA: Diagnosis not present

## 2015-01-18 DIAGNOSIS — J398 Other specified diseases of upper respiratory tract: Secondary | ICD-10-CM | POA: Diagnosis not present

## 2015-01-18 DIAGNOSIS — Z6837 Body mass index (BMI) 37.0-37.9, adult: Secondary | ICD-10-CM | POA: Diagnosis not present

## 2015-01-18 DIAGNOSIS — G629 Polyneuropathy, unspecified: Secondary | ICD-10-CM | POA: Diagnosis not present

## 2015-01-18 DIAGNOSIS — Z7901 Long term (current) use of anticoagulants: Secondary | ICD-10-CM | POA: Diagnosis not present

## 2015-02-02 ENCOUNTER — Encounter: Payer: Medicare Other | Attending: Registered Nurse | Admitting: Registered Nurse

## 2015-02-02 ENCOUNTER — Encounter: Payer: Self-pay | Admitting: Registered Nurse

## 2015-02-02 VITALS — BP 123/76 | HR 72

## 2015-02-02 DIAGNOSIS — Z8673 Personal history of transient ischemic attack (TIA), and cerebral infarction without residual deficits: Secondary | ICD-10-CM | POA: Insufficient documentation

## 2015-02-02 DIAGNOSIS — M47817 Spondylosis without myelopathy or radiculopathy, lumbosacral region: Secondary | ICD-10-CM

## 2015-02-02 DIAGNOSIS — M47816 Spondylosis without myelopathy or radiculopathy, lumbar region: Secondary | ICD-10-CM | POA: Diagnosis not present

## 2015-02-02 DIAGNOSIS — M25562 Pain in left knee: Secondary | ICD-10-CM

## 2015-02-02 DIAGNOSIS — M25551 Pain in right hip: Secondary | ICD-10-CM | POA: Diagnosis present

## 2015-02-02 DIAGNOSIS — M25561 Pain in right knee: Secondary | ICD-10-CM

## 2015-02-02 DIAGNOSIS — M797 Fibromyalgia: Secondary | ICD-10-CM | POA: Diagnosis not present

## 2015-02-02 DIAGNOSIS — Z5181 Encounter for therapeutic drug level monitoring: Secondary | ICD-10-CM

## 2015-02-02 DIAGNOSIS — M7061 Trochanteric bursitis, right hip: Secondary | ICD-10-CM | POA: Diagnosis not present

## 2015-02-02 DIAGNOSIS — M7918 Myalgia, other site: Secondary | ICD-10-CM

## 2015-02-02 DIAGNOSIS — Z79899 Other long term (current) drug therapy: Secondary | ICD-10-CM

## 2015-02-02 MED ORDER — ACETAMINOPHEN-CODEINE #4 300-60 MG PO TABS: 1.0000 | ORAL_TABLET | Freq: Four times a day (QID) | ORAL | Status: DC | PRN

## 2015-02-02 NOTE — Progress Notes (Signed)
Subjective:    Patient ID: Emily Peck, female    DOB: [birth year], 59 y.o.   MRN: [id number]  HPI: [name] is a 59 year old female who returns for follow up for chronic pain and medication refill.She says her pain is located in her mid-lower back and bilateral knees. She rates her pain 3. Her current exercise regime is performing stretching exercises and walking short distances.  Pain Inventory Average Pain NA Pain Right Now 3 My pain is NA  In the last 24 hours, has pain interfered with the following? General activity 4 Relation with others 4 Enjoyment of life 6 What TIME of day is your pain at its worst? Daytime, Evening and Night Sleep (in general) Fair  Pain is worse with: walking, bending and standing Pain improves with: NA Relief from Meds: NA  Mobility walk with assistance use a cane  Function Do you have any goals in this area?  no  Neuro/Psych No problems in this area  Prior Studies Any changes since last visit?  no  Physicians involved in your care Any changes since last visit?  no   Family History  Problem Relation Age of Onset  . Cancer Mother   . Diabetes Mother    Social History   Social History  . Marital Status: Single    Spouse Name: N/A  . Number of Children: N/A  . Years of Education: N/A   Social History Main Topics  . Smoking status: Never Smoker   . Smokeless tobacco: Never Used  . Alcohol Use: No  . Drug Use: No  . Sexual Activity: Not on file   Other Topics Concern  . Not on file   Social History Narrative   Past Surgical History  Procedure Laterality Date  . Back surgery    . Knee arthroscopy     Past Medical History  Diagnosis Date  . Stroke   . Anxiety   . Postlaminectomy syndrome, thoracic region   . Postlaminectomy syndrome, lumbar region   . Lumbosacral spondylosis without myelopathy    BP 123/76 mmHg  Pulse 72  SpO2 97%  Opioid Risk Score:   Fall Risk Score:  `1  Depression screen  PHQ 2/9  Depression screen PHQ 2/9 11/10/2014  Decreased Interest 0  Down, Depressed, Hopeless 0  PHQ - 2 Score 0     Review of Systems  All other systems reviewed and are negative.      Objective:   Physical Exam  Constitutional: She is oriented to person, place, and time. She appears well-developed and well-nourished.  HENT:  Head: Normocephalic and atraumatic.  Neck: Normal range of motion. Neck supple.  Cardiovascular: Normal rate and regular rhythm.   Pulmonary/Chest: Effort normal and breath sounds normal.  Musculoskeletal:  Normal Muscle Bulk and Muscle Testing Reveals: Upper Extremities: Full ROM and Muscle Strength 5/5 Lumbar Paraspinal Tenderness: L-3- L-5 Lower Extremities: Full ROM and Muscle Strength 5/5 Right Lower Extremity Flexion Produces Pain into Popliteal Fossa and Patella Left Lower Extremity Flexion Produces Pain into Patella Arises from chair slowly using straight cane for support Narrow Based Gait   Neurological: She is alert and oriented to person, place, and time.  Skin: Skin is warm and dry.  Psychiatric: She has a normal mood and affect.  Nursing note and vitals reviewed.         Assessment & Plan:  1. 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 Refilled:Tylenol #4 one tablet 4 times a  day as needed #120 2. Knee pain presumed osteoarthritis: F/U with Dr. Latanya Maudlin   20 minutes of face to face patient care time was spent during this visit. All questions were encouraged and answered.  F/U in 3 months

## 2015-02-22 ENCOUNTER — Emergency Department (INDEPENDENT_AMBULATORY_CARE_PROVIDER_SITE_OTHER): Payer: Medicare Other

## 2015-02-22 ENCOUNTER — Emergency Department (INDEPENDENT_AMBULATORY_CARE_PROVIDER_SITE_OTHER)
Admission: EM | Admit: 2015-02-22 | Discharge: 2015-02-22 | Disposition: A | Discharge status: Home / Self Care | Payer: Medicare Other | Source: Home / Self Care | Attending: Family Medicine | Admitting: Family Medicine

## 2015-02-22 DIAGNOSIS — S93601A Unspecified sprain of right foot, initial encounter: Secondary | ICD-10-CM

## 2015-02-22 DIAGNOSIS — M7989 Other specified soft tissue disorders: Secondary | ICD-10-CM | POA: Diagnosis not present

## 2015-02-22 NOTE — Discharge Instructions (Signed)
Elastic Bandage and RICE °WHAT DOES AN ELASTIC BANDAGE DO? °Elastic bandages come in different shapes and sizes. They generally provide support to your injury and reduce swelling while you are healing, but they can perform different functions. Your health care provider will help you to decide what is best for your protection, recovery, or rehabilitation following an injury. °WHAT ARE SOME GENERAL TIPS FOR USING AN ELASTIC BANDAGE? °· Use the bandage as directed by the maker of the bandage that you are using. °· Do not wrap the bandage too tightly. This may cut off the circulation in the arm or leg in the area below the bandage. °· If part of your body beyond the bandage becomes blue, numb, cold, swollen, or is more painful, your bandage is most likely too tight. If this occurs, remove your bandage and reapply it more loosely. °· See your health care provider if the bandage seems to be making your problems worse rather than better. °· An elastic bandage should be removed and reapplied every 3-4 hours or as directed by your health care provider. °WHAT IS RICE? °The routine care of many injuries includes rest, ice, compression, and elevation (RICE therapy).  °Rest °Rest is required to allow your body to heal. Generally, you can resume your routine activities when you are comfortable and have been given permission by your health care provider. °Ice °Icing your injury helps to keep the swelling down and it reduces pain. Do not apply ice directly to your skin. °· Put ice in a plastic bag. °· Place a towel between your skin and the bag. °· Leave the ice on for 20 minutes, 2-3 times per day. °Do this for as long as you are directed by your health care provider. °Compression °Compression helps to keep swelling down, gives support, and helps with discomfort. Compression may be done with an elastic bandage. °Elevation °Elevation helps to reduce swelling and it decreases pain. If possible, your injured area should be placed at  or above the level of your heart or the center of your chest. °WHEN SHOULD I SEEK MEDICAL CARE? °You should seek medical care if: °· You have persistent pain and swelling. °· Your symptoms are getting worse rather than improving. °These symptoms may indicate that further evaluation or further X-rays are needed. Sometimes, X-rays may not show a small broken bone (fracture) until a number of days later. Make a follow-up appointment with your health care provider. Ask when your X-ray results will be ready. Make sure that you get your X-ray results. °WHEN SHOULD I SEEK IMMEDIATE MEDICAL CARE? °You should seek immediate medical care if: °· You have a sudden onset of severe pain at or below the area of your injury. °· You develop redness or increased swelling around your injury. °· You have tingling or numbness at or below the area of your injury that does not improve after you remove the elastic bandage. °  °This information is not intended to replace advice given to you by your health care provider. Make sure you discuss any questions you have with your health care provider. °  °Document Released: 08/10/2001 Document Revised: 11/09/2014 Document Reviewed: 10/04/2013 °Elsevier Interactive Patient Education ©2016 [employer] ° °Foot Sprain °A foot sprain is an injury to one of the strong bands of tissue (ligaments) that connect and support the many bones in your feet. The ligament can be stretched too much or it can tear. A tear can be either partial or complete. The severity of the sprain   depends on how much of the ligament was damaged or torn. °CAUSES °A foot sprain is usually caused by suddenly twisting or pivoting your foot. °RISK FACTORS °This injury is more likely to occur in people who: °· Play a sport, such as basketball or football. °· Exercise or play a sport without warming up. °· Start a new workout or sport. °· Suddenly increase how long or hard they exercise or play a sport. °SYMPTOMS °Symptoms of this  condition start soon after an injury and include: °· Pain, especially in the arch of the foot. °· Bruising. °· Swelling. °· Inability to walk or use the foot to support body weight. °DIAGNOSIS °This condition is diagnosed with a medical history and physical exam. You may also have imaging tests, such as: °· X-rays to make sure there are no broken bones (fractures). °· MRI to see if the ligament has torn. °TREATMENT °Treatment varies depending on the severity of your sprain. Mild sprains can be treated with rest, ice, compression, and elevation (RICE). If your ligament is overstretched or partially torn, treatment usually involves keeping your foot in a fixed position (immobilization) for a period of time. To help you do this, your health care provider will apply a bandage, splint, or walking boot to keep your foot from moving until it heals. You may also be advised to use crutches or a scooter for a few weeks to avoid bearing weight on your foot while it is healing. °If your ligament is fully torn, you may need surgery to reconnect the ligament to the bone. After surgery, a cast or splint will be applied and will need to stay on your foot while it heals. °Your health care provider may also suggest exercises or physical therapy to strengthen your foot. °HOME CARE INSTRUCTIONS °If You Have a Bandage, Splint, or Walking Boot: °· Wear it as directed by your health care provider. Remove it only as directed by your health care provider. °· Loosen the bandage, splint, or walking boot if your toes become numb and tingle, or if they turn cold and blue. °Bathing °· If your health care provider approves bathing and showering, cover the bandage or splint with a watertight plastic bag to protect it from water. Do not let the bandage or splint get wet. °Managing Pain, Stiffness, and Swelling  °· If directed, apply ice to the injured area: °¨ Put ice in a plastic bag. °¨ Place a towel between your skin and the bag. °¨ Leave the  ice on for 20 minutes, 2-3 times per day. °· Move your toes often to avoid stiffness and to lessen swelling. °· Raise (elevate) the injured area above the level of your heart while you are sitting or lying down. °Driving °· Do not drive or operate heavy machinery while taking pain medicine. °· Do not drive while wearing a bandage, splint, or walking boot on a foot that you use for driving. °Activity °· Rest as directed by your health care provider. °· Do not use the injured foot to support your body weight until your health care provider says that you can. Use crutches or other supportive devices as directed by your health care provider. °· Ask your health care provider what activities are safe for you. Gradually increase how much and how far you walk until your health care provider says it is safe to return to full activity. °· Do any exercise or physical therapy as directed by your health care provider. °General Instructions °· If a splint   was applied, do not put pressure on any part of it until it is fully hardened. This may take several hours. °· Take medicines only as directed by your health care provider. These include over-the-counter medicines and prescription medicines. °· Keep all follow-up visits as directed by your health care provider. This is important. °· When you can walk without pain, wear supportive shoes that have stiff soles. Do not wear flip-flops, and do not walk barefoot. °SEEK MEDICAL CARE IF: °· Your pain is not controlled with medicine. °· Your bruising or swelling gets worse or does not get better with treatment. °· Your splint or walking boot is damaged. °SEEK IMMEDIATE MEDICAL CARE IF: °· Your foot is numb or blue. °· Your foot feels colder than normal. °  °This information is not intended to replace advice given to you by your health care provider. Make sure you discuss any questions you have with your health care provider. °  °Document Released: 08/10/2001 Document Revised: 07/05/2014  Document Reviewed: 12/22/2013 °Elsevier Interactive Patient Education ©2016 [employer] ° °

## 2015-02-22 NOTE — ED Notes (Signed)
The patient presented to the [facility] with a complaint of an injury to her right foot secondary to a fall that occurred 2 weeks ago. The patient stated that she fell off of a seat on the bus and injured her foot. She stated that it has not gotten better but has swollen and burns.

## 2015-02-22 NOTE — ED Provider Notes (Signed)
CSN: [id number]     Arrival date & time 02/22/15  1530 History   First MD Initiated Contact with Patient 02/22/15 1656     Chief Complaint  Patient presents with  . Fall  . Foot Injury   (Consider location/radiation/quality/duration/timing/severity/associated sxs/prior Treatment) HPI Comments: 59 year old female states that she fell while on this [employer] and injured her left foot and ankle. This occurred 11 days ago. She continues to walk on it but it keeps getting worse. She is complaining of pain primarily to the mid and lateral dorsal aspect of the foot and pain just above the ankle. There is minor swelling. She is having to walk with a cane. Weightbearing makes it worse. Light touch produces severe pain.   Past Medical History  Diagnosis Date  . Stroke ([place])   . Anxiety   . Postlaminectomy syndrome, thoracic region   . Postlaminectomy syndrome, lumbar region   . Lumbosacral spondylosis without myelopathy    Past Surgical History  Procedure Laterality Date  . Back surgery    . Knee arthroscopy     Family History  Problem Relation Age of Onset  . Cancer Mother   . Diabetes Mother    Social History  Substance Use Topics  . Smoking status: Never Smoker   . Smokeless tobacco: Never Used  . Alcohol Use: No   OB History    No data available     Review of Systems  Constitutional: Positive for activity change. Negative for fever and chills.  HENT: Negative.   Respiratory: Negative.   Cardiovascular: Negative.   Musculoskeletal: Positive for gait problem. Negative for back pain and neck pain.       As per HPI  Skin: Negative for color change, pallor and rash.  Neurological: Negative.     Allergies  Review of patient's allergies indicates no known allergies.  Home Medications   Prior to Admission medications   Medication Sig Start Date End Date Taking? Authorizing Provider  acetaminophen-codeine (TYLENOL #4) 300-60 MG tablet Take 1 tablet by mouth 4 (four) times  daily as needed. 02/02/15   Bayard Hugger, NP  atorvastatin (LIPITOR) 10 MG tablet Take 1 tablet by mouth daily. 07/15/14   Historical Provider, MD  cyclobenzaprine (FLEXERIL) 5 MG tablet Take 1 tablet (5 mg total) by mouth 2 (two) times daily as needed for muscle spasms. 04/21/14   Bayard Hugger, NP  escitalopram (LEXAPRO) 20 MG tablet Take 30 mg by mouth daily. 12/13/13   Historical Provider, MD  furosemide (LASIX) 20 MG tablet Take 1 tablet (20 mg total) by mouth daily. 06/28/13   Jennifer Piepenbrink, PA-C  gabapentin (NEURONTIN) 600 MG tablet Take 600 mg by mouth Three times a day.  11/27/11   Historical Provider, MD  meclizine (ANTIVERT) 12.5 MG tablet Take 1 tablet by mouth 3 (three) times daily. 01/18/15   Historical Provider, MD  neomycin-polymyxin b-dexamethasone (MAXITROL) 3.5-10000-0.1 OINT Place 1 application into the right eye 4 (four) times daily. As directed 08/09/14   Historical Provider, MD  omeprazole (PRILOSEC) 20 MG capsule Take 20 mg by mouth daily.  06/04/13   Historical Provider, MD  topiramate (TOPAMAX) 200 MG tablet Take 200 mg by mouth daily. 07/13/14   Historical Provider, MD  traZODone (DESYREL) 150 MG tablet Take 150 mg by mouth at bedtime. 07/13/14   Historical Provider, MD  warfarin (COUMADIN) 6 MG tablet Take 6-9 mg by mouth daily. Pt takes 6 mg on Tuesday, Thursday ,saturday, and sunday. Pt takes  9 mg on Monday, Wednesday and Friday.    Historical Provider, MD   Meds Ordered and Administered this Visit  Medications - No data to display  BP 144/82 mmHg  Pulse 72  Temp(Src) 98.1 F (36.7 C) (Oral)  Resp 18  SpO2 96% No data found.   Physical Exam  Constitutional: She is oriented to person, place, and time. She appears well-developed and well-nourished. No distress.  Neck: Normal range of motion. Neck supple.  Cardiovascular: Normal rate.   Pulmonary/Chest: Effort normal. No respiratory distress.  Musculoskeletal: Normal range of motion. She exhibits tenderness.   Minor swelling over the dorsum of the foot and to the right lateral malleolus. Substantial tenderness over the dorsum of the foot and above the ankle. Some much tenderness at the patient will not allow a good examination. No deformity. No discoloration. Normal warmth.  Neurological: She is alert and oriented to person, place, and time. She exhibits normal muscle tone.  Skin: Skin is warm and dry.  Psychiatric: She has a normal mood and affect.  Nursing note and vitals reviewed.   ED Course  Procedures (including critical care time)  Labs Review Labs Reviewed - No data to display  Imaging Review Dg Ankle Complete Right  02/22/2015  CLINICAL DATA:  Twisting foot and ankle injury, recent fall. Difficulty bearing weight. EXAM: RIGHT ANKLE - COMPLETE 3+ VIEW COMPARISON:  02/22/2015 FINDINGS: Diffuse soft tissue swelling evident. Normal ankle alignment without acute osseous finding, fracture, subluxation, or dislocation. Malleoli, talus and calcaneus appear intact. Minor midfoot degenerative change. Peripheral atherosclerosis noted. IMPRESSION: Soft tissue swelling.  No acute osseous finding. Electronically Signed   By: Jerilynn Mages.  Shick M.D.   On: 02/22/2015 18:22   Dg Foot Complete Right  02/22/2015  CLINICAL DATA:  Status post fall.  Right foot pain. EXAM: RIGHT FOOT COMPLETE - 3+ VIEW COMPARISON:  None. FINDINGS: No acute fracture or dislocation. 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 Osteoarthritis of the talonavicular joint. Plantar calcaneal spur. Relative pes planus. Soft tissue edema along the dorsal aspect of the foot and ankle. IMPRESSION: No acute osseous injury of the right foot. Generalized soft tissue swelling along the dorsal aspect of the foot and ankle. Electronically Signed   By: Kathreen Devoid   On: 02/22/2015 18:17     Visual Acuity Review  Right Eye Distance:   Left Eye Distance:   Bilateral Distance:    Right Eye Near:   Left Eye Near:    Bilateral  Near:         MDM   1. Foot sprain, right, initial encounter    [place]an wrap RICE Limit wt bearing and ambulation    Janne Napoleon, NP 02/22/15 [phone]

## 2015-02-23 NOTE — ED Notes (Signed)
Patient called asking a for a note for her job to allow her to be out of work w her ~4 week old injury , with her injured extremity elevated so it could heel for a couple of weeks, and to have the note faxed to her job. Discussed w provider, D Mabe, NP, who advises we will be able to give her a note to RTW in AM. Patient adamant she was instructed to stay off of her foot at home w it elevated so it could heal, no weight on it , for a couple of weeks. Patient was advised this had already been discussed w her care provider, and we could not give her approval to be at home except until tomorrow. When asked what kind of shoe she is required to wear on her job, she stated it had to be a covered shoe, instead of the bedroom shoes/slipper she had been wearing. Stated the job had not noticed she was wearing a slipper, since she was wearing long pants. Repeated we could not issue a 2 week RTW note for her problem, she stated she would just stay at home. Advised if she decided to stay at home, this would be a decision she made , and it would all be on her. We can not be responsible for her being out w/o a note from a provider. She said, that it would have to all be on her then

## 2015-03-03 DIAGNOSIS — J398 Other specified diseases of upper respiratory tract: Secondary | ICD-10-CM | POA: Diagnosis not present

## 2015-03-03 DIAGNOSIS — Z6837 Body mass index (BMI) 37.0-37.9, adult: Secondary | ICD-10-CM | POA: Diagnosis not present

## 2015-03-03 DIAGNOSIS — E668 Other obesity: Secondary | ICD-10-CM | POA: Diagnosis not present

## 2015-03-03 DIAGNOSIS — G629 Polyneuropathy, unspecified: Secondary | ICD-10-CM | POA: Diagnosis not present

## 2015-03-03 DIAGNOSIS — Z7901 Long term (current) use of anticoagulants: Secondary | ICD-10-CM | POA: Diagnosis not present

## 2015-03-03 DIAGNOSIS — Z79899 Other long term (current) drug therapy: Secondary | ICD-10-CM | POA: Diagnosis not present

## 2015-03-13 DIAGNOSIS — M25512 Pain in left shoulder: Secondary | ICD-10-CM | POA: Diagnosis not present

## 2015-03-13 DIAGNOSIS — M25511 Pain in right shoulder: Secondary | ICD-10-CM | POA: Diagnosis not present

## 2015-03-21 DIAGNOSIS — F315 Bipolar disorder, current episode depressed, severe, with psychotic features: Secondary | ICD-10-CM | POA: Diagnosis not present

## 2015-03-22 DIAGNOSIS — H35372 Puckering of macula, left eye: Secondary | ICD-10-CM | POA: Diagnosis not present

## 2015-03-22 DIAGNOSIS — H26492 Other secondary cataract, left eye: Secondary | ICD-10-CM | POA: Diagnosis not present

## 2015-03-22 DIAGNOSIS — H5441 Blindness, right eye, normal vision left eye: Secondary | ICD-10-CM | POA: Diagnosis not present

## 2015-03-22 DIAGNOSIS — H4422 Degenerative myopia, left eye: Secondary | ICD-10-CM | POA: Diagnosis not present

## 2015-03-22 DIAGNOSIS — H43812 Vitreous degeneration, left eye: Secondary | ICD-10-CM | POA: Diagnosis not present

## 2015-04-10 ENCOUNTER — Emergency Department (HOSPITAL_COMMUNITY)
Admission: EM | Admit: 2015-04-10 | Discharge: 2015-04-10 | Disposition: A | Discharge status: Home / Self Care | Payer: Medicare Other | Attending: Emergency Medicine | Admitting: Emergency Medicine

## 2015-04-10 ENCOUNTER — Encounter (HOSPITAL_COMMUNITY): Payer: Self-pay | Admitting: Cardiology

## 2015-04-10 ENCOUNTER — Emergency Department (HOSPITAL_COMMUNITY): Payer: Medicare Other

## 2015-04-10 DIAGNOSIS — Z79899 Other long term (current) drug therapy: Secondary | ICD-10-CM | POA: Insufficient documentation

## 2015-04-10 DIAGNOSIS — R05 Cough: Secondary | ICD-10-CM | POA: Diagnosis present

## 2015-04-10 DIAGNOSIS — F419 Anxiety disorder, unspecified: Secondary | ICD-10-CM | POA: Diagnosis not present

## 2015-04-10 DIAGNOSIS — R0602 Shortness of breath: Secondary | ICD-10-CM | POA: Diagnosis not present

## 2015-04-10 DIAGNOSIS — Z8673 Personal history of transient ischemic attack (TIA), and cerebral infarction without residual deficits: Secondary | ICD-10-CM | POA: Insufficient documentation

## 2015-04-10 DIAGNOSIS — J069 Acute upper respiratory infection, unspecified: Secondary | ICD-10-CM | POA: Insufficient documentation

## 2015-04-10 DIAGNOSIS — Z7901 Long term (current) use of anticoagulants: Secondary | ICD-10-CM | POA: Diagnosis not present

## 2015-04-10 LAB — CBC WITH DIFFERENTIAL/PLATELET
Basophils Absolute: 0.1 10*3/uL (ref 0.0–0.1)
Basophils Relative: 2 %
Eosinophils Absolute: 0.5 10*3/uL (ref 0.0–0.7)
Eosinophils Relative: 8 %
HCT: 35.6 % — ABNORMAL LOW (ref 36.0–46.0)
Hemoglobin: 11.6 g/dL — ABNORMAL LOW (ref 12.0–15.0)
Lymphocytes Relative: 58 %
Lymphs Abs: 3.3 10*3/uL (ref 0.7–4.0)
MCH: 32.3 pg (ref 26.0–34.0)
MCHC: 32.6 g/dL (ref 30.0–36.0)
MCV: 99.2 fL (ref 78.0–100.0)
Monocytes Absolute: 0.5 10*3/uL (ref 0.1–1.0)
Monocytes Relative: 8 %
Neutro Abs: 1.4 10*3/uL — ABNORMAL LOW (ref 1.7–7.7)
Neutrophils Relative %: 24 %
Platelets: 215 10*3/uL (ref 150–400)
RBC: 3.59 MIL/uL — ABNORMAL LOW (ref 3.87–5.11)
RDW: 13 % (ref 11.5–15.5)
WBC: 5.8 10*3/uL (ref 4.0–10.5)

## 2015-04-10 LAB — BASIC METABOLIC PANEL
Anion gap: 14 (ref 5–15)
BUN: 9 mg/dL (ref 6–20)
CO2: 21 mmol/L — ABNORMAL LOW (ref 22–32)
Calcium: 9 mg/dL (ref 8.9–10.3)
Chloride: 108 mmol/L (ref 101–111)
Creatinine, Ser: 0.73 mg/dL (ref 0.44–1.00)
GFR calc Af Amer: >60 mL/min (ref >60)
GFR calc non Af Amer: >60 mL/min (ref >60)
Glucose, Bld: 97 mg/dL (ref 65–99)
Potassium: 3.9 mmol/L (ref 3.5–5.1)
Sodium: 143 mmol/L (ref 135–145)

## 2015-04-10 LAB — I-STAT TROPONIN, ED: Troponin i, poc: 0 ng/mL (ref 0.00–0.08)

## 2015-04-10 MED ORDER — BENZONATATE 100 MG PO CAPS: 100.0000 mg | ORAL_CAPSULE | Freq: Three times a day (TID) | ORAL | Status: DC

## 2015-04-10 MED ORDER — ALBUTEROL SULFATE HFA 108 (90 BASE) MCG/ACT IN AERS
1.0000 | INHALATION_SPRAY | Freq: Once | RESPIRATORY_TRACT | Status: AC
  Administered 2015-04-10: 2 via RESPIRATORY_TRACT
  Filled 2015-04-10: qty 6.7

## 2015-04-10 MED ORDER — IPRATROPIUM-ALBUTEROL 0.5-2.5 (3) MG/3ML IN SOLN
3.0000 mL | Freq: Once | RESPIRATORY_TRACT | Status: AC
  Administered 2015-04-10: 3 mL via RESPIRATORY_TRACT
  Filled 2015-04-10: qty 3

## 2015-04-10 NOTE — ED Provider Notes (Signed)
CSN: [id number]     Arrival date & time 04/10/15  1014 History   First MD Initiated Contact with Patient 04/10/15 1118     Chief Complaint  Patient presents with  . Cough  . Nasal Congestion    HPI   60 year old female presents today with upper respiratory complaints. Patient reports that 5 days ago she developed a runny nose, congestion, dry nonproductive cough. Patient reports she had associated chest soreness, made worse with coughing. She reports some baseline chest "pressure" that has been present since the initial onset of chest congestion. She denies any radiation of symptoms, nausea, vomiting, shortness of breath, diaphoresis. Patient reports that the chest pressure feels as if her lungs are tight from the congestion.   Past Medical History  Diagnosis Date  . Stroke ([place])   . Anxiety   . Postlaminectomy syndrome, thoracic region   . Postlaminectomy syndrome, lumbar region   . Lumbosacral spondylosis without myelopathy    Past Surgical History  Procedure Laterality Date  . Back surgery    . Knee arthroscopy     Family History  Problem Relation Age of Onset  . Cancer Mother   . Diabetes Mother    Social History  Substance Use Topics  . Smoking status: Never Smoker   . Smokeless tobacco: Never Used  . Alcohol Use: No   OB History    No data available     Review of Systems  All other systems reviewed and are negative.   Allergies  Review of patient's allergies indicates no known allergies.  Home Medications   Prior to Admission medications   Medication Sig Start Date End Date Taking? Authorizing Provider  acetaminophen-codeine (TYLENOL #4) 300-60 MG tablet Take 1 tablet by mouth 4 (four) times daily as needed. Patient taking differently: Take 1 tablet by mouth 4 (four) times daily as needed for moderate pain.  02/02/15  Yes Bayard Hugger, NP  escitalopram (LEXAPRO) 20 MG tablet Take 30 mg by mouth daily. 12/13/13  Yes Historical Provider, MD  gabapentin  (NEURONTIN) 600 MG tablet Take 600 mg by mouth Three times a day.  11/27/11  Yes Historical Provider, MD  omeprazole (PRILOSEC) 20 MG capsule Take 20 mg by mouth daily as needed.  06/04/13  Yes Historical Provider, MD  potassium chloride (K-DUR) 10 MEQ tablet Take 10 mEq by mouth 2 (two) times daily.   Yes Historical Provider, MD  topiramate (TOPAMAX) 200 MG tablet Take 200 mg by mouth daily. 07/13/14  Yes Historical Provider, MD  traZODone (DESYREL) 150 MG tablet Take 150 mg by mouth at bedtime. 07/13/14  Yes Historical Provider, MD  Vitamin D, Ergocalciferol, (DRISDOL) 50000 units CAPS capsule Take 50,000 Units by mouth every 7 (seven) days.   Yes Historical Provider, MD  warfarin (COUMADIN) 6 MG tablet Take 6-9 mg by mouth daily. Pt takes 6 mg on Tuesday, Thursday ,saturday, and sunday. Pt takes 9 mg on Monday, Wednesday and Friday.   Yes Historical Provider, MD  atorvastatin (LIPITOR) 10 MG tablet Take 1 tablet by mouth daily. 07/15/14   Historical Provider, MD  benzonatate (TESSALON) 100 MG capsule Take 1 capsule (100 mg total) by mouth every 8 (eight) hours. 04/10/15   Okey Regal, PA-C  cyclobenzaprine (FLEXERIL) 5 MG tablet Take 1 tablet (5 mg total) by mouth 2 (two) times daily as needed for muscle spasms. Patient not taking: Reported on 04/10/2015 04/21/14   Bayard Hugger, NP  furosemide (LASIX) 20 MG tablet Take 1 tablet (  20 mg total) by mouth daily. Patient not taking: Reported on 04/10/2015 06/28/13   Baron Sane, PA-C  meclizine (ANTIVERT) 12.5 MG tablet Take 1 tablet by mouth 3 (three) times daily. 01/18/15   Historical Provider, MD  neomycin-polymyxin b-dexamethasone (MAXITROL) 3.5-10000-0.1 OINT Place 1 application into the right eye 4 (four) times daily. As directed 08/09/14   Historical Provider, MD   BP 117/80 mmHg  Pulse 68  Temp(Src) 97.7 F (36.5 C) (Oral)  Resp 16  SpO2 99%   Physical Exam  Constitutional: She is oriented to person, place, and time. She appears  well-developed and well-nourished.  HENT:  Head: Normocephalic and atraumatic.  Eyes: Conjunctivae are normal. Pupils are equal, round, and reactive to light. Right eye exhibits no discharge. Left eye exhibits no discharge. No scleral icterus.  Neck: Normal range of motion. No JVD present. No tracheal deviation present.  Cardiovascular: Normal rate and normal heart sounds.  Exam reveals no gallop and no friction rub.   No murmur heard. Pulmonary/Chest: Effort normal and breath sounds normal. No stridor. No respiratory distress. She has no wheezes. She has no rales. She exhibits no tenderness.  Chest mildly tender to palpation no rashes signs trauma  Musculoskeletal: Normal range of motion. She exhibits no edema or tenderness.  Neurological: She is alert and oriented to person, place, and time. Coordination normal.  Psychiatric: She has a normal mood and affect. Her behavior is normal. Judgment and thought content normal.  Nursing note and vitals reviewed.   ED Course  Procedures (including critical care time) Labs Review Labs Reviewed  CBC WITH DIFFERENTIAL/PLATELET - Abnormal; Notable for the following:    RBC 3.59 (*)    Hemoglobin 11.6 (*)    HCT 35.6 (*)    Neutro Abs 1.4 (*)    All other components within normal limits  BASIC METABOLIC PANEL - Abnormal; Notable for the following:    CO2 21 (*)    All other components within normal limits  I-STAT TROPOININ, ED    Imaging Review Dg Chest 2 View  04/10/2015  CLINICAL DATA:  Shortness of breath with cough and congestion for 5 days EXAM: CHEST  2 VIEW COMPARISON:  February 07, 2014 FINDINGS: There is slight scarring in the left lower lobe. There is no edema or consolidation. Heart is upper normal in size with pulmonary vascularity within normal limits. No adenopathy. There is postoperative change in the lower thoracic and upper lumbar regions. IMPRESSION: Mild left lower lobe scarring.  No edema or consolidation. Electronically Signed    By: Lowella Grip III M.D.   On: 04/10/2015 10:56   I have personally reviewed and evaluated these images and lab results as part of my medical decision-making.   EKG Interpretation None      MDM   Final diagnoses:  URI (upper respiratory infection)    Labs: CBC, BMP, I stat troponin - no sig findings   Imaging: DG chest- negative for acute findings  Consults:  Therapeutics: DuoNeb  Discharge Meds: Albuterol, Tessalon  Assessment/Plan: This is a 60 year old female presents today with complaints of upper respiratory infection. This is likely a viral URI. Patient is afebrile, nontoxic but reassuring vital signs. Her oxygen was 99%, her lung sounds were clear. She was given a DuoNeb here which significantly improved her respiratory complaints including chest pressure. I have very low suspicion that this is cardiac in nature, she had a negative troponin, noncontributory EKG, improved with DuoNeb. Patient is requesting discharge home. She will  be given a albuterol inhaler, cough medication, encouraged to contact her primary care provider tomorrow and schedule follow-up evaluation.           Okey Regal, PA-C 04/10/15 [place], MD 04/11/15 [phone]

## 2015-04-10 NOTE — Discharge Instructions (Signed)
Upper Respiratory Infection, Adult Most upper respiratory infections (URIs) are a viral infection of the air passages leading to the lungs. A URI affects the nose, throat, and upper air passages. The most common type of URI is nasopharyngitis and is typically referred to as "the common cold." URIs run their course and usually go away on their own. Most of the time, a URI does not require medical attention, but sometimes a bacterial infection in the upper airways can follow a viral infection. This is called a secondary infection. Sinus and middle ear infections are common types of secondary upper respiratory infections. Bacterial pneumonia can also complicate a URI. A URI can worsen asthma and chronic obstructive pulmonary disease (COPD). Sometimes, these complications can require emergency medical care and may be life threatening.  CAUSES Almost all URIs are caused by viruses. A virus is a type of germ and can spread from one person to another.  RISKS FACTORS You may be at risk for a URI if:   You smoke.   You have chronic heart or lung disease.  You have a weakened defense (immune) system.   You are very young or very old.   You have nasal allergies or asthma.  You work in crowded or poorly ventilated areas.  You work in health care facilities or schools. SIGNS AND SYMPTOMS  Symptoms typically develop 2-3 days after you come in contact with a cold virus. Most viral URIs last 7-10 days. However, viral URIs from the influenza virus (flu virus) can last 14-18 days and are typically more severe. Symptoms may include:   Runny or stuffy (congested) nose.   Sneezing.   Cough.   Sore throat.   Headache.   Fatigue.   Fever.   Loss of appetite.   Pain in your forehead, behind your eyes, and over your cheekbones (sinus pain).  Muscle aches.  DIAGNOSIS  Your health care provider may diagnose a URI by:  Physical exam.  Tests to check that your symptoms are not due to  another condition such as:  Strep throat.  Sinusitis.  Pneumonia.  Asthma. TREATMENT  A URI goes away on its own with time. It cannot be cured with medicines, but medicines may be prescribed or recommended to relieve symptoms. Medicines may help:  Reduce your fever.  Reduce your cough.  Relieve nasal congestion. HOME CARE INSTRUCTIONS   Take medicines only as directed by your health care provider.   Gargle warm saltwater or take cough drops to comfort your throat as directed by your health care provider.  Use a warm mist humidifier or inhale steam from a shower to increase air moisture. This may make it easier to breathe.  Drink enough fluid to keep your urine clear or pale yellow.   Eat soups and other clear broths and maintain good nutrition.   Rest as needed.   Return to work when your temperature has returned to normal or as your health care provider advises. You may need to stay home longer to avoid infecting others. You can also use a face mask and careful hand washing to prevent spread of the virus.  Increase the usage of your inhaler if you have asthma.   Do not use any tobacco products, including cigarettes, chewing tobacco, or electronic cigarettes. If you need help quitting, ask your health care provider. PREVENTION  The best way to protect yourself from getting a cold is to practice good hygiene.   Avoid oral or hand contact with people with cold  symptoms.   Wash your hands often if contact occurs.  There is no clear evidence that vitamin C, vitamin E, echinacea, or exercise reduces the chance of developing a cold. However, it is always recommended to get plenty of rest, exercise, and practice good nutrition.  SEEK MEDICAL CARE IF:   You are getting worse rather than better.   Your symptoms are not controlled by medicine.   You have chills.  You have worsening shortness of breath.  You have brown or red mucus.  You have yellow or brown nasal  discharge.  You have pain in your face, especially when you bend forward.  You have a fever.  You have swollen neck glands.  You have pain while swallowing.  You have white areas in the back of your throat. SEEK IMMEDIATE MEDICAL CARE IF:   You have severe or persistent:  Headache.  Ear pain.  Sinus pain.  Chest pain.  You have chronic lung disease and any of the following:  Wheezing.  Prolonged cough.  Coughing up blood.  A change in your usual mucus.  You have a stiff neck.  You have changes in your:  Vision.  Hearing.  Thinking.  Mood. MAKE SURE YOU:   Understand these instructions.  Will watch your condition.  Will get help right away if you are not doing well or get worse.   This information is not intended to replace advice given to you by your health care provider. Make sure you discuss any questions you have with your health care provider.   Document Released: 08/14/2000 Document Revised: 07/05/2014 Document Reviewed: 05/26/2013 Elsevier Interactive Patient Education 2016 Elsevier Inc.  Labs:  Imaging:  Consults:  Therapeutics:  Discharge Meds:   Assessment/Plan:

## 2015-04-10 NOTE — ED Notes (Signed)
Pt reports a cough, and congestion since Wednesday. States that she tried OTC medication without much relief.

## 2015-05-01 DIAGNOSIS — J398 Other specified diseases of upper respiratory tract: Secondary | ICD-10-CM | POA: Diagnosis not present

## 2015-05-01 DIAGNOSIS — E668 Other obesity: Secondary | ICD-10-CM | POA: Diagnosis not present

## 2015-05-01 DIAGNOSIS — Z7901 Long term (current) use of anticoagulants: Secondary | ICD-10-CM | POA: Diagnosis not present

## 2015-05-01 DIAGNOSIS — G629 Polyneuropathy, unspecified: Secondary | ICD-10-CM | POA: Diagnosis not present

## 2015-05-01 DIAGNOSIS — R51 Headache: Secondary | ICD-10-CM | POA: Diagnosis not present

## 2015-05-01 DIAGNOSIS — M79602 Pain in left arm: Secondary | ICD-10-CM | POA: Diagnosis not present

## 2015-05-02 ENCOUNTER — Ambulatory Visit: Payer: Medicare Other | Admitting: Physical Medicine & Rehabilitation

## 2015-05-08 ENCOUNTER — Ambulatory Visit (HOSPITAL_BASED_OUTPATIENT_CLINIC_OR_DEPARTMENT_OTHER): Payer: Medicare Other | Admitting: Physical Medicine & Rehabilitation

## 2015-05-08 ENCOUNTER — Encounter: Payer: Medicare Other | Attending: Registered Nurse

## 2015-05-08 ENCOUNTER — Encounter: Payer: Self-pay | Admitting: Physical Medicine & Rehabilitation

## 2015-05-08 VITALS — BP 127/59 | HR 74 | Resp 14

## 2015-05-08 DIAGNOSIS — Z5181 Encounter for therapeutic drug level monitoring: Secondary | ICD-10-CM

## 2015-05-08 DIAGNOSIS — M7061 Trochanteric bursitis, right hip: Secondary | ICD-10-CM | POA: Diagnosis not present

## 2015-05-08 DIAGNOSIS — M25551 Pain in right hip: Secondary | ICD-10-CM | POA: Diagnosis present

## 2015-05-08 DIAGNOSIS — Z79899 Other long term (current) drug therapy: Secondary | ICD-10-CM | POA: Diagnosis not present

## 2015-05-08 DIAGNOSIS — M961 Postlaminectomy syndrome, not elsewhere classified: Secondary | ICD-10-CM | POA: Diagnosis not present

## 2015-05-08 DIAGNOSIS — G894 Chronic pain syndrome: Secondary | ICD-10-CM | POA: Diagnosis not present

## 2015-05-08 DIAGNOSIS — Z8673 Personal history of transient ischemic attack (TIA), and cerebral infarction without residual deficits: Secondary | ICD-10-CM | POA: Diagnosis not present

## 2015-05-08 DIAGNOSIS — M47816 Spondylosis without myelopathy or radiculopathy, lumbar region: Secondary | ICD-10-CM | POA: Diagnosis not present

## 2015-05-08 DIAGNOSIS — M47817 Spondylosis without myelopathy or radiculopathy, lumbosacral region: Secondary | ICD-10-CM | POA: Diagnosis not present

## 2015-05-08 MED ORDER — ACETAMINOPHEN-CODEINE #4 300-60 MG PO TABS: 1.0000 | ORAL_TABLET | Freq: Four times a day (QID) | ORAL | Status: DC | PRN

## 2015-05-08 MED ORDER — CARISOPRODOL 350 MG PO TABS: 350.0000 mg | ORAL_TABLET | Freq: Three times a day (TID) | ORAL | Status: DC | PRN

## 2015-05-08 NOTE — Progress Notes (Signed)
Subjective:    Patient ID: Emily Peck, female    DOB: 27-Oct-1955, 60 y.o.   MRN: [id number] Chief complaint is increasing low back and buttock pain. Bilateral, no new trauma HPI 60 year old female with chronic low back pain. She's had thoracic compression fractures T10-T11 she has undergoneT12-L1 fusion. She's had good relief with lumbar medial branch blocks L3 L4 L5 as well as T 12 L1-L2. She has had worsening of her low back pain rather than her mid back pain. She has had no falls or new trauma. Left-sided radiofrequency procedures done at L3 L4 L5 in 2013 as well as 2014. No recent repeat procedures.  Patient is on Coumadin. She states that she has been off Coumadin for procedures in the past. She states that her INR goes down after being off of Coumadin for about 4 days.  She also complains of back spasms. She talked to her sister who recommended Valium since that is what she is on. We did discuss this and we went over the  Potential interaction with Tylenol with Codeine.  Pain Inventory Average Pain 4 Pain Right Now 6 My pain is sharp and stabbing  In the last 24 hours, has pain interfered with the following? General activity 6 Relation with others 6 Enjoyment of life 7 What TIME of day is your pain at its worst? daytime, evening, night Sleep (in general) Fair  Pain is worse with: walking, bending and standing Pain improves with: NA Relief from Meds: NA  Mobility Do you have any goals in this area?  no  Function Do you have any goals in this area?  no  Neuro/Psych spasms  Prior Studies Any changes since last visit?  no  Physicians involved in your care Any changes since last visit?  no   Family History  Problem Relation Age of Onset  . Cancer Mother   . Diabetes Mother    Social History   Social History  . Marital Status: Single    Spouse Name: N/A  . Number of Children: N/A  . Years of Education: N/A   Social History Main Topics  . Smoking  status: Never Smoker   . Smokeless tobacco: Never Used  . Alcohol Use: No  . Drug Use: No  . Sexual Activity: Not Asked   Other Topics Concern  . None   Social History Narrative   Past Surgical History  Procedure Laterality Date  . Back surgery    . Knee arthroscopy     Past Medical History  Diagnosis Date  . Stroke ([place])   . Anxiety   . Postlaminectomy syndrome, thoracic region   . Postlaminectomy syndrome, lumbar region   . Lumbosacral spondylosis without myelopathy    BP 127/59 mmHg  Pulse 74  Resp 14  SpO2 98%  Opioid Risk Score:   Fall Risk Score:  `1  Depression screen PHQ 2/9  Depression screen [facility] 2/9 05/08/2015 11/10/2014  Decreased Interest 2 0  Down, Depressed, Hopeless 1 0  PHQ - 2 Score 3 0  Altered sleeping 2 -  Tired, decreased energy 2 -  Change in appetite 2 -  Feeling bad or failure about yourself  2 -  Trouble concentrating 2 -  Moving slowly or fidgety/restless 1 -  Suicidal thoughts 0 -  PHQ-9 Score 14 -  Difficult doing work/chores Somewhat difficult -     Review of Systems  Neurological:       Spasms   All other systems reviewed and  are negative.      Objective:   Physical Exam  Constitutional: She is oriented to person, place, and time. She appears well-developed and well-nourished.  HENT:  Head: Normocephalic and atraumatic.  Eyes:  Very low visual acuity bilaterally, unable to read normal present  Neurological: She is alert and oriented to person, place, and time. She has normal strength. No sensory deficit.  5/5 bilateral hip flexor and extensor ankle dorsiflexors Lower extremity light touch sensation intact  Negative straight leg raising  Nursing note and vitals reviewed.   Patient has tenderness palpation in the lower lumbar paraspinals as well as the upper lumbar paraspinals. She does have healed incision in the lower thoracic upper lumbar area. She has 75% lumbar flexion but lumbar extension limited to 25% of normal.  Patient has pain in the low back area when she hyperextends her back or leans to the left or right side.      Assessment & Plan:  1. Low back pain this is mainly Lower  lumbar. At last visit in May 2016 patient's pain was primarily upper lumbar.. She has benefited in the past from L3-4-5 medial branch blocks. She has undergone prior fusion at T12-L1.Would recommend repeat medial branch blocks and if only temporary improvement would proceed to repeat L3 L4 L5 lumbar radiofrequency Ablation procedure.  In the meantime we'll prescribe a muscle relaxer. She did not have good results with Flexeril in the past. Her [employer] Medicare formulary is limited to [place]. We'll give a trial of this short-term but would not like to have her on this long-term. We'll write a 2 week supply  Will get the okay from her primary care physician to come off of Coumadin. Will need to be off the medication for at least 4 days check a pro-time and then have the procedure the next day continuing to hold Coumadin until after the procedure.  We discussed the pros and cons of procedure. We discussed pros and cons of medication. She elects to proceed with the above recommendations.

## 2015-05-08 NOTE — Patient Instructions (Signed)
Will need to be off of Coumadin for 4 days then check pro-time  The day before the injection. This can be done at [facility] long. Stay off Coumadin until after the injection

## 2015-05-13 LAB — TOXASSURE SELECT,+ANTIDEPR,UR: PDF: 0

## 2015-05-13 LAB — 6-ACETYLMORPHINE,TOXASSURE ADD
6-ACETYLMORPHINE: NEGATIVE
6-ACETYLMORPHINE: NOT DETECTED ng/mg{creat}

## 2015-05-15 NOTE — Progress Notes (Signed)
Urine drug screen for this encounter is consistent for prescribed medication 

## 2015-05-17 DIAGNOSIS — Z79899 Other long term (current) drug therapy: Secondary | ICD-10-CM | POA: Diagnosis not present

## 2015-05-17 DIAGNOSIS — R457 State of emotional shock and stress, unspecified: Secondary | ICD-10-CM | POA: Diagnosis not present

## 2015-05-17 DIAGNOSIS — R601 Generalized edema: Secondary | ICD-10-CM | POA: Diagnosis not present

## 2015-05-17 DIAGNOSIS — R635 Abnormal weight gain: Secondary | ICD-10-CM | POA: Diagnosis not present

## 2015-05-18 DIAGNOSIS — Z79899 Other long term (current) drug therapy: Secondary | ICD-10-CM | POA: Diagnosis not present

## 2015-05-18 DIAGNOSIS — R601 Generalized edema: Secondary | ICD-10-CM | POA: Diagnosis not present

## 2015-05-18 DIAGNOSIS — R638 Other symptoms and signs concerning food and fluid intake: Secondary | ICD-10-CM | POA: Diagnosis not present

## 2015-05-29 DIAGNOSIS — M1712 Unilateral primary osteoarthritis, left knee: Secondary | ICD-10-CM | POA: Diagnosis not present

## 2015-05-29 DIAGNOSIS — M1711 Unilateral primary osteoarthritis, right knee: Secondary | ICD-10-CM | POA: Diagnosis not present

## 2015-06-02 ENCOUNTER — Telehealth: Payer: Self-pay | Admitting: *Deleted

## 2015-06-02 NOTE — Telephone Encounter (Signed)
I called and verified with Emily Peck that she has been off her coumadin.  She has been off since 05/31/15 and will got to [facility] to get PT/INR Monday before she comes for her injection.

## 2015-06-05 ENCOUNTER — Ambulatory Visit (HOSPITAL_BASED_OUTPATIENT_CLINIC_OR_DEPARTMENT_OTHER): Payer: Medicare Other | Admitting: Physical Medicine & Rehabilitation

## 2015-06-05 ENCOUNTER — Encounter: Payer: Self-pay | Admitting: Physical Medicine & Rehabilitation

## 2015-06-05 ENCOUNTER — Ambulatory Visit: Payer: Medicare Other | Admitting: Physical Medicine & Rehabilitation

## 2015-06-05 ENCOUNTER — Encounter: Payer: Medicare Other | Attending: Registered Nurse

## 2015-06-05 ENCOUNTER — Other Ambulatory Visit (HOSPITAL_COMMUNITY)
Admission: RE | Admit: 2015-06-05 | Discharge: 2015-06-05 | Disposition: A | Discharge status: Home / Self Care | Payer: Medicare Other | Source: Ambulatory Visit | Attending: Physical Medicine & Rehabilitation | Admitting: Physical Medicine & Rehabilitation

## 2015-06-05 VITALS — BP 119/69 | HR 62 | Resp 16

## 2015-06-05 DIAGNOSIS — Z8673 Personal history of transient ischemic attack (TIA), and cerebral infarction without residual deficits: Secondary | ICD-10-CM | POA: Diagnosis not present

## 2015-06-05 DIAGNOSIS — M25551 Pain in right hip: Secondary | ICD-10-CM | POA: Diagnosis present

## 2015-06-05 DIAGNOSIS — Z5181 Encounter for therapeutic drug level monitoring: Secondary | ICD-10-CM | POA: Insufficient documentation

## 2015-06-05 DIAGNOSIS — M47817 Spondylosis without myelopathy or radiculopathy, lumbosacral region: Secondary | ICD-10-CM | POA: Diagnosis not present

## 2015-06-05 DIAGNOSIS — M7061 Trochanteric bursitis, right hip: Secondary | ICD-10-CM | POA: Diagnosis not present

## 2015-06-05 DIAGNOSIS — M1711 Unilateral primary osteoarthritis, right knee: Secondary | ICD-10-CM | POA: Diagnosis not present

## 2015-06-05 DIAGNOSIS — M47816 Spondylosis without myelopathy or radiculopathy, lumbar region: Secondary | ICD-10-CM | POA: Insufficient documentation

## 2015-06-05 LAB — PROTIME-INR
INR: 1 (ref 0.00–1.49)
Prothrombin Time: 13.4 seconds (ref 11.6–15.2)

## 2015-06-05 NOTE — Patient Instructions (Signed)

## 2015-06-05 NOTE — Progress Notes (Signed)
  [facility]   Name: Emily Peck DOB:07-Sep-1955 MRN: [id number]  Date:06/05/2015  Physician: Alysia Penna, MD    Nurse/CMA:Shumaker RN  Allergies: No Known Allergies  Consent Signed: Yes.    Is patient diabetic? No.  CBG today?   Pregnant: No. LMP: No LMP recorded. Patient has had a hysterectomy. (age [age])  Anticoagulants: yes (coumadin.  Held since 05/31/15 and PT INR 13.4/1.0) Anti-inflammatory: no Antibiotics: no  Procedure: bilateral Medial Branch Blocks L3-4-5 Position: Prone Start Time:315   End Time:325  Fluoro Time: 37 sec  RN/CMA Health and safety inspector RN    Time 245 331    BP 119/69 124/61    Pulse 62 74    Respirations 16 16    O2 Sat 99 100    S/S 6 6    Pain Level 4/10 0/10     D/C home with SCAT, patient A & O X 3, D/C instructions reviewed, and sits independently.

## 2015-06-05 NOTE — Progress Notes (Signed)
Bilateral Lumbar L3, L4  medial branch blocks and L 5 dorsal ramus injection under fluoroscopic guidance  Indication: Lumbar pain which is not relieved by medication management or other conservative care and interfering with self-care and mobility.  Informed consent was obtained after describing risks and benefits of the procedure with the patient, this includes bleeding, infection, paralysis and medication side effects.  The patient wishes to proceed and has given written consent.  The patient was placed in prone position.  The lumbar area was marked and prepped with Betadine.  One mL of 1% lidocaine was injected into each of 6 areas into the skin and subcutaneous tissue.  Then a 22-gauge 3.5in spinal needle was inserted targeting the junction of the left S1 superior articular process and sacral ala junction. Needle was advanced under fluoroscopic guidance.  Bone contact was made.  Omnipaque 180 was injected x 0.5 mL demonstrating no intravascular uptake.  Then a solution containing one mL of 4 mg per mL dexamethasone and 3 mL of 2% MPF lidocaine was injected x 0.5 mL.  Then the left L5 superior articular process in transverse process junction was targeted.  Bone contact was made.  Omnipaque 180 was injected x 0.5 mL demonstrating no intravascular uptake. Then a solution containing one mL of 4 mg per mL dexamethasone and 3 mL of 2% MPF lidocaine was injected x 0.5 mL.  Then the left L4 superior articular process in transverse process junction was targeted.  Bone contact was made.  Omnipaque 180 was injected x 0.5 mL demonstrating no intravascular uptake.  Then a solution containing one mL of 4 mg per mL dexamethasone and 3 mL if 2% MPF lidocaine was injected x 0.5 mL.  This same procedure was performed on the right side using the same needle, technique and injectate.  Patient tolerated procedure well.  Post procedure instructions were given.

## 2015-06-06 ENCOUNTER — Encounter: Payer: Self-pay | Admitting: Physical Medicine & Rehabilitation

## 2015-06-07 DIAGNOSIS — Z7901 Long term (current) use of anticoagulants: Secondary | ICD-10-CM | POA: Diagnosis not present

## 2015-06-07 DIAGNOSIS — M25572 Pain in left ankle and joints of left foot: Secondary | ICD-10-CM | POA: Diagnosis not present

## 2015-06-07 DIAGNOSIS — G5751 Tarsal tunnel syndrome, right lower limb: Secondary | ICD-10-CM | POA: Diagnosis not present

## 2015-06-07 DIAGNOSIS — R635 Abnormal weight gain: Secondary | ICD-10-CM | POA: Diagnosis not present

## 2015-06-07 DIAGNOSIS — M722 Plantar fascial fibromatosis: Secondary | ICD-10-CM | POA: Diagnosis not present

## 2015-06-08 DIAGNOSIS — Z7901 Long term (current) use of anticoagulants: Secondary | ICD-10-CM | POA: Diagnosis not present

## 2015-06-14 DIAGNOSIS — F315 Bipolar disorder, current episode depressed, severe, with psychotic features: Secondary | ICD-10-CM | POA: Diagnosis not present

## 2015-07-20 ENCOUNTER — Other Ambulatory Visit: Payer: Self-pay

## 2015-07-20 DIAGNOSIS — Z1231 Encounter for screening mammogram for malignant neoplasm of breast: Secondary | ICD-10-CM

## 2015-08-04 ENCOUNTER — Ambulatory Visit (HOSPITAL_BASED_OUTPATIENT_CLINIC_OR_DEPARTMENT_OTHER): Payer: Medicare Other | Admitting: Physical Medicine & Rehabilitation

## 2015-08-04 ENCOUNTER — Encounter: Payer: Self-pay | Admitting: Physical Medicine & Rehabilitation

## 2015-08-04 ENCOUNTER — Encounter: Payer: Medicare Other | Attending: Registered Nurse

## 2015-08-04 VITALS — BP 113/57 | HR 63 | Resp 16

## 2015-08-04 DIAGNOSIS — M961 Postlaminectomy syndrome, not elsewhere classified: Secondary | ICD-10-CM | POA: Diagnosis not present

## 2015-08-04 DIAGNOSIS — Z8673 Personal history of transient ischemic attack (TIA), and cerebral infarction without residual deficits: Secondary | ICD-10-CM | POA: Insufficient documentation

## 2015-08-04 DIAGNOSIS — M25551 Pain in right hip: Secondary | ICD-10-CM | POA: Diagnosis present

## 2015-08-04 DIAGNOSIS — M47817 Spondylosis without myelopathy or radiculopathy, lumbosacral region: Secondary | ICD-10-CM

## 2015-08-04 DIAGNOSIS — M47816 Spondylosis without myelopathy or radiculopathy, lumbar region: Secondary | ICD-10-CM | POA: Insufficient documentation

## 2015-08-04 DIAGNOSIS — M7061 Trochanteric bursitis, right hip: Secondary | ICD-10-CM | POA: Diagnosis not present

## 2015-08-04 MED ORDER — ACETAMINOPHEN-CODEINE #4 300-60 MG PO TABS: 1.0000 | ORAL_TABLET | Freq: Four times a day (QID) | ORAL | Status: DC | PRN

## 2015-08-04 MED ORDER — METHOCARBAMOL 500 MG PO TABS: 500.0000 mg | ORAL_TABLET | Freq: Three times a day (TID) | ORAL | Status: DC | PRN

## 2015-08-04 NOTE — Patient Instructions (Signed)
Will need to get the okay for you to come off her Coumadin prior to the procedure and get a ProTime checked within 24 hours of the procedure

## 2015-08-04 NOTE — Progress Notes (Signed)
Subjective:    Patient ID: Emily Peck, female    DOB: [birth year], 60 y.o.   MRN: [id number]  HPI Patient complaining of right-sided low back pain. It was relieved by 100% for about a week after medial branch blocks L3 L4 L5. The pain was turned and she continues have right-sided low back pain. In addition she has right-sided hip pain. She wonders if she might have a bursitis. She is also interested in a muscle relaxer.  The patient has had no new trauma to the area. She's had no radicular discomfort.   Patient is legally blind. She works for the [employer]. She is independent with all her self-care and mobility.                          Pain Inventory Average Pain 6 Pain Right Now 6 My pain is sharp  In the last 24 hours, has pain interfered with the following? General activity 6 Relation with others 7 Enjoyment of life 7 What TIME of day is your pain at its worst? daytime, evening  Sleep (in general) Fair  Pain is worse with: walking, bending, sitting, standing and some activites Pain improves with: NA Relief from Meds: 2  Mobility use a cane how many minutes can you walk? 10 ability to climb steps?  yes do you drive?  no transfers alone Do you have any goals in this area?  no  Function Do you have any goals in this area?  no  Neuro/Psych No problems in this area  Prior Studies Any changes since last visit?  no  Physicians involved in your care Any changes since last visit?  no   Family History  Problem Relation Age of Onset  . Cancer Mother   . Diabetes Mother    Social History   Social History  . Marital Status: Single    Spouse Name: N/A  . Number of Children: N/A  . Years of Education: N/A   Social History Main Topics  . Smoking status: Never Smoker   . Smokeless tobacco: Never Used  . Alcohol Use: No  . Drug Use: No  . Sexual Activity: Not Asked   Other Topics Concern  . None   Social History Narrative   Past Surgical  History  Procedure Laterality Date  . Back surgery    . Knee arthroscopy     Past Medical History  Diagnosis Date  . Stroke ([place])   . Anxiety   . Postlaminectomy syndrome, thoracic region   . Postlaminectomy syndrome, lumbar region   . Lumbosacral spondylosis without myelopathy    BP 113/57 mmHg  Pulse 63  Resp 16  SpO2 99%  Opioid Risk Score:   Fall Risk Score:  `1  Depression screen PHQ 2/9  Depression screen [facility] 2/9 06/05/2015 05/08/2015 11/10/2014  Decreased Interest 2 2 0  Down, Depressed, Hopeless 1 1 0  PHQ - 2 Score 3 3 0  Altered sleeping - 2 -  Tired, decreased energy - 2 -  Change in appetite - 2 -  Feeling bad or failure about yourself  - 2 -  Trouble concentrating - 2 -  Moving slowly or fidgety/restless - 1 -  Suicidal thoughts - 0 -  PHQ-9 Score - 14 -  Difficult doing work/chores - Somewhat difficult -     Review of Systems  All other systems reviewed and are negative.      Objective:  Physical Exam  Constitutional: She appears well-developed and well-nourished.  HENT:  Head: Normocephalic and atraumatic.  Eyes: Conjunctivae and EOM are normal. Pupils are equal, round, and reactive to light.  Nursing note and vitals reviewed.  Patient has tenderness to palpation over the bilateral greater trochanters right greater left side however. Low back she has tenderness along the L4-L5 paraspinal area. She has pain with flexion as well as extension and lateral bending of the lumbar spine. Mild pain with twisting. Negative straight leg raising Motor strength is 5/5 bilateral hip flexor and knee extensor ankle dorsiflexor and plantar flexor       Assessment & Plan:  1. Lumbar spondylosis she had good short-term relief with lumbar medial branch blocks L3 L4 L5. She's had recurrence of her usual right greater than left-sided low back pain. She's had good results in the past with medial branch radiofrequency neurotomy last one performed in 2014. We'll  schedule for repeat this time on the right side. She is on warfarin will need to come off 5-7 days prior to the procedure and get a PT/INR drawn within 24 hours Prior to procedure. She is aware of this and did this for the last set of medial branch blocks.   2 months ago Tylenol with Codeine No. 3 one by mouth 3 times a day Have written a prescription for when necessary Robaxin 500 mg every 8 hours when necessary  Discussed with patient agrees with plan  2. Right greater trochanteric bursitis. Pain persists despite medication management and other conservative care. Patient consents to right hip injection  Trochanteric bursa injection  without ultrasound guidance  Indication Trochanteric bursitis. Exam has tenderness over the greater trochanter of the hip. Pain has not responded to conservative care such as exercise therapy and oral medications. Pain interferes with sleep or with mobility Informed consent was obtained after describing risks and benefits of the procedure with the patient these include bleeding bruising and infection. Patient has signed written consent form. Patient placed in a lateral decubitus position with the affected hip superior. Point of maximal pain was palpated marked and prepped with Betadine and entered with a 25-gauge 3 inch spinal needle to bone contact. Needle slightly withdrawn then 6mg  of betamethasone with 4 cc 1% lidocaine were injected. Patient tolerated procedure well. Post procedure instructions given.

## 2015-08-14 ENCOUNTER — Other Ambulatory Visit: Payer: Self-pay | Admitting: Internal Medicine

## 2015-08-14 ENCOUNTER — Other Ambulatory Visit: Payer: Self-pay

## 2015-08-14 ENCOUNTER — Other Ambulatory Visit: Payer: Self-pay | Admitting: Family

## 2015-08-14 DIAGNOSIS — R921 Mammographic calcification found on diagnostic imaging of breast: Secondary | ICD-10-CM

## 2015-08-21 ENCOUNTER — Other Ambulatory Visit: Payer: Self-pay | Admitting: Family

## 2015-08-21 DIAGNOSIS — R921 Mammographic calcification found on diagnostic imaging of breast: Secondary | ICD-10-CM

## 2015-09-04 ENCOUNTER — Ambulatory Visit
Admission: RE | Admit: 2015-09-04 | Discharge: 2015-09-04 | Disposition: A | Discharge status: Home / Self Care | Payer: Medicare Other | Source: Ambulatory Visit | Attending: Family | Admitting: Family

## 2015-09-04 DIAGNOSIS — R921 Mammographic calcification found on diagnostic imaging of breast: Secondary | ICD-10-CM

## 2015-09-06 DIAGNOSIS — Z7901 Long term (current) use of anticoagulants: Secondary | ICD-10-CM | POA: Diagnosis not present

## 2015-09-06 DIAGNOSIS — G629 Polyneuropathy, unspecified: Secondary | ICD-10-CM | POA: Diagnosis not present

## 2015-09-06 DIAGNOSIS — E782 Mixed hyperlipidemia: Secondary | ICD-10-CM | POA: Diagnosis not present

## 2015-09-06 DIAGNOSIS — Z79899 Other long term (current) drug therapy: Secondary | ICD-10-CM | POA: Diagnosis not present

## 2015-09-06 DIAGNOSIS — M5416 Radiculopathy, lumbar region: Secondary | ICD-10-CM | POA: Diagnosis not present

## 2015-09-13 DIAGNOSIS — F315 Bipolar disorder, current episode depressed, severe, with psychotic features: Secondary | ICD-10-CM | POA: Diagnosis not present

## 2015-09-14 DIAGNOSIS — Z7901 Long term (current) use of anticoagulants: Secondary | ICD-10-CM | POA: Diagnosis not present

## 2015-09-15 ENCOUNTER — Ambulatory Visit (HOSPITAL_BASED_OUTPATIENT_CLINIC_OR_DEPARTMENT_OTHER): Payer: Medicare Other | Admitting: Physical Medicine & Rehabilitation

## 2015-09-15 ENCOUNTER — Encounter: Payer: Self-pay | Admitting: Physical Medicine & Rehabilitation

## 2015-09-15 ENCOUNTER — Encounter: Payer: Medicare Other | Attending: Registered Nurse

## 2015-09-15 VITALS — BP 98/65 | HR 77

## 2015-09-15 DIAGNOSIS — Z8673 Personal history of transient ischemic attack (TIA), and cerebral infarction without residual deficits: Secondary | ICD-10-CM | POA: Diagnosis not present

## 2015-09-15 DIAGNOSIS — M7061 Trochanteric bursitis, right hip: Secondary | ICD-10-CM | POA: Diagnosis not present

## 2015-09-15 DIAGNOSIS — M47817 Spondylosis without myelopathy or radiculopathy, lumbosacral region: Secondary | ICD-10-CM | POA: Diagnosis not present

## 2015-09-15 DIAGNOSIS — M47816 Spondylosis without myelopathy or radiculopathy, lumbar region: Secondary | ICD-10-CM | POA: Diagnosis not present

## 2015-09-15 DIAGNOSIS — M25551 Pain in right hip: Secondary | ICD-10-CM | POA: Diagnosis present

## 2015-09-15 NOTE — Progress Notes (Signed)
  PROCEDURE RECORD [facility]   Name: DONNE WADE DOB:17-Jan-1956 MRN: [id number]  Date:09/15/2015  Physician: Alysia Penna, MD    Nurse/CMA: Estephanie Hubbs, CMA  Allergies: No Known Allergies  Consent Signed: Yes.    Is patient diabetic? No.  CBG today? n/a Pregnant: No. LMP: No LMP recorded. Patient has had a hysterectomy. (age [age])  Anticoagulants: no Anti-inflammatory: no Antibiotics: no  Procedure: radiofrequency neurotomy Position: Prone Start Time: 2:15pm      End Time:   2:42pm   Fluoro Time: 1:09  RN/CMA Bobetta Korf, CMA Urbano Milhouse, CMA    Time 2:00pm 2:45pm    BP 98/65 103/70    Pulse 77 71    Respirations 14 14    O2 Sat 95 97    S/S 6 6    Pain Level 8/10 4/10     D/C home with friend, patient A & O X 3, D/C instructions reviewed, and sits independently.

## 2015-09-15 NOTE — Progress Notes (Signed)
Tammy from [employer] called and reported INR was 0.9 done on 09/13/15

## 2015-09-15 NOTE — Patient Instructions (Signed)

## 2015-09-15 NOTE — Progress Notes (Signed)
Left L5 dorsal ramus., left L4 and left L3 medial branch radio frequency neurotomy under fluoroscopic guidance  Indication: Low back pain due to lumbar spondylosis which has been relieved on 2 occasions by greater than 50% by lumbar medial branch blocks at corresponding levels.  Informed consent was obtained after describing risks and benefits of the procedure with the patient, this includes bleeding, bruising, infection, paralysis and medication side effects. The patient wishes to proceed and has given written consent. The patient was placed in a prone position. The lumbar and sacral area was marked and prepped with Betadine. A 25-gauge 1-1/2 inch needle was inserted into the skin and subcutaneous tissue at 3 sites in one ML of 1% lidocaine was injected into each site. Then a 20-gauge 15 cm radio frequency needle with a 1 cm curved active tip was inserted targeting the left S1 SAP/sacral ala junction. Bone contact was made and confirmed with lateral imaging. Sensory stimulation at 50 Hz followed by motor stimulation at 2 Hz confirm proper needle location followed by injection of one ML of the solution containing one ML of 4 mg per mL dexamethasone and 3 mL of 1% MPF lidocaine. Then the left L5 SAP/transverse process junction was targeted. Bone contact was made and confirmed with lateral imaging. Sensory stimulation at 50 Hz followed by motor stimulation at 2 Hz confirm proper needle location followed by injection of one ML of the solution containing one ML of 4 mg per mL dexamethasone and 3 mL of 1% MPF lidocaine. Then the left L4 SAP/transverse process junction was targeted. Bone contact was made and confirmed with lateral imaging. Sensory stimulation at 50 Hz followed by motor stimulation at 2 Hz confirm proper needle location followed by injection of one ML of the solution containing one ML of 4 mg per mL dexamethasone and 3 mL of 1% MPF lidocaine. Radio frequency lesion being at [facility] for 90 seconds was  performed. Needles were removed. Post procedure instructions and vital signs were performed. Patient tolerated procedure well. Followup appointment was given.

## 2015-09-18 ENCOUNTER — Telehealth: Payer: Self-pay | Admitting: Physical Medicine & Rehabilitation

## 2015-09-18 NOTE — Telephone Encounter (Signed)
Patient is calling complaining she is still in pain form procedure last week - has been putting ice on it but the ice is not helping Is there any thing else she can or should do Please call

## 2015-09-19 NOTE — Telephone Encounter (Signed)
Pt had a Radiofrequency on 09/15/15. Please advise.

## 2015-09-19 NOTE — Telephone Encounter (Signed)
Patient said pain medications not working and would like an anti inflammatory medication.

## 2015-09-19 NOTE — Telephone Encounter (Signed)
Please advise 

## 2015-09-19 NOTE — Telephone Encounter (Signed)
Cannot prescribe nonsteroidal anti-inflammatories because of her Coumadin. She can take her Soma for muscle spasm. It will get better with time

## 2015-09-20 NOTE — Telephone Encounter (Signed)
Attempted phone call, left message for pt to call back

## 2015-09-21 NOTE — Telephone Encounter (Signed)
Contacted pt and informed...Emily KitchenMarland Peck patient acknowledged

## 2015-10-11 NOTE — Telephone Encounter (Signed)
Error

## 2015-10-16 ENCOUNTER — Ambulatory Visit: Payer: Medicare Other | Admitting: Physical Medicine & Rehabilitation

## 2015-10-19 ENCOUNTER — Ambulatory Visit (INDEPENDENT_AMBULATORY_CARE_PROVIDER_SITE_OTHER): Payer: Medicare Other | Admitting: Neurology

## 2015-10-19 ENCOUNTER — Encounter: Payer: Self-pay | Admitting: Physical Medicine & Rehabilitation

## 2015-10-19 ENCOUNTER — Encounter: Payer: Self-pay | Admitting: Neurology

## 2015-10-19 ENCOUNTER — Encounter: Payer: Medicare Other | Attending: Registered Nurse

## 2015-10-19 ENCOUNTER — Ambulatory Visit (HOSPITAL_BASED_OUTPATIENT_CLINIC_OR_DEPARTMENT_OTHER): Payer: Medicare Other | Admitting: Physical Medicine & Rehabilitation

## 2015-10-19 VITALS — BP 127/82 | HR 62 | Temp 97.5°F

## 2015-10-19 VITALS — BP 108/68 | HR 60 | Resp 20 | Ht 66.0 in | Wt 211.0 lb

## 2015-10-19 DIAGNOSIS — G609 Hereditary and idiopathic neuropathy, unspecified: Secondary | ICD-10-CM | POA: Diagnosis not present

## 2015-10-19 DIAGNOSIS — M47816 Spondylosis without myelopathy or radiculopathy, lumbar region: Secondary | ICD-10-CM | POA: Insufficient documentation

## 2015-10-19 DIAGNOSIS — M47817 Spondylosis without myelopathy or radiculopathy, lumbosacral region: Secondary | ICD-10-CM

## 2015-10-19 DIAGNOSIS — M7061 Trochanteric bursitis, right hip: Secondary | ICD-10-CM | POA: Insufficient documentation

## 2015-10-19 DIAGNOSIS — Z8673 Personal history of transient ischemic attack (TIA), and cerebral infarction without residual deficits: Secondary | ICD-10-CM | POA: Diagnosis not present

## 2015-10-19 DIAGNOSIS — G610 Guillain-Barre syndrome: Secondary | ICD-10-CM | POA: Diagnosis not present

## 2015-10-19 DIAGNOSIS — M25551 Pain in right hip: Secondary | ICD-10-CM | POA: Diagnosis present

## 2015-10-19 DIAGNOSIS — E519 Thiamine deficiency, unspecified: Secondary | ICD-10-CM

## 2015-10-19 DIAGNOSIS — R7309 Other abnormal glucose: Secondary | ICD-10-CM

## 2015-10-19 DIAGNOSIS — G589 Mononeuropathy, unspecified: Secondary | ICD-10-CM

## 2015-10-19 DIAGNOSIS — G629 Polyneuropathy, unspecified: Secondary | ICD-10-CM | POA: Diagnosis not present

## 2015-10-19 MED ORDER — ACETAMINOPHEN-CODEINE #4 300-60 MG PO TABS: 1.0000 | ORAL_TABLET | Freq: Four times a day (QID) | ORAL | 2 refills | Status: DC | PRN

## 2015-10-19 MED ORDER — GABAPENTIN 600 MG PO TABS: 1200.0000 mg | ORAL_TABLET | Freq: Three times a day (TID) | ORAL | 11 refills | Status: DC

## 2015-10-19 NOTE — Progress Notes (Signed)
  [facility]   Name: Emily Peck DOB:[birth year] MRN: [id number]  Date:10/19/2015  Physician: Alysia Penna, MD    Nurse/CMA: Shumaker RN  Allergies: No Known Allergies  Consent Signed: Yes.    Is patient diabetic? No.  CBG today?  Pregnant: No. LMP: No LMP recorded. Patient has had a hysterectomy. (age [age])  Anticoagulants: yes (on hold since 10/12/15) Anti-inflammatory: no Antibiotics: no  Procedure: R L3-4-5 Radiofrequency neurotomy Position: Prone Start Time: 12:26 End Time: 12:45 Fluoro Time: 42 sec  RN/CMA Biomedical engineer    Time 11:58 12:50    BP 127/82 147/77    Pulse 62 66    Respirations 16 16    O2 Sat 96 96    S/S 6 6    Pain Level 5/10      D/C home with SCAT patient A & O X 3, D/C instructions reviewed, and sits independently.

## 2015-10-19 NOTE — Progress Notes (Signed)
[facility]    Provider:  Dr Jaynee Eagles Referring Provider: Glendale Chard, MD Primary Care Physician:  Nanci Pina, FNP  CC:  Neuropathy  HPI:  Emily Peck is a 60 y.o. female here as a referral from Dr. Baird Cancer for neuropathy in the feet. PMHx of hyperlipidemia, chronic lumbar radiculopathy, polyneuropathy, obesity, thoracic compression fractures T10-T11 s/pT12-L1 fusion, depression, glaucoma, retinal detachment, macular degeneration and poor vision.. She has had neuropathy in the feet for years. Progressively worsening, continuous. Pins and needles, burning in the feet, it used to be under the bottom now it on the top also. She denies diabetes or using any medications that can cause neuropathy she has never been exposed to toxins that can cause neuropathy. Mother was diabetic and had neuropathy. Associated ith cramps in the feet. The burning is the worst. Worse at night. The gabapentin helps. She takes 600mg  4x a day. Continous all day. Unknown inciting events. No medications that can induce neuropathy. No alcohol use. She takes 600mg  neurontin 4x a day.    Reviewed notes, labs and imaging from outside physicians, which showed:   Patient has been seeing Dr. Letta Pate at [facility] for chronic low back pain for  lumbar medial branch blocks L3 L4 L5 as well as T 12 L1-L2  and L5 dorsal ramus injections, Left-sided radiofrequency procedures at L3 L4 L5 in 2013 and  2014. She has back spasms. She has a past medical history of neuropathy and is on gabapentin 600 mg 4 times a day. Neuropathy regarded is idiopathic. She has chronic low back and neck pain. Denies any other associated symptoms. She does say is difficult to walk due to the pain.   FINDINGS: CT HEAD FINDINGS  Ventricles are normal in size and configuration. No parenchymal masses or mass effect. No evidence of an infarct. There are no extra-axial masses or abnormal fluid collections.  No intracranial  hemorrhage.  Stable right prosthetic globe. Elongated left globe, also unchanged.  Visualized sinuses and mastoid air cells are clear. No skull Fracture.  Personally reviewed images and agree with the following:  CT CERVICAL SPINE FINDINGS  No fracture. No spondylolisthesis. Disc spaces are well preserved. No significant stenosis. He year soft tissues are unremarkable. Lung apices are clear new  IMPRESSION: HEAD CT:  No acute intracranial abnormalities.  No skull fracture.  CERVICAL CT:  No fracture or acute finding.   CT Lumbar:  T12-L1:  Unremarkable.  L1-L2: Unremarkable.  L2-L3: Unremarkable.  L3-L4: Mild right facet degenerative change. No significant disc bulging or disc protrusion. No significant stenosis.  L4-L5: Mild facet degenerative change. Mild right neural foraminal narrowing.  L5-S1:  Unremarkable.  Surrounding soft tissues are unremarkable.  IMPRESSION: 1. No acute findings. Specifically, no acute fracture or spondylolisthesis. No disc herniation. 2. Stable changes from previous posterior thoracolumbar fusion. 3. Mild degenerative changes.   Labs 09/06/2015 include CMP with creatinine 0.61 and MCV of 98, triglycerides 195, vitamin B12 789  Review of Systems: Patient complains of symptoms per HPI as well as the following symptoms: No falls, no chest pain or shortness of breath. Pertinent negatives per HPI. All others negative.   Social History   Social History  . Marital status: Single    Spouse name: N/A  . Number of children: N/A  . Years of education: N/A   Occupational History  . Not on file.   Social History Main Topics  . Smoking status: Never Smoker  . Smokeless tobacco: Never Used  .  Alcohol use No  . Drug use: No  . Sexual activity: Not on file   Other Topics Concern  . Not on file   Social History Narrative  . No narrative on file    Family History  Problem Relation Age of Onset  . Cancer Mother   .  Diabetes Mother     Past Medical History:  Diagnosis Date  . Anxiety   . Lumbosacral spondylosis without myelopathy   . Postlaminectomy syndrome, lumbar region   . Postlaminectomy syndrome, thoracic region   . Stroke [facility])     Past Surgical History:  Procedure Laterality Date  . BACK SURGERY    . KNEE ARTHROSCOPY      Current Outpatient Prescriptions  Medication Sig Dispense Refill  . acetaminophen-codeine (TYLENOL #4) 300-60 MG tablet Take 1 tablet by mouth 4 (four) times daily as needed. 120 tablet 2  . carisoprodol (SOMA) 350 MG tablet Take 1 tablet (350 mg total) by mouth 3 (three) times daily as needed for muscle spasms. 45 tablet 0  . escitalopram (LEXAPRO) 20 MG tablet Take 30 mg by mouth daily.  0  . gabapentin (NEURONTIN) 600 MG tablet Take 2 tablets (1,200 mg total) by mouth 3 (three) times daily. 180 tablet 11  . meclizine (ANTIVERT) 12.5 MG tablet Take 1 tablet by mouth 3 (three) times daily.  0  . methocarbamol (ROBAXIN) 500 MG tablet Take 1 tablet (500 mg total) by mouth every 8 (eight) hours as needed for muscle spasms. 45 tablet 1  . omeprazole (PRILOSEC) 20 MG capsule Take 20 mg by mouth daily as needed.     . potassium chloride (K-DUR) 10 MEQ tablet Take 10 mEq by mouth 2 (two) times daily.    Marland Kitchen topiramate (TOPAMAX) 200 MG tablet Take 200 mg by mouth daily.  0  . traZODone (DESYREL) 150 MG tablet Take 150 mg by mouth at bedtime.  0  . Vitamin D, Ergocalciferol, (DRISDOL) 50000 units CAPS capsule Take 50,000 Units by mouth every 7 (seven) days.    Marland Kitchen warfarin (COUMADIN) 6 MG tablet Take 6-9 mg by mouth daily. Reported on 06/05/2015     No current facility-administered medications for this visit.     Allergies as of 10/19/2015  . (No Known Allergies)    Vitals: BP 108/68   Pulse 60   Resp 20   Ht 5\' 6"  (1.676 m)   Wt 211 lb (95.7 kg)   BMI 34.06 kg/m  Last Weight:  Wt Readings from Last 1 Encounters:  10/19/15 211 lb (95.7 kg)   Last Height:   Ht  Readings from Last 1 Encounters:  10/19/15 5\' 6"  (1.676 m)    Physical exam: Exam: Gen: NAD, conversant, well nourised, obese, well groomed                     CV: RRR, no MRG. No Carotid Bruits. No peripheral edema, warm, nontender Eyes: Conjunctivae clear without exudates or hemorrhage  Neuro: Detailed Neurologic Exam  Speech:    Speech is normal; fluent and spontaneous with normal comprehension.  Cognition:    The patient is oriented to person, place, and time;     recent and remote memory intact;     language fluent;     normal attention, concentration,     fund of knowledge Cranial Nerves:    Prosthesis right eye per patient (glaucoma, retinal detachment, macular degeneration), left pupil reactive, Could not visualize left fundus due to small pupil  attempted, Visual fields are impaired in the left eye and patient is blind in the right eye. Extraocular movements are largely intact in the left eye with impairment of elevation. Trigeminal sensation is intact and the muscles of mastication are normal. Left ptosis otherwise symmetric.. The palate elevates in the midline. Hearing intact. Voice is normal. Shoulder shrug is normal. The tongue has normal motion without fasciculations.   Coordination:    finger to nose and heel to shin without dysmetria  Gait:    Wide-based antalgic   Motor Observation:    No asymmetry, no atrophy, and no involuntary movements noted. Tone:    Normal muscle tone.    Posture:    Posture is normal. normal erect    Strength: poor efforst throughtout and giveway due to pain but no focal weakness         Sensation: dec pp, temp to mid calf, dysesthesias, intact vibration and proprioception     Reflex Exam:  DTR's: Absent AJs  Toes:    The toes are equivocal bilaterally.   Clonus:    Clonus is absent.      Assessment/Plan:  60 y.o. female here as a referral from Dr. Baird Cancer for neuropathy in the feet. PMHx of hyperlipidemia, chronic lumbar  radiculopathy, polyneuropathy, obesity, thoracic compression fractures T10-T11 s/pT12-L1 fusion, depression, glaucoma, retinal detachment, macular degeneration and poor vision.. She has had neuropathy in the feet for years. Progressively worsening, continuous. Exam significant for distal sensory deficits and a small fiber distribution, intact vibration and proprioception which are more large fiber.  Discussed falls and fall risk. Keep lights on at home especially at night. He is walking aids if needed. May consider physical therapy in the future.  We'll perform a serum neuropathy screen. B12 and TSH already drawn and normal as per PCP notes.  Patient declines trying Lyrica in the future. Will increase gabapentin to 1200 mg 3 times a day. Discussed side effects as per patient instructions. Watch for sedation.    Sarina Ill, MD  [facility], [facility] [address] [place], [place] [address]  Phone [phone] Fax [phone]

## 2015-10-19 NOTE — Patient Instructions (Signed)
May resume warfarin tonite

## 2015-10-19 NOTE — Patient Instructions (Signed)
Remember to drink plenty of fluid, eat healthy meals and do not skip any meals. Try to eat protein with a every meal and eat a healthy snack such as fruit or nuts in between meals. Try to keep a regular sleep-wake schedule and try to exercise daily, particularly in the form of walking, 20-30 minutes a day, if you can.   As far as your medications are concerned, I would like to suggest Increase neurontin to 1200mg  3x a day  As far as diagnostic testing: Labs  I would like to see you back in 6 months, sooner if we need to. Please call us with any interim questions, concerns, problems, updates or refill requests.   Our phone number is [phone]. We also have an after hours call service for urgent matters and there is a physician on-call for urgent questions. For any emergencies you know to call 911 or go to the nearest emergency room

## 2015-10-19 NOTE — Progress Notes (Signed)
RightL5 dorsal ramus., Right L4 and Right L3 medial branch radio frequency neurotomy under fluoroscopic guidance   Indication: Low back pain due to lumbar spondylosis which has been relieved on 2 occasions by greater than 50% by lumbar medial branch blocks at corresponding levels.  Informed consent was obtained after describing risks and benefits of the procedure with the patient, this includes bleeding, bruising, infection, paralysis and medication side effects. The patient wishes to proceed and has given written consent. The patient was placed in a prone position. The lumbar and sacral area was marked and prepped with Betadine. A 25-gauge 1-1/2 inch needle was inserted into the skin and subcutaneous tissue at 3 sites in one ML of 1% lidocaine was injected into each site. Then a 20-gauge 15 cm radio frequency needle with a 1 cm curved active tip was inserted targeting the Right S1 SAP/sacral ala junction. Bone contact was made and confirmed with lateral imaging. Sensory stimulation at 50 Hz followed by motor stimulation at 2 Hz confirm proper needle location followed by injection of one ML of the solution containing one ML of 4 mg per mL dexamethasone and 3 mL of 1% MPF lidocaine. Then the Right L5 SAP/transverse process junction was targeted. Bone contact was made and confirmed with lateral imaging. Sensory stimulation at 50 Hz followed by motor stimulation at 2 Hz confirm proper needle location followed by injection of one ML of the solution containing one ML of 4 mg per mL dexamethasone and 3 mL of 1% MPF lidocaine. Then the Right L4 SAP/transverse process junction was targeted. Bone contact was made and confirmed with lateral imaging. Sensory stimulation at 50 Hz followed by motor stimulation at 2 Hz confirm proper needle location followed by injection of one ML of the solution containing one ML of 4 mg per mL dexamethasone and 3 mL of 1% MPF lidocaine. Radio frequency lesion being at 80C for 90 seconds  was performed. Needles were removed. Post procedure instructions and vital signs were performed. Patient tolerated procedure well. Followup appointment was given. 

## 2015-10-23 ENCOUNTER — Telehealth: Payer: Self-pay | Admitting: *Deleted

## 2015-10-23 DIAGNOSIS — R0602 Shortness of breath: Secondary | ICD-10-CM | POA: Diagnosis not present

## 2015-10-23 DIAGNOSIS — Z8673 Personal history of transient ischemic attack (TIA), and cerebral infarction without residual deficits: Secondary | ICD-10-CM | POA: Diagnosis not present

## 2015-10-23 DIAGNOSIS — R600 Localized edema: Secondary | ICD-10-CM | POA: Diagnosis not present

## 2015-10-23 DIAGNOSIS — R0789 Other chest pain: Secondary | ICD-10-CM | POA: Diagnosis not present

## 2015-10-23 LAB — MULTIPLE MYELOMA PANEL, SERUM
ALBUMIN SERPL ELPH-MCNC: 3.7 g/dL (ref 2.9–4.4)
ALPHA 1: 0.2 g/dL (ref 0.0–0.4)
Albumin/Glob SerPl: 1.1 (ref 0.7–1.7)
Alpha2 Glob SerPl Elph-Mcnc: 0.7 g/dL (ref 0.4–1.0)
B-Globulin SerPl Elph-Mcnc: 1.4 g/dL — ABNORMAL HIGH (ref 0.7–1.3)
GLOBULIN, TOTAL: 3.5 g/dL (ref 2.2–3.9)
Gamma Glob SerPl Elph-Mcnc: 1.2 g/dL (ref 0.4–1.8)
IGA/IMMUNOGLOBULIN A, SERUM: 443 mg/dL — AB (ref 87–352)
IGM (IMMUNOGLOBULIN M), SRM: 57 mg/dL (ref 26–217)
IgG (Immunoglobin G), Serum: 1218 mg/dL (ref 700–1600)
TOTAL PROTEIN: 7.2 g/dL (ref 6.0–8.5)

## 2015-10-23 LAB — B. BURGDORFI ANTIBODIES: Lyme IgG/IgM Ab: <0.91 {ISR} (ref 0.00–0.90)

## 2015-10-23 LAB — HEMOGLOBIN A1C
ESTIMATED AVERAGE GLUCOSE: 111 mg/dL
HEMOGLOBIN A1C: 5.5 % (ref 4.8–5.6)

## 2015-10-23 LAB — HEPATITIS C ANTIBODY

## 2015-10-23 LAB — RHEUMATOID FACTOR: Rhuematoid fact SerPl-aCnc: <10 IU/mL (ref 0.0–13.9)

## 2015-10-23 LAB — HEAVY METALS, BLOOD
ARSENIC: 9 ug/L (ref 2–23)
LEAD, BLOOD: NOT DETECTED ug/dL (ref 0–19)
MERCURY: 1.7 ug/L (ref 0.0–14.9)

## 2015-10-23 LAB — ANA W/REFLEX: ANA: NEGATIVE

## 2015-10-23 LAB — SJOGREN'S SYNDROME ANTIBODS(SSA + SSB)
ENA SSA (RO) Ab: <0.2 AI (ref 0.0–0.9)
ENA SSB (LA) Ab: <0.2 AI (ref 0.0–0.9)

## 2015-10-23 LAB — VITAMIN B1: Thiamine: 109.8 nmol/L (ref 66.5–200.0)

## 2015-10-23 LAB — RPR: RPR: NONREACTIVE

## 2015-10-23 NOTE — Telephone Encounter (Signed)
LVM for pt about unremarkable labs per Dr Jaynee Eagles. Gave [facility] phone number if she has further questions.

## 2015-10-23 NOTE — Telephone Encounter (Signed)
-----   Message from Melvenia Beam, MD sent at 10/23/2015  4:57 PM EDT ----- Labs unremarkable thanks

## 2015-11-10 DIAGNOSIS — R0789 Other chest pain: Secondary | ICD-10-CM | POA: Diagnosis not present

## 2015-11-10 DIAGNOSIS — R0602 Shortness of breath: Secondary | ICD-10-CM | POA: Diagnosis not present

## 2015-11-23 DIAGNOSIS — R0789 Other chest pain: Secondary | ICD-10-CM | POA: Diagnosis not present

## 2015-11-23 DIAGNOSIS — R0602 Shortness of breath: Secondary | ICD-10-CM | POA: Diagnosis not present

## 2015-11-27 DIAGNOSIS — M25562 Pain in left knee: Secondary | ICD-10-CM | POA: Diagnosis not present

## 2015-11-27 DIAGNOSIS — M25561 Pain in right knee: Secondary | ICD-10-CM | POA: Diagnosis not present

## 2015-12-18 DIAGNOSIS — I080 Rheumatic disorders of both mitral and aortic valves: Secondary | ICD-10-CM | POA: Diagnosis not present

## 2015-12-18 DIAGNOSIS — R0602 Shortness of breath: Secondary | ICD-10-CM | POA: Diagnosis not present

## 2015-12-18 DIAGNOSIS — R0789 Other chest pain: Secondary | ICD-10-CM | POA: Diagnosis not present

## 2015-12-18 DIAGNOSIS — R600 Localized edema: Secondary | ICD-10-CM | POA: Diagnosis not present

## 2015-12-20 ENCOUNTER — Other Ambulatory Visit: Payer: Self-pay | Admitting: Cardiology

## 2015-12-20 DIAGNOSIS — R609 Edema, unspecified: Secondary | ICD-10-CM

## 2015-12-20 DIAGNOSIS — R0602 Shortness of breath: Secondary | ICD-10-CM

## 2015-12-20 DIAGNOSIS — R600 Localized edema: Secondary | ICD-10-CM

## 2015-12-21 DIAGNOSIS — F315 Bipolar disorder, current episode depressed, severe, with psychotic features: Secondary | ICD-10-CM | POA: Diagnosis not present

## 2015-12-26 ENCOUNTER — Other Ambulatory Visit: Payer: Self-pay | Admitting: Orthopaedic Surgery

## 2015-12-29 ENCOUNTER — Ambulatory Visit
Admission: RE | Admit: 2015-12-29 | Discharge: 2015-12-29 | Disposition: A | Discharge status: Home / Self Care | Payer: Medicare Other | Source: Ambulatory Visit | Attending: Cardiology | Admitting: Cardiology

## 2015-12-29 ENCOUNTER — Encounter (HOSPITAL_COMMUNITY)
Admission: RE | Admit: 2015-12-29 | Discharge: 2015-12-29 | Disposition: A | Discharge status: Home / Self Care | Payer: Medicare Other | Source: Ambulatory Visit | Attending: Orthopaedic Surgery | Admitting: Orthopaedic Surgery

## 2015-12-29 ENCOUNTER — Ambulatory Visit (HOSPITAL_COMMUNITY)
Admission: RE | Admit: 2015-12-29 | Discharge: 2015-12-29 | Disposition: A | Discharge status: Home / Self Care | Payer: Medicare Other | Source: Ambulatory Visit | Attending: Orthopaedic Surgery | Admitting: Orthopaedic Surgery

## 2015-12-29 ENCOUNTER — Encounter (HOSPITAL_COMMUNITY): Payer: Self-pay

## 2015-12-29 DIAGNOSIS — Z01812 Encounter for preprocedural laboratory examination: Secondary | ICD-10-CM | POA: Insufficient documentation

## 2015-12-29 DIAGNOSIS — R600 Localized edema: Secondary | ICD-10-CM | POA: Diagnosis not present

## 2015-12-29 DIAGNOSIS — Z01818 Encounter for other preprocedural examination: Secondary | ICD-10-CM | POA: Insufficient documentation

## 2015-12-29 DIAGNOSIS — R609 Edema, unspecified: Secondary | ICD-10-CM

## 2015-12-29 DIAGNOSIS — R0602 Shortness of breath: Secondary | ICD-10-CM | POA: Diagnosis not present

## 2015-12-29 DIAGNOSIS — Z9889 Other specified postprocedural states: Secondary | ICD-10-CM | POA: Diagnosis not present

## 2015-12-29 DIAGNOSIS — I700 Atherosclerosis of aorta: Secondary | ICD-10-CM | POA: Diagnosis not present

## 2015-12-29 HISTORY — DX: Dyspnea, unspecified: R06.00

## 2015-12-29 HISTORY — DX: Headache, unspecified: R51.9

## 2015-12-29 HISTORY — DX: Depression, unspecified: F32.A

## 2015-12-29 HISTORY — DX: Major depressive disorder, single episode, unspecified: F32.9

## 2015-12-29 HISTORY — DX: Polyneuropathy, unspecified: G62.9

## 2015-12-29 HISTORY — DX: Coagulation defect, unspecified: D68.9

## 2015-12-29 HISTORY — DX: Gastro-esophageal reflux disease without esophagitis: K21.9

## 2015-12-29 HISTORY — DX: Headache: R51

## 2015-12-29 LAB — URINALYSIS, ROUTINE W REFLEX MICROSCOPIC
BILIRUBIN URINE: NEGATIVE
Glucose, UA: NEGATIVE mg/dL
Ketones, ur: NEGATIVE mg/dL
Leukocytes, UA: NEGATIVE
Nitrite: NEGATIVE
PROTEIN: NEGATIVE mg/dL
Specific Gravity, Urine: 1.022 (ref 1.005–1.030)
pH: 5.5 (ref 5.0–8.0)

## 2015-12-29 LAB — CBC WITH DIFFERENTIAL/PLATELET
BASOS PCT: 1 %
Basophils Absolute: 0 10*3/uL (ref 0.0–0.1)
EOS ABS: 0.3 10*3/uL (ref 0.0–0.7)
Eosinophils Relative: 4 %
HCT: 41.5 % (ref 36.0–46.0)
HEMOGLOBIN: 13.7 g/dL (ref 12.0–15.0)
Lymphocytes Relative: 36 %
Lymphs Abs: 3 10*3/uL (ref 0.7–4.0)
MCH: 32.3 pg (ref 26.0–34.0)
MCHC: 33 g/dL (ref 30.0–36.0)
MCV: 97.9 fL (ref 78.0–100.0)
Monocytes Absolute: 0.5 10*3/uL (ref 0.1–1.0)
Monocytes Relative: 6 %
NEUTROS PCT: 53 %
Neutro Abs: 4.4 10*3/uL (ref 1.7–7.7)
Platelets: 253 10*3/uL (ref 150–400)
RBC: 4.24 MIL/uL (ref 3.87–5.11)
RDW: 13 % (ref 11.5–15.5)
WBC: 8.2 10*3/uL (ref 4.0–10.5)

## 2015-12-29 LAB — BASIC METABOLIC PANEL
Anion gap: 7 (ref 5–15)
BUN: 7 mg/dL (ref 6–20)
CALCIUM: 9.5 mg/dL (ref 8.9–10.3)
CHLORIDE: 109 mmol/L (ref 101–111)
CO2: 22 mmol/L (ref 22–32)
CREATININE: 0.81 mg/dL (ref 0.44–1.00)
GFR calc non Af Amer: >60 mL/min (ref >60)
Glucose, Bld: 103 mg/dL — ABNORMAL HIGH (ref 65–99)
Potassium: 3.3 mmol/L — ABNORMAL LOW (ref 3.5–5.1)
Sodium: 138 mmol/L (ref 135–145)

## 2015-12-29 LAB — SURGICAL PCR SCREEN
MRSA, PCR: POSITIVE — AB
Staphylococcus aureus: POSITIVE — AB

## 2015-12-29 LAB — TYPE AND SCREEN
ABO/RH(D): O POS
ANTIBODY SCREEN: NEGATIVE

## 2015-12-29 LAB — URINE MICROSCOPIC-ADD ON: Bacteria, UA: NONE SEEN

## 2015-12-29 MED ORDER — IOPAMIDOL (ISOVUE-370) INJECTION 76%
100.0000 mL | Freq: Once | INTRAVENOUS | Status: AC | PRN
  Administered 2015-12-29: 100 mL via INTRAVENOUS

## 2015-12-29 NOTE — Progress Notes (Signed)
PCP - [facility] Cardiologist - [place]   Chest x-ray - 12/29/15 EKG - 04/10/15 Stress Test -requesting  ECHO - requesting Cardiac Cath - requesting  Patient stated that she had a stroke in 2004 and has had testing for a cardiac cath.   Patient also stated that she has a blood clotting disorder and is taking coumadin for that.  Patient was instructed to stop taking coumadin 3 days prior to surgery   Will send to anesthesia for review of chart relating to cardiac exams and clotting disorder   Patient denies shortness of breath, fever, cough and chest pain at PAT appointment

## 2015-12-29 NOTE — Progress Notes (Signed)
Mupirocin Ointment Rx called into [facility] on [address] for positive PCR of MRSA and Staph. Pt notified and voiced understanding.

## 2015-12-29 NOTE — Pre-Procedure Instructions (Signed)
Dabria L Petrelli  12/29/2015      [id number], [place] [address] Phone: [phone] Fax: [phone]    Your procedure is scheduled on November 7  Report to [place] at [place].  Call this number if you have problems the morning of surgery:  [phone]   Remember:  Do not eat food or drink liquids after midnight.   Take these medicines the morning of surgery with A SIP OF WATER gabapentin (NEURONTIN),   7 days prior to surgery STOP taking any Aspirin, Aleve, Naproxen, Ibuprofen, Motrin, Advil, Goody's, BC's, all herbal medications, fish oil, and all vitamins     Do not wear jewelry, make-up or nail polish.  Do not wear lotions, powders, or perfumes, or deoderant.  Do not shave 48 hours prior to surgery.   Do not bring valuables to the hospital.  [facility] is not responsible for any belongings or valuables.  Contacts, dentures or bridgework may not be worn into surgery.  Leave your suitcase in the car.  After surgery it may be brought to your room.  For patients admitted to the hospital, discharge time will be determined by your treatment team.  Patients discharged the day of surgery will not be allowed to drive home.    Special instructions:   [facility]- Preparing For Surgery  Before surgery, you can play an important role. Because skin is not sterile, your skin needs to be as free of germs as possible. You can reduce the number of germs on your skin by washing with CHG (chlorahexidine gluconate) Soap before surgery.  CHG is an antiseptic cleaner which kills germs and bonds with the skin to continue killing germs even after washing.  Please do not use if you have an allergy to CHG or antibacterial soaps. If your skin becomes reddened/irritated stop using the CHG.  Do not shave (including legs and underarms) for at least 48 hours prior to first CHG shower.  It is OK to shave your face.  Please follow these instructions carefully.   1. Shower the NIGHT BEFORE SURGERY and the MORNING OF SURGERY with CHG.   2. If you chose to wash your hair, wash your hair first as usual with your normal shampoo.  3. After you shampoo, rinse your hair and body thoroughly to remove the shampoo.  4. Use CHG as you would any other liquid soap. You can apply CHG directly to the skin and wash gently with a scrungie or a clean washcloth.   5. Apply the CHG Soap to your body ONLY FROM THE NECK DOWN.  Do not use on open wounds or open sores. Avoid contact with your eyes, ears, mouth and genitals (private parts). Wash genitals (private parts) with your normal soap.  6. Wash thoroughly, paying special attention to the area where your surgery will be performed.  7. Thoroughly rinse your body with warm water from the neck down.  8. DO NOT shower/wash with your normal soap after using and rinsing off the CHG Soap.  9. Pat yourself dry with a CLEAN TOWEL.   10. Wear CLEAN PAJAMAS   11. Place CLEAN SHEETS on your bed the night of your first shower and DO NOT SLEEP WITH PETS.    Day of Surgery: Do not apply any deodorants/lotions. Please wear clean clothes to the hospital/surgery center.       Please read over the following  fact sheets that you were given.

## 2016-01-01 NOTE — Progress Notes (Addendum)
Anesthesia Chart Review:  Pt is a 60 year old female scheduled for R total knee arthroplasty on 01/09/2016 with Melrose Nakayama, MD.   Cardiologist is Vear Clock, MD who has cleared pt for surgery.   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 Never smoker. BMI 35  Medications include: lasix, potassium, coumadin. Pt to stop coumadin 3 days before surgery.   Preoperative labs reviewed.  PT/PTT will be obtained DOS.   CXR 12/29/15:  - Hypoinflation without acute cardiopulmonary disease. - Aortic atherosclerosis. - Stable postsurgical change of the thoracolumbar spine with fusion hardware intact.  EKG 04/10/15: Sinus rhythm. Low voltage, precordial leads. LVH by voltage  Nuclear stress test 07/11/09: Lexiscan Myoview with no chest pain and no diagnostic electrocardiographic changes.  The scintigraphic results show apical thinning but no ischemia.  The gated ejection fraction was 66% and wall motion was normal.  Echo 11/23/15:  1. LV cavity is normal in size. Normal global wall motion. Normal diastolic filling pattern. EF 61%. 2. LA cavity is moderately dilated at 4.5 cm. 3. Mild aortic regurgitation. 4. Mild mitral regurgitation. 5. Moderate tricuspid regurgitation. Mild pulmonary hypertension. Pulmonary artery systolic pressure is 39 mmHg. 6 mild pulmonic regurgitation.  Nuclear stress test 11/10/2015: 1. Mild breast attenuation in inferior wall without ischemia or scar. Overall LV systolic function normal without regional wall motion abnormalities. EF 68%. 2. This is low risk study.  If no changes, I anticipate pt can proceed with surgery as scheduled.   Willeen Cass, FNP-BC [facility]  Short Stay Surgical Center/Anesthesiology Phone: [phone] 01/02/2016 1:27 PM

## 2016-01-02 ENCOUNTER — Telehealth: Payer: Self-pay | Admitting: *Deleted

## 2016-01-02 NOTE — Telephone Encounter (Signed)
Emily Peck called to let us know that she will not be back for a couple of months because she is having a total knee replacement and will be under care of her ortho Psychologist, sport and exercise.

## 2016-01-06 NOTE — H&P (Signed)
TOTAL KNEE ADMISSION H&P  Patient is being admitted for right total knee arthroplasty.  Subjective:  Chief Complaint:right knee pain.  HPI: Emily Peck, 60 y.o. female, has a history of pain and functional disability in the right knee due to arthritis and has failed non-surgical conservative treatments for greater than 12 weeks to includeNSAID's and/or analgesics, corticosteriod injections, viscosupplementation injections, flexibility and strengthening excercises, supervised PT with diminished ADL's post treatment, use of assistive devices, weight reduction as appropriate and activity modification.  Onset of symptoms was gradual, starting 5 years ago with gradually worsening course since that time. The patient noted prior procedures on the knee to include  arthroscopy on the right knee(s).  Patient currently rates pain in the right knee(s) at 10 out of 10 with activity. Patient has night pain, worsening of pain with activity and weight bearing, pain that interferes with activities of daily living, crepitus and joint swelling.  Patient has evidence of subchondral cysts, subchondral sclerosis, periarticular osteophytes and joint space narrowing by imaging studies. There is no active infection.  Patient Active Problem List   Diagnosis Date Noted  . Peripheral polyneuropathy ([place]) 10/19/2015  . Knee pain, chronic 06/11/2013  . Thoracic postlaminectomy syndrome 06/11/2013  . Pes planus of both feet 06/11/2013  . Trochanteric bursitis of both hips 11/06/2012  . Lumbosacral spondylosis without myelopathy 05/09/2011  . DEPRESSION 07/24/2006  . CARPAL TUNNEL SYNDROME, RIGHT 07/24/2006  . MYOPIA, 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  . DEGENERATIVE JOINT DISEASE 07/24/2006  . DISORDER, LUMBAR DISC W/MYELOPATHY 07/24/2006  . 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 AVULSION, EYE 12/23/2005   Past Medical History:  Diagnosis Date  . Anxiety   . Clotting disorder ([place])   . Depression   . Dyspnea    . GERD (gastroesophageal reflux disease)   . Headache   . Lumbosacral spondylosis without myelopathy   . Neuropathy ([place])   . Postlaminectomy syndrome, lumbar region   . Postlaminectomy syndrome, thoracic region   . Stroke [facility])     Past Surgical History:  Procedure Laterality Date  . ABDOMINAL HYSTERECTOMY    . BACK SURGERY    . EYE SURGERY     eye removed right   . KNEE ARTHROSCOPY      No prescriptions prior to admission.   No Known Allergies  Social History  Substance Use Topics  . Smoking status: Never Smoker  . Smokeless tobacco: Never Used  . Alcohol use No    Family History  Problem Relation Age of Onset  . Cancer Mother   . Diabetes Mother      Review of Systems  Musculoskeletal: Positive for joint pain.       Right knee  All other systems reviewed and are negative.   Objective:  Physical Exam  Constitutional: She is oriented to person, place, and time. She appears well-developed and well-nourished.  HENT:  Head: Normocephalic and atraumatic.  Eyes: Pupils are equal, round, and reactive to light.  Neck: Normal range of motion.  Cardiovascular: Normal rate.   Respiratory: Effort normal.  GI: Soft.  Musculoskeletal:  Right knee has a mild valgus deformity.  Her motion is about 0-120.  She has lateral and medial joint line pain and crepitation.  Her skin is benign.  Hip motion is full and straight leg raise is negative.  Sensation and motor function are intact in her feet with palpable pulses on both sides.    Neurological: She is alert and oriented to person, place, and time.  Skin: Skin is warm and dry.  Psychiatric: She has a normal mood and affect. Her behavior is normal. Judgment and thought content normal.    Vital signs in last 24 hours:    Labs:   Estimated body mass index is 34.93 kg/m as calculated from the following:   Height as of 12/29/15: 5\' 5"  (1.651 m).   Weight as of 12/29/15: 95.2 kg (209 lb 14.4 oz).   Imaging  Review Plain radiographs demonstrate severe degenerative joint disease of the right knee(s). The overall alignment isneutral. The bone quality appears to be good for age and reported activity level.  Assessment/Plan:  End stage primary arthritis, right knee   The patient history, physical examination, clinical judgment of the provider and imaging studies are consistent with end stage degenerative joint disease of the right knee(s) and total knee arthroplasty is deemed medically necessary. The treatment options including medical management, injection therapy arthroscopy and arthroplasty were discussed at length. The risks and benefits of total knee arthroplasty were presented and reviewed. The risks due to aseptic loosening, infection, stiffness, patella tracking problems, thromboembolic complications and other imponderables were discussed. The patient acknowledged the explanation, agreed to proceed with the plan and consent was signed. Patient is being admitted for inpatient treatment for surgery, pain control, PT, OT, prophylactic antibiotics, VTE prophylaxis, progressive ambulation and ADL's and discharge planning. The patient is planning to be discharged home with home health services

## 2016-01-09 ENCOUNTER — Inpatient Hospital Stay (HOSPITAL_COMMUNITY): Payer: Medicare Other | Admitting: Vascular Surgery

## 2016-01-09 ENCOUNTER — Inpatient Hospital Stay (HOSPITAL_COMMUNITY)
Admission: RE | Admit: 2016-01-09 | Discharge: 2016-01-12 | DRG: 470 | Disposition: A | Discharge status: Home Health | Payer: Medicare Other | Source: Ambulatory Visit | Attending: Orthopaedic Surgery | Admitting: Orthopaedic Surgery

## 2016-01-09 ENCOUNTER — Inpatient Hospital Stay (HOSPITAL_COMMUNITY): Payer: Medicare Other | Admitting: Anesthesiology

## 2016-01-09 ENCOUNTER — Encounter (HOSPITAL_COMMUNITY)
Admission: RE | Disposition: A | Discharge status: Home Health | Payer: Self-pay | Source: Ambulatory Visit | Attending: Orthopaedic Surgery

## 2016-01-09 ENCOUNTER — Encounter (HOSPITAL_COMMUNITY): Payer: Self-pay | Admitting: *Deleted

## 2016-01-09 DIAGNOSIS — Z8673 Personal history of transient ischemic attack (TIA), and cerebral infarction without residual deficits: Secondary | ICD-10-CM | POA: Diagnosis not present

## 2016-01-09 DIAGNOSIS — F329 Major depressive disorder, single episode, unspecified: Secondary | ICD-10-CM | POA: Diagnosis present

## 2016-01-09 DIAGNOSIS — Z9001 Acquired absence of eye: Secondary | ICD-10-CM

## 2016-01-09 DIAGNOSIS — M25561 Pain in right knee: Secondary | ICD-10-CM | POA: Diagnosis not present

## 2016-01-09 DIAGNOSIS — G629 Polyneuropathy, unspecified: Secondary | ICD-10-CM | POA: Diagnosis not present

## 2016-01-09 DIAGNOSIS — R262 Difficulty in walking, not elsewhere classified: Secondary | ICD-10-CM

## 2016-01-09 DIAGNOSIS — G5601 Carpal tunnel syndrome, right upper limb: Secondary | ICD-10-CM | POA: Diagnosis not present

## 2016-01-09 DIAGNOSIS — G8918 Other acute postprocedural pain: Secondary | ICD-10-CM | POA: Diagnosis not present

## 2016-01-09 DIAGNOSIS — M1711 Unilateral primary osteoarthritis, right knee: Secondary | ICD-10-CM | POA: Diagnosis not present

## 2016-01-09 DIAGNOSIS — M25661 Stiffness of right knee, not elsewhere classified: Secondary | ICD-10-CM

## 2016-01-09 HISTORY — PX: TOTAL KNEE ARTHROPLASTY: SHX125

## 2016-01-09 LAB — CBC
HEMATOCRIT: 37.3 % (ref 36.0–46.0)
HEMOGLOBIN: 12.3 g/dL (ref 12.0–15.0)
MCH: 32.5 pg (ref 26.0–34.0)
MCHC: 33 g/dL (ref 30.0–36.0)
MCV: 98.4 fL (ref 78.0–100.0)
Platelets: 281 10*3/uL (ref 150–400)
RBC: 3.79 MIL/uL — AB (ref 3.87–5.11)
RDW: 13.2 % (ref 11.5–15.5)
WBC: 11.9 10*3/uL — AB (ref 4.0–10.5)

## 2016-01-09 LAB — PROTIME-INR
INR: 1.22
PROTHROMBIN TIME: 15.5 s — AB (ref 11.4–15.2)

## 2016-01-09 LAB — APTT: APTT: 27 s (ref 24–36)

## 2016-01-09 LAB — CREATININE, SERUM: Creatinine, Ser: 0.76 mg/dL (ref 0.44–1.00)

## 2016-01-09 SURGERY — ARTHROPLASTY, KNEE, TOTAL
Anesthesia: Spinal | Laterality: Right

## 2016-01-09 MED ORDER — METHOCARBAMOL 500 MG PO TABS
500.0000 mg | ORAL_TABLET | Freq: Four times a day (QID) | ORAL | Status: DC | PRN
  Administered 2016-01-09 – 2016-01-12 (×5): 500 mg via ORAL
  Filled 2016-01-09 (×5): qty 1

## 2016-01-09 MED ORDER — SODIUM CHLORIDE 0.9 % IR SOLN
Status: DC | PRN
Start: 2016-01-09 — End: 2016-01-09
  Administered 2016-01-09: 3000 mL

## 2016-01-09 MED ORDER — FENTANYL CITRATE (PF) 100 MCG/2ML IJ SOLN
INTRAMUSCULAR | Status: AC
  Filled 2016-01-09: qty 2

## 2016-01-09 MED ORDER — BUPIVACAINE HCL (PF) 0.5 % IJ SOLN
INTRAMUSCULAR | Status: AC
  Filled 2016-01-09: qty 30

## 2016-01-09 MED ORDER — PHENYLEPHRINE 40 MCG/ML (10ML) SYRINGE FOR IV PUSH (FOR BLOOD PRESSURE SUPPORT)
PREFILLED_SYRINGE | INTRAVENOUS | Status: AC
  Filled 2016-01-09: qty 10

## 2016-01-09 MED ORDER — ONDANSETRON HCL 4 MG/2ML IJ SOLN
INTRAMUSCULAR | Status: AC
  Filled 2016-01-09: qty 2

## 2016-01-09 MED ORDER — ACETAMINOPHEN 325 MG PO TABS: 650.0000 mg | ORAL_TABLET | Freq: Four times a day (QID) | ORAL | Status: DC | PRN

## 2016-01-09 MED ORDER — CEFAZOLIN SODIUM-DEXTROSE 2-4 GM/100ML-% IV SOLN
2.0000 g | Freq: Four times a day (QID) | INTRAVENOUS | Status: AC
  Administered 2016-01-09 (×2): 2 g via INTRAVENOUS
  Filled 2016-01-09 (×2): qty 100

## 2016-01-09 MED ORDER — HYDROMORPHONE HCL 2 MG/ML IJ SOLN
0.5000 mg | INTRAMUSCULAR | Status: DC | PRN
  Administered 2016-01-09: 0.5 mg via INTRAVENOUS
  Administered 2016-01-09 – 2016-01-10 (×3): 1 mg via INTRAVENOUS
  Filled 2016-01-09 (×4): qty 1

## 2016-01-09 MED ORDER — DIPHENHYDRAMINE HCL 12.5 MG/5ML PO ELIX: 12.5000 mg | ORAL_SOLUTION | ORAL | Status: DC | PRN

## 2016-01-09 MED ORDER — ACETAMINOPHEN 650 MG RE SUPP: 650.0000 mg | Freq: Four times a day (QID) | RECTAL | Status: DC | PRN

## 2016-01-09 MED ORDER — ESCITALOPRAM OXALATE 10 MG PO TABS
20.0000 mg | ORAL_TABLET | Freq: Every day | ORAL | Status: DC
  Administered 2016-01-09 – 2016-01-11 (×3): 20 mg via ORAL
  Filled 2016-01-09 (×3): qty 2

## 2016-01-09 MED ORDER — SODIUM CHLORIDE 0.9 % IV SOLN
2000.0000 mg | INTRAVENOUS | Status: AC
  Administered 2016-01-09: 2000 mg via TOPICAL
  Filled 2016-01-09: qty 20

## 2016-01-09 MED ORDER — ROCURONIUM BROMIDE 10 MG/ML (PF) SYRINGE
PREFILLED_SYRINGE | INTRAVENOUS | Status: AC
  Filled 2016-01-09: qty 10

## 2016-01-09 MED ORDER — BISACODYL 5 MG PO TBEC
5.0000 mg | DELAYED_RELEASE_TABLET | Freq: Every day | ORAL | Status: DC | PRN
Start: 2016-01-09 — End: 2016-01-12

## 2016-01-09 MED ORDER — CEFAZOLIN SODIUM-DEXTROSE 2-4 GM/100ML-% IV SOLN
2.0000 g | INTRAVENOUS | Status: AC
  Administered 2016-01-09: 2 g via INTRAVENOUS
  Filled 2016-01-09: qty 100

## 2016-01-09 MED ORDER — MIDAZOLAM HCL 5 MG/5ML IJ SOLN
INTRAMUSCULAR | Status: DC | PRN
  Administered 2016-01-09: 2 mg via INTRAVENOUS

## 2016-01-09 MED ORDER — LIDOCAINE 2% (20 MG/ML) 5 ML SYRINGE
INTRAMUSCULAR | Status: AC
  Filled 2016-01-09: qty 10

## 2016-01-09 MED ORDER — FENTANYL CITRATE (PF) 100 MCG/2ML IJ SOLN
INTRAMUSCULAR | Status: DC | PRN
  Administered 2016-01-09 (×3): 100 ug via INTRAVENOUS

## 2016-01-09 MED ORDER — TRAZODONE HCL 50 MG PO TABS
150.0000 mg | ORAL_TABLET | Freq: Every day | ORAL | Status: DC
  Administered 2016-01-09 – 2016-01-11 (×3): 150 mg via ORAL
  Filled 2016-01-09 (×3): qty 1

## 2016-01-09 MED ORDER — FUROSEMIDE 20 MG PO TABS: 20.0000 mg | ORAL_TABLET | Freq: Every day | ORAL | Status: DC | PRN

## 2016-01-09 MED ORDER — TOPIRAMATE 100 MG PO TABS
200.0000 mg | ORAL_TABLET | Freq: Every day | ORAL | Status: DC
  Administered 2016-01-09 – 2016-01-11 (×3): 200 mg via ORAL
  Filled 2016-01-09 (×3): qty 2

## 2016-01-09 MED ORDER — HYDROMORPHONE HCL 1 MG/ML IJ SOLN
INTRAMUSCULAR | Status: AC
  Filled 2016-01-09: qty 1

## 2016-01-09 MED ORDER — MIDAZOLAM HCL 2 MG/2ML IJ SOLN
INTRAMUSCULAR | Status: AC
  Filled 2016-01-09: qty 2

## 2016-01-09 MED ORDER — HYDROMORPHONE HCL 1 MG/ML IJ SOLN
0.2500 mg | INTRAMUSCULAR | Status: DC | PRN
  Administered 2016-01-09 (×4): 0.5 mg via INTRAVENOUS

## 2016-01-09 MED ORDER — ALUM & MAG HYDROXIDE-SIMETH 200-200-20 MG/5ML PO SUSP: 30.0000 mL | ORAL | Status: DC | PRN

## 2016-01-09 MED ORDER — METOCLOPRAMIDE HCL 5 MG/ML IJ SOLN: 5.0000 mg | Freq: Three times a day (TID) | INTRAMUSCULAR | Status: DC | PRN

## 2016-01-09 MED ORDER — PHENOL 1.4 % MT LIQD: 1.0000 | OROMUCOSAL | Status: DC | PRN

## 2016-01-09 MED ORDER — PHENYLEPHRINE HCL 10 MG/ML IJ SOLN
INTRAMUSCULAR | Status: DC | PRN
  Administered 2016-01-09 (×3): 120 ug via INTRAVENOUS
  Administered 2016-01-09: 80 ug via INTRAVENOUS
  Administered 2016-01-09: 120 ug via INTRAVENOUS

## 2016-01-09 MED ORDER — ENOXAPARIN SODIUM 30 MG/0.3ML ~~LOC~~ SOLN
30.0000 mg | Freq: Two times a day (BID) | SUBCUTANEOUS | Status: DC
  Administered 2016-01-10 – 2016-01-12 (×5): 30 mg via SUBCUTANEOUS
  Filled 2016-01-09 (×5): qty 0.3

## 2016-01-09 MED ORDER — DEXAMETHASONE SODIUM PHOSPHATE 10 MG/ML IJ SOLN
INTRAMUSCULAR | Status: AC
  Filled 2016-01-09: qty 1

## 2016-01-09 MED ORDER — MEPERIDINE HCL 25 MG/ML IJ SOLN: 6.2500 mg | INTRAMUSCULAR | Status: DC | PRN

## 2016-01-09 MED ORDER — DOCUSATE SODIUM 100 MG PO CAPS
100.0000 mg | ORAL_CAPSULE | Freq: Two times a day (BID) | ORAL | Status: DC
  Administered 2016-01-09 – 2016-01-12 (×7): 100 mg via ORAL
  Filled 2016-01-09 (×7): qty 1

## 2016-01-09 MED ORDER — METHOCARBAMOL 500 MG PO TABS
ORAL_TABLET | ORAL | Status: AC
  Filled 2016-01-09: qty 1

## 2016-01-09 MED ORDER — BUPIVACAINE HCL (PF) 0.25 % IJ SOLN
INTRAMUSCULAR | Status: AC
  Filled 2016-01-09: qty 30

## 2016-01-09 MED ORDER — WARFARIN SODIUM 6 MG PO TABS
9.0000 mg | ORAL_TABLET | Freq: Once | ORAL | Status: AC
  Administered 2016-01-09: 9 mg via ORAL
  Filled 2016-01-09: qty 1

## 2016-01-09 MED ORDER — WARFARIN - PHARMACIST DOSING INPATIENT: Freq: Every day | Status: DC

## 2016-01-09 MED ORDER — SODIUM CHLORIDE 0.9 % IJ SOLN
INTRAMUSCULAR | Status: DC | PRN
  Administered 2016-01-09: 40 mL via INTRAVENOUS

## 2016-01-09 MED ORDER — SUGAMMADEX SODIUM 200 MG/2ML IV SOLN
INTRAVENOUS | Status: AC
Start: 2016-01-09 — End: 2016-01-09
  Filled 2016-01-09: qty 2

## 2016-01-09 MED ORDER — HYDROCODONE-ACETAMINOPHEN 5-325 MG PO TABS
1.0000 | ORAL_TABLET | ORAL | Status: DC | PRN
  Administered 2016-01-09 – 2016-01-10 (×5): 2 via ORAL
  Administered 2016-01-10: 1 via ORAL
  Administered 2016-01-10 – 2016-01-12 (×9): 2 via ORAL
  Filled 2016-01-09 (×5): qty 2
  Filled 2016-01-09: qty 1
  Filled 2016-01-09 (×9): qty 2

## 2016-01-09 MED ORDER — BUPIVACAINE LIPOSOME 1.3 % IJ SUSP
20.0000 mL | INTRAMUSCULAR | Status: AC
Start: 2016-01-09 — End: 2016-01-09
  Administered 2016-01-09: 20 mL
  Filled 2016-01-09: qty 20

## 2016-01-09 MED ORDER — GABAPENTIN 600 MG PO TABS
600.0000 mg | ORAL_TABLET | Freq: Every day | ORAL | Status: DC
  Administered 2016-01-09 – 2016-01-12 (×17): 600 mg via ORAL
  Filled 2016-01-09 (×18): qty 1

## 2016-01-09 MED ORDER — CHLORHEXIDINE GLUCONATE 4 % EX LIQD: 60.0000 mL | Freq: Once | CUTANEOUS | Status: DC

## 2016-01-09 MED ORDER — ONDANSETRON HCL 4 MG PO TABS: 4.0000 mg | ORAL_TABLET | Freq: Four times a day (QID) | ORAL | Status: DC | PRN

## 2016-01-09 MED ORDER — MENTHOL 3 MG MT LOZG: 1.0000 | LOZENGE | OROMUCOSAL | Status: DC | PRN

## 2016-01-09 MED ORDER — ONDANSETRON HCL 4 MG/2ML IJ SOLN
4.0000 mg | Freq: Four times a day (QID) | INTRAMUSCULAR | Status: DC | PRN
  Administered 2016-01-10 – 2016-01-11 (×2): 4 mg via INTRAVENOUS
  Filled 2016-01-09 (×2): qty 2

## 2016-01-09 MED ORDER — METOCLOPRAMIDE HCL 5 MG PO TABS: 5.0000 mg | ORAL_TABLET | Freq: Three times a day (TID) | ORAL | Status: DC | PRN

## 2016-01-09 MED ORDER — METHOCARBAMOL 1000 MG/10ML IJ SOLN: 500.0000 mg | Freq: Four times a day (QID) | INTRAVENOUS | Status: DC | PRN

## 2016-01-09 MED ORDER — ONDANSETRON HCL 4 MG/2ML IJ SOLN
INTRAMUSCULAR | Status: DC | PRN
  Administered 2016-01-09: 4 mg via INTRAVENOUS

## 2016-01-09 MED ORDER — PROPOFOL 10 MG/ML IV BOLUS
INTRAVENOUS | Status: AC
  Filled 2016-01-09: qty 20

## 2016-01-09 MED ORDER — BUPIVACAINE-EPINEPHRINE (PF) 0.5% -1:200000 IJ SOLN
INTRAMUSCULAR | Status: DC | PRN
  Administered 2016-01-09: 30 mL via PERINEURAL

## 2016-01-09 MED ORDER — ROCURONIUM BROMIDE 100 MG/10ML IV SOLN
INTRAVENOUS | Status: DC | PRN
  Administered 2016-01-09: 50 mg via INTRAVENOUS

## 2016-01-09 MED ORDER — LIDOCAINE HCL (CARDIAC) 20 MG/ML IV SOLN
INTRAVENOUS | Status: DC | PRN
  Administered 2016-01-09: 40 mg via INTRATRACHEAL

## 2016-01-09 MED ORDER — 0.9 % SODIUM CHLORIDE (POUR BTL) OPTIME
TOPICAL | Status: DC | PRN
  Administered 2016-01-09: 1000 mL

## 2016-01-09 MED ORDER — ONDANSETRON HCL 4 MG/2ML IJ SOLN: 4.0000 mg | Freq: Once | INTRAMUSCULAR | Status: DC | PRN

## 2016-01-09 MED ORDER — SUGAMMADEX SODIUM 200 MG/2ML IV SOLN
INTRAVENOUS | Status: DC | PRN
  Administered 2016-01-09: 200 mg via INTRAVENOUS

## 2016-01-09 MED ORDER — PROPOFOL 10 MG/ML IV BOLUS
INTRAVENOUS | Status: DC | PRN
  Administered 2016-01-09: 130 mg via INTRAVENOUS

## 2016-01-09 MED ORDER — LACTATED RINGERS IV SOLN: INTRAVENOUS | Status: DC

## 2016-01-09 MED ORDER — LACTATED RINGERS IV SOLN
INTRAVENOUS | Status: DC
  Administered 2016-01-09 (×2): via INTRAVENOUS

## 2016-01-09 MED ORDER — BUPIVACAINE HCL (PF) 0.25 % IJ SOLN
INTRAMUSCULAR | Status: DC | PRN
  Administered 2016-01-09: 20 mL

## 2016-01-09 SURGICAL SUPPLY — 67 items
BAG DECANTER FOR FLEXI CONT (MISCELLANEOUS) IMPLANT
BANDAGE ACE 4X5 VEL STRL LF (GAUZE/BANDAGES/DRESSINGS) ×3 IMPLANT
BANDAGE ESMARK 6X9 LF (GAUZE/BANDAGES/DRESSINGS) ×1 IMPLANT
BLADE SAGITTAL 25.0X1.19X90 (BLADE) ×1 IMPLANT
BLADE SAGITTAL 25.0X1.19X90MM (BLADE) ×1
BLADE SAW SGTL 13.0X1.19X90.0M (BLADE) IMPLANT
BLADE SURG ROTATE 9660 (MISCELLANEOUS) IMPLANT
BNDG CMPR 9X6 STRL LF SNTH (GAUZE/BANDAGES/DRESSINGS) ×1
BNDG CMPR MED 10X6 ELC LF (GAUZE/BANDAGES/DRESSINGS) ×1
BNDG ELASTIC 6X10 VLCR STRL LF (GAUZE/BANDAGES/DRESSINGS) ×3 IMPLANT
BNDG ESMARK 6X9 LF (GAUZE/BANDAGES/DRESSINGS) ×3
BNDG GAUZE ELAST 4 BULKY (GAUZE/BANDAGES/DRESSINGS) ×6 IMPLANT
BOWL SMART MIX CTS (DISPOSABLE) ×3 IMPLANT
CAP KNEE TOTAL 3 SIGMA ×2 IMPLANT
CEMENT HV SMART SET (Cement) ×6 IMPLANT
CLOSURE WOUND 1/2 X4 (GAUZE/BANDAGES/DRESSINGS) ×1
COVER SURGICAL LIGHT HANDLE (MISCELLANEOUS) ×3 IMPLANT
CUFF TOURNIQUET SINGLE 34IN LL (TOURNIQUET CUFF) ×3 IMPLANT
CUFF TOURNIQUET SINGLE 44IN (TOURNIQUET CUFF) IMPLANT
DECANTER SPIKE VIAL GLASS SM (MISCELLANEOUS) ×3 IMPLANT
DRAPE HALF SHEET 40X57 (DRAPES) ×3 IMPLANT
DRAPE PROXIMA HALF (DRAPES) ×3 IMPLANT
DRAPE U-SHAPE 47X51 STRL (DRAPES) ×3 IMPLANT
DRSG ADAPTIC 3X8 NADH LF (GAUZE/BANDAGES/DRESSINGS) ×3 IMPLANT
DRSG PAD ABDOMINAL 8X10 ST (GAUZE/BANDAGES/DRESSINGS) ×5 IMPLANT
DURAPREP 26ML APPLICATOR (WOUND CARE) ×3 IMPLANT
ELECT REM PT RETURN 9FT ADLT (ELECTROSURGICAL) ×3
ELECTRODE REM PT RTRN 9FT ADLT (ELECTROSURGICAL) ×1 IMPLANT
GAUZE SPONGE 4X4 12PLY STRL (GAUZE/BANDAGES/DRESSINGS) ×3 IMPLANT
GLOVE BIO SURGEON STRL SZ8 (GLOVE) ×6 IMPLANT
GLOVE BIOGEL PI IND STRL 8 (GLOVE) ×2 IMPLANT
GLOVE BIOGEL PI INDICATOR 8 (GLOVE) ×4
GOWN STRL REUS W/ TWL LRG LVL3 (GOWN DISPOSABLE) ×1 IMPLANT
GOWN STRL REUS W/ TWL XL LVL3 (GOWN DISPOSABLE) ×2 IMPLANT
GOWN STRL REUS W/TWL LRG LVL3 (GOWN DISPOSABLE) ×3
GOWN STRL REUS W/TWL XL LVL3 (GOWN DISPOSABLE) ×6
HANDPIECE INTERPULSE COAX TIP (DISPOSABLE) ×3
HOOD PEEL AWAY FACE SHEILD DIS (HOOD) ×6 IMPLANT
IMMOBILIZER KNEE 22 UNIV (SOFTGOODS) ×3 IMPLANT
KIT BASIN OR (CUSTOM PROCEDURE TRAY) ×3 IMPLANT
KIT ROOM TURNOVER OR (KITS) ×3 IMPLANT
MANIFOLD NEPTUNE II (INSTRUMENTS) ×3 IMPLANT
NDL HYPO 21X1 ECLIPSE (NEEDLE) ×1 IMPLANT
NEEDLE HYPO 21X1 ECLIPSE (NEEDLE) ×3 IMPLANT
NS IRRIG 1000ML POUR BTL (IV SOLUTION) ×3 IMPLANT
PACK TOTAL JOINT (CUSTOM PROCEDURE TRAY) ×3 IMPLANT
PACK UNIVERSAL I (CUSTOM PROCEDURE TRAY) ×3 IMPLANT
PAD ARMBOARD 7.5X6 YLW CONV (MISCELLANEOUS) ×6 IMPLANT
SET HNDPC FAN SPRY TIP SCT (DISPOSABLE) ×1 IMPLANT
STAPLER VISISTAT 35W (STAPLE) IMPLANT
STRIP CLOSURE SKIN 1/2X4 (GAUZE/BANDAGES/DRESSINGS) ×2 IMPLANT
SUCTION FRAZIER HANDLE 10FR (MISCELLANEOUS)
SUCTION TUBE FRAZIER 10FR DISP (MISCELLANEOUS) IMPLANT
SUT MNCRL AB 3-0 PS2 18 (SUTURE) ×2 IMPLANT
SUT VIC AB 0 CT1 27 (SUTURE) ×12
SUT VIC AB 0 CT1 27XBRD ANBCTR (SUTURE) ×2 IMPLANT
SUT VIC AB 2-0 CT1 27 (SUTURE) ×12
SUT VIC AB 2-0 CT1 TAPERPNT 27 (SUTURE) ×2 IMPLANT
SUT VLOC 180 0 24IN GS25 (SUTURE) ×3 IMPLANT
SUT VLOC 180 0 9IN  GS21 (SUTURE) ×2
SUT VLOC 180 0 9IN GS21 (SUTURE) IMPLANT
SYR 50ML LL SCALE MARK (SYRINGE) ×3 IMPLANT
TOWEL OR 17X24 6PK STRL BLUE (TOWEL DISPOSABLE) ×3 IMPLANT
TOWEL OR 17X26 10 PK STRL BLUE (TOWEL DISPOSABLE) ×3 IMPLANT
TRAY CATH 16FR W/PLASTIC CATH (SET/KITS/TRAYS/PACK) IMPLANT
TRAY FOLEY CATH 14FR (SET/KITS/TRAYS/PACK) IMPLANT
UPCHARGE REV TRAY MBT KNEE ×2 IMPLANT

## 2016-01-09 NOTE — Op Note (Signed)
PREOP DIAGNOSIS: DJD RIGHT KNEE POSTOP DIAGNOSIS: same PROCEDURE: RIGHT TKR ANESTHESIA: General and block ATTENDING SURGEON: Davonne Jarnigan G ASSISTANT: Loni Dolly PA  INDICATIONS FOR PROCEDURE: Emily Peck is a 60 y.o. female who has struggled for a long time with pain due to degenerative arthritis of the right knee.  The patient has failed many conservative non-operative measures and at this point has pain which limits the ability to sleep and walk.  The patient is offered total knee replacement.  Informed operative consent was obtained after discussion of possible risks of anesthesia, infection, neurovascular injury, DVT, and death.  The importance of the post-operative rehabilitation protocol to optimize result was stressed extensively with the patient.  SUMMARY OF FINDINGS AND PROCEDURE:  Emily Peck was taken to the operative suite where under the above anesthesia a right knee replacement was performed.  There were advanced degenerative changes and the bone quality was good.  We used the DePuy LCS system and placed size medium femur, 2 MBT revision tibia, 32 mm all polyethylene patella, and a size 12.5 mm spacer.  Loni Dolly PA-C assisted throughout and was invaluable to the completion of the case in that he helped retract and maintain exposure while I placed components.  He also helped close thereby minimizing OR time.  The patient was admitted for appropriate post-op care to include perioperative antibiotics and mechanical and pharmacologic measures for DVT prophylaxis.  DESCRIPTION OF PROCEDURE:  Emily Peck was taken to the operative suite where the above anesthesia was applied.  The patient was positioned supine and prepped and draped in normal sterile fashion.  An appropriate time out was performed.  After the administration of kefzol pre-op antibiotic the leg was elevated and exsanguinated and a tourniquet inflated. A standard longitudinal incision was made on the anterior  knee.  Dissection was carried down to the extensor mechanism.  All appropriate anti-infective measures were used including the pre-operative antibiotic, betadine impregnated drape, and closed hooded exhaust systems for each member of the surgical team.  A medial parapatellar incision was made in the extensor mechanism and the knee cap flipped and the knee flexed.  Some residual meniscal tissues were removed along with any remaining ACL/PCL tissue.  A guide was placed on the tibia and a flat cut was made on it's superior surface.  An intramedullary guide was placed in the femur and was utilized to make anterior and posterior cuts creating an appropriate flexion gap.  A second intramedullary guide was placed in the femur to make a distal cut properly balancing the knee with an extension gap equal to the flexion gap.  The three bones sized to the above mentioned sizes and the appropriate guides were placed and utilized.  A trial reduction was done and the knee easily came to full extension and the patella tracked well on flexion.  The trial components were removed and all bones were cleaned with pulsatile lavage and then dried thoroughly.  Cement was mixed and was pressurized onto the bones followed by placement of the aforementioned components.  Excess cement was trimmed and pressure was held on the components until the cement had hardened.  The tourniquet was deflated and a small amount of bleeding was controlled with cautery and pressure.  The knee was irrigated thoroughly.  The extensor mechanism was re-approximated with V-loc suture in running fashion.  The knee was flexed and the repair was solid.  The subcutaneous tissues were re-approximated with #0 and #2-0 vicryl and the skin closed  with a subcuticular stitch and steristrips.  A sterile dressing was applied.  Intraoperative fluids, EBL, and tourniquet time can be obtained from anesthesia records.  DISPOSITION:  The patient was taken to recovery room in  stable condition and admitted for appropriate post-op care to include peri-operative antibiotic and DVT prophylaxis with mechanical and pharmacologic measures.  Emily Peck G 01/09/2016, 9:22 AM

## 2016-01-09 NOTE — Progress Notes (Signed)
ANTICOAGULATION CONSULT NOTE - Initial Consult  Pharmacy Consult for warfarin Indication: VTE prophylaxis  Allergies  Allergen Reactions  . No Known Allergies     Patient Measurements: Height: 5\' 5"  (165.1 cm) Weight: 209 lb (94.8 kg) IBW/kg (Calculated) : 57   Vital Signs: Temp: 98.2 F (36.8 C) (11/07 1120) Temp Source: Oral (11/07 0630) BP: 122/69 (11/07 1120) Pulse Rate: 75 (11/07 1120)  Labs:  Recent Labs  01/09/16 0630  APTT 27  LABPROT 15.5*  INR 1.22    Estimated Creatinine Clearance: 82 mL/min (by C-G formula based on SCr of 0.81 mg/dL).   Medical History: Past Medical History:  Diagnosis Date  . Anxiety   . Clotting disorder ([place])   . Depression   . Dyspnea   . GERD (gastroesophageal reflux disease)   . Headache   . Lumbosacral spondylosis without myelopathy   . Neuropathy ([place])   . Postlaminectomy syndrome, lumbar region   . Postlaminectomy syndrome, thoracic region   . 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 History of fluctuating INRs but has declined other options in past per chart review. Pharmacy consulted to resume warfarin 11/7. Also started on Lovenox prophylaxis per MD. INR 1.22 at baseline, no CBC yet. No bleed documented.  PTA dose: 6mg  except 9mg  on MWF (last dose PTA 11/3)  Goal of Therapy:  INR 2-3 Monitor platelets by anticoagulation protocol: Yes   Plan:  Warfarin 9mg  PO x 1 dose tonight Lovenox 30mg  [place] q12h per MD - d/c when INR at least 1.8 per order Daily INR Monitor for s/sx bleeding   [name], PharmD, BCPS Clinical Pharmacist 01/09/2016 11:39 AM

## 2016-01-09 NOTE — Transfer of Care (Signed)
Immediate Anesthesia Transfer of Care Note  Patient: Emily Peck  Procedure(s) Performed: Procedure(s): TOTAL KNEE ARTHROPLASTY (Right)  Patient Location: PACU  Anesthesia Type:General  Level of Consciousness: awake, alert  and oriented  Airway & Oxygen Therapy: Patient Spontanous Breathing and Patient connected to nasal cannula oxygen  Post-op Assessment: Report given to RN, Post -op Vital signs reviewed and stable and Patient moving all extremities X 4  Post vital signs: Reviewed and stable  Last Vitals:  Vitals:   01/09/16 0630  BP: 121/68  Resp: 20  Temp: 36.7 C    Last Pain:  Vitals:   01/09/16 0630  TempSrc: Oral  PainSc:       Patients Stated Pain Goal: 5 ([id number])  Complications: No apparent anesthesia complications

## 2016-01-09 NOTE — Anesthesia Procedure Notes (Addendum)
Anesthesia Regional Block:  Adductor canal block  Pre-Anesthetic Checklist: ,, timeout performed, Correct Patient, Correct Site, Correct Laterality, Correct Procedure, Correct Position, site marked, Risks and benefits discussed,  Surgical consent,  Pre-op evaluation,  At surgeon's request and post-op pain management  Laterality: Right  Prep: chloraprep       Needles:  Injection technique: Single-shot  Needle Type: Echogenic Stimulator Needle     Needle Length: 9cm 9 cm Needle Gauge: 21 and 21 G    Additional Needles:  Procedures: ultrasound guided (picture in chart) Adductor canal block Narrative:  Start time: 01/09/2016 9:37 AM End time: 01/09/2016 9:47 AM Injection made incrementally with aspirations every 5 mL.  Performed by: Personally  Anesthesiologist: Lillia Abed  Additional Notes: Monitors applied. Patient sedated. Sterile prep and drape,hand hygiene and sterile gloves were used. Relevant anatomy identified.Needle position confirmed.Local anesthetic injected incrementally after negative aspiration. Local anesthetic spread visualized around nerve(s). Vascular puncture avoided. No complications. Image printed for medical record.The patient tolerated the procedure well.    Lillia Abed MD

## 2016-01-09 NOTE — Anesthesia Postprocedure Evaluation (Signed)
Anesthesia Post Note  Patient: Emily Peck  Procedure(s) Performed: Procedure(s) (LRB): TOTAL KNEE ARTHROPLASTY (Right)  Patient location during evaluation: PACU Anesthesia Type: General Level of consciousness: awake and alert Pain management: pain level controlled Vital Signs Assessment: post-procedure vital signs reviewed and stable Respiratory status: spontaneous breathing, nonlabored ventilation, respiratory function stable and patient connected to nasal cannula oxygen Cardiovascular status: blood pressure returned to baseline and stable Postop Assessment: no signs of nausea or vomiting Anesthetic complications: no    Last Vitals:  Vitals:   01/09/16 1120 01/09/16 1300  BP: 122/69 (!) 110/91  Pulse: 75 83  Resp: 19 18  Temp: 36.8 C 37.1 C    Last Pain:  Vitals:   01/09/16 1557  TempSrc:   PainSc: [place] DAVID

## 2016-01-09 NOTE — Progress Notes (Signed)
Orthopedic Tech Progress Note Patient Details:  Emily Peck Jul 27, 1955 [id number]  Patient was having pain bumped CPM to 80 degrees of flexion. OHF w/ Trapeze applied. CPM Right Knee CPM Right Knee: On Right Knee Flexion (Degrees): 80 Right Knee Extension (Degrees): 0 Additional Comments: Pain      Charlott Rakes 01/09/2016, 11:08 AM

## 2016-01-09 NOTE — Anesthesia Preprocedure Evaluation (Addendum)
Anesthesia Evaluation  Patient identified by MRN, date of birth, ID band Patient awake    Reviewed: Allergy & Precautions, NPO status , Patient's Chart, lab work & pertinent test results  Airway Mallampati: I  TM Distance: >3 FB Neck ROM: Full    Dental   Pulmonary    Pulmonary exam normal        Cardiovascular Normal cardiovascular exam     Neuro/Psych Anxiety Depression    GI/Hepatic GERD  Medicated and Controlled,  Endo/Other    Renal/GU      Musculoskeletal   Abdominal   Peds  Hematology   Anesthesia Other Findings   Reproductive/Obstetrics                            Anesthesia Physical Anesthesia Plan  ASA: II  Anesthesia Plan: General   Post-op Pain Management: GA combined w/ Regional for post-op pain   Induction: Intravenous  Airway Management Planned: Oral ETT  Additional Equipment:   Intra-op Plan:   Post-operative Plan: Extubation in OR  Informed Consent: I have reviewed the patients History and Physical, chart, labs and discussed the procedure including the risks, benefits and alternatives for the proposed anesthesia with the patient or authorized representative who has indicated his/her understanding and acceptance.     Plan Discussed with: CRNA and Surgeon  Anesthesia Plan Comments:         Anesthesia Quick Evaluation

## 2016-01-09 NOTE — Interval H&P Note (Signed)
History and Physical Interval Note:  01/09/2016 7:18 AM  Emily Peck  has presented today for surgery, with the diagnosis of RIGHT KNEE DEGENERATIVE JOINT DISEASE  The various methods of treatment have been discussed with the patient and family. After consideration of risks, benefits and other options for treatment, the patient has consented to  Procedure(s): TOTAL KNEE ARTHROPLASTY (Right) as a surgical intervention .  The patient's history has been reviewed, patient examined, no change in status, stable for surgery.  I have reviewed the patient's chart and labs.  Questions were answered to the patient's satisfaction.     Osiel Stick G

## 2016-01-09 NOTE — Evaluation (Signed)
Physical Therapy Evaluation Patient Details Name: Emily Peck MRN: [id number] DOB: [birth year] Today's Date: 01/09/2016   History of Present Illness  60 yo female admitted on 01/09/16 for right TKA. 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    Clinical Impression  Pt is POD 0 and is lethargic initially possibly due to pain medications. Performed supine to sit with min a for RLE but requested the assistance of nurse tech for [facility] transfer due to above. Pt is able to transfer with Mod a from bed to commode and min a from commode to bed. Performed remainder of eval with min a for transfers and gait. Pt is legally blind and is missing her right eye and impaired in left eye but can see large objects. Pt moves will with verbal cues for maneuvering RW throughout room and for position of items in room. Pt will benefit from continued acute PT services to address the below deficits in order to assist with a safe transition home. Pt would like to return home with son who lives with her. Unsure of [facility] VS SNF at this time until can reassess further at next session.     Follow Up Recommendations Home health PT    Equipment Recommendations  Rolling walker with 5" wheels;3in1 (PT)    Recommendations for Other Services       Precautions / Restrictions Precautions Precautions: Knee Precaution Comments: No pillow behind knee Required Braces or Orthoses: Knee Immobilizer - Right Knee Immobilizer - Right: On when out of bed or walking Restrictions Weight Bearing Restrictions: Yes RLE Weight Bearing: Weight bearing as tolerated      Mobility  Bed Mobility Overal bed mobility: Needs Assistance Bed Mobility: Supine to Sit;Sit to Supine     Supine to sit: Min assist Sit to supine: Min assist   General bed mobility comments: Min a to assist RLE in and out of bed  Transfers Overall transfer level: Needs assistance Equipment used: Rolling walker (2  wheeled) Transfers: Sit to/from [facility] Sit to Stand: Min assist Stand pivot transfers: Mod assist       General transfer comment: Mod A for safety from bed to commode with Nurse tech assisting as client was a little groggy initially. Min A for sit to stand  Ambulation/Gait Ambulation/Gait assistance: Min assist Ambulation Distance (Feet): 20 Feet Assistive device: Rolling walker (2 wheeled) Gait Pattern/deviations: Step-to pattern;Decreased step length - left;Decreased stance time - right;Antalgic Gait velocity: decreased Gait velocity interpretation: <1.8 ft/sec, indicative of risk for recurrent falls General Gait Details: Mod antalgic gait, min a for safety with rw and IV pole  Stairs            Wheelchair Mobility    Modified Rankin (Stroke Patients Only)       Balance                                             Pertinent Vitals/Pain Pain Assessment: 0-10 Pain Score: 7  Pain Location: Right Knee Pain Descriptors / Indicators: Aching;Grimacing;Guarding;Moaning Pain Intervention(s): Monitored during session;Repositioned;Patient requesting pain meds-RN notified;Ice applied    Home Living Family/patient expects to be discharged to:: Private residence Living Arrangements: Children Available Help at Discharge: Family Type of Home: House Home Access: Stairs to enter Entrance Stairs-Rails: Right;Left;Can reach both Entrance Stairs-Number of Steps: 5 Home Layout: One  level Home Equipment: None      Prior Function Level of Independence: Independent         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      Hand Dominance   Dominant Hand: Right    Extremity/Trunk Assessment   Upper Extremity Assessment: Defer to OT evaluation           Lower Extremity Assessment: RLE deficits/detail RLE Deficits / Details: pt with normal post op pain and weakness. at least 3/5 ankle, 2/5 knee and 2/5 hip per gross functional  assessment       Communication   Communication: No difficulties  Cognition Arousal/Alertness: Awake/alert Behavior During Therapy: WFL for tasks assessed/performed Overall Cognitive Status: Within Functional Limits for tasks assessed                      General Comments      Exercises Total Joint Exercises Ankle Circles/Pumps: AROM;Both;20 reps;Supine   Assessment/Plan    PT Assessment Patient needs continued PT services  PT Problem List Decreased strength;Decreased range of motion;Decreased activity tolerance;Decreased balance;Decreased mobility;Decreased knowledge of use of DME;Decreased safety awareness;Decreased knowledge of precautions;Pain          PT Treatment Interventions DME instruction;Gait training;Stair training;Functional mobility training;Therapeutic activities;Therapeutic exercise;Balance training;Patient/family education    PT Goals (Current goals can be found in the Care Plan section)  Acute Rehab PT Goals Patient Stated Goal: to get home  PT Goal Formulation: With patient Time For Goal Achievement: 01/16/16 Potential to Achieve Goals: Good    Frequency 7X/week   Barriers to discharge        Co-evaluation               End of Session Equipment Utilized During Treatment: Gait belt Activity Tolerance: Patient limited by lethargy;Patient limited by pain Patient left: in bed;with call bell/phone within reach;with SCD's reapplied Nurse Communication: Patient requests pain meds;Precautions;Weight bearing status;Mobility status         Time: 1407-1505 PT Time Calculation (min) (ACUTE ONLY): 58 min   Charges:   PT Evaluation $PT Eval Moderate Complexity: 1 Procedure PT Treatments $Gait Training: 8-22 mins $Therapeutic Activity: 23-37 mins   PT G Codes:        Scheryl Marten PT, DPT  [phone]  01/09/2016, 3:51 PM

## 2016-01-09 NOTE — Anesthesia Procedure Notes (Signed)
Procedure Name: Intubation Date/Time: 01/09/2016 7:50 AM Performed by: Mariea Clonts Pre-anesthesia Checklist: Patient identified, Emergency Drugs available, Suction available and Patient being monitored Patient Re-evaluated:Patient Re-evaluated prior to inductionOxygen Delivery Method: Circle System Utilized Preoxygenation: Pre-oxygenation with 100% oxygen Intubation Type: IV induction Ventilation: Mask ventilation without difficulty Laryngoscope Size: Miller and 2 Tube type: Oral Tube size: 7.5 mm Number of attempts: 1 Airway Equipment and Method: Stylet and Oral airway Placement Confirmation: ETT inserted through vocal cords under direct vision,  positive ETCO2 and breath sounds checked- equal and bilateral Tube secured with: Tape Dental Injury: Teeth and Oropharynx as per pre-operative assessment

## 2016-01-10 ENCOUNTER — Encounter (HOSPITAL_COMMUNITY): Payer: Self-pay | Admitting: Orthopaedic Surgery

## 2016-01-10 LAB — PROTIME-INR
INR: 1.33
PROTHROMBIN TIME: 16.6 s — AB (ref 11.4–15.2)

## 2016-01-10 MED ORDER — WARFARIN SODIUM 6 MG PO TABS
9.0000 mg | ORAL_TABLET | Freq: Once | ORAL | Status: AC
  Administered 2016-01-10: 9 mg via ORAL
  Filled 2016-01-10: qty 1

## 2016-01-10 NOTE — Progress Notes (Signed)
Physical Therapy Treatment Patient Details Name: Emily Peck MRN: [id number] DOB: [birth year] Today's Date: 01/10/2016    History of Present Illness 60 yo female admitted on 01/09/16 for right TKA. 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      PT Comments    Pt presents with her food in front of her, but hasn't eaten anything prior to this session. Pt is very lethargic and has difficulty following commands due to lethargy. Assisted pt to [facility] with Mod A with immobilizer in place. Pt spends a large amount of time on commode before being able to transfer to recliner. Son arrives in the middle of this session and reports that this behavior is not normal for his mother. RN is going to limit IV pain medications at this time to see if mental status improves. Son is assisting pt with eating following this session. If no improvement in cognitive status, pt may benefit from SNF placement. Still recommend HHPT, but will reassess this afternoon.   Follow Up Recommendations  Home health PT     Equipment Recommendations  Rolling walker with 5" wheels;3in1 (PT)    Recommendations for Other Services       Precautions / Restrictions Precautions Precautions: Knee Precaution Comments: No pillow behind knee Required Braces or Orthoses: Knee Immobilizer - Right Knee Immobilizer - Right: On when out of bed or walking Restrictions Weight Bearing Restrictions: Yes RLE Weight Bearing: Weight bearing as tolerated    Mobility  Bed Mobility Overal bed mobility: Needs Assistance Bed Mobility: Supine to Sit     Supine to sit: Min assist     General bed mobility comments: Min a to assist RLE in and out of bed  Transfers Overall transfer level: Needs assistance Equipment used: Rolling walker (2 wheeled) Transfers: Stand Pivot Transfers Sit to Stand: Mod assist Stand pivot transfers: Mod assist       General transfer comment: Mod A from bed to [facility]  for toileting at side of bed.   Ambulation/Gait Ambulation/Gait assistance: Mod assist Ambulation Distance (Feet): 10 Feet Assistive device: Rolling walker (2 wheeled) Gait Pattern/deviations: Antalgic Gait velocity: decreased Gait velocity interpretation: <1.8 ft/sec, indicative of risk for recurrent falls General Gait Details: Mod analgia noted, requires max cues for proper sequencing and RLE buckles during gait requiring MOD-Max A and assistance of son to recover.    Stairs            Wheelchair Mobility    Modified Rankin (Stroke Patients Only)       Balance Overall balance assessment: Needs assistance Sitting-balance support: Single extremity supported Sitting balance-Leahy Scale: Fair Sitting balance - Comments: Has difficulty maintaining LUE on railing to maintain sitting at EOB   Standing balance support: Bilateral upper extremity supported Standing balance-Leahy Scale: Poor Standing balance comment: Requires min to mod a to maintain balance at RW due to [place] and disorientation                    Cognition Arousal/Alertness: Lethargic;Suspect due to medications Behavior During Therapy: [facility] for tasks assessed/performed Overall Cognitive Status: Impaired/Different from baseline Area of Impairment: Following commands;Attention   Current Attention Level: Selective   Following Commands: Follows one step commands inconsistently            Exercises Total Joint Exercises Ankle Circles/Pumps: AROM;Both;20 reps;Supine Quad Sets: AROM;10 reps;Supine;Right Heel Slides: AROM;10 reps;Right;Supine    General Comments        Pertinent Vitals/Pain  Pain Assessment: Faces Faces Pain Scale: Hurts little more Pain Location: Right Knee Pain Descriptors / Indicators: Grimacing;Guarding;Moaning Pain Intervention(s): Monitored during session;Premedicated before session;Ice applied    Home Living                      Prior Function             PT Goals (current goals can now be found in the care plan section) Acute Rehab PT Goals Patient Stated Goal: to get home  Progress towards PT goals: Progressing toward goals    Frequency    7X/week      PT Plan Current plan remains appropriate    Co-evaluation             End of Session Equipment Utilized During Treatment: Gait belt Activity Tolerance: Patient limited by lethargy;Patient limited by pain Patient left: in chair;with call bell/phone within reach;with family/visitor present     Time: 0825-0925 PT Time Calculation (min) (ACUTE ONLY): 60 min  Charges:  $Gait Training: 8-22 mins $Therapeutic Exercise: 8-22 mins $Therapeutic Activity: 23-37 mins                    G Codes:      Scheryl Marten PT, DPT  [phone]  01/10/2016, 11:35 AM

## 2016-01-10 NOTE — Progress Notes (Signed)
Orthopedic Tech Progress Note Patient Details:  Emily Peck 1955/09/10 [id number]  Patient ID: Emily Peck, female   DOB: 10-09-1955, 60 y.o.   MRN: [id number]   Emily Peck 01/10/2016, 2:15 PM Placed pt's rle on cpm @0 -70 degrees @1410 ; RN notified

## 2016-01-10 NOTE — Evaluation (Signed)
Occupational Therapy Evaluation Patient Details Name: Emily Peck MRN: [id number] DOB: 1955/05/30 Today's Date: 01/10/2016    History of Present Illness 60 yo female admitted on 01/09/16 for right TKA. 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     Clinical Impression   PTA Pt independent in ADL and mobility. Pt currently set up for seated ADL and +2 for mobility and transfers. Pt with deficits listed below, and will benefit from skilled OT in the acute setting prior to d/c home with [place]. Next session to focus on tub transfer or standing grooming tasks.    Follow Up Recommendations  Home health OT;Supervision/Assistance - 24 hour    Equipment Recommendations  3 in 1 bedside comode    Recommendations for Other Services       Precautions / Restrictions Precautions Precautions: Knee Precaution Booklet Issued: No Precaution Comments: No pillow behind knee Required Braces or Orthoses: Knee Immobilizer - Right Knee Immobilizer - Right: On when out of bed or walking Restrictions Weight Bearing Restrictions: Yes RLE Weight Bearing: Weight bearing as tolerated      Mobility Bed Mobility Overal bed mobility: Needs Assistance Bed Mobility: Sit to Supine     Supine to sit: Min assist Sit to supine: Min assist   General bed mobility comments: Min A for RLE (in [place]) in bed  Transfers Overall transfer level: Needs assistance Equipment used: Rolling walker (2 wheeled) Transfers: Stand Pivot Transfers Sit to Stand: Mod assist Stand pivot transfers: Mod assist;+2 physical assistance;+2 safety/equipment       General transfer comment: Mod A +2 because RLE knee buckling despite KI on    Balance Overall balance assessment: Needs assistance Sitting-balance support: Bilateral upper extremity supported;Feet supported Sitting balance-Leahy Scale: Poor Sitting balance - Comments: Has difficulty maintaining LUE on railing to maintain  sitting at EOB   Standing balance support: Bilateral upper extremity supported;During functional activity Standing balance-Leahy Scale: Poor Standing balance comment: Requires min to mod a to maintain balance at RW due to [place] and disorientation                            ADL Overall ADL's : Needs assistance/impaired Eating/Feeding: Modified independent;Sitting;Bed level;Set up Eating/Feeding Details (indicate cue type and reason): increased light and explanation of what things are on tray Grooming: Wash/dry hands;Set up;Bed level                   Toilet Transfer: Moderate assistance;+2 for physical assistance;+2 for safety/equipment;Cueing for sequencing;Stand-pivot;BSC;RW Toilet Transfer Details (indicate cue type and reason): Pt with very poor use of RW (way out in front of her) and multiple verbal cues for sequence and positioning Toileting- Clothing Manipulation and Hygiene: Minimal assistance Toileting - Clothing Manipulation Details (indicate cue type and reason): Pt able to wipe self on BSC     Functional mobility during ADLs: Moderate assistance;+2 for physical assistance;+2 for safety/equipment;Rolling walker General ADL Comments: Pt very motivated to be independent     Vision     Perception     Praxis      Pertinent Vitals/Pain Pain Assessment: 0-10 Pain Score: 8  Faces Pain Scale: Hurts little more Pain Location: R knee Pain Descriptors / Indicators: Grimacing;Moaning;Sore Pain Intervention(s): Monitored during session;Repositioned;Ice applied     Hand Dominance Right   Extremity/Trunk Assessment Upper Extremity Assessment Upper Extremity Assessment: Generalized weakness   Lower Extremity Assessment Lower Extremity Assessment: RLE  deficits/detail;Defer to PT evaluation RLE Deficits / Details: pt with normal post op pain and weakness. at least 3/5 ankle, 2/5 knee and 2/5 hip per gross functional assessment   Cervical / Trunk  Assessment Cervical / Trunk Assessment: Normal   Communication Communication Communication: No difficulties   Cognition Arousal/Alertness: Awake/alert Behavior During Therapy: WFL for tasks assessed/performed Overall Cognitive Status: No family/caregiver present to determine baseline cognitive functioning Area of Impairment: Attention;Following commands   Current Attention Level: Selective   Following Commands: Follows one step commands inconsistently           General Comments       Exercises      Shoulder Instructions      Home Living Family/patient expects to be discharged to:: Private residence Living Arrangements: Children Available Help at Discharge: Family;Available PRN/intermittently Type of Home: House Home Access: Stairs to enter [employer] of Steps: 5 Entrance Stairs-Rails: Right;Left;Can reach both Home Layout: One level     Bathroom Shower/Tub: Tub/shower unit Shower/tub characteristics: Architectural technologist: Standard Bathroom Accessibility: Yes How Accessible: Accessible via walker Home Equipment: None          Prior Functioning/Environment Level of Independence: Independent        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         OT Problem List: Decreased strength;Decreased range of motion;Decreased activity tolerance;Impaired balance (sitting and/or standing);Impaired vision/perception;Decreased safety awareness;Decreased knowledge of use of DME or AE;Decreased knowledge of precautions;Obesity;Pain   OT Treatment/Interventions: Self-care/ADL training;Therapeutic activities;DME and/or AE instruction;Patient/family education;Balance training    OT Goals(Current goals can be found in the care plan section) Acute Rehab OT Goals Patient Stated Goal: to get home  OT Goal Formulation: With patient Time For Goal Achievement: 01/17/16 Potential to Achieve Goals: Good ADL Goals Pt Will Perform Lower Body Bathing: with  modified independence;sitting/lateral leans Pt Will Perform Lower Body Dressing: with supervision;sit to/from stand;with caregiver independent in assisting Pt Will Transfer to Toilet: ambulating;with supervision;regular height toilet;bedside commode (RW) Pt Will Perform Toileting - Clothing Manipulation and hygiene: with modified independence;sit to/from stand Pt Will Perform Tub/Shower Transfer: Tub transfer;with min guard assist;ambulating;3 in 1;rolling walker  OT Frequency: Min 2X/week   Barriers to D/C:            Co-evaluation              End of Session Equipment Utilized During Treatment: Gait belt;Rolling walker;Right knee immobilizer CPM Right Knee CPM Right Knee: Off Nurse Communication: Mobility status  Activity Tolerance: Patient tolerated treatment well Patient left: in bed;with call bell/phone within reach   Time: 1214-1244 OT Time Calculation (min): 30 min Charges:  OT General Charges $OT Visit: 1 Procedure OT Evaluation $OT Eval Moderate Complexity: 1 Procedure OT Treatments $Self Care/Home Management : 8-22 mins G-Codes:    Merri Ray Navika Hoopes January 22, 2016, 2:16 PM   Hulda Humphrey OTR/L [phone]

## 2016-01-10 NOTE — Progress Notes (Signed)
Physical Therapy Treatment Patient Details Name: Emily Peck MRN: [id number] DOB: November 18, 1955 Today's Date: 01/10/2016    History of Present Illness 60 yo female admitted on 01/09/16 for right TKA. 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      PT Comments    Pt presents with improved participation in afternoon therapy session. Performed stand pivot transfer to [facility] with Min A and stand pivot transfer from commode to RW with Min a. Pt is able to perform gait x 50' this session with improved weight bearing through RLE with decreased knee buckling noted this session as compared to previous session. Pt is no longer taking IV pain medications and is strictly on PO pain medications. Assuming pts condition continues to improve DC home with HHPT may be appropriate. Family is expecting pt to come home with HHPT.   Follow Up Recommendations  Home health PT     Equipment Recommendations  Rolling walker with 5" wheels;3in1 (PT)    Recommendations for Other Services       Precautions / Restrictions Precautions Precautions: Knee Precaution Booklet Issued: No Precaution Comments: No pillow behind knee Required Braces or Orthoses: Knee Immobilizer - Right Knee Immobilizer - Right: On when out of bed or walking Restrictions Weight Bearing Restrictions: Yes RLE Weight Bearing: Weight bearing as tolerated    Mobility  Bed Mobility Overal bed mobility: Needs Assistance Bed Mobility: Supine to Sit     Supine to sit: Min assist Sit to supine: Min assist   General bed mobility comments: Min A for moving RLE to EOB andback into bed  Transfers Overall transfer level: Needs assistance Equipment used: Rolling walker (2 wheeled) Transfers: Sit to/from [facility] Sit to Stand: Min assist Stand pivot transfers: Mod assist       General transfer comment: Min A Stand pivot to [facility], Min A for sit to stand from [facility] to RW    Ambulation/Gait Ambulation/Gait assistance: Min assist Ambulation Distance (Feet): 50 Feet Assistive device: Rolling walker (2 wheeled) Gait Pattern/deviations: Step-through pattern;Decreased step length - left;Decreased stance time - right;Antalgic Gait velocity: decreased Gait velocity interpretation: <1.8 ft/sec, indicative of risk for recurrent falls General Gait Details: Mod Antalgia noted, chair follow for safety with verbal cues for maneuvering RW to avoid obstacles   Stairs            Wheelchair Mobility    Modified Rankin (Stroke Patients Only)       Balance Overall balance assessment: Needs assistance Sitting-balance support: Single extremity supported Sitting balance-Leahy Scale: Poor     Standing balance support: Bilateral upper extremity supported Standing balance-Leahy Scale: Poor Standing balance comment: Requires use of RW to maintain stability in standing and Min A                    Cognition Arousal/Alertness: Awake/alert Behavior During Therapy: WFL for tasks assessed/performed Overall Cognitive Status: Within Functional Limits for tasks assessed Area of Impairment: Attention;Following commands   Current Attention Level: Selective   Following Commands: Follows one step commands inconsistently            Exercises Total Joint Exercises Ankle Circles/Pumps: AROM;Both;20 reps;Supine Quad Sets: AROM;10 reps;Supine;Right Heel Slides: AROM;10 reps;Right;Supine Goniometric ROM: 0-70    General Comments        Pertinent Vitals/Pain Pain Assessment: 0-10 Pain Score: 4  Pain Location: Right Knee Pain Descriptors / Indicators: Aching;Guarding Pain Intervention(s): Monitored during session;Premedicated before session;Ice applied  Home Living Family/patient expects to be discharged to:: Private residence Living Arrangements: Children Available Help at Discharge: Family;Available PRN/intermittently Type of Home: House Home  Access: Stairs to enter Entrance Stairs-Rails: Right;Left;Can reach both Home Layout: One level Home Equipment: None      Prior Function Level of Independence: Independent      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    PT Goals (current goals can now be found in the care plan section) Acute Rehab PT Goals Patient Stated Goal: to get home  Progress towards PT goals: Progressing toward goals    Frequency    7X/week      PT Plan Current plan remains appropriate    Co-evaluation             End of Session Equipment Utilized During Treatment: Gait belt Activity Tolerance: Patient limited by fatigue Patient left: in bed;with call bell/phone within reach;with bed alarm set;with SCD's reapplied     Time: [id number] PT Time Calculation (min) (ACUTE ONLY): 28 min  Charges:  $Gait Training: 8-22 mins $Therapeutic Activity: 8-22 mins                    G Codes:      Scheryl Marten PT, DPT  [phone]  01/10/2016, 3:31 PM

## 2016-01-10 NOTE — Progress Notes (Signed)
[place] for warfarin Indication: VTE prophylaxis  Allergies  Allergen Reactions  . No Known Allergies     Patient Measurements: Height: 5\' 5"  (165.1 cm) Weight: 209 lb (94.8 kg) IBW/kg (Calculated) : 57   Vital Signs: Temp: 99.2 F (37.3 C) (11/08 0544) Temp Source: Oral (11/08 0544) BP: 141/74 (11/08 0544) Pulse Rate: 91 (11/08 0544)  Labs:  Recent Labs  01/09/16 0630 01/09/16 1145 01/10/16 0835  HGB  --  12.3  --   HCT  --  37.3  --   PLT  --  281  --   APTT 27  --   --   LABPROT 15.5*  --  16.6*  INR 1.22  --  1.33  CREATININE  --  0.76  --     Estimated Creatinine Clearance: 83 mL/min (by C-G formula based on SCr of 0.76 mg/dL).   Medical History: Past Medical History:  Diagnosis Date  . Anxiety   . Clotting disorder ([place])   . Depression   . Dyspnea   . GERD (gastroesophageal reflux disease)   . Headache   . Lumbosacral spondylosis without myelopathy   . Neuropathy ([place])   . Postlaminectomy syndrome, lumbar region   . Postlaminectomy syndrome, thoracic region   . 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 History of fluctuating INRs but has declined other anticoagulation options in past per chart review. Pharmacy consulted to resume warfarin 11/7. Also started on Lovenox prophylaxis per MD. INR 1.22 at baseline, now up 1.33 after 1 dose resumed. CBC wnl, no bleed documented.  PTA dose: 6mg  except 9mg  on MWF (last dose PTA 11/3)  Goal of Therapy:  INR 2-3 Monitor platelets by anticoagulation protocol: Yes   Plan:  Warfarin 9mg  PO x 1 dose tonight Lovenox 30mg  [place] q12h per MD - d/c when INR at least 1.8 per order Daily INR Monitor for s/sx bleeding   [name], PharmD, BCPS Clinical Pharmacist 01/10/2016 9:36 AM

## 2016-01-10 NOTE — Progress Notes (Signed)
Subjective: 1 Day Post-Op Procedure(s) (LRB): TOTAL KNEE ARTHROPLASTY (Right)  Activity level:  wbat Diet tolerance:  ok Voiding:  ok Patient reports pain as mild and moderate.    Objective: Vital signs in last 24 hours: Temp:  [97.3 F (36.3 C)-100 F (37.8 C)] 99.2 F (37.3 C) (11/08 0544) Pulse Rate:  [74-91] 91 (11/08 0544) Resp:  [14-19] 18 (11/08 0544) BP: (100-142)/(50-100) 141/74 (11/08 0544) SpO2:  [94 %-100 %] 96 % (11/08 0544)  Labs:  Recent Labs  01/09/16 1145  HGB 12.3    Recent Labs  01/09/16 1145  WBC 11.9*  RBC 3.79*  HCT 37.3  PLT 281    Recent Labs  01/09/16 1145  CREATININE 0.76    Recent Labs  01/09/16 0630  INR 1.22    Physical Exam:  Neurologically intact ABD soft Neurovascular intact Sensation intact distally Intact pulses distally Dorsiflexion/Plantar flexion intact Incision: dressing C/D/I and no drainage No cellulitis present Compartment soft  Assessment/Plan:  1 Day Post-Op Procedure(s) (LRB): TOTAL KNEE ARTHROPLASTY (Right) Advance diet Up with therapy D/C IV fluids Plan for discharge tomorrow Discharge home with home health if doing well and cleared by PT. Continue on home coumadin for DVT prevention. Follow up in office 2 weeks post op. I will change dressing to aquacel tomorrow.    Emily Peck, Emily Peck 01/10/2016, 8:16 AM

## 2016-01-11 LAB — PROTIME-INR
INR: 1.59
PROTHROMBIN TIME: 19.2 s — AB (ref 11.4–15.2)

## 2016-01-11 MED ORDER — HYDROCODONE-ACETAMINOPHEN 5-325 MG PO TABS
1.0000 | ORAL_TABLET | ORAL | 0 refills | Status: DC | PRN
Start: 2016-01-11 — End: 2016-03-08

## 2016-01-11 MED ORDER — METHOCARBAMOL 500 MG PO TABS: 500.0000 mg | ORAL_TABLET | Freq: Four times a day (QID) | ORAL | 0 refills | Status: DC | PRN

## 2016-01-11 MED ORDER — WARFARIN SODIUM 3 MG PO TABS
9.0000 mg | ORAL_TABLET | Freq: Once | ORAL | Status: AC
  Administered 2016-01-11: 9 mg via ORAL
  Filled 2016-01-11: qty 1

## 2016-01-11 NOTE — Progress Notes (Signed)
Occupational Therapy Treatment Patient Details Name: Emily Peck MRN: [id number] DOB: Oct 05, 1955 Today's Date: 01/11/2016    History of present illness 60 yo female admitted on 01/09/16 for right TKA. 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     OT comments  Pt making progress towards OT goals this session, performing ADL standing at sink min A for balance and ambulating into bathroom instead of using BSC. OT is concerned for fall safety going home as Pt is alone during the day. May need to consider short SNF stay if 24 hour assistance cannot be provided, or balance does not improve (need to be at least supervision). OT will continue to follow acutely.   Follow Up Recommendations  Home health OT;Supervision/Assistance - 24 hour    Equipment Recommendations  3 in 1 bedside comode    Recommendations for Other Services      Precautions / Restrictions Precautions Precautions: Knee Precaution Booklet Issued: No Precaution Comments: No pillow behind knee Required Braces or Orthoses: Knee Immobilizer - Right Knee Immobilizer - Right: On when out of bed or walking Restrictions Weight Bearing Restrictions: Yes RLE Weight Bearing: Weight bearing as tolerated       Mobility Bed Mobility Overal bed mobility: Needs Assistance Bed Mobility: Supine to Sit     Supine to sit: Min guard;Min assist     General bed mobility comments: min A with trunk to upright (largely due to bed not able to go completely flat)  Transfers Overall transfer level: Needs assistance Equipment used: Rolling walker (2 wheeled) Transfers: Sit to/from Stand Sit to Stand: Min assist         General transfer comment: min A with verbal cues for safe hand placement    Balance Overall balance assessment: Needs assistance Sitting-balance support: Single extremity supported;Feet supported Sitting balance-Leahy Scale: Poor Sitting balance - Comments: balance  improved as Pt sat EOB   Standing balance support: Bilateral upper extremity supported;No upper extremity supported;During functional activity Standing balance-Leahy Scale: Poor Standing balance comment: when doing ADL at sink, Pt used BUE support on elbows                   ADL Overall ADL's : Needs assistance/impaired     Grooming: Wash/dry hands;Oral care;Wash/dry face;Minimal assistance;Standing Grooming Details (indicate cue type and reason): sink level, MIn A for balance - verbal cues for body position and to assist with location of items due to visual deficits                 Toilet Transfer: Minimal assistance;Min guard;Ambulation;BSC;Comfort height toilet;RW (BSC over comfort height toilet) Toilet Transfer Details (indicate cue type and reason): verbal cues for sequencing and use of RW Toileting- Clothing Manipulation and Hygiene: Minimal assistance;Sit to/from stand       Functional mobility during ADLs: Min guard;Minimal assistance;Cueing for safety;Rolling walker General ADL Comments: Pt with much improvement today and continues to be very motivated to do things for herself      Vision                     Perception     Praxis      Cognition   Behavior During Therapy: [facility] for tasks assessed/performed Overall Cognitive Status: Within Functional Limits for tasks assessed                       Extremity/Trunk Assessment  Exercises     Shoulder Instructions       General Comments      Pertinent Vitals/ Pain       Pain Assessment: 0-10 Pain Score: 6  Faces Pain Scale: Hurts little more Pain Location: R knee Pain Descriptors / Indicators: Aching;Grimacing;Sore Pain Intervention(s): Limited activity within patient's tolerance;Monitored during session;Repositioned;Ice applied  Home Living                                          Prior Functioning/Environment              Frequency   Min 2X/week        Progress Toward Goals  OT Goals(current goals can now be found in the care plan section)  Progress towards OT goals: Progressing toward goals  Acute Rehab OT Goals Patient Stated Goal: to get home  OT Goal Formulation: With patient Time For Goal Achievement: 01/17/16 Potential to Achieve Goals: Good  Plan Discharge plan remains appropriate;Other (comment) (consider SNF if 24 hour care not available)    Co-evaluation    PT/OT/SLP Co-Evaluation/Treatment: Yes Reason for Co-Treatment: For patient/therapist safety PT goals addressed during session: Mobility/safety with mobility OT goals addressed during session: ADL's and self-care      End of Session Equipment Utilized During Treatment: Gait belt;Rolling walker;Right knee immobilizer CPM Right Knee Additional Comments: Pain   Activity Tolerance Patient tolerated treatment well   Patient Left in chair;with call bell/phone within reach   Nurse Communication Mobility status        Time: [id number] OT Time Calculation (min): 29 min  Charges: OT General Charges $OT Visit: 1 Procedure OT Treatments $Self Care/Home Management : 8-22 mins  Merri Ray Okie Jansson 01/11/2016, 10:31 AM  Hulda Humphrey OTR/L [phone]

## 2016-01-11 NOTE — Discharge Instructions (Signed)

## 2016-01-11 NOTE — Progress Notes (Signed)
Physical Therapy Treatment Patient Details Name: Emily Peck MRN: [id number] DOB: 1956-01-21 Today's Date: 01/11/2016    History of Present Illness 60 yo female admitted on 01/09/16 for right TKA. 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      PT Comments    Pt presents with improved ability to participate in today's therapy session. Requires min guard for majority of gait and transfers and min a from lower surfaces which can be accommodated with Bed side commode over commode at home. Pt has overall improved demeanor and cognitive status which improved her ability to safety return home with prn assistance. Pt would be appropriate to DC home with HHPT services if continues to improve tomorrow.   Follow Up Recommendations  Home health PT     Equipment Recommendations  Rolling walker with 5" wheels;3in1 (PT)    Recommendations for Other Services       Precautions / Restrictions Precautions Precautions: Knee Precaution Booklet Issued: No Precaution Comments: No pillow behind knee Required Braces or Orthoses: Knee Immobilizer - Right Knee Immobilizer - Right: On when out of bed or walking Restrictions Weight Bearing Restrictions: Yes RLE Weight Bearing: Weight bearing as tolerated    Mobility  Bed Mobility Overal bed mobility: Needs Assistance Bed Mobility: Supine to Sit     Supine to sit: Min guard Sit to supine: Min guard   General bed mobility comments: Min guard for assistance with RLE, overall bed mobility was mostly independent  Transfers Overall transfer level: Needs assistance Equipment used: Rolling walker (2 wheeled) Transfers: Sit to/from Stand Sit to Stand: Min guard;Min assist         General transfer comment: Min A from lower commode, Min guard from EOB and low recliner with bilateral hand rails  Ambulation/Gait Ambulation/Gait assistance: Min guard Ambulation Distance (Feet): 30 Feet Assistive device:  Rolling walker (2 wheeled) Gait Pattern/deviations: Step-to pattern;Antalgic Gait velocity: decreased Gait velocity interpretation: Below normal speed for age/gender General Gait Details: Mod antalgic gait from bed to bathroom and bathroom to recliner with cues for proper sequencing   Stairs            Wheelchair Mobility    Modified Rankin (Stroke Patients Only)       Balance Overall balance assessment: Needs assistance Sitting-balance support: Single extremity supported Sitting balance-Leahy Scale: Fair Sitting balance - Comments: balance improved as Pt sat EOB   Standing balance support: Bilateral upper extremity supported Standing balance-Leahy Scale: Poor Standing balance comment: still requires min a for safety with standing                     Cognition Arousal/Alertness: Awake/alert Behavior During Therapy: WFL for tasks assessed/performed Overall Cognitive Status: Within Functional Limits for tasks assessed                      Exercises Total Joint Exercises Quad Sets: AROM;10 reps;Supine;Right Heel Slides: AROM;10 reps;Right;Supine Hip ABduction/ADduction: AROM;10 reps;Right;Supine Straight Leg Raises: AROM;Right;10 reps;Supine Goniometric ROM: 0-70    General Comments        Pertinent Vitals/Pain Pain Assessment: 0-10 Pain Score: 5  Faces Pain Scale: Hurts little more Pain Location: R Knee Pain Descriptors / Indicators: Aching;Guarding Pain Intervention(s): Monitored during session;Repositioned    Home Living                      Prior Function  PT Goals (current goals can now be found in the care plan section) Acute Rehab PT Goals Patient Stated Goal: to get home  Progress towards PT goals: Progressing toward goals    Frequency    7X/week      PT Plan Current plan remains appropriate    Co-evaluation   Reason for Co-Treatment: For patient/therapist safety PT goals addressed during  session: Mobility/safety with mobility OT goals addressed during session: Proper use of Adaptive equipment and DME;ADL's and self-care     End of Session Equipment Utilized During Treatment: Gait belt;Right knee immobilizer Activity Tolerance: Patient limited by fatigue Patient left: in bed;with call bell/phone within reach     Time: [id number] PT Time Calculation (min) (ACUTE ONLY): 36 min  Charges:  $Gait Training: 8-22 mins $Therapeutic Activity: 8-22 mins                    G Codes:      Scheryl Marten PT, DPT  [phone]  01/11/2016, 2:46 PM

## 2016-01-11 NOTE — Progress Notes (Signed)
Orthopedic Tech Progress Note Patient Details:  Emily Peck 07-Nov-1955 [id number]  CPM Right Knee CPM Right Knee: On Right Knee Flexion (Degrees): 70 Right Knee Extension (Degrees): 0 Additional Comments: Pain  Applied CPM at 0-70.   Kristopher Oppenheim 01/11/2016, 5:46 AM

## 2016-01-11 NOTE — Progress Notes (Signed)
[place] for warfarin Indication: VTE prophylaxis  Allergies  Allergen Reactions  . No Known Allergies     Patient Measurements: Height: 5\' 5"  (165.1 cm) Weight: 209 lb (94.8 kg) IBW/kg (Calculated) : 57   Vital Signs: Temp: 98.6 F (37 C) (11/09 0744) Temp Source: Oral (11/09 0744) BP: 126/85 (11/09 0744) Pulse Rate: 82 (11/09 0744)  Labs:  Recent Labs  01/09/16 0630 01/09/16 1145 01/10/16 0835 01/11/16 0527  HGB  --  12.3  --   --   HCT  --  37.3  --   --   PLT  --  281  --   --   APTT 27  --   --   --   LABPROT 15.5*  --  16.6* 19.2*  INR 1.22  --  1.33 1.59  CREATININE  --  0.76  --   --     Estimated Creatinine Clearance: 83 mL/min (by C-G formula based on SCr of 0.76 mg/dL).   Medical History: Past Medical History:  Diagnosis Date  . Anxiety   . Clotting disorder ([place])   . Depression   . Dyspnea   . GERD (gastroesophageal reflux disease)   . Headache   . Lumbosacral spondylosis without myelopathy   . Neuropathy ([place])   . Postlaminectomy syndrome, lumbar region   . Postlaminectomy syndrome, thoracic region   . 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 History of fluctuating INRs but has declined other anticoagulation options in past per chart review. Pharmacy consulted to resume warfarin 11/7. Also started on Lovenox prophylaxis per MD. INR 1.22 at baseline, now up 1.59 after 2 doses. CBC wnl, no bleed documented.  PTA dose: 6mg  except 9mg  on MWF (last dose PTA 11/3)  Goal of Therapy:  INR 2-3 Monitor platelets by anticoagulation protocol: Yes   Plan:  Warfarin 9mg  PO x 1 dose tonight Lovenox 30mg  [place] q12h per MD - d/c when INR at least 1.8 per order Daily INR Monitor for s/sx bleeding   Emily Peck, PharmD, BCPS Clinical Pharmacist 01/11/2016 9:48 AM

## 2016-01-11 NOTE — Progress Notes (Signed)
Orthopedic Tech Progress Note Patient Details:  Emily Peck [birth year] [id number]  CPM Right Knee CPM Right Knee: On Right Knee Flexion (Degrees): 70 Right Knee Extension (Degrees): 0 Additional Comments: Pain   Emily Peck 01/11/2016, 3:56 PM

## 2016-01-11 NOTE — Progress Notes (Signed)
Physical Therapy Treatment Patient Details Name: TODD GAMBEL MRN: [id number] DOB: 01/11/1956 Today's Date: 01/11/2016    History of Present Illness 60 yo female admitted on 01/09/16 for right TKA. 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      PT Comments    Pt presents with improved demeanor and participation this Am. Visit performed as a co-treat for safety due to need for increased assistance last two therapy sessions. Pt with improved gait speed and upright posture with all mobility throughout room. Pt still needs assistance with gait and transfers at this time and unsure if it is safe to DC home unless family is available to assist initially. SNF placement for rehab may still be patient's best option at this time.    Follow Up Recommendations  Home health PT  [facility]    Equipment Recommendations  Rolling walker with 5" wheels;3in1 (PT)    Recommendations for Other Services       Precautions / Restrictions Precautions Precautions: Knee Precaution Booklet Issued: No Precaution Comments: No pillow behind knee Required Braces or Orthoses: Knee Immobilizer - Right Knee Immobilizer - Right: On when out of bed or walking Restrictions Weight Bearing Restrictions: Yes RLE Weight Bearing: Weight bearing as tolerated    Mobility  Bed Mobility Overal bed mobility: Needs Assistance Bed Mobility: Supine to Sit     Supine to sit: Min guard;Min assist     General bed mobility comments: min A with trunk to upright (largely due to bed not able to go completely flat)  Transfers Overall transfer level: Needs assistance Equipment used: Rolling walker (2 wheeled) Transfers: Sit to/from Stand Sit to Stand: Min assist         General transfer comment: min A with verbal cues for safe hand placement  Ambulation/Gait Ambulation/Gait assistance: Min assist Ambulation Distance (Feet): 30 Feet Assistive device: Rolling walker (2  wheeled) Gait Pattern/deviations: Step-to pattern;Decreased step length - left;Decreased stance time - right;Antalgic Gait velocity: decreased Gait velocity interpretation: <1.8 ft/sec, indicative of risk for recurrent falls General Gait Details: Mod antalgic gait from bed to bathroom and bathroom to recliner with cues for proper sequencing   Stairs            Wheelchair Mobility    Modified Rankin (Stroke Patients Only)       Balance Overall balance assessment: Needs assistance Sitting-balance support: Single extremity supported;Feet supported Sitting balance-Leahy Scale: Poor Sitting balance - Comments: balance improved as Pt sat EOB   Standing balance support: Bilateral upper extremity supported;No upper extremity supported;During functional activity Standing balance-Leahy Scale: Poor Standing balance comment: when doing ADL at sink, Pt used BUE support on elbows                    Cognition Arousal/Alertness: Awake/alert Behavior During Therapy: WFL for tasks assessed/performed Overall Cognitive Status: Within Functional Limits for tasks assessed                      Exercises Total Joint Exercises Hip ABduction/ADduction: AROM;10 reps;Right;Supine Straight Leg Raises: AROM;Right;10 reps;Supine    General Comments        Pertinent Vitals/Pain Pain Assessment: 0-10 Pain Score: 6  Faces Pain Scale: Hurts little more Pain Location: right knee Pain Descriptors / Indicators: Aching;Grimacing Pain Intervention(s): Limited activity within patient's tolerance;Monitored during session;Repositioned;Ice applied    Home Living  Prior Function            PT Goals (current goals can now be found in the care plan section) Acute Rehab PT Goals Patient Stated Goal: to get home  Progress towards PT goals: Progressing toward goals    Frequency    7X/week      PT Plan Current plan remains appropriate     Co-evaluation   Reason for Co-Treatment: For patient/therapist safety PT goals addressed during session: Mobility/safety with mobility OT goals addressed during session: Proper use of Adaptive equipment and DME;ADL's and self-care     End of Session Equipment Utilized During Treatment: Gait belt Activity Tolerance: Patient limited by fatigue Patient left: in chair;with call bell/phone within reach     Time: 0928-1002 PT Time Calculation (min) (ACUTE ONLY): 34 min  Charges:  $Gait Training: 8-22 mins                    G Codes:      Scheryl Marten PT, DPT  [phone]  01/11/2016, 1:27 PM

## 2016-01-11 NOTE — Progress Notes (Signed)
Subjective: 2 Days Post-Op Procedure(s) (LRB): TOTAL KNEE ARTHROPLASTY (Right)   Patient resting comfortably in bed. She feels good. Her INR is 1.59  Activity level:  wbat Diet tolerance:  ok Voiding:  ok Patient reports pain as mild.    Objective: Vital signs in last 24 hours: Temp:  [98.6 F (37 C)-98.7 F (37.1 C)] 98.6 F (37 C) (11/09 0744) Pulse Rate:  [82-94] 82 (11/09 0744) Resp:  [18] 18 (11/09 0744) BP: (126-141)/(64-85) 126/85 (11/09 0744) SpO2:  [100 %] 100 % (11/09 0744)  Labs:  Recent Labs  01/09/16 1145  HGB 12.3    Recent Labs  01/09/16 1145  WBC 11.9*  RBC 3.79*  HCT 37.3  PLT 281    Recent Labs  01/09/16 1145  CREATININE 0.76    Recent Labs  01/10/16 0835 01/11/16 0527  INR 1.33 1.59    Physical Exam:  Neurologically intact ABD soft Neurovascular intact Sensation intact distally Intact pulses distally Dorsiflexion/Plantar flexion intact Incision: dressing C/D/I and no drainage No cellulitis present Compartment soft  Assessment/Plan:  2 Days Post-Op Procedure(s) (LRB): TOTAL KNEE ARTHROPLASTY (Right) Advance diet Up with therapy Plan for discharge tomorrow Discharge home with home health if doing well and cleared by PT. Follow up in office 2 weeks post op. Continue on home coumdin. Her INR is 1.59 so we will wait until tomorrow to send her home when she is back to theraputic levels or very close. We changed bandage to aquacel today.  Ronnae Kaser, [name] 01/11/2016, 12:40 PM

## 2016-01-12 LAB — PROTIME-INR
INR: 2.22
Prothrombin Time: 25 seconds — ABNORMAL HIGH (ref 11.4–15.2)

## 2016-01-12 NOTE — Progress Notes (Signed)
Orthopedic Tech Progress Note Patient Details:  Emily Peck 05/26/1955 [id number]  Patient ID: [name], female   DOB: 1955/03/11, 61 y.o.   MRN: [id number] Pt refused cpm.  Karolee Stamps 01/12/2016, 6:13 AM

## 2016-01-12 NOTE — Progress Notes (Signed)
Subjective: 3 Days Post-Op Procedure(s) (LRB): TOTAL KNEE ARTHROPLASTY (Right)   Patient is doing great this morning. She is sitting up eating and her INR is back in therapeutic range.  Activity level:  wbat Diet tolerance:  ok Voiding:  ok Patient reports pain as mild.    Objective: Vital signs in last 24 hours: Temp:  [98.9 F (37.2 C)-99 F (37.2 C)] 99 F (37.2 C) (11/10 0655) Pulse Rate:  [85-90] 85 (11/10 0655) Resp:  [16] 16 (11/10 0655) BP: (117-120)/(61-62) 117/62 (11/10 0655) SpO2:  [100 %] 100 % (11/10 0655)  Labs:  Recent Labs  01/09/16 1145  HGB 12.3    Recent Labs  01/09/16 1145  WBC 11.9*  RBC 3.79*  HCT 37.3  PLT 281    Recent Labs  01/09/16 1145  CREATININE 0.76    Recent Labs  01/11/16 0527 01/12/16 0221  INR 1.59 2.22    Physical Exam:  Neurologically intact ABD soft Neurovascular intact Sensation intact distally Intact pulses distally Dorsiflexion/Plantar flexion intact Incision: dressing C/D/I and no drainage No cellulitis present Compartment soft  Assessment/Plan:  3 Days Post-Op Procedure(s) (LRB): TOTAL KNEE ARTHROPLASTY (Right) Advance diet Up with therapy Discharge home with home health today after PT and once her sons are able to pick her up. Continue on home coumadin. Follow up in office 2 weeks post op.  Tamiyah Moulin, [name] 01/12/2016, 7:55 AM

## 2016-01-12 NOTE — Progress Notes (Signed)
Discharge instructions and prescriptions given to patient. Patient stated understanding. IV removed. Patient is waiting on her son for transportation Emily Peck A Archivist, Therapist, sports

## 2016-01-12 NOTE — Progress Notes (Signed)
Physical Therapy Treatment Patient Details Name: Emily Peck MRN: [id number] DOB: [birth year] Today's Date: 01/12/2016    History of Present Illness 60 yo female admitted on 01/09/16 for right TKA. 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      PT Comments    Patient tolerated gait and stair training this session. Continue to progress as tolerated with anticipated d/c home with HHPT.   Follow Up Recommendations  Home health PT     Equipment Recommendations  Rolling walker with 5" wheels;3in1 (PT)    Recommendations for Other Services       Precautions / Restrictions Precautions Precautions: Knee Precaution Booklet Issued: No Precaution Comments: No pillow behind knee Required Braces or Orthoses: Knee Immobilizer - Right Knee Immobilizer - Right: On when out of bed or walking Restrictions Weight Bearing Restrictions: Yes RLE Weight Bearing: Weight bearing as tolerated    Mobility  Bed Mobility Overal bed mobility: Needs Assistance Bed Mobility: Sit to Supine       Sit to supine: Min assist   General bed mobility comments: assist to elevate R LE into bed and cues for technique  Transfers Overall transfer level: Needs assistance Equipment used: Rolling walker (2 wheeled) Transfers: Sit to/from Stand Sit to Stand: Min guard         General transfer comment: min guard for safety; cues for hand placement  Ambulation/Gait Ambulation/Gait assistance: Min guard Ambulation Distance (Feet): 100 Feet Assistive device: Rolling walker (2 wheeled) Gait Pattern/deviations: Step-through pattern;Decreased stance time - right;Decreased step length - left;Decreased weight shift to right;Antalgic Gait velocity: decreased   General Gait Details: cues for posture, R heel strike, and increased R knee flexion during swing phase   Stairs Stairs: Yes Stairs assistance: Min assist Stair Management: One rail Right;Sideways;Step to  pattern Number of Stairs: 3 General stair comments: cues for sequencing and technique; assist to steady; can use ramp at home  Wheelchair Mobility    Modified Rankin (Stroke Patients Only)       Balance     Sitting balance-Leahy Scale: Good       Standing balance-Leahy Scale: Poor                      Cognition Arousal/Alertness: Awake/alert Behavior During Therapy: WFL for tasks assessed/performed Overall Cognitive Status: Within Functional Limits for tasks assessed                      Exercises      General Comments        Pertinent Vitals/Pain Pain Assessment: 0-10 Pain Score: 6  Pain Location: R knee Pain Descriptors / Indicators: Aching;Sore Pain Intervention(s): Limited activity within patient's tolerance;Monitored during session;Repositioned;Patient requesting pain meds-RN notified;Ice applied    Home Living                      Prior Function            PT Goals (current goals can now be found in the care plan section) Acute Rehab PT Goals Patient Stated Goal: to get home  Progress towards PT goals: Progressing toward goals    Frequency    7X/week      PT Plan Current plan remains appropriate    Co-evaluation             End of Session Equipment Utilized During Treatment: Gait belt Activity Tolerance: Patient tolerated treatment well Patient  left: with call bell/phone within reach;in bed     Time: 1351-1421 PT Time Calculation (min) (ACUTE ONLY): 30 min  Charges:  $Gait Training: 8-22 mins $Therapeutic Activity: 8-22 mins                    G Codes:      [place]ina April, PTA Pager: [phone]   01/12/2016, 2:35 PM

## 2016-01-12 NOTE — Discharge Summary (Signed)
Patient ID: Emily Peck MRN: [id number] DOB/AGE: [birth year] 62 y.o.  Admit date: 01/09/2016 Discharge date: 01/12/2016  Admission Diagnoses:  Principal Problem:   Primary osteoarthritis of right knee   Discharge Diagnoses:  Same  Past Medical History:  Diagnosis Date  . Anxiety   . Clotting disorder ([place])   . Depression   . Dyspnea   . GERD (gastroesophageal reflux disease)   . Headache   . Lumbosacral spondylosis without myelopathy   . Neuropathy ([place])   . Postlaminectomy syndrome, lumbar region   . Postlaminectomy syndrome, thoracic region   . Stroke [facility])     Surgeries: Procedure(s): TOTAL KNEE ARTHROPLASTY on 01/09/2016   Consultants:   Discharged Condition: Improved  Hospital Course: Emily Peck is an 60 y.o. female who was admitted 01/09/2016 for operative treatment ofPrimary osteoarthritis of right knee. Patient has severe unremitting pain that affects sleep, daily activities, and work/hobbies. After pre-op clearance the patient was taken to the operating room on 01/09/2016 and underwent  Procedure(s): TOTAL KNEE ARTHROPLASTY.    Patient was given perioperative antibiotics: Anti-infectives    Start     Dose/Rate Route Frequency Ordered Stop   01/09/16 1400  ceFAZolin (ANCEF) IVPB 2g/100 mL premix     2 g 200 mL/hr over 30 Minutes Intravenous Every 6 hours 01/09/16 1135 01/09/16 2336   01/09/16 0525  ceFAZolin (ANCEF) IVPB 2g/100 mL premix     2 g 200 mL/hr over 30 Minutes Intravenous On call to O.R. 01/09/16 0525 01/09/16 0754       Patient was given sequential compression devices, early ambulation, and chemoprophylaxis to prevent DVT.  Patient benefited maximally from hospital stay and there were no complications.    Recent vital signs: Patient Vitals for the past 24 hrs:  BP Temp Temp src Pulse Resp SpO2  01/12/16 0655 117/62 99 F (37.2 C) Oral 85 16 100 %  01/11/16 2220 120/61 98.9 F (37.2 C) Oral 90 16 100 %     Recent laboratory  studies:  Recent Labs  01/09/16 1145  01/11/16 0527 01/12/16 0221  WBC 11.9*  --   --   --   HGB 12.3  --   --   --   HCT 37.3  --   --   --   PLT 281  --   --   --   CREATININE 0.76  --   --   --   INR  --   < > 1.59 2.22  < > = values in this interval not displayed.   Discharge Medications:     Medication List    TAKE these medications   acetaminophen-codeine 300-60 MG tablet Commonly known as:  TYLENOL #4 Take 1 tablet by mouth 4 (four) times daily as needed.   aspirin-acetaminophen-caffeine 250-250-65 MG tablet Commonly known as:  EXCEDRIN MIGRAINE Take 2 tablets by mouth daily as needed for migraine.   escitalopram 20 MG tablet Commonly known as:  LEXAPRO Take 20 mg by mouth at bedtime.   furosemide 20 MG tablet Commonly known as:  LASIX Take 20 mg by mouth daily as needed for fluid or edema.   gabapentin 600 MG tablet Commonly known as:  NEURONTIN Take 2 tablets (1,200 mg total) by mouth 3 (three) times daily. What changed:  how much to take  when to take this   HYDROcodone-acetaminophen 5-325 MG tablet Commonly known as:  NORCO/VICODIN Take 1-2 tablets by mouth every 4 (four) hours as needed (breakthrough pain).  methocarbamol 500 MG tablet Commonly known as:  ROBAXIN Take 1 tablet (500 mg total) by mouth every 6 (six) hours as needed for muscle spasms. What changed:  when to take this   potassium chloride 10 MEQ tablet Commonly known as:  K-DUR Take 10 mEq by mouth 2 (two) times daily.   topiramate 200 MG tablet Commonly known as:  TOPAMAX Take 200 mg by mouth at bedtime.   traZODone 150 MG tablet Commonly known as:  DESYREL Take 150 mg by mouth at bedtime.   Vitamin D (Ergocalciferol) 50000 units Caps capsule Commonly known as:  DRISDOL Take 50,000 Units by mouth every 7 (seven) days.   warfarin 6 MG tablet Commonly known as:  COUMADIN Take 6-9 mg by mouth daily. Take 9mg  Monday, Wednesday, and Fridays, then all other days take 6mg s  daily            Durable Medical Equipment        Start     Ordered   01/09/16 1136  DME Walker rolling  Once     01/09/16 1135   01/09/16 1136  DME 3 n 1  Once     01/09/16 1135   01/09/16 1136  DME Bedside commode  Once     01/09/16 1135      Diagnostic Studies: Dg Chest 2 View  Result Date: 12/29/2015 CLINICAL DATA:  Preop right knee replacement 01/09/2016. EXAM: CHEST  2 VIEW COMPARISON:  04/10/2015 and chest CT 12/29/2015 FINDINGS: Lungs are hypoinflated without consolidation or effusion. Cardiomediastinal silhouette is within normal. There is calcified plaque over the aortic arch. There are degenerative changes of the spine. Posterior fusion hardware is intact and unchanged over the thoracolumbar spine with postsurgical changes over the lower thoracic spine which are stable. IMPRESSION: Hypoinflation without acute cardiopulmonary disease. Aortic atherosclerosis. Stable postsurgical change of the thoracolumbar spine with fusion hardware intact. Electronically Signed   By: Marin Olp M.D.   On: 12/29/2015 15:34   Ct Angio Chest Pe W Or Wo Contrast  Result Date: 12/29/2015 CLINICAL DATA:  Shortness of breath on exertion. Bilateral lower extremity edema. Evaluate for pulmonary embolism EXAM: CT ANGIOGRAPHY CHEST WITH CONTRAST TECHNIQUE: Multidetector CT imaging of the chest was performed using the standard protocol during bolus administration of intravenous contrast. Multiplanar CT image reconstructions and MIPs were obtained to evaluate the vascular anatomy. CONTRAST:  100 cc Isovue 370. Creatinine was obtained on site at [facility] at [address]Results: Creatinine 0.8 mg/dL. COMPARISON:  Chest radiograph - 04/10/2015. FINDINGS: Cardiovascular: There is adequate opacification of the pulmonary arterial system with the main pulmonary artery measuring 349 Hounsfield units. There are no discrete filling defects within the pulmonary arterial tree to the level the  bilateral subsegmental pulmonary arteries. Normal caliber of the main pulmonary artery. Borderline cardiomegaly. No pericardial effusion. Normal caliber of the thoracic aorta. No definite thoracic aortic dissection on this nongated examination. Conventional configuration of the aortic arch. Mediastinum/Nodes: No mediastinal, hilar axillary lymphadenopathy. Lungs/Pleura: Minimal subsegmental atelectasis within the left lower lobe. No focal airspace opacities. No pleural effusion or pneumothorax. The central pulmonary airways appear widely patent. No discrete pulmonary nodules. Upper Abdomen: Limited early arterial phase evaluation of the upper abdomen is unremarkable. Note is made of a replaced right hepatic artery arising from the proximal SMA Musculoskeletal: No acute or aggressive osseous abnormalities. Post long segment paraspinal fusion extending from at least T8 through T12, though the caudal extent of the hardware is not image. Osseous fusion  with bone graft material extending from T9 through T11. No definite evidence of hardware failure or loosening. Postoperative change involving the posterior aspects of the left ninth, tenth and eleventh ribs. Regional soft tissues appear normal. Review of the MIP images confirms the above findings. IMPRESSION: 1. No acute cardiopulmonary disease. Specifically, no evidence of pulmonary embolism. 2. Borderline cardiomegaly without evidence of edema. Electronically Signed   By: Sandi Mariscal M.D.   On: 12/29/2015 09:34   US Venous Img Lower Bilateral  Result Date: 12/29/2015 CLINICAL DATA:  Bilateral lower extremity pain and edema for the past 3 months. Evaluate for DVT. EXAM: BILATERAL LOWER EXTREMITY VENOUS DOPPLER ULTRASOUND TECHNIQUE: Gray-scale sonography with graded compression, as well as color Doppler and duplex ultrasound were performed to evaluate the lower extremity deep venous systems from the level of the common femoral vein and including the common femoral,  femoral, profunda femoral, popliteal and calf veins including the posterior tibial, peroneal and gastrocnemius veins when visible. The superficial great saphenous vein was also interrogated. Spectral Doppler was utilized to evaluate flow at rest and with distal augmentation maneuvers in the common femoral, femoral and popliteal veins. COMPARISON:  None. FINDINGS: RIGHT LOWER EXTREMITY Common Femoral Vein: No evidence of thrombus. Normal compressibility, respiratory phasicity and response to augmentation. Saphenofemoral Junction: No evidence of thrombus. Normal compressibility and flow on color Doppler imaging. Profunda Femoral Vein: No evidence of thrombus. Normal compressibility and flow on color Doppler imaging. Femoral Vein: No evidence of thrombus. Normal compressibility, respiratory phasicity and response to augmentation. Popliteal Vein: No evidence of thrombus. Normal compressibility, respiratory phasicity and response to augmentation. Calf Veins: No evidence of thrombus. Normal compressibility and flow on color Doppler imaging. Superficial Great Saphenous Vein: No evidence of thrombus. Normal compressibility and flow on color Doppler imaging. Venous Reflux:  None. Other Findings:  None. LEFT LOWER EXTREMITY Common Femoral Vein: No evidence of thrombus. Normal compressibility, respiratory phasicity and response to augmentation. Saphenofemoral Junction: No evidence of thrombus. Normal compressibility and flow on color Doppler imaging. Profunda Femoral Vein: No evidence of thrombus. Normal compressibility and flow on color Doppler imaging. Femoral Vein: No evidence of thrombus. Normal compressibility, respiratory phasicity and response to augmentation. Popliteal Vein: No evidence of thrombus. Normal compressibility, respiratory phasicity and response to augmentation. Calf Veins: No evidence of thrombus. Normal compressibility and flow on color Doppler imaging. Superficial Great Saphenous Vein: No evidence of  thrombus. Normal compressibility and flow on color Doppler imaging. Venous Reflux:  None. Other Findings:  None. IMPRESSION: No evidence of DVT within either lower extremity. Electronically Signed   By: Sandi Mariscal M.D.   On: 12/29/2015 10:26    Disposition: 01-Home or Self Care  Discharge Instructions    Call MD / Call 911    Complete by:  As directed    If you experience chest pain or shortness of breath, CALL 911 and be transported to the hospital emergency room.  If you develope a fever above 101 F, pus (white drainage) or increased drainage or redness at the wound, or calf pain, call your surgeon's office.   Constipation Prevention    Complete by:  As directed    Drink plenty of fluids.  Prune juice may be helpful.  You may use a stool softener, such as Colace (over the counter) 100 mg twice a day.  Use MiraLax (over the counter) for constipation as needed.   Diet - low sodium heart healthy    Complete by:  As directed  Discharge instructions    Complete by:  As directed    INSTRUCTIONS AFTER JOINT REPLACEMENT   Remove items at home which could result in a fall. This includes throw rugs or furniture in walking pathways ICE to the affected joint every three hours while awake for 30 minutes at a time, for at least the first 3-5 days, and then as needed for pain and swelling.  Continue to use ice for pain and swelling. You may notice swelling that will progress down to the foot and ankle.  This is normal after surgery.  Elevate your leg when you are not up walking on it.   Continue to use the breathing machine you got in the hospital (incentive spirometer) which will help keep your temperature down.  It is common for your temperature to cycle up and down following surgery, especially at night when you are not up moving around and exerting yourself.  The breathing machine keeps your lungs expanded and your temperature down.   DIET:  As you were doing prior to hospitalization, we recommend a  well-balanced diet.  DRESSING / WOUND CARE / SHOWERING  You may shower 3 days after surgery, but keep the wounds dry during showering.  You may use an occlusive plastic wrap (Press'n Seal for example), NO SOAKING/SUBMERGING IN THE BATHTUB.  If the bandage gets wet, change with a clean dry gauze.  If the incision gets wet, pat the wound dry with a clean towel.  ACTIVITY  Increase activity slowly as tolerated, but follow the weight bearing instructions below.   No driving for 6 weeks or until further direction given by your physician.  You cannot drive while taking narcotics.  No lifting or carrying greater than 10 lbs. until further directed by your surgeon. Avoid periods of inactivity such as sitting longer than an hour when not asleep. This helps prevent blood clots.  You may return to work once you are authorized by your doctor.     WEIGHT BEARING   Weight bearing as tolerated with assist device (walker, cane, etc) as directed, use it as long as suggested by your surgeon or therapist, typically at least 4-6 weeks.   EXERCISES  Results after joint replacement surgery are often greatly improved when you follow the exercise, range of motion and muscle strengthening exercises prescribed by your doctor. Safety measures are also important to protect the joint from further injury. Any time any of these exercises cause you to have increased pain or swelling, decrease what you are doing until you are comfortable again and then slowly increase them. If you have problems or questions, call your caregiver or physical therapist for advice.   Rehabilitation is important following a joint replacement. After just a few days of immobilization, the muscles of the leg can become weakened and shrink (atrophy).  These exercises are designed to build up the tone and strength of the thigh and leg muscles and to improve motion. Often times heat used for twenty to thirty minutes before working out will loosen up  your tissues and help with improving the range of motion but do not use heat for the first two weeks following surgery (sometimes heat can increase post-operative swelling).   These exercises can be done on a training (exercise) mat, on the floor, on a table or on a bed. Use whatever works the best and is most comfortable for you.    Use music or television while you are exercising so that the exercises are a pleasant  break in your day. This will make your life better with the exercises acting as a break in your routine that you can look forward to.   Perform all exercises about fifteen times, three times per day or as directed.  You should exercise both the operative leg and the other leg as well.   Exercises include:   Quad Sets - Tighten up the muscle on the front of the thigh (Quad) and hold for 5-10 seconds.   Straight Leg Raises - With your knee straight (if you were given a brace, keep it on), lift the leg to 60 degrees, hold for 3 seconds, and slowly lower the leg.  Perform this exercise against resistance later as your leg gets stronger.  Leg Slides: Lying on your back, slowly slide your foot toward your buttocks, bending your knee up off the floor (only go as far as is comfortable). Then slowly slide your foot back down until your leg is flat on the floor again.  Angel Wings: Lying on your back spread your legs to the side as far apart as you can without causing discomfort.  Hamstring Strength:  Lying on your back, push your heel against the floor with your leg straight by tightening up the muscles of your buttocks.  Repeat, but this time bend your knee to a comfortable angle, and push your heel against the floor.  You may put a pillow under the heel to make it more comfortable if necessary.   A rehabilitation program following joint replacement surgery can speed recovery and prevent re-injury in the future due to weakened muscles. Contact your doctor or a physical therapist for more  information on knee rehabilitation.    CONSTIPATION  Constipation is defined medically as fewer than three stools per week and severe constipation as less than one stool per week.  Even if you have a regular bowel pattern at home, your normal regimen is likely to be disrupted due to multiple reasons following surgery.  Combination of anesthesia, postoperative narcotics, change in appetite and fluid intake all can affect your bowels.   YOU MUST use at least one of the following options; they are listed in order of increasing strength to get the job done.  They are all available over the counter, and you may need to use some, POSSIBLY even all of these options:    Drink plenty of fluids (prune juice may be helpful) and high fiber foods Colace 100 mg by mouth twice a day  Senokot for constipation as directed and as needed Dulcolax (bisacodyl), take with full glass of water  Miralax (polyethylene glycol) once or twice a day as needed.  If you have tried all these things and are unable to have a bowel movement in the first 3-4 days after surgery call either your surgeon or your primary doctor.    If you experience loose stools or diarrhea, hold the medications until you stool forms back up.  If your symptoms do not get better within 1 week or if they get worse, check with your doctor.  If you experience "the worst abdominal pain ever" or develop nausea or vomiting, please contact the office immediately for further recommendations for treatment.   ITCHING:  If you experience itching with your medications, try taking only a single pain pill, or even half a pain pill at a time.  You can also use Benadryl over the counter for itching or also to help with sleep.   TED HOSE STOCKINGS:  Use  stockings on both legs until for at least 2 weeks or as directed by physician office. They may be removed at night for sleeping.  MEDICATIONS:  See your medication summary on the "After Visit Summary" that nursing will  review with you.  You may have some home medications which will be placed on hold until you complete the course of blood thinner medication.  It is important for you to complete the blood thinner medication as prescribed.  PRECAUTIONS:  If you experience chest pain or shortness of breath - call 911 immediately for transfer to the hospital emergency department.   If you develop a fever greater that 101 F, purulent drainage from wound, increased redness or drainage from wound, foul odor from the wound/dressing, or calf pain - CONTACT YOUR SURGEON.                                                   FOLLOW-UP APPOINTMENTS:  If you do not already have a post-op appointment, please call the office for an appointment to be seen by your surgeon.  Guidelines for how soon to be seen are listed in your "After Visit Summary", but are typically between 1-4 weeks after surgery.  OTHER INSTRUCTIONS:   Knee Replacement:  Do not place pillow under knee, focus on keeping the knee straight while resting. CPM instructions: 0-90 degrees, 2 hours in the morning, 2 hours in the afternoon, and 2 hours in the evening. Place foam block, curve side up under heel at all times except when in CPM or when walking.  DO NOT modify, tear, cut, or change the foam block in any way.  MAKE SURE YOU:  Understand these instructions.  Get help right away if you are not doing well or get worse.    Thank you for letting us be a part of your medical care team.  It is a privilege we respect greatly.  We hope these instructions will help you stay on track for a fast and full recovery!   Increase activity slowly as tolerated    Complete by:  As directed       Follow-up Information    DALLDORF,PETER G, MD. Schedule an appointment as soon as possible for a visit in 2 weeks.   Specialty:  Orthopedic Surgery Contact information: [place] [address] [phone]            Signed: Rich Fuchs 01/12/2016,  7:55 AM

## 2016-01-12 NOTE — Progress Notes (Signed)
Occupational Therapy Treatment Patient Details Name: Emily Peck MRN: [id number] DOB: 10/30/1955 Today's Date: 01/12/2016    History of present illness 60 yo female admitted on 01/09/16 for right TKA. 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     OT comments  Pt continues to make progress towards OT goals. Pt received education (including handout) on tub transfer using 3 in 1. Pt with no further questions or concerns about self care at home, and is at adequate level for d/c with HHOT and intermittent supervision.   Follow Up Recommendations  Home health OT;Supervision - Intermittent    Equipment Recommendations  3 in 1 bedside comode    Recommendations for Other Services      Precautions / Restrictions Precautions Precautions: Knee Precaution Booklet Issued: No Precaution Comments: No pillow behind knee Required Braces or Orthoses: Knee Immobilizer - Right Knee Immobilizer - Right: On when out of bed or walking Restrictions Weight Bearing Restrictions: Yes RLE Weight Bearing: Weight bearing as tolerated       Mobility Bed Mobility Overal bed mobility: Needs Assistance Bed Mobility: Supine to Sit;Sit to Supine     Supine to sit: Min guard Sit to supine: Min assist   General bed mobility comments: assist to elevate R LE into bed and cues for technique  Transfers Overall transfer level: Needs assistance Equipment used: Rolling walker (2 wheeled) Transfers: Sit to/from Stand Sit to Stand: Min guard         General transfer comment: vc for hand placement, min guard for safety "I got this"    Balance Overall balance assessment: Needs assistance Sitting-balance support: No upper extremity supported;Feet supported Sitting balance-Leahy Scale: Good     Standing balance support: Bilateral upper extremity supported;During functional activity;Single extremity supported Standing balance-Leahy Scale: Poor                      ADL Overall ADL's : Needs assistance/impaired Eating/Feeding: Modified independent;Sitting;Bed level;Set up Eating/Feeding Details (indicate cue type and reason): increased light and explanation of what things are on tray                             Tub/ Shower Transfer: Tub transfer;Minimal assistance;3 in 1;Rolling walker;Ambulation;Cueing for sequencing Tub/Shower Transfer Details (indicate cue type and reason): Pt provided with handout as well with pictures step by step instructions Functional mobility during ADLs: Min guard;Minimal assistance;Cueing for safety;Rolling walker General ADL Comments: Pt with no questions or concerns going home in ADL, Pt says she is ready to be home in her own environment      Vision                     Perception     Praxis      Cognition   Behavior During Therapy: [facility] for tasks assessed/performed Overall Cognitive Status: Within Functional Limits for tasks assessed                       Extremity/Trunk Assessment               Exercises     Shoulder Instructions       General Comments      Pertinent Vitals/ Pain       Pain Assessment: 0-10 Pain Score: 5  Pain Location: R knee Pain Descriptors / Indicators: Aching;Grimacing;Sore Pain Intervention(s): Limited activity within  patient's tolerance;Monitored during session;Repositioned;Ice applied  Home Living                                          Prior Functioning/Environment              Frequency  Min 2X/week        Progress Toward Goals  OT Goals(current goals can now be found in the care plan section)  Progress towards OT goals: Progressing toward goals  Acute Rehab OT Goals Patient Stated Goal: to get home  OT Goal Formulation: With patient Time For Goal Achievement: 01/17/16 Potential to Achieve Goals: Good  Plan Discharge plan remains appropriate    Co-evaluation                  End of Session Equipment Utilized During Treatment: Gait belt;Rolling walker;Right knee immobilizer   Activity Tolerance Patient tolerated treatment well   Patient Left in bed;with call bell/phone within reach   Nurse Communication Mobility status        Time: [id number] OT Time Calculation (min): 19 min  Charges: OT General Charges $OT Visit: 1 Procedure OT Treatments $Self Care/Home Management : 8-22 mins  Merri Ray Faust [place] 01/12/2016, 3:36 PM Hulda Humphrey OTR/L [phone]

## 2016-01-12 NOTE — Progress Notes (Signed)
Physical Therapy Treatment Patient Details Name: Emily Peck MRN: [id number] DOB: 26-Apr-1955 Today's Date: 01/12/2016    History of Present Illness 60 yo female admitted on 01/09/16 for right TKA. 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      PT Comments    Patient is progressing toward mobility goals. Current plan remains appropriate.   Follow Up Recommendations  Home health PT     Equipment Recommendations  Rolling walker with 5" wheels;3in1 (PT)    Recommendations for Other Services       Precautions / Restrictions Precautions Precautions: Knee Precaution Booklet Issued: No Precaution Comments: No pillow behind knee Required Braces or Orthoses: Knee Immobilizer - Right Knee Immobilizer - Right: On when out of bed or walking Restrictions Weight Bearing Restrictions: Yes RLE Weight Bearing: Weight bearing as tolerated    Mobility  Bed Mobility Overal bed mobility: Needs Assistance Bed Mobility: Sit to Supine       Sit to supine: Min assist   General bed mobility comments: assist to elevate R LE into bed and cues for technique  Transfers Overall transfer level: Needs assistance Equipment used: Rolling walker (2 wheeled) Transfers: Sit to/from Stand Sit to Stand: Min guard;Min assist         General transfer comment: assist to power up into standing from recliner and min guard from [facility]; cues for safe hand placement and use of AD   Ambulation/Gait Ambulation/Gait assistance: Min guard Ambulation Distance (Feet): 90 Feet Assistive device: Rolling walker (2 wheeled) Gait Pattern/deviations: Step-to pattern;Decreased stance time - right;Decreased step length - left;Decreased weight shift to right;Antalgic Gait velocity: decreased   General Gait Details: cues for posture, sequencing, and bilat heel strike   Stairs            Wheelchair Mobility    Modified Rankin (Stroke Patients Only)        Balance     Sitting balance-Leahy Scale: Good       Standing balance-Leahy Scale: Poor                      Cognition Arousal/Alertness: Awake/alert Behavior During Therapy: WFL for tasks assessed/performed Overall Cognitive Status: Within Functional Limits for tasks assessed                      Exercises Total Joint Exercises Quad Sets: AROM;10 reps;Supine;Right Heel Slides: AROM;10 reps;Right;Supine Goniometric ROM: 0-64    General Comments        Pertinent Vitals/Pain Pain Assessment: Faces Faces Pain Scale: Hurts little more Pain Location: R knee Pain Descriptors / Indicators: Aching;Sore;Tightness Pain Intervention(s): Limited activity within patient's tolerance;Monitored during session;Premedicated before session;Repositioned;Ice applied    Home Living                      Prior Function            PT Goals (current goals can now be found in the care plan section) Acute Rehab PT Goals Patient Stated Goal: to get home  Progress towards PT goals: Progressing toward goals    Frequency    7X/week      PT Plan Current plan remains appropriate    Co-evaluation             End of Session Equipment Utilized During Treatment: Gait belt Activity Tolerance: Patient tolerated treatment well Patient left: in bed;with call bell/phone within reach  Time: 0920-1004 PT Time Calculation (min) (ACUTE ONLY): 44 min  Charges:  $Gait Training: 8-22 mins $Therapeutic Exercise: 8-22 mins $Therapeutic Activity: 8-22 mins                    G Codes:      [place] April, PTA Pager: [phone]   01/12/2016, 10:14 AM

## 2016-01-13 DIAGNOSIS — M7061 Trochanteric bursitis, right hip: Secondary | ICD-10-CM | POA: Diagnosis not present

## 2016-01-13 DIAGNOSIS — M2141 Flat foot [pes planus] (acquired), right foot: Secondary | ICD-10-CM | POA: Diagnosis not present

## 2016-01-13 DIAGNOSIS — Z970 Presence of artificial eye: Secondary | ICD-10-CM | POA: Diagnosis not present

## 2016-01-13 DIAGNOSIS — R262 Difficulty in walking, not elsewhere classified: Secondary | ICD-10-CM | POA: Diagnosis not present

## 2016-01-13 DIAGNOSIS — M7062 Trochanteric bursitis, left hip: Secondary | ICD-10-CM | POA: Diagnosis not present

## 2016-01-13 DIAGNOSIS — G5601 Carpal tunnel syndrome, right upper limb: Secondary | ICD-10-CM | POA: Diagnosis not present

## 2016-01-13 DIAGNOSIS — H4422 Degenerative myopia, left eye: Secondary | ICD-10-CM | POA: Diagnosis not present

## 2016-01-13 DIAGNOSIS — F419 Anxiety disorder, unspecified: Secondary | ICD-10-CM | POA: Diagnosis not present

## 2016-01-13 DIAGNOSIS — F329 Major depressive disorder, single episode, unspecified: Secondary | ICD-10-CM | POA: Diagnosis not present

## 2016-01-13 DIAGNOSIS — M2142 Flat foot [pes planus] (acquired), left foot: Secondary | ICD-10-CM | POA: Diagnosis not present

## 2016-01-13 DIAGNOSIS — Z96651 Presence of right artificial knee joint: Secondary | ICD-10-CM | POA: Diagnosis not present

## 2016-01-13 DIAGNOSIS — M961 Postlaminectomy syndrome, not elsewhere classified: Secondary | ICD-10-CM | POA: Diagnosis not present

## 2016-01-13 DIAGNOSIS — Z6833 Body mass index (BMI) 33.0-33.9, adult: Secondary | ICD-10-CM | POA: Diagnosis not present

## 2016-01-13 DIAGNOSIS — G629 Polyneuropathy, unspecified: Secondary | ICD-10-CM | POA: Diagnosis not present

## 2016-01-13 DIAGNOSIS — Z471 Aftercare following joint replacement surgery: Secondary | ICD-10-CM | POA: Diagnosis not present

## 2016-01-15 DIAGNOSIS — M961 Postlaminectomy syndrome, not elsewhere classified: Secondary | ICD-10-CM | POA: Diagnosis not present

## 2016-01-15 DIAGNOSIS — Z471 Aftercare following joint replacement surgery: Secondary | ICD-10-CM | POA: Diagnosis not present

## 2016-01-15 DIAGNOSIS — M7061 Trochanteric bursitis, right hip: Secondary | ICD-10-CM | POA: Diagnosis not present

## 2016-01-15 DIAGNOSIS — M7062 Trochanteric bursitis, left hip: Secondary | ICD-10-CM | POA: Diagnosis not present

## 2016-01-15 DIAGNOSIS — G5601 Carpal tunnel syndrome, right upper limb: Secondary | ICD-10-CM | POA: Diagnosis not present

## 2016-01-15 DIAGNOSIS — R262 Difficulty in walking, not elsewhere classified: Secondary | ICD-10-CM | POA: Diagnosis not present

## 2016-01-16 DIAGNOSIS — M7061 Trochanteric bursitis, right hip: Secondary | ICD-10-CM | POA: Diagnosis not present

## 2016-01-16 DIAGNOSIS — G5601 Carpal tunnel syndrome, right upper limb: Secondary | ICD-10-CM | POA: Diagnosis not present

## 2016-01-16 DIAGNOSIS — M7062 Trochanteric bursitis, left hip: Secondary | ICD-10-CM | POA: Diagnosis not present

## 2016-01-16 DIAGNOSIS — Z7901 Long term (current) use of anticoagulants: Secondary | ICD-10-CM | POA: Diagnosis not present

## 2016-01-16 DIAGNOSIS — M961 Postlaminectomy syndrome, not elsewhere classified: Secondary | ICD-10-CM | POA: Diagnosis not present

## 2016-01-16 DIAGNOSIS — Z471 Aftercare following joint replacement surgery: Secondary | ICD-10-CM | POA: Diagnosis not present

## 2016-01-16 DIAGNOSIS — R262 Difficulty in walking, not elsewhere classified: Secondary | ICD-10-CM | POA: Diagnosis not present

## 2016-01-17 ENCOUNTER — Emergency Department (HOSPITAL_COMMUNITY)
Admission: EM | Admit: 2016-01-17 | Discharge: 2016-01-17 | Disposition: A | Discharge status: Home / Self Care | Payer: Medicare Other | Attending: Emergency Medicine | Admitting: Emergency Medicine

## 2016-01-17 ENCOUNTER — Encounter (HOSPITAL_COMMUNITY): Payer: Self-pay | Admitting: Emergency Medicine

## 2016-01-17 DIAGNOSIS — Z7901 Long term (current) use of anticoagulants: Secondary | ICD-10-CM | POA: Insufficient documentation

## 2016-01-17 DIAGNOSIS — R262 Difficulty in walking, not elsewhere classified: Secondary | ICD-10-CM | POA: Diagnosis not present

## 2016-01-17 DIAGNOSIS — Z8673 Personal history of transient ischemic attack (TIA), and cerebral infarction without residual deficits: Secondary | ICD-10-CM | POA: Insufficient documentation

## 2016-01-17 DIAGNOSIS — Z471 Aftercare following joint replacement surgery: Secondary | ICD-10-CM | POA: Diagnosis not present

## 2016-01-17 DIAGNOSIS — Z79899 Other long term (current) drug therapy: Secondary | ICD-10-CM | POA: Insufficient documentation

## 2016-01-17 DIAGNOSIS — R52 Pain, unspecified: Secondary | ICD-10-CM | POA: Diagnosis not present

## 2016-01-17 DIAGNOSIS — M7061 Trochanteric bursitis, right hip: Secondary | ICD-10-CM | POA: Diagnosis not present

## 2016-01-17 DIAGNOSIS — M961 Postlaminectomy syndrome, not elsewhere classified: Secondary | ICD-10-CM | POA: Diagnosis not present

## 2016-01-17 DIAGNOSIS — G8918 Other acute postprocedural pain: Secondary | ICD-10-CM | POA: Insufficient documentation

## 2016-01-17 DIAGNOSIS — Z7982 Long term (current) use of aspirin: Secondary | ICD-10-CM | POA: Insufficient documentation

## 2016-01-17 DIAGNOSIS — G5601 Carpal tunnel syndrome, right upper limb: Secondary | ICD-10-CM | POA: Diagnosis not present

## 2016-01-17 DIAGNOSIS — M25561 Pain in right knee: Secondary | ICD-10-CM | POA: Diagnosis not present

## 2016-01-17 DIAGNOSIS — Z96651 Presence of right artificial knee joint: Secondary | ICD-10-CM | POA: Diagnosis not present

## 2016-01-17 DIAGNOSIS — M7062 Trochanteric bursitis, left hip: Secondary | ICD-10-CM | POA: Diagnosis not present

## 2016-01-17 MED ORDER — ONDANSETRON 4 MG PO TBDP
8.0000 mg | ORAL_TABLET | Freq: Once | ORAL | Status: AC
  Administered 2016-01-17: 8 mg via ORAL
  Filled 2016-01-17: qty 2

## 2016-01-17 MED ORDER — OXYCODONE-ACETAMINOPHEN 5-325 MG PO TABS
1.0000 | ORAL_TABLET | Freq: Once | ORAL | Status: AC
  Administered 2016-01-17: 1 via ORAL

## 2016-01-17 MED ORDER — HYDROMORPHONE HCL 2 MG/ML IJ SOLN
1.0000 mg | Freq: Once | INTRAMUSCULAR | Status: AC
  Administered 2016-01-17: 1 mg via INTRAMUSCULAR
  Filled 2016-01-17: qty 1

## 2016-01-17 MED ORDER — OXYCODONE-ACETAMINOPHEN 5-325 MG PO TABS
ORAL_TABLET | ORAL | Status: AC
  Filled 2016-01-17: qty 1

## 2016-01-17 NOTE — ED Notes (Signed)
EDP at bedside  

## 2016-01-17 NOTE — ED Triage Notes (Signed)
Pt reports knee pain since Monday, states replacement on the 7th, states has been compliant with post op treatment plan. States RX hydrocodone not effective.

## 2016-01-17 NOTE — ED Provider Notes (Signed)
[facility] DEPT Provider Note   CSN: [id number] Arrival date & time: 01/17/16  0416     History   Chief Complaint Chief Complaint  Patient presents with  . Knee Pain    HPI Emily Peck is a 60 y.o. female.  Patient presents to the emergency department for evaluation of right knee pain. Patient reports that she had right knee replacement one week ago. She has been taking hydrocodone for pain but the pain is not controlled. Patient denies any falls or injury to the knee. She has not taken the dressing off since surgery but has not noticed any drainage.      Past Medical History:  Diagnosis Date  . Anxiety   . Clotting disorder ([place])   . Depression   . Dyspnea   . GERD (gastroesophageal reflux disease)   . Headache   . Lumbosacral spondylosis without myelopathy   . Neuropathy ([place])   . Postlaminectomy syndrome, lumbar region   . Postlaminectomy syndrome, thoracic region   . Stroke [facility])     Patient Active Problem List   Diagnosis Date Noted  . Primary osteoarthritis of right knee 01/09/2016  . Peripheral polyneuropathy ([place]) 10/19/2015  . Knee pain, chronic 06/11/2013  . Thoracic postlaminectomy syndrome 06/11/2013  . Pes planus of both feet 06/11/2013  . Trochanteric bursitis of both hips 11/06/2012  . Lumbosacral spondylosis without myelopathy 05/09/2011  . DEPRESSION 07/24/2006  . CARPAL TUNNEL SYNDROME, RIGHT 07/24/2006  . MYOPIA, 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  . DEGENERATIVE JOINT DISEASE 07/24/2006  . DISORDER, LUMBAR DISC W/MYELOPATHY 07/24/2006  . 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 AVULSION, EYE 12/23/2005    Past Surgical History:  Procedure Laterality Date  . ABDOMINAL HYSTERECTOMY    . BACK SURGERY    . EYE SURGERY     eye removed right   . KNEE ARTHROSCOPY    . 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   Procedure: TOTAL KNEE ARTHROPLASTY;  Surgeon: Melrose Nakayama, MD;  Location: [facility];  Service: Orthopedics;  Laterality:  Right;    OB History    No data available       Home Medications    Prior to Admission medications   Medication Sig Start Date End Date Taking? Authorizing Provider  acetaminophen-codeine (TYLENOL #4) 300-60 MG tablet Take 1 tablet by mouth 4 (four) times daily as needed. Patient taking differently: Take 1 tablet by mouth 4 (four) times daily as needed for moderate pain.  10/19/15  Yes Charlett Blake, MD  aspirin-acetaminophen-caffeine (EXCEDRIN MIGRAINE) [phone] MG tablet Take 2 tablets by mouth daily as needed for migraine.   Yes Historical Provider, MD  escitalopram (LEXAPRO) 20 MG tablet Take 20 mg by mouth at bedtime.  12/13/13  Yes Historical Provider, MD  furosemide (LASIX) 20 MG tablet Take 20 mg by mouth daily as needed for fluid or edema.  11/09/15  Yes Historical Provider, MD  gabapentin (NEURONTIN) 600 MG tablet Take 2 tablets (1,200 mg total) by mouth 3 (three) times daily. Patient taking differently: Take 600 mg by mouth 5 (five) times daily.  10/19/15  Yes Melvenia Beam, MD  HYDROcodone-acetaminophen (NORCO/VICODIN) 5-325 MG tablet Take 1-2 tablets by mouth every 4 (four) hours as needed (breakthrough pain). 01/11/16  Yes Loni Dolly, PA-C  methocarbamol (ROBAXIN) 500 MG tablet Take 1 tablet (500 mg total) by mouth every 6 (six) hours as needed for muscle spasms. 01/11/16  Yes Loni Dolly, PA-C  potassium chloride (K-DUR) 10 MEQ tablet Take 10 mEq  by mouth 2 (two) times daily.   Yes Historical Provider, MD  topiramate (TOPAMAX) 200 MG tablet Take 200 mg by mouth at bedtime.  07/13/14  Yes Historical Provider, MD  traZODone (DESYREL) 150 MG tablet Take 150 mg by mouth at bedtime. 07/13/14  Yes Historical Provider, MD  Vitamin D, Ergocalciferol, (DRISDOL) 50000 units CAPS capsule Take 50,000 Units by mouth every 7 (seven) days.   Yes Historical Provider, MD  warfarin (COUMADIN) 6 MG tablet Take 6-9 mg by mouth daily. Take 9mg  Monday, Wednesday, and Fridays, then all other days  take 6mg s daily   Yes Historical Provider, MD    Family History Family History  Problem Relation Age of Onset  . Cancer Mother   . Diabetes Mother     Social History Social History  Substance Use Topics  . Smoking status: Never Smoker  . Smokeless tobacco: Never Used  . Alcohol use No     Allergies   No known allergies   Review of Systems Review of Systems  Musculoskeletal: Positive for arthralgias.  All other systems reviewed and are negative.    Physical Exam Updated Vital Signs BP 144/96   Pulse 78   Temp 97.3 F (36.3 C) (Oral)   Resp 16   SpO2 100%   Physical Exam  Constitutional: She is oriented to person, place, and time. She appears well-developed and well-nourished. No distress.  HENT:  Head: Normocephalic and atraumatic.  Right Ear: Hearing normal.  Left Ear: Hearing normal.  Nose: Nose normal.  Mouth/Throat: Oropharynx is clear and moist and mucous membranes are normal.  Eyes: Conjunctivae and EOM are normal. Pupils are equal, round, and reactive to light.  Neck: Normal range of motion. Neck supple.  Cardiovascular: Regular rhythm, S1 normal and S2 normal.  Exam reveals no gallop and no friction rub.   No murmur heard. Pulmonary/Chest: Effort normal and breath sounds normal. No respiratory distress. She exhibits no tenderness.  Abdominal: Soft. Normal appearance and bowel sounds are normal. There is no hepatosplenomegaly. There is no tenderness. There is no rebound, no guarding, no tenderness at McBurney's point and negative Murphy's sign. No hernia.  Musculoskeletal:       Right knee: She exhibits decreased range of motion and swelling. She exhibits no erythema. Tenderness found.  Neurological: She is alert and oriented to person, place, and time. She has normal strength. No cranial nerve deficit or sensory deficit. Coordination normal. GCS eye subscore is 4. GCS verbal subscore is 5. GCS motor subscore is 6.  Skin: Skin is warm, dry and intact. No  rash noted. No cyanosis.  Well-healing surgical incision right knee intact, no dehiscence. No erythema and no drainage  Psychiatric: She has a normal mood and affect. Her speech is normal and behavior is normal. Thought content normal.  Nursing note and vitals reviewed.    ED Treatments / Results  Labs (all labs ordered are listed, but only abnormal results are displayed) Labs Reviewed - No data to display  EKG  EKG Interpretation None       Radiology No results found.  Procedures Procedures (including critical care time)  Medications Ordered in ED Medications  oxyCODONE-acetaminophen (PERCOCET/ROXICET) 5-325 MG per tablet 1 tablet (1 tablet Oral Given 01/17/16 0428)  HYDROmorphone (DILAUDID) injection 1 mg (1 mg Intramuscular Given 01/17/16 0608)  ondansetron (ZOFRAN-ODT) disintegrating tablet 8 mg (8 mg Oral Given 01/17/16 0608)     Initial Impression / Assessment and Plan / ED Course  I have reviewed the  triage vital signs and the nursing notes.  Pertinent labs & imaging results that were available during my care of the patient were reviewed by me and considered in my medical decision making (see chart for details).  Clinical Course    Patient presents to the ER for evaluation of knee pain. Patient had right knee replacement one week ago. Pain is centered around the knee replacement area, not in the calf or lower leg. She has palpable pulses. Normal capillary refill. No sign of arterial or venous occlusion. Wound was undressed. Wound is intact, appears to be healing well. There is no erythema. There is no spontaneous drainage and nothing will expressed from the wound. No fluctuance or signs of infection at all. Patient given analgesia here in the ER, is to follow-up with her orthopedic surgeon for further evaluation.  Final Clinical Impressions(s) / ED Diagnoses   Final diagnoses:  Post-operative pain    New Prescriptions New Prescriptions   No medications on file      Orpah Greek, MD 01/17/16 [phone]

## 2016-01-18 DIAGNOSIS — M961 Postlaminectomy syndrome, not elsewhere classified: Secondary | ICD-10-CM | POA: Diagnosis not present

## 2016-01-18 DIAGNOSIS — Z471 Aftercare following joint replacement surgery: Secondary | ICD-10-CM | POA: Diagnosis not present

## 2016-01-18 DIAGNOSIS — G5601 Carpal tunnel syndrome, right upper limb: Secondary | ICD-10-CM | POA: Diagnosis not present

## 2016-01-18 DIAGNOSIS — M7062 Trochanteric bursitis, left hip: Secondary | ICD-10-CM | POA: Diagnosis not present

## 2016-01-18 DIAGNOSIS — M7061 Trochanteric bursitis, right hip: Secondary | ICD-10-CM | POA: Diagnosis not present

## 2016-01-18 DIAGNOSIS — R262 Difficulty in walking, not elsewhere classified: Secondary | ICD-10-CM | POA: Diagnosis not present

## 2016-01-19 DIAGNOSIS — M7061 Trochanteric bursitis, right hip: Secondary | ICD-10-CM | POA: Diagnosis not present

## 2016-01-19 DIAGNOSIS — R260 Ataxic gait: Secondary | ICD-10-CM | POA: Diagnosis not present

## 2016-01-19 DIAGNOSIS — M25561 Pain in right knee: Secondary | ICD-10-CM | POA: Diagnosis not present

## 2016-01-19 DIAGNOSIS — Z96651 Presence of right artificial knee joint: Secondary | ICD-10-CM | POA: Diagnosis not present

## 2016-01-19 DIAGNOSIS — Z471 Aftercare following joint replacement surgery: Secondary | ICD-10-CM | POA: Diagnosis not present

## 2016-01-19 DIAGNOSIS — M25551 Pain in right hip: Secondary | ICD-10-CM | POA: Diagnosis not present

## 2016-01-23 DIAGNOSIS — Z471 Aftercare following joint replacement surgery: Secondary | ICD-10-CM | POA: Diagnosis not present

## 2016-01-23 DIAGNOSIS — Z96651 Presence of right artificial knee joint: Secondary | ICD-10-CM | POA: Diagnosis not present

## 2016-01-23 DIAGNOSIS — M25561 Pain in right knee: Secondary | ICD-10-CM | POA: Diagnosis not present

## 2016-01-23 DIAGNOSIS — R260 Ataxic gait: Secondary | ICD-10-CM | POA: Diagnosis not present

## 2016-01-24 DIAGNOSIS — M1711 Unilateral primary osteoarthritis, right knee: Secondary | ICD-10-CM | POA: Diagnosis not present

## 2016-01-30 DIAGNOSIS — M25561 Pain in right knee: Secondary | ICD-10-CM | POA: Diagnosis not present

## 2016-01-30 DIAGNOSIS — R260 Ataxic gait: Secondary | ICD-10-CM | POA: Diagnosis not present

## 2016-01-30 DIAGNOSIS — Z471 Aftercare following joint replacement surgery: Secondary | ICD-10-CM | POA: Diagnosis not present

## 2016-01-30 DIAGNOSIS — Z96651 Presence of right artificial knee joint: Secondary | ICD-10-CM | POA: Diagnosis not present

## 2016-01-31 DIAGNOSIS — Z7901 Long term (current) use of anticoagulants: Secondary | ICD-10-CM | POA: Diagnosis not present

## 2016-02-01 DIAGNOSIS — Z96651 Presence of right artificial knee joint: Secondary | ICD-10-CM | POA: Diagnosis not present

## 2016-02-01 DIAGNOSIS — M25561 Pain in right knee: Secondary | ICD-10-CM | POA: Diagnosis not present

## 2016-02-01 DIAGNOSIS — Z471 Aftercare following joint replacement surgery: Secondary | ICD-10-CM | POA: Diagnosis not present

## 2016-02-01 DIAGNOSIS — R260 Ataxic gait: Secondary | ICD-10-CM | POA: Diagnosis not present

## 2016-02-05 DIAGNOSIS — M25561 Pain in right knee: Secondary | ICD-10-CM | POA: Diagnosis not present

## 2016-02-05 DIAGNOSIS — Z96651 Presence of right artificial knee joint: Secondary | ICD-10-CM | POA: Diagnosis not present

## 2016-02-05 DIAGNOSIS — Z471 Aftercare following joint replacement surgery: Secondary | ICD-10-CM | POA: Diagnosis not present

## 2016-02-05 DIAGNOSIS — R260 Ataxic gait: Secondary | ICD-10-CM | POA: Diagnosis not present

## 2016-02-06 DIAGNOSIS — Z7901 Long term (current) use of anticoagulants: Secondary | ICD-10-CM | POA: Diagnosis not present

## 2016-02-08 DIAGNOSIS — Z96651 Presence of right artificial knee joint: Secondary | ICD-10-CM | POA: Diagnosis not present

## 2016-02-08 DIAGNOSIS — R260 Ataxic gait: Secondary | ICD-10-CM | POA: Diagnosis not present

## 2016-02-08 DIAGNOSIS — Z471 Aftercare following joint replacement surgery: Secondary | ICD-10-CM | POA: Diagnosis not present

## 2016-02-08 DIAGNOSIS — M25561 Pain in right knee: Secondary | ICD-10-CM | POA: Diagnosis not present

## 2016-02-12 DIAGNOSIS — Z471 Aftercare following joint replacement surgery: Secondary | ICD-10-CM | POA: Diagnosis not present

## 2016-02-12 DIAGNOSIS — R260 Ataxic gait: Secondary | ICD-10-CM | POA: Diagnosis not present

## 2016-02-12 DIAGNOSIS — M25561 Pain in right knee: Secondary | ICD-10-CM | POA: Diagnosis not present

## 2016-02-12 DIAGNOSIS — Z96651 Presence of right artificial knee joint: Secondary | ICD-10-CM | POA: Diagnosis not present

## 2016-02-14 DIAGNOSIS — M25561 Pain in right knee: Secondary | ICD-10-CM | POA: Diagnosis not present

## 2016-02-14 DIAGNOSIS — Z96651 Presence of right artificial knee joint: Secondary | ICD-10-CM | POA: Diagnosis not present

## 2016-02-14 DIAGNOSIS — Z471 Aftercare following joint replacement surgery: Secondary | ICD-10-CM | POA: Diagnosis not present

## 2016-02-14 DIAGNOSIS — R260 Ataxic gait: Secondary | ICD-10-CM | POA: Diagnosis not present

## 2016-02-14 DIAGNOSIS — M1712 Unilateral primary osteoarthritis, left knee: Secondary | ICD-10-CM | POA: Diagnosis not present

## 2016-02-20 DIAGNOSIS — Z471 Aftercare following joint replacement surgery: Secondary | ICD-10-CM | POA: Diagnosis not present

## 2016-02-20 DIAGNOSIS — R260 Ataxic gait: Secondary | ICD-10-CM | POA: Diagnosis not present

## 2016-02-20 DIAGNOSIS — Z96651 Presence of right artificial knee joint: Secondary | ICD-10-CM | POA: Diagnosis not present

## 2016-02-20 DIAGNOSIS — M25561 Pain in right knee: Secondary | ICD-10-CM | POA: Diagnosis not present

## 2016-02-27 DIAGNOSIS — M25561 Pain in right knee: Secondary | ICD-10-CM | POA: Diagnosis not present

## 2016-02-27 DIAGNOSIS — Z96651 Presence of right artificial knee joint: Secondary | ICD-10-CM | POA: Diagnosis not present

## 2016-02-27 DIAGNOSIS — R260 Ataxic gait: Secondary | ICD-10-CM | POA: Diagnosis not present

## 2016-02-27 DIAGNOSIS — Z471 Aftercare following joint replacement surgery: Secondary | ICD-10-CM | POA: Diagnosis not present

## 2016-02-28 DIAGNOSIS — E876 Hypokalemia: Secondary | ICD-10-CM | POA: Diagnosis not present

## 2016-02-28 DIAGNOSIS — Z79899 Other long term (current) drug therapy: Secondary | ICD-10-CM | POA: Diagnosis not present

## 2016-02-28 DIAGNOSIS — E559 Vitamin D deficiency, unspecified: Secondary | ICD-10-CM | POA: Diagnosis not present

## 2016-02-28 DIAGNOSIS — Z7901 Long term (current) use of anticoagulants: Secondary | ICD-10-CM | POA: Diagnosis not present

## 2016-03-05 DIAGNOSIS — M25561 Pain in right knee: Secondary | ICD-10-CM | POA: Diagnosis not present

## 2016-03-05 DIAGNOSIS — Z471 Aftercare following joint replacement surgery: Secondary | ICD-10-CM | POA: Diagnosis not present

## 2016-03-05 DIAGNOSIS — Z96651 Presence of right artificial knee joint: Secondary | ICD-10-CM | POA: Diagnosis not present

## 2016-03-05 DIAGNOSIS — R260 Ataxic gait: Secondary | ICD-10-CM | POA: Diagnosis not present

## 2016-03-07 DIAGNOSIS — Z96651 Presence of right artificial knee joint: Secondary | ICD-10-CM | POA: Diagnosis not present

## 2016-03-07 DIAGNOSIS — M25561 Pain in right knee: Secondary | ICD-10-CM | POA: Diagnosis not present

## 2016-03-07 DIAGNOSIS — Z471 Aftercare following joint replacement surgery: Secondary | ICD-10-CM | POA: Diagnosis not present

## 2016-03-07 DIAGNOSIS — R260 Ataxic gait: Secondary | ICD-10-CM | POA: Diagnosis not present

## 2016-03-08 ENCOUNTER — Encounter: Payer: Self-pay | Admitting: Physical Medicine & Rehabilitation

## 2016-03-08 ENCOUNTER — Encounter: Payer: Medicare Other | Attending: Physical Medicine & Rehabilitation

## 2016-03-08 ENCOUNTER — Ambulatory Visit (HOSPITAL_BASED_OUTPATIENT_CLINIC_OR_DEPARTMENT_OTHER): Payer: Medicare Other | Admitting: Physical Medicine & Rehabilitation

## 2016-03-08 VITALS — BP 102/71 | HR 77

## 2016-03-08 DIAGNOSIS — M25551 Pain in right hip: Secondary | ICD-10-CM | POA: Insufficient documentation

## 2016-03-08 DIAGNOSIS — M47896 Other spondylosis, lumbar region: Secondary | ICD-10-CM | POA: Diagnosis not present

## 2016-03-08 DIAGNOSIS — Z5181 Encounter for therapeutic drug level monitoring: Secondary | ICD-10-CM | POA: Insufficient documentation

## 2016-03-08 DIAGNOSIS — Z79899 Other long term (current) drug therapy: Secondary | ICD-10-CM

## 2016-03-08 MED ORDER — ACETAMINOPHEN-CODEINE #4 300-60 MG PO TABS: 1.0000 | ORAL_TABLET | Freq: Four times a day (QID) | ORAL | 1 refills | Status: DC | PRN

## 2016-03-08 NOTE — Progress Notes (Signed)
Subjective:    Patient ID: Emily Peck, female    DOB: Aug 17, 1955, 61 y.o.   MRN: [id number]  HPI CC Right hip/back pain RIght L3,4, 5 RF helped back pain but noted increased pain in Right hip Back pain limits standing Some burning pain R lateral hip Pt had a shot in Right hip that has limited PT participation Interval hx Right TKR 01/09/16  Dr Rhona Raider, currently attends therapy twice a week at [facility] [place] [facility] post op, Was change from preoperative Tylenol with Codeine No. 4, 1tab 4 times a day to Oxycodone 5 mg Patient plans to go back to work in February.  Pain Inventory Average Pain 3 Pain Right Now 3 My pain is burning, dull and aching  In the last 24 hours, has pain interfered with the following? General activity 7 Relation with others 7 Enjoyment of life 7 What TIME of day is your pain at its worst? daytime Sleep (in general) Good  Pain is worse with: bending, sitting and standing Pain improves with: rest, heat/ice and medication Relief from Meds: 2  Mobility walk with assistance use a cane ability to climb steps?  yes do you drive?  no  Function employed # of hrs/week . I need assistance with the following:  meal prep, household duties and shopping  Neuro/Psych No problems in this area  Prior Studies Any changes since last visit?  no  Physicians involved in your care Any changes since last visit?  no   Family History  Problem Relation Age of Onset  . Cancer Mother   . Diabetes Mother    Social History   Social History  . Marital status: Single    Spouse name: N/A  . Number of children: N/A  . Years of education: N/A   Social History Main Topics  . Smoking status: Never Smoker  . Smokeless tobacco: Never Used  . Alcohol use No  . Drug use: No  . Sexual activity: Not on file   Other Topics Concern  . Not on file   Social History Narrative  . No narrative on file   Past Surgical History:    Procedure Laterality Date  . ABDOMINAL HYSTERECTOMY    . BACK SURGERY    . EYE SURGERY     eye removed right   . KNEE ARTHROSCOPY    . 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   Procedure: TOTAL KNEE ARTHROPLASTY;  Surgeon: Melrose Nakayama, MD;  Location: [facility];  Service: Orthopedics;  Laterality: Right;   Past Medical History:  Diagnosis Date  . Anxiety   . Clotting disorder ([place])   . Depression   . Dyspnea   . GERD (gastroesophageal reflux disease)   . Headache   . Lumbosacral spondylosis without myelopathy   . Neuropathy ([place])   . Postlaminectomy syndrome, lumbar region   . Postlaminectomy syndrome, thoracic region   . Stroke [facility])    There were no vitals taken for this visit.  Opioid Risk Score:   Fall Risk Score:  `1  Depression screen PHQ 2/9  Depression screen [facility] 2/9 06/05/2015 05/08/2015 11/10/2014  Decreased Interest 2 2 0  Down, Depressed, Hopeless 1 1 0  PHQ - 2 Score 3 3 0  Altered sleeping - 2 -  Tired, decreased energy - 2 -  Change in appetite - 2 -  Feeling bad or failure about yourself  - 2 -  Trouble concentrating - 2 -  Moving slowly  or fidgety/restless - 1 -  Suicidal thoughts - 0 -  PHQ-9 Score - 14 -  Difficult doing work/chores - Somewhat difficult -   Review of Systems  Constitutional: Negative.   HENT: Negative.   Eyes: Negative.   Respiratory: Negative.   Cardiovascular: Negative.   Gastrointestinal: Negative.   Endocrine: Negative.   Genitourinary: Negative.   Musculoskeletal: Positive for back pain, gait problem and joint swelling.  Skin: Negative.   Allergic/Immunologic: Negative.   Hematological: Negative.   Psychiatric/Behavioral: Negative.   All other systems reviewed and are negative.      Objective:   Physical Exam  Constitutional: She is oriented to person, place, and time. She appears well-developed and well-nourished.  HENT:  Head: Normocephalic and atraumatic.  Eyes: Conjunctivae and EOM are normal. Pupils are equal,  round, and reactive to light.  Neck: Normal range of motion.  Musculoskeletal:       Right hip: She exhibits tenderness and bony tenderness. She exhibits no deformity.       Lumbar back: She exhibits tenderness. She exhibits no deformity.  Tenderness over right trochanter of the hip  Neurological: She is alert and oriented to person, place, and time. She has normal strength. Gait abnormal.  No sensory deficit R thigh or leg  Psychiatric: She has a normal mood and affect. Her behavior is normal. Judgment and thought content normal.  Nursing note and vitals reviewed.         Assessment & Plan:  1. Lumbar spondylosis without myelopathy, she gets good relief referred low back pain with radiofrequency procedures. Currently 5 months post. We discussed repeat procedure to be done after patient is more fully recovered after her total knee replacement. Her gait disorder related to the total knee replacement, likely causing some exacerbation of back pain.  2. Right hip pain, exam consistent with trochanteric bursitis, patient states that 2 months ago. She received an injection which was helpful and allowed her to participate in physical therapy more. We'll repeat today  Right Trochanteric bursa injection  without ultrasound guidance  Indication Trochanteric bursitis. Exam has tenderness over the greater trochanter of the hip. Pain has not responded to conservative care such as exercise therapy and oral medications. Pain interferes with sleep or with mobility Informed consent was obtained after describing risks and benefits of the procedure with the patient these include bleeding bruising and infection. Patient has signed written consent form. Patient placed in a lateral decubitus position with the affected hip superior. Point of maximal pain was palpated marked and prepped with Betadine and entered with a needle to bone contact. Needle slightly withdrawn then 6mg  of betamethasone with 4 cc 1%  lidocaine were injected. Patient tolerated procedure well. Post procedure instructions given.

## 2016-03-08 NOTE — Patient Instructions (Signed)
Right hip injection today. Stop oxycodone and restart Tylenol with Codeine. Repeat radiofrequency in 3 months

## 2016-03-11 DIAGNOSIS — M25561 Pain in right knee: Secondary | ICD-10-CM | POA: Diagnosis not present

## 2016-03-11 DIAGNOSIS — Z471 Aftercare following joint replacement surgery: Secondary | ICD-10-CM | POA: Diagnosis not present

## 2016-03-11 DIAGNOSIS — R260 Ataxic gait: Secondary | ICD-10-CM | POA: Diagnosis not present

## 2016-03-11 DIAGNOSIS — Z96651 Presence of right artificial knee joint: Secondary | ICD-10-CM | POA: Diagnosis not present

## 2016-03-13 DIAGNOSIS — Z7901 Long term (current) use of anticoagulants: Secondary | ICD-10-CM | POA: Diagnosis not present

## 2016-03-13 DIAGNOSIS — Z471 Aftercare following joint replacement surgery: Secondary | ICD-10-CM | POA: Diagnosis not present

## 2016-03-13 DIAGNOSIS — R260 Ataxic gait: Secondary | ICD-10-CM | POA: Diagnosis not present

## 2016-03-13 DIAGNOSIS — M25561 Pain in right knee: Secondary | ICD-10-CM | POA: Diagnosis not present

## 2016-03-13 DIAGNOSIS — Z96651 Presence of right artificial knee joint: Secondary | ICD-10-CM | POA: Diagnosis not present

## 2016-03-13 LAB — TOXASSURE SELECT,+ANTIDEPR,UR

## 2016-03-14 NOTE — Progress Notes (Signed)
Urine drug screen for this encounter is consistent for prescribed medication 

## 2016-03-18 DIAGNOSIS — Z471 Aftercare following joint replacement surgery: Secondary | ICD-10-CM | POA: Diagnosis not present

## 2016-03-18 DIAGNOSIS — M25561 Pain in right knee: Secondary | ICD-10-CM | POA: Diagnosis not present

## 2016-03-18 DIAGNOSIS — R260 Ataxic gait: Secondary | ICD-10-CM | POA: Diagnosis not present

## 2016-03-18 DIAGNOSIS — Z96651 Presence of right artificial knee joint: Secondary | ICD-10-CM | POA: Diagnosis not present

## 2016-03-27 DIAGNOSIS — Z96651 Presence of right artificial knee joint: Secondary | ICD-10-CM | POA: Diagnosis not present

## 2016-03-27 DIAGNOSIS — M25561 Pain in right knee: Secondary | ICD-10-CM | POA: Diagnosis not present

## 2016-03-27 DIAGNOSIS — Z7901 Long term (current) use of anticoagulants: Secondary | ICD-10-CM | POA: Diagnosis not present

## 2016-03-27 DIAGNOSIS — R260 Ataxic gait: Secondary | ICD-10-CM | POA: Diagnosis not present

## 2016-03-27 DIAGNOSIS — M5416 Radiculopathy, lumbar region: Secondary | ICD-10-CM | POA: Diagnosis not present

## 2016-03-27 DIAGNOSIS — Z471 Aftercare following joint replacement surgery: Secondary | ICD-10-CM | POA: Diagnosis not present

## 2016-03-27 DIAGNOSIS — Z79899 Other long term (current) drug therapy: Secondary | ICD-10-CM | POA: Diagnosis not present

## 2016-03-27 DIAGNOSIS — E668 Other obesity: Secondary | ICD-10-CM | POA: Diagnosis not present

## 2016-04-01 DIAGNOSIS — M25561 Pain in right knee: Secondary | ICD-10-CM | POA: Diagnosis not present

## 2016-04-01 DIAGNOSIS — R260 Ataxic gait: Secondary | ICD-10-CM | POA: Diagnosis not present

## 2016-04-01 DIAGNOSIS — Z471 Aftercare following joint replacement surgery: Secondary | ICD-10-CM | POA: Diagnosis not present

## 2016-04-01 DIAGNOSIS — Z96651 Presence of right artificial knee joint: Secondary | ICD-10-CM | POA: Diagnosis not present

## 2016-04-05 DIAGNOSIS — Z7901 Long term (current) use of anticoagulants: Secondary | ICD-10-CM | POA: Diagnosis not present

## 2016-04-15 DIAGNOSIS — Z96651 Presence of right artificial knee joint: Secondary | ICD-10-CM | POA: Diagnosis not present

## 2016-04-15 DIAGNOSIS — M25561 Pain in right knee: Secondary | ICD-10-CM | POA: Diagnosis not present

## 2016-04-15 DIAGNOSIS — R260 Ataxic gait: Secondary | ICD-10-CM | POA: Diagnosis not present

## 2016-04-15 DIAGNOSIS — F315 Bipolar disorder, current episode depressed, severe, with psychotic features: Secondary | ICD-10-CM | POA: Diagnosis not present

## 2016-04-15 DIAGNOSIS — Z471 Aftercare following joint replacement surgery: Secondary | ICD-10-CM | POA: Diagnosis not present

## 2016-04-22 ENCOUNTER — Ambulatory Visit: Payer: Medicare Other | Admitting: Nurse Practitioner

## 2016-05-07 ENCOUNTER — Other Ambulatory Visit: Payer: Self-pay | Admitting: Physical Medicine & Rehabilitation

## 2016-05-07 ENCOUNTER — Telehealth: Payer: Self-pay | Admitting: Physical Medicine & Rehabilitation

## 2016-05-07 MED ORDER — ACETAMINOPHEN-CODEINE #4 300-60 MG PO TABS: 1.0000 | ORAL_TABLET | Freq: Four times a day (QID) | ORAL | 2 refills | Status: DC | PRN

## 2016-05-07 NOTE — Telephone Encounter (Signed)
Phoned number [phone] she was supposed to have a 3 month prescription of Tylenol 4 because she is not returning for 3 months - needs more drugs.

## 2016-05-08 ENCOUNTER — Ambulatory Visit: Payer: Medicare Other | Admitting: Nurse Practitioner

## 2016-05-20 DIAGNOSIS — M25561 Pain in right knee: Secondary | ICD-10-CM | POA: Diagnosis not present

## 2016-05-21 ENCOUNTER — Other Ambulatory Visit (HOSPITAL_COMMUNITY): Payer: Self-pay | Admitting: Orthopaedic Surgery

## 2016-05-21 DIAGNOSIS — M79661 Pain in right lower leg: Secondary | ICD-10-CM

## 2016-05-21 DIAGNOSIS — M7989 Other specified soft tissue disorders: Principal | ICD-10-CM

## 2016-05-22 ENCOUNTER — Encounter (HOSPITAL_COMMUNITY): Payer: Self-pay | Admitting: Cardiology

## 2016-05-22 ENCOUNTER — Ambulatory Visit (HOSPITAL_COMMUNITY)
Admission: RE | Admit: 2016-05-22 | Discharge: 2016-05-22 | Disposition: A | Discharge status: Home / Self Care | Payer: Medicare Other | Source: Ambulatory Visit | Attending: Orthopaedic Surgery | Admitting: Orthopaedic Surgery

## 2016-05-22 ENCOUNTER — Encounter (HOSPITAL_COMMUNITY): Payer: Self-pay | Admitting: Orthopaedic Surgery

## 2016-05-22 DIAGNOSIS — M7989 Other specified soft tissue disorders: Secondary | ICD-10-CM | POA: Insufficient documentation

## 2016-05-22 DIAGNOSIS — M79661 Pain in right lower leg: Secondary | ICD-10-CM | POA: Insufficient documentation

## 2016-05-22 NOTE — Progress Notes (Signed)
*  PRELIMINARY RESULTS* Vascular Ultrasound Right lower extremity venous duplex has been completed.  Preliminary findings: No evidence of DVT or baker's cyst.   Called results to [place].   Landry Mellow, RDMS, RVT  05/22/2016, 10:44 AM

## 2016-05-29 DIAGNOSIS — Z7901 Long term (current) use of anticoagulants: Secondary | ICD-10-CM | POA: Diagnosis not present

## 2016-05-29 DIAGNOSIS — Z79899 Other long term (current) drug therapy: Secondary | ICD-10-CM | POA: Diagnosis not present

## 2016-06-06 DIAGNOSIS — I8001 Phlebitis and thrombophlebitis of superficial vessels of right lower extremity: Secondary | ICD-10-CM | POA: Diagnosis not present

## 2016-06-06 DIAGNOSIS — Z7901 Long term (current) use of anticoagulants: Secondary | ICD-10-CM | POA: Diagnosis not present

## 2016-06-06 DIAGNOSIS — Z79899 Other long term (current) drug therapy: Secondary | ICD-10-CM | POA: Diagnosis not present

## 2016-06-07 ENCOUNTER — Ambulatory Visit: Payer: Medicare Other | Admitting: Nurse Practitioner

## 2016-06-07 ENCOUNTER — Ambulatory Visit: Payer: Medicare Other | Admitting: Physical Medicine & Rehabilitation

## 2016-06-10 DIAGNOSIS — M25561 Pain in right knee: Secondary | ICD-10-CM | POA: Diagnosis not present

## 2016-06-10 DIAGNOSIS — R260 Ataxic gait: Secondary | ICD-10-CM | POA: Diagnosis not present

## 2016-06-10 DIAGNOSIS — Z96651 Presence of right artificial knee joint: Secondary | ICD-10-CM | POA: Diagnosis not present

## 2016-06-10 DIAGNOSIS — Z471 Aftercare following joint replacement surgery: Secondary | ICD-10-CM | POA: Diagnosis not present

## 2016-06-19 DIAGNOSIS — M25561 Pain in right knee: Secondary | ICD-10-CM | POA: Diagnosis not present

## 2016-06-19 DIAGNOSIS — Z96651 Presence of right artificial knee joint: Secondary | ICD-10-CM | POA: Diagnosis not present

## 2016-06-19 DIAGNOSIS — R260 Ataxic gait: Secondary | ICD-10-CM | POA: Diagnosis not present

## 2016-06-19 DIAGNOSIS — Z471 Aftercare following joint replacement surgery: Secondary | ICD-10-CM | POA: Diagnosis not present

## 2016-06-24 ENCOUNTER — Ambulatory Visit: Payer: Medicare Other | Admitting: Nurse Practitioner

## 2016-06-27 DIAGNOSIS — Z471 Aftercare following joint replacement surgery: Secondary | ICD-10-CM | POA: Diagnosis not present

## 2016-06-27 DIAGNOSIS — R260 Ataxic gait: Secondary | ICD-10-CM | POA: Diagnosis not present

## 2016-06-27 DIAGNOSIS — M25561 Pain in right knee: Secondary | ICD-10-CM | POA: Diagnosis not present

## 2016-06-27 DIAGNOSIS — Z96651 Presence of right artificial knee joint: Secondary | ICD-10-CM | POA: Diagnosis not present

## 2016-07-02 DIAGNOSIS — R260 Ataxic gait: Secondary | ICD-10-CM | POA: Diagnosis not present

## 2016-07-02 DIAGNOSIS — Z96651 Presence of right artificial knee joint: Secondary | ICD-10-CM | POA: Diagnosis not present

## 2016-07-02 DIAGNOSIS — Z471 Aftercare following joint replacement surgery: Secondary | ICD-10-CM | POA: Diagnosis not present

## 2016-07-02 DIAGNOSIS — M25561 Pain in right knee: Secondary | ICD-10-CM | POA: Diagnosis not present

## 2016-07-04 DIAGNOSIS — R260 Ataxic gait: Secondary | ICD-10-CM | POA: Diagnosis not present

## 2016-07-04 DIAGNOSIS — Z471 Aftercare following joint replacement surgery: Secondary | ICD-10-CM | POA: Diagnosis not present

## 2016-07-04 DIAGNOSIS — Z96651 Presence of right artificial knee joint: Secondary | ICD-10-CM | POA: Diagnosis not present

## 2016-07-04 DIAGNOSIS — Z7901 Long term (current) use of anticoagulants: Secondary | ICD-10-CM | POA: Diagnosis not present

## 2016-07-04 DIAGNOSIS — M25561 Pain in right knee: Secondary | ICD-10-CM | POA: Diagnosis not present

## 2016-07-05 ENCOUNTER — Telehealth: Payer: Self-pay

## 2016-07-05 ENCOUNTER — Encounter: Payer: Self-pay | Admitting: Nurse Practitioner

## 2016-07-05 ENCOUNTER — Ambulatory Visit (HOSPITAL_BASED_OUTPATIENT_CLINIC_OR_DEPARTMENT_OTHER): Payer: Medicare Other | Admitting: Physical Medicine & Rehabilitation

## 2016-07-05 ENCOUNTER — Encounter: Payer: Self-pay | Admitting: Physical Medicine & Rehabilitation

## 2016-07-05 ENCOUNTER — Encounter: Payer: Medicare Other | Attending: Physical Medicine & Rehabilitation

## 2016-07-05 ENCOUNTER — Ambulatory Visit (INDEPENDENT_AMBULATORY_CARE_PROVIDER_SITE_OTHER): Payer: Medicare Other | Admitting: Nurse Practitioner

## 2016-07-05 VITALS — BP 110/68 | HR 64 | Ht 65.0 in | Wt 199.8 lb

## 2016-07-05 VITALS — BP 113/77 | HR 61 | Resp 14

## 2016-07-05 DIAGNOSIS — Z79899 Other long term (current) drug therapy: Secondary | ICD-10-CM

## 2016-07-05 DIAGNOSIS — M47817 Spondylosis without myelopathy or radiculopathy, lumbosacral region: Secondary | ICD-10-CM

## 2016-07-05 DIAGNOSIS — M47816 Spondylosis without myelopathy or radiculopathy, lumbar region: Secondary | ICD-10-CM | POA: Insufficient documentation

## 2016-07-05 DIAGNOSIS — G629 Polyneuropathy, unspecified: Secondary | ICD-10-CM

## 2016-07-05 DIAGNOSIS — Z5181 Encounter for therapeutic drug level monitoring: Secondary | ICD-10-CM | POA: Diagnosis not present

## 2016-07-05 DIAGNOSIS — M545 Low back pain: Secondary | ICD-10-CM | POA: Insufficient documentation

## 2016-07-05 DIAGNOSIS — G894 Chronic pain syndrome: Secondary | ICD-10-CM

## 2016-07-05 MED ORDER — ACETAMINOPHEN-CODEINE #4 300-60 MG PO TABS: 1.0000 | ORAL_TABLET | Freq: Four times a day (QID) | ORAL | 2 refills | Status: DC | PRN

## 2016-07-05 NOTE — Patient Instructions (Signed)

## 2016-07-05 NOTE — Progress Notes (Signed)
  PROCEDURE RECORD [facility]   Name: Emily Peck DOB:April 08, 1955 MRN: [id number]  Date:07/05/2016  Physician: Alysia Penna, MD    Nurse/CMA: Tasia Liz, CMA  Allergies:  Allergies  Allergen Reactions  . No Known Allergies     Consent Signed: Yes.    Is patient diabetic? No.  CBG today? n/a  Pregnant: No. LMP: No LMP recorded. Patient has had a hysterectomy. (age [age])  Anticoagulants: no Anti-inflammatory: no Antibiotics: no  Procedure: Radiofrequency neurotomy  Position: Prone Start Time: 1:10 pm  End Time: 1:27pm   Fluoro Time: 52  RN/CMA Jolan Mealor, CMA Demond Shallenberger, CMA    Time 12:45pm 1:37pm    BP 113/77 138/70    Pulse 61 66    Respirations 14 14    O2 Sat 96 94    S/S 6 6    Pain Level 5/10 3/10     D/C home with Cab, patient A & O X 3, D/C instructions reviewed, and sits independently.

## 2016-07-05 NOTE — Progress Notes (Signed)
[facility]  PATIENT: Emily Peck DOB: 17-Sep-1955   REASON FOR VISIT: Follow-up for neuropathy HISTORY FROM: Patient    HISTORY OF PRESENT ILLNESS:UPDATE 05/04/2018CM Emily Peck, 61 year old female returns for follow-up with history of polyneuropathy in her feet for years which has progressively worsened over time. She has a pins and needles burning in the feet, used to be only on the bottom of the feet balance on the top of the feet as well.She had right knee replacement November 2017. She is still in physical therapy. She continues to see Dr.Kirsteins bedtime for chronic low back pain and receives lumbar medial branch blocks. She has an appointment today. She says the injections last about 6 months. She continues to have lot of left leg pain due to her knee surgery. She denies any falls. She is ambulating with single-point cane. She returns for reevaluation  HISTORY 10/19/15 Emily Peck is a 61 y.o. female here as a referral from Dr. Baird Cancer for neuropathy in the feet. PMHx of hyperlipidemia, chronic lumbar radiculopathy, polyneuropathy, obesity, thoracic compression fractures T10-T11 s/pT12-L1 fusion, depression, glaucoma, retinal detachment, macular degeneration and poor vision.. She has had neuropathy in the feet for years. Progressively worsening, continuous. Pins and needles, burning in the feet, it used to be under the bottom now it on the top also. She denies diabetes or using any medications that can cause neuropathy she has never been exposed to toxins that can cause neuropathy. Mother was diabetic and had neuropathy. Associated ith cramps in the feet. The burning is the worst. Worse at night. The gabapentin helps. She takes 600mg  4x a day. Continous all day. Unknown inciting events. No medications that can induce neuropathy. No alcohol use. She takes 600mg  neurontin 4x a day.     REVIEW OF SYSTEMS: Full 14 system review of systems performed and notable  only for those listed, all others are neg:  Constitutional: neg  Cardiovascular: neg Ear/Nose/Throat: neg  Skin: neg Eyes: neg Respiratory: neg Gastroitestinal: neg  Hematology/Lymphatic: neg  Endocrine: neg Musculoskeletal:neg Allergy/Immunology: neg Neurological: neg Psychiatric: neg Sleep : neg   ALLERGIES: Allergies  Allergen Reactions  . No Known Allergies     HOME MEDICATIONS: Outpatient Medications Prior to Visit  Medication Sig Dispense Refill  . acetaminophen-codeine (TYLENOL #4) 300-60 MG tablet Take 1 tablet by mouth 4 (four) times daily as needed for moderate pain. 120 tablet 2  . escitalopram (LEXAPRO) 20 MG tablet Take 20 mg by mouth at bedtime.   0  . gabapentin (NEURONTIN) 600 MG tablet Take 2 tablets (1,200 mg total) by mouth 3 (three) times daily. (Patient taking differently: Take 600 mg by mouth 5 (five) times daily. ) 180 tablet 11  . methocarbamol (ROBAXIN) 500 MG tablet Take 1 tablet (500 mg total) by mouth every 6 (six) hours as needed for muscle spasms. 40 tablet 0  . topiramate (TOPAMAX) 200 MG tablet Take 200 mg by mouth at bedtime.   0  . traZODone (DESYREL) 150 MG tablet Take 150 mg by mouth at bedtime.  0  . Vitamin D, Ergocalciferol, (DRISDOL) 50000 units CAPS capsule Take 50,000 Units by mouth every 7 (seven) days.    Marland Kitchen warfarin (COUMADIN) 6 MG tablet Take 6-9 mg by mouth daily. Take 9mg  Monday, Wednesday, and Fridays, then all other days take 6mg s daily    . oxyCODONE (OXY IR/ROXICODONE) 5 MG immediate release tablet      No facility-administered medications prior to visit.     PAST  MEDICAL HISTORY: Past Medical History:  Diagnosis Date  . Anxiety   . Clotting disorder ([place])   . Depression   . Dyspnea   . GERD (gastroesophageal reflux disease)   . Headache   . Lumbosacral spondylosis without myelopathy   . Neuropathy   . Postlaminectomy syndrome, lumbar region   . Postlaminectomy syndrome, thoracic region   . Stroke [facility])     PAST  SURGICAL HISTORY: Past Surgical History:  Procedure Laterality Date  . ABDOMINAL HYSTERECTOMY    . BACK SURGERY    . EYE SURGERY     eye removed right   . KNEE ARTHROSCOPY    . 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   Procedure: TOTAL KNEE ARTHROPLASTY;  Surgeon: Melrose Nakayama, MD;  Location: [place];  Service: Orthopedics;  Laterality: Right;    FAMILY HISTORY: Family History  Problem Relation Age of Onset  . Cancer Mother   . Diabetes Mother     SOCIAL HISTORY: Social History   Social History  . Marital status: Single    Spouse name: N/A  . Number of children: N/A  . Years of education: N/A   Occupational History  . Not on file.   Social History Main Topics  . Smoking status: Never Smoker  . Smokeless tobacco: Never Used  . Alcohol use No  . Drug use: No  . Sexual activity: Not on file   Other Topics Concern  . Not on file   Social History Narrative  . No narrative on file     PHYSICAL EXAM  Vitals:   07/05/16 1010  BP: 110/68  Pulse: 64  Weight: 199 lb 12.8 oz (90.6 kg)  Height: 5\' 5"  (1.651 m)   Body mass index is 33.25 kg/m.  Generalized: Well developed, Obese female in no acute distress, well-groomed  Head: normocephalic and atraumatic,. Oropharynx benign  Neck: Supple, Musculoskeletal: No deformity   Neurological examination   Mentation: Alert oriented to time, place, history taking. Attention span and concentration appropriate. Recent and remote memory intact.  Follows all commands speech and language fluent.   Cranial nerve II-XII: Pupils were equal round reactive to light extraocular movements were full, visual field were full on confrontational test. Facial sensation and strength were normal. hearing was intact to finger rubbing bilaterally. Uvula tongue midline. head turning and shoulder shrug were normal and symmetric.Tongue protrusion into cheek strength was normal. Motor: normal bulk and tone, full strength in the BUE, BLE,  Sensory:  Decreased pinprick and temperature diminished,, intact vibration and proprioception  Coordination: finger-nose-finger, heel-to-shin bilaterally, no dysmetria Reflexes: Symmetric upper and lower except absent ankle jerks, plantar responses were flexor bilaterally. Gait and Station: Rising up from seated position with push off, wide based and mildly unsteady gait with single-point cane  Tandem gait is unsteady  DIAGNOSTIC DATA (LABS, IMAGING, TESTING) - I reviewed patient records, labs, notes, testing and imaging myself where available.  Lab Results  Component Value Date   WBC 11.9 (H) 01/09/2016   HGB 12.3 01/09/2016   HCT 37.3 01/09/2016   MCV 98.4 01/09/2016   PLT 281 01/09/2016      Component Value Date/Time   NA 138 12/29/2015 1453   K 3.3 (L) 12/29/2015 1453   CL 109 12/29/2015 1453   CO2 22 12/29/2015 1453   GLUCOSE 103 (H) 12/29/2015 1453   BUN 7 12/29/2015 1453   CREATININE 0.76 01/09/2016 1145   CALCIUM 9.5 12/29/2015 1453   PROT 7.2 10/19/2015 0811   ALBUMIN 4.1  03/09/2009 1241   AST 21 03/09/2009 1241   ALT 14 03/09/2009 1241   ALKPHOS 67 03/09/2009 1241   BILITOT 0.7 03/09/2009 1241   GFRNONAA >60 01/09/2016 1145   GFRAA >60 01/09/2016 1145    Lab Results  Component Value Date   HGBA1C 5.5 10/19/2015    ASSESSMENT AND PLAN  61 y.o. female here for neuropathy in the feet. PMHx of hyperlipidemia, chronic lumbar radiculopathy, polyneuropathy, obesity, thoracic compression fractures T10-T11 s/pT12-L1 fusion, depression, glaucoma, retinal detachment, macular degeneration and poor vision.. She has had neuropathy in the feet for years. Progressively worsening, continuous.Exam significant for distal sensory deficits and a small fiber distribution, intact vibration and proprioception which are more large fiber.   PLAN: Continue gabapentin at current dose does not need refills Continue physical therapy for right knee replacement then  home exercise program Follow-up  yearly with [place] Given information on neuropathy and reviewed this with her Dennie Bible, [facility], [facility], APRN  [facility] [address], [place], [place] [address] [phone]

## 2016-07-05 NOTE — Patient Instructions (Addendum)
Continue gabapentin at current dose does not need refills Continue physical therapy for right knee replacement. Continue home exercise program Follow-up yearly with [place]  Peripheral Neuropathy Peripheral neuropathy is a type of nerve damage. It affects nerves that carry signals between the spinal cord and other parts of the body. These are called peripheral nerves. With peripheral neuropathy, one nerve or a group of nerves may be damaged. What are the causes? Many things can damage peripheral nerves. For some people with peripheral neuropathy, the cause is unknown. Some causes include:  Diabetes. This is the most common cause of peripheral neuropathy.  Injury to a nerve.  Pressure or stress on a nerve that lasts a long time.  Too little vitamin B. Alcoholism can lead to this.  Infections.  Autoimmune diseases, such as multiple sclerosis and systemic lupus erythematosus.  Inherited nerve diseases.  Some medicines, such as cancer drugs.  Toxic substances, such as lead and mercury.  Too little blood flowing to the legs.  Kidney disease.  Thyroid disease. What are the signs or symptoms? Different people have different symptoms. The symptoms you have will depend on which of your nerves is damaged. Common symptoms include:  Loss of feeling (numbness) in the feet and hands.  Tingling in the feet and hands.  Pain that burns.  Very sensitive skin.  Weakness.  Not being able to move a part of the body (paralysis).  Muscle twitching.  Clumsiness or poor coordination.  Loss of balance.  Not being able to control your bladder.  Feeling dizzy.  Sexual problems. How is this diagnosed? Peripheral neuropathy is a symptom, not a disease. Finding the cause of peripheral neuropathy can be hard. To figure that out, your health care provider will take a medical history and do a physical exam. A neurological exam will also be done. This involves checking things affected by your  brain, spinal cord, and nerves (nervous system). For example, your health care provider will check your reflexes, how you move, and what you can feel. Other types of tests may also be ordered, such as:  Blood tests.  A test of the fluid in your spinal cord.  Imaging tests, such as CT scans or an MRI.  Electromyography (EMG). This test checks the nerves that control muscles.  Nerve conduction velocity tests. These tests check how fast messages pass through your nerves.  Nerve biopsy. A small piece of nerve is removed. It is then checked under a microscope. How is this treated?  Medicine is often used to treat peripheral neuropathy. Medicines may include:  Pain-relieving medicines. Prescription or over-the-counter medicine may be suggested.  Antiseizure medicine. This may be used for pain.  Antidepressants. These also may help ease pain from neuropathy.  Lidocaine. This is a numbing medicine. You might wear a patch or be given a shot.  Mexiletine. This medicine is typically used to help control irregular heart rhythms.  Surgery. Surgery may be needed to relieve pressure on a nerve or to destroy a nerve that is causing pain.  Physical therapy to help movement.  Assistive devices to help movement. Follow these instructions at home:  Only take over-the-counter or prescription medicines as directed by your health care provider. Follow the instructions carefully for any given medicines. Do not take any other medicines without first getting approval from your health care provider.  If you have diabetes, work closely with your health care provider to keep your blood sugar under control.  If you have numbness in your feet:  Check every day for signs of injury or infection. Watch for redness, warmth, and swelling.  Wear padded socks and comfortable shoes. These help protect your feet.  Do not do things that put pressure on your damaged nerve.  Do not smoke. Smoking keeps blood  from getting to damaged nerves.  Avoid or limit alcohol. Too much alcohol can cause a lack of B vitamins. These vitamins are needed for healthy nerves.  Develop a good support system. Coping with peripheral neuropathy can be stressful. Talk to a mental health specialist or join a support group if you are struggling.  Follow up with your health care provider as directed. Contact a health care provider if:  You have new signs or symptoms of peripheral neuropathy.  You are struggling emotionally from dealing with peripheral neuropathy.  You have a fever. Get help right away if:  You have an injury or infection that is not healing.  You feel very dizzy or begin vomiting.  You have chest pain.  You have trouble breathing. This information is not intended to replace advice given to you by your health care provider. Make sure you discuss any questions you have with your health care provider. Document Released: 02/08/2002 Document Revised: 07/27/2015 Document Reviewed: 10/26/2012 Elsevier Interactive Patient Education  2017 [employer].

## 2016-07-05 NOTE — Progress Notes (Signed)

## 2016-07-09 DIAGNOSIS — Z970 Presence of artificial eye: Secondary | ICD-10-CM | POA: Diagnosis not present

## 2016-07-09 DIAGNOSIS — H10411 Chronic giant papillary conjunctivitis, right eye: Secondary | ICD-10-CM | POA: Diagnosis not present

## 2016-07-10 DIAGNOSIS — Z79899 Other long term (current) drug therapy: Secondary | ICD-10-CM | POA: Diagnosis not present

## 2016-07-10 DIAGNOSIS — Z7901 Long term (current) use of anticoagulants: Secondary | ICD-10-CM | POA: Diagnosis not present

## 2016-07-10 DIAGNOSIS — I8001 Phlebitis and thrombophlebitis of superficial vessels of right lower extremity: Secondary | ICD-10-CM | POA: Diagnosis not present

## 2016-07-10 DIAGNOSIS — F315 Bipolar disorder, current episode depressed, severe, with psychotic features: Secondary | ICD-10-CM | POA: Diagnosis not present

## 2016-07-11 ENCOUNTER — Telehealth: Payer: Self-pay | Admitting: *Deleted

## 2016-07-11 DIAGNOSIS — Z7901 Long term (current) use of anticoagulants: Secondary | ICD-10-CM | POA: Diagnosis not present

## 2016-07-11 LAB — TOXASSURE SELECT,+ANTIDEPR,UR

## 2016-07-11 LAB — 6-ACETYLMORPHINE,TOXASSURE ADD
6-ACETYLMORPHINE: NEGATIVE
6-ACETYLMORPHINE: NOT DETECTED ng/mg{creat}

## 2016-07-11 NOTE — Telephone Encounter (Signed)
Urine drug screen for this encounter is consistent for prescribed medication.  Emily Peck is NOT taking oxycodone so it is appropriately absent.

## 2016-07-11 NOTE — Telephone Encounter (Signed)
We received notes and will scan them to your email for you to read and decipher.

## 2016-07-11 NOTE — Telephone Encounter (Signed)
Pt stated she has cellulitis and she is in a lot of pain. She stated she is taking the Tylenol that Dr. Dianna Limbo prescribed to her, but it is not helping. The Pt stated her PCP prescribed her Hydrocodone, but she has yet to fill the prescription. She wanted to know if she could fill the prescription and take it for the pain. She was notified over the telephone that she cannot fill the prescription because she is under a medication contract with our office. She was notified that a call would be put in and that someone would get back with her about the matter. Please advise.

## 2016-07-11 NOTE — Telephone Encounter (Signed)
Correspondence with Dr. Letta Pate was returned. He agreed to let patient stop taking T#3's and take hydrocodone for short term. Upon completion patient may return to T#3's. I contacted patient and clearly stated that permission was granted.  I was careful to clarify to the patient that she must stop Tylenol #3's, switch to the hydrocodone until completed, the switch back.  I stressed that she not take both medications at the sanme time.  Patient acknowledged

## 2016-07-11 NOTE — Telephone Encounter (Signed)
I am not clear about where the pt has pain , if it is post procedure pain then a muscle relaxer is appropriate.  Who diagnosed the cellulitis and where is it?  I do not see office not in EPIC If PCP has diagnosed an acute painful condition then it is reasonable to stop T#3 and use Hydrocodone for 5-7 d then resume T#3.  Please have PCP fax office records

## 2016-07-11 NOTE — Telephone Encounter (Signed)
Contacted PCP, last clinic notes will be faxed over shortly.

## 2016-07-11 NOTE — Progress Notes (Signed)
Personally  participated in, made any corrections needed, and agree with history, physical, neuro exam,assessment and plan as stated above.    Antonia Ahern, MD [facility] 

## 2016-07-22 DIAGNOSIS — M25561 Pain in right knee: Secondary | ICD-10-CM | POA: Diagnosis not present

## 2016-07-22 DIAGNOSIS — Z471 Aftercare following joint replacement surgery: Secondary | ICD-10-CM | POA: Diagnosis not present

## 2016-07-22 DIAGNOSIS — R260 Ataxic gait: Secondary | ICD-10-CM | POA: Diagnosis not present

## 2016-07-22 DIAGNOSIS — Z96651 Presence of right artificial knee joint: Secondary | ICD-10-CM | POA: Diagnosis not present

## 2016-08-05 DIAGNOSIS — H5440 Blindness, one eye, unspecified eye: Secondary | ICD-10-CM | POA: Diagnosis not present

## 2016-08-05 DIAGNOSIS — H35372 Puckering of macula, left eye: Secondary | ICD-10-CM | POA: Diagnosis not present

## 2016-08-05 DIAGNOSIS — H43812 Vitreous degeneration, left eye: Secondary | ICD-10-CM | POA: Diagnosis not present

## 2016-08-05 DIAGNOSIS — H26492 Other secondary cataract, left eye: Secondary | ICD-10-CM | POA: Diagnosis not present

## 2016-08-15 ENCOUNTER — Other Ambulatory Visit: Payer: Self-pay

## 2016-08-15 MED ORDER — ACETAMINOPHEN-CODEINE #4 300-60 MG PO TABS: 1.0000 | ORAL_TABLET | Freq: Four times a day (QID) | ORAL | 1 refills | Status: DC | PRN

## 2016-08-15 NOTE — Telephone Encounter (Addendum)
Patient called requesting refill for Tylenol 3, refilled called into [facility] [address], patient called and notified.

## 2016-09-10 ENCOUNTER — Other Ambulatory Visit: Payer: Self-pay | Admitting: *Deleted

## 2016-09-10 MED ORDER — ACETAMINOPHEN-CODEINE #4 300-60 MG PO TABS: 1.0000 | ORAL_TABLET | Freq: Four times a day (QID) | ORAL | 0 refills | Status: DC | PRN

## 2016-09-18 DIAGNOSIS — F315 Bipolar disorder, current episode depressed, severe, with psychotic features: Secondary | ICD-10-CM | POA: Diagnosis not present

## 2016-09-20 DIAGNOSIS — M12272 Villonodular synovitis (pigmented), left ankle and foot: Secondary | ICD-10-CM | POA: Diagnosis not present

## 2016-09-20 DIAGNOSIS — M25571 Pain in right ankle and joints of right foot: Secondary | ICD-10-CM | POA: Diagnosis not present

## 2016-09-20 DIAGNOSIS — M12271 Villonodular synovitis (pigmented), right ankle and foot: Secondary | ICD-10-CM | POA: Diagnosis not present

## 2016-09-20 DIAGNOSIS — M25572 Pain in left ankle and joints of left foot: Secondary | ICD-10-CM | POA: Diagnosis not present

## 2016-09-20 DIAGNOSIS — M65872 Other synovitis and tenosynovitis, left ankle and foot: Secondary | ICD-10-CM | POA: Diagnosis not present

## 2016-09-20 DIAGNOSIS — M65871 Other synovitis and tenosynovitis, right ankle and foot: Secondary | ICD-10-CM | POA: Diagnosis not present

## 2016-10-04 ENCOUNTER — Encounter: Payer: Self-pay | Admitting: Physical Medicine & Rehabilitation

## 2016-10-04 ENCOUNTER — Ambulatory Visit (HOSPITAL_BASED_OUTPATIENT_CLINIC_OR_DEPARTMENT_OTHER): Payer: Medicare Other | Admitting: Physical Medicine & Rehabilitation

## 2016-10-04 ENCOUNTER — Encounter: Payer: Medicare Other | Attending: Physical Medicine & Rehabilitation

## 2016-10-04 VITALS — BP 103/69 | HR 73

## 2016-10-04 DIAGNOSIS — M47817 Spondylosis without myelopathy or radiculopathy, lumbosacral region: Secondary | ICD-10-CM

## 2016-10-04 DIAGNOSIS — M47816 Spondylosis without myelopathy or radiculopathy, lumbar region: Secondary | ICD-10-CM | POA: Diagnosis not present

## 2016-10-04 DIAGNOSIS — M25562 Pain in left knee: Secondary | ICD-10-CM | POA: Diagnosis not present

## 2016-10-04 DIAGNOSIS — Z7901 Long term (current) use of anticoagulants: Secondary | ICD-10-CM | POA: Diagnosis not present

## 2016-10-04 DIAGNOSIS — M25571 Pain in right ankle and joints of right foot: Secondary | ICD-10-CM | POA: Diagnosis not present

## 2016-10-04 DIAGNOSIS — M25561 Pain in right knee: Secondary | ICD-10-CM | POA: Diagnosis not present

## 2016-10-04 DIAGNOSIS — M25572 Pain in left ankle and joints of left foot: Secondary | ICD-10-CM | POA: Diagnosis not present

## 2016-10-04 DIAGNOSIS — M545 Low back pain: Secondary | ICD-10-CM | POA: Insufficient documentation

## 2016-10-04 DIAGNOSIS — M65872 Other synovitis and tenosynovitis, left ankle and foot: Secondary | ICD-10-CM | POA: Diagnosis not present

## 2016-10-04 DIAGNOSIS — M65871 Other synovitis and tenosynovitis, right ankle and foot: Secondary | ICD-10-CM | POA: Diagnosis not present

## 2016-10-04 DIAGNOSIS — G8929 Other chronic pain: Secondary | ICD-10-CM

## 2016-10-04 DIAGNOSIS — Z79899 Other long term (current) drug therapy: Secondary | ICD-10-CM | POA: Diagnosis not present

## 2016-10-04 DIAGNOSIS — E876 Hypokalemia: Secondary | ICD-10-CM | POA: Diagnosis not present

## 2016-10-04 MED ORDER — ACETAMINOPHEN-CODEINE #4 300-60 MG PO TABS: 1.0000 | ORAL_TABLET | Freq: Four times a day (QID) | ORAL | 2 refills | Status: DC | PRN

## 2016-10-04 NOTE — Progress Notes (Signed)
Subjective:    Patient ID: Emily Peck, female    DOB: 11-Dec-1955, 61 y.o.   MRN: [id number]  HPI Right L3-4-5 Medial branch RFA 07/05/2016, improved ability to stand and reduced back pain Was doing well until pt developed symptoms of Right lower ext shooting pains which are relieved by a topical OTC  Still takes Robaxin prn but not at work  Mainly sits at work, 40hrs per wk  Still some pain in Right Knee , hx TKR 01/2016 Using diclofenac gel and Biofreeze as well Pain Inventory Average Pain 6 Pain Right Now 8 My pain is .  In the last 24 hours, has pain interfered with the following? General activity 5 Relation with others 5 Enjoyment of life 5 What TIME of day is your pain at its worst? daytime Sleep (in general) Fair  Pain is worse with: walking, bending, standing and some activites Pain improves with: rest and medication Relief from Meds: 3  Mobility use a cane ability to climb steps?  no do you drive?  no  Function employed # of hrs/week 40  Neuro/Psych bladder control problems numbness tingling trouble walking spasms dizziness  Prior Studies Any changes since last visit?  no  Physicians involved in your care Any changes since last visit?  no   Family History  Problem Relation Age of Onset  . Cancer Mother   . Diabetes Mother    Social History   Social History  . Marital status: Single    Spouse name: N/A  . Number of children: N/A  . Years of education: N/A   Social History Main Topics  . Smoking status: Never Smoker  . Smokeless tobacco: Never Used  . Alcohol use No  . Drug use: No  . Sexual activity: Not Asked   Other Topics Concern  . None   Social History Narrative  . None   Past Surgical History:  Procedure Laterality Date  . ABDOMINAL HYSTERECTOMY    . BACK SURGERY    . EYE SURGERY     eye removed right   . KNEE ARTHROSCOPY    . 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   Procedure: TOTAL KNEE ARTHROPLASTY;   Surgeon: Melrose Nakayama, MD;  Location: [place];  Service: Orthopedics;  Laterality: Right;   Past Medical History:  Diagnosis Date  . Anxiety   . Clotting disorder ([place])   . Depression   . Dyspnea   . GERD (gastroesophageal reflux disease)   . Headache   . Lumbosacral spondylosis without myelopathy   . Neuropathy   . Postlaminectomy syndrome, lumbar region   . Postlaminectomy syndrome, thoracic region   . Stroke ([place])    BP 103/69   Pulse 73   SpO2 97%   Opioid Risk Score:   Fall Risk Score:  `1  Depression screen PHQ 2/9  Depression screen [facility] 2/9 06/05/2015 05/08/2015 11/10/2014  Decreased Interest 2 2 0  Down, Depressed, Hopeless 1 1 0  PHQ - 2 Score 3 3 0  Altered sleeping - 2 -  Tired, decreased energy - 2 -  Change in appetite - 2 -  Feeling bad or failure about yourself  - 2 -  Trouble concentrating - 2 -  Moving slowly or fidgety/restless - 1 -  Suicidal thoughts - 0 -  PHQ-9 Score - 14 -  Difficult doing work/chores - Somewhat difficult -  Some recent data might be hidden     Review of Systems  Constitutional: Negative.  HENT: Negative.   Eyes: Negative.   Respiratory: Negative.   Cardiovascular: Negative.   Gastrointestinal: Negative.   Endocrine: Negative.   Genitourinary: Negative.   Musculoskeletal: Positive for gait problem.  Skin: Negative.   Allergic/Immunologic: Negative.   Hematological: Negative.   Psychiatric/Behavioral: Negative.   All other systems reviewed and are negative.      Objective:   Physical Exam  Constitutional: She is oriented to person, place, and time. She appears well-developed and well-nourished.  HENT:  Head: Normocephalic and atraumatic.  Eyes: Pupils are equal, round, and reactive to light. Conjunctivae and EOM are normal.  Pulmonary/Chest: Effort normal and breath sounds normal.  Neurological: She is alert and oriented to person, place, and time.  Psychiatric: She has a normal mood and affect.  Nursing note and  vitals reviewed.  Ambulates with a cane. No evidence of toe drag or knee instability. Her back has reduced range of motion with action at 50%, extension to 25%. Lateral bending to 25%. Extension and right-sided lateral bending hurts the most. She has mild tenderness to the lumbar paraspinal palpation. There is no evidence of spinal deformity. There is no evidence of straight leg raising abnormalities       Assessment & Plan:  1. Lumbar spondylosis without myelopathy, standing balance, improved after radiofrequency neurotomy, right L3, L4 medial branch and L5 dorsal ramus. We discussed usual duration of response, i.e., 6-12 months. She is about 3 months post, will reassess at the three-month mark and if her right-sided low back pain appears to be increasing once again may be scheduled for repeat lumbar radiofrequency neurotomy in November or December  Continue Tylenol with Codeine No. 3 one tablet 4 times per day Patient also takes Robaxin 500 mg every 6 hours as needed. She does not take this every day.  If the patient develops increasing right lower extremity pain accompanied by numbness or tingling, and may need further evaluation, possible physical therapy, possible MRI  2. Right knee pain status post TKR, proximally 9 months post. Instructed patient to follow-up with orthopedic surgery

## 2016-10-15 ENCOUNTER — Other Ambulatory Visit: Payer: Self-pay | Admitting: Physical Medicine & Rehabilitation

## 2016-10-24 DIAGNOSIS — Z7901 Long term (current) use of anticoagulants: Secondary | ICD-10-CM | POA: Diagnosis not present

## 2016-11-11 DIAGNOSIS — M25562 Pain in left knee: Secondary | ICD-10-CM | POA: Diagnosis not present

## 2016-11-11 DIAGNOSIS — M25561 Pain in right knee: Secondary | ICD-10-CM | POA: Diagnosis not present

## 2016-11-12 DIAGNOSIS — M25561 Pain in right knee: Secondary | ICD-10-CM | POA: Diagnosis not present

## 2016-11-12 DIAGNOSIS — M25562 Pain in left knee: Secondary | ICD-10-CM | POA: Diagnosis not present

## 2016-11-13 DIAGNOSIS — R51 Headache: Secondary | ICD-10-CM | POA: Diagnosis not present

## 2016-11-13 DIAGNOSIS — Z7901 Long term (current) use of anticoagulants: Secondary | ICD-10-CM | POA: Diagnosis not present

## 2016-11-14 ENCOUNTER — Other Ambulatory Visit: Payer: Self-pay | Admitting: Family

## 2016-11-14 DIAGNOSIS — Z7901 Long term (current) use of anticoagulants: Secondary | ICD-10-CM | POA: Diagnosis not present

## 2016-11-14 DIAGNOSIS — R519 Headache, unspecified: Secondary | ICD-10-CM

## 2016-11-14 DIAGNOSIS — R51 Headache: Principal | ICD-10-CM

## 2016-11-28 ENCOUNTER — Other Ambulatory Visit: Payer: Medicare Other

## 2016-11-28 DIAGNOSIS — Z7901 Long term (current) use of anticoagulants: Secondary | ICD-10-CM | POA: Diagnosis not present

## 2016-11-28 DIAGNOSIS — Z954 Presence of other heart-valve replacement: Secondary | ICD-10-CM | POA: Diagnosis not present

## 2016-11-29 ENCOUNTER — Ambulatory Visit
Admission: RE | Admit: 2016-11-29 | Discharge: 2016-11-29 | Disposition: A | Discharge status: Home / Self Care | Payer: Medicare Other | Source: Ambulatory Visit | Attending: Family | Admitting: Family

## 2016-11-29 DIAGNOSIS — R519 Headache, unspecified: Secondary | ICD-10-CM

## 2016-11-29 DIAGNOSIS — R42 Dizziness and giddiness: Secondary | ICD-10-CM | POA: Diagnosis not present

## 2016-11-29 DIAGNOSIS — R51 Headache: Principal | ICD-10-CM

## 2016-12-15 ENCOUNTER — Other Ambulatory Visit: Payer: Self-pay | Admitting: Neurology

## 2016-12-15 DIAGNOSIS — Z23 Encounter for immunization: Secondary | ICD-10-CM | POA: Diagnosis not present

## 2016-12-19 DIAGNOSIS — Z7901 Long term (current) use of anticoagulants: Secondary | ICD-10-CM | POA: Diagnosis not present

## 2016-12-19 DIAGNOSIS — Z954 Presence of other heart-valve replacement: Secondary | ICD-10-CM | POA: Diagnosis not present

## 2017-01-03 ENCOUNTER — Encounter: Payer: Medicare Other | Attending: Physical Medicine & Rehabilitation | Admitting: Registered Nurse

## 2017-01-03 ENCOUNTER — Ambulatory Visit: Payer: Medicare Other | Admitting: Physical Medicine & Rehabilitation

## 2017-01-03 ENCOUNTER — Encounter: Payer: Self-pay | Admitting: Registered Nurse

## 2017-01-03 ENCOUNTER — Encounter: Payer: Medicare Other | Admitting: Registered Nurse

## 2017-01-03 ENCOUNTER — Telehealth: Payer: Self-pay | Admitting: Registered Nurse

## 2017-01-03 VITALS — BP 111/73 | HR 68

## 2017-01-03 DIAGNOSIS — M961 Postlaminectomy syndrome, not elsewhere classified: Secondary | ICD-10-CM

## 2017-01-03 DIAGNOSIS — M47817 Spondylosis without myelopathy or radiculopathy, lumbosacral region: Secondary | ICD-10-CM

## 2017-01-03 DIAGNOSIS — Z5181 Encounter for therapeutic drug level monitoring: Secondary | ICD-10-CM | POA: Diagnosis not present

## 2017-01-03 DIAGNOSIS — G894 Chronic pain syndrome: Secondary | ICD-10-CM | POA: Diagnosis not present

## 2017-01-03 DIAGNOSIS — M47816 Spondylosis without myelopathy or radiculopathy, lumbar region: Secondary | ICD-10-CM | POA: Insufficient documentation

## 2017-01-03 DIAGNOSIS — M5416 Radiculopathy, lumbar region: Secondary | ICD-10-CM

## 2017-01-03 DIAGNOSIS — Z79899 Other long term (current) drug therapy: Secondary | ICD-10-CM

## 2017-01-03 DIAGNOSIS — M545 Low back pain: Secondary | ICD-10-CM | POA: Insufficient documentation

## 2017-01-03 DIAGNOSIS — M7061 Trochanteric bursitis, right hip: Secondary | ICD-10-CM | POA: Diagnosis not present

## 2017-01-03 MED ORDER — ACETAMINOPHEN-CODEINE #4 300-60 MG PO TABS: 1.0000 | ORAL_TABLET | Freq: Four times a day (QID) | ORAL | 2 refills | Status: DC | PRN

## 2017-01-03 NOTE — Progress Notes (Signed)
Subjective:    Patient ID: Emily Peck, female    DOB: February 10, 1956, 61 y.o.   MRN: [id number]  HPI: [name] is a 61 year old female who returns for follow up appointment  for chronic pain and medication refill.She states her pain is located in her lower back pain radiating into her right lower extremity and right hip pain. She did not rate her pain.  Her current exercise regime is performing stretching exercises and walking short distances.  Emily Peck Morphine equivalent is 36.00 MME.  Ms. Even works full time at the [employer].     Pain Inventory Average Pain NA Pain Right Now . My pain is NA  In the last 24 hours, has pain interfered with the following? General activity 7 Relation with others 7 Enjoyment of life 7 What TIME of day is your pain at its worst? Daytime, Evening and Night Sleep (in general) Fair  Pain is worse with: walking, standing and some activites Pain improves with: NA Relief from Meds: NA  Mobility walk with assistance use a cane ability to climb steps?  yes do you drive?  no  Function employed # of hrs/week . Do you have any goals in this area?  no  Neuro/Psych spasms  Prior Studies Any changes since last visit?  no  Physicians involved in your care Any changes since last visit?  no   Family History  Problem Relation Age of Onset  . Cancer Mother   . Diabetes Mother    Social History   Social History  . Marital status: Single    Spouse name: N/A  . Number of children: N/A  . Years of education: N/A   Social History Main Topics  . Smoking status: Never Smoker  . Smokeless tobacco: Never Used  . Alcohol use No  . Drug use: No  . Sexual activity: Not Asked   Other Topics Concern  . None   Social History Narrative  . None   Past Surgical History:  Procedure Laterality Date  . ABDOMINAL HYSTERECTOMY    . BACK SURGERY    . EYE SURGERY     eye removed right   . KNEE ARTHROSCOPY    . TOTAL  KNEE ARTHROPLASTY Right 01/09/2016   Procedure: TOTAL KNEE ARTHROPLASTY;  Surgeon: Melrose Nakayama, MD;  Location: [facility];  Service: Orthopedics;  Laterality: Right;   Past Medical History:  Diagnosis Date  . Anxiety   . Clotting disorder ([place])   . Depression   . Dyspnea   . GERD (gastroesophageal reflux disease)   . Headache   . Lumbosacral spondylosis without myelopathy   . Neuropathy   . Postlaminectomy syndrome, lumbar region   . Postlaminectomy syndrome, thoracic region   . Stroke ([place])    BP 111/73   Pulse 68   SpO2 94%   Opioid Risk Score:  8 Fall Risk Score:  `1  Depression screen PHQ 2/9  Depression screen [facility] 2/9 01/03/2017 06/05/2015 05/08/2015 11/10/2014  Decreased Interest 0 2 2 0  Down, Depressed, Hopeless 0 1 1 0  PHQ - 2 Score 0 3 3 0  Altered sleeping - - 2 -  Tired, decreased energy - - 2 -  Change in appetite - - 2 -  Feeling bad or failure about yourself  - - 2 -  Trouble concentrating - - 2 -  Moving slowly or fidgety/restless - - 1 -  Suicidal thoughts - - 0 -  PHQ-9 Score - - 14 -  Difficult doing work/chores - - Somewhat difficult -  Some recent data might be hidden     Review of Systems  All other systems reviewed and are negative.      Objective:   Physical Exam  Constitutional: She is oriented to person, place, and time. She appears well-developed and well-nourished.  HENT:  Head: Normocephalic and atraumatic.  Neck: Normal range of motion. Neck supple.  Cardiovascular: Normal rate and regular rhythm.   Pulmonary/Chest: Effort normal and breath sounds normal.  Musculoskeletal:  Normal Muscle Bulk and Muscle Testing Reveals: Upper Extremities: Full ROM and Muscle Strength 5/5 Lumbar Paraspinal Tenderness: L-3- L-5 Mainly Right Side Right Greater Trochanter Tenderness Lower Extremities: Full ROM and Muscle Strength 5/5 Arises from chair slowly using straight cane for support Narrow Based Gait   Neurological: She is alert and oriented to  person, place, and time.  Skin: Skin is warm and dry.  Psychiatric: She has a normal mood and affect.  Nursing note and vitals reviewed.         Assessment & Plan:  1. 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 Refilled:Tylenol #4 one tablet 4 times a day as needed #120 01/03/2017 2. Greater Trochanteric Bursitis: Continue with Heat/ Ice Therapy. Will schedule Hip Injection with Dr. Letta Pate in January. 01/03/2017 3. Right Lumbar Radiculitis: Schedule Right MBB with Dr. Letta Pate. 01/03/2017  20 minutes of face to face patient care time was spent during this visit. All questions were encouraged and answered.  F/U in 1 months

## 2017-01-03 NOTE — Telephone Encounter (Signed)
On 01/03/2017 the  [place] was reviewed no conflict was seen on the [place] with multiple prescribers. Emily Peck has a signed narcotic contract with our office. If there were any discrepancies this would have been reported to her physician.

## 2017-01-06 DIAGNOSIS — F315 Bipolar disorder, current episode depressed, severe, with psychotic features: Secondary | ICD-10-CM | POA: Diagnosis not present

## 2017-01-08 LAB — DRUG TOX MONITOR 1 W/CONF, ORAL FLD
AMPHETAMINES: NEGATIVE ng/mL (ref <10)
BARBITURATES: NEGATIVE ng/mL (ref <10)
BUPRENORPHINE: NEGATIVE ng/mL (ref <0.10)
Benzodiazepines: NEGATIVE ng/mL (ref <0.50)
COCAINE: NEGATIVE ng/mL (ref <5.0)
Cotinine: 53.7 ng/mL — ABNORMAL HIGH (ref <5.0)
Dihydrocodeine: NEGATIVE ng/mL (ref <2.5)
Fentanyl: NEGATIVE ng/mL (ref <0.10)
HYDROCODONE: NEGATIVE ng/mL (ref <2.5)
Heroin Metabolite: NEGATIVE ng/mL (ref <1.0)
Hydromorphone: NEGATIVE ng/mL (ref <2.5)
MARIJUANA: NEGATIVE ng/mL (ref <2.5)
MDMA: NEGATIVE ng/mL (ref <10)
MORPHINE: NEGATIVE ng/mL (ref <2.5)
Meprobamate: NEGATIVE ng/mL (ref <2.5)
Methadone: NEGATIVE ng/mL (ref <5.0)
Nicotine Metabolite: POSITIVE ng/mL — AB (ref <5.0)
Norhydrocodone: NEGATIVE ng/mL (ref <2.5)
Noroxycodone: NEGATIVE ng/mL (ref <2.5)
OXYCODONE: NEGATIVE ng/mL (ref <2.5)
Opiates: POSITIVE ng/mL — AB (ref <2.5)
Oxymorphone: NEGATIVE ng/mL (ref <2.5)
Phencyclidine: NEGATIVE ng/mL (ref <10)
TAPENTADOL: NEGATIVE ng/mL (ref <5.0)
TRAMADOL: NEGATIVE ng/mL (ref <5.0)
ZOLPIDEM: NEGATIVE ng/mL (ref <5.0)

## 2017-01-08 LAB — DRUG TOX ALC METAB W/CON, ORAL FLD: ALCOHOL METABOLITE: NEGATIVE ng/mL (ref <25)

## 2017-01-13 ENCOUNTER — Telehealth: Payer: Self-pay | Admitting: *Deleted

## 2017-01-13 NOTE — Telephone Encounter (Signed)
Oral swab drug screen was consistent for prescribed medications.  ?

## 2017-01-16 DIAGNOSIS — Z954 Presence of other heart-valve replacement: Secondary | ICD-10-CM | POA: Diagnosis not present

## 2017-01-16 DIAGNOSIS — Z7901 Long term (current) use of anticoagulants: Secondary | ICD-10-CM | POA: Diagnosis not present

## 2017-02-07 ENCOUNTER — Encounter: Payer: Self-pay | Admitting: Physical Medicine & Rehabilitation

## 2017-02-07 ENCOUNTER — Encounter: Payer: Medicare Other | Attending: Physical Medicine & Rehabilitation

## 2017-02-07 ENCOUNTER — Ambulatory Visit (HOSPITAL_BASED_OUTPATIENT_CLINIC_OR_DEPARTMENT_OTHER): Payer: Medicare Other | Admitting: Physical Medicine & Rehabilitation

## 2017-02-07 VITALS — BP 125/80 | HR 67

## 2017-02-07 DIAGNOSIS — M545 Low back pain: Secondary | ICD-10-CM | POA: Insufficient documentation

## 2017-02-07 DIAGNOSIS — M47817 Spondylosis without myelopathy or radiculopathy, lumbosacral region: Secondary | ICD-10-CM

## 2017-02-07 DIAGNOSIS — M47816 Spondylosis without myelopathy or radiculopathy, lumbar region: Secondary | ICD-10-CM | POA: Diagnosis not present

## 2017-02-07 NOTE — Patient Instructions (Signed)
Resume warfarin tonite  You had a radio frequency procedure today This was done to alleviate joint pain in your lumbar area We injected lidocaine which is a local anesthetic.  You may experience soreness at the injection sites. You may also experienced some irritation of the nerves that were heated I'm recommending ice for 30 minutes every 2 hours as needed for the next 24-48 hours In addition he will be taking gabapentin 600 mg 3 times a day today only if this is not one of your usual medicines or if you are not on Lyrica

## 2017-02-07 NOTE — Progress Notes (Signed)
RightL5 dorsal ramus., Right L4 and Right L3 medial branch radio frequency neurotomy under fluoroscopic guidance   Indication: Low back pain due to lumbar spondylosis which has been relieved on 2 occasions by greater than 50% by lumbar medial branch blocks and prior RFA for 59months  Informed consent was obtained after describing risks and benefits of the procedure with the patient, this includes bleeding, bruising, infection, paralysis and medication side effects. The pt has d/ced Warfarin 7 day ago.The patient wishes to proceed and has given written consent. The patient was placed in a prone position. The lumbar and sacral area was marked and prepped with Betadine. A 25-gauge 1-1/2 inch needle was inserted into the skin and subcutaneous tissue at 3 sites in one ML of 1% lidocaine was injected into each site. Then a 18-gauge 10 cm radio frequency needle with a 1 cm curved active tip was inserted targeting the Right S1 SAP/sacral ala junction. Bone contact was made and confirmed with lateral imaging.  motor stimulation at 2 Hz confirm proper needle location followed by injection of 36ml 2% MPF lidocaine. Then the Right L5 SAP/transverse process junction was targeted. Bone contact was made and confirmed with lateral imaging.  motor stimulation at 2 Hz confirm proper needle location followed by injection of 42ml 2% MPF lidocaine. Then the Right L4 SAP/transverse process junction was targeted. Bone contact was made and confirmed with lateral imaging. motor stimulation at 2 Hz confirm proper needle location followed by injection of 80ml 2% MPF lidocaine. Radio frequency lesion being at [facility] for 90 seconds was performed. Needles were removed. Post procedure instructions and vital signs were performed. Patient tolerated procedure well. Followup appointment was given. May restart Warfarin tonite

## 2017-02-07 NOTE — Progress Notes (Signed)
  PROCEDURE RECORD [facility]   Name: Emily Peck DOB:1956-01-01 MRN: [id number]  Date:02/07/2017  Physician: Alysia Penna, MD    Nurse/CMA: Bright CMA  Allergies:  Allergies  Allergen Reactions  . No Known Allergies     Consent Signed: Yes.    Is patient diabetic? No.  CBG today? NA  Pregnant: No. LMP: No LMP recorded. Patient has had a hysterectomy. (age [age])  Anticoagulants: yes (warfaren, has not taken for 1 week) Anti-inflammatory: no Antibiotics: no  Procedure: right L3-5 RFA Position: Prone   Start Time: 143pm  End Time: 205pm  Fluoro Time: 75s  RN/CMA Bright CMA Bright CMA    Time 124pm 211pm    BP 125/80 119/70    Pulse 67 68    Respirations 16 16    O2 Sat 98 97    S/S 6 6    Pain Level 8/10 3/10     D/C home with cab, patient A & O X 3, D/C instructions reviewed, and sits independently.

## 2017-02-20 DIAGNOSIS — Z7901 Long term (current) use of anticoagulants: Secondary | ICD-10-CM | POA: Diagnosis not present

## 2017-02-20 DIAGNOSIS — Z952 Presence of prosthetic heart valve: Secondary | ICD-10-CM | POA: Diagnosis not present

## 2017-03-10 DIAGNOSIS — H4422 Degenerative myopia, left eye: Secondary | ICD-10-CM | POA: Diagnosis not present

## 2017-03-10 DIAGNOSIS — M722 Plantar fascial fibromatosis: Secondary | ICD-10-CM | POA: Diagnosis not present

## 2017-03-10 DIAGNOSIS — E119 Type 2 diabetes mellitus without complications: Secondary | ICD-10-CM | POA: Diagnosis not present

## 2017-03-10 DIAGNOSIS — H26499 Other secondary cataract, unspecified eye: Secondary | ICD-10-CM | POA: Diagnosis not present

## 2017-03-10 DIAGNOSIS — H04122 Dry eye syndrome of left lacrimal gland: Secondary | ICD-10-CM | POA: Diagnosis not present

## 2017-03-10 DIAGNOSIS — M71572 Other bursitis, not elsewhere classified, left ankle and foot: Secondary | ICD-10-CM | POA: Diagnosis not present

## 2017-03-10 DIAGNOSIS — H35372 Puckering of macula, left eye: Secondary | ICD-10-CM | POA: Diagnosis not present

## 2017-03-10 DIAGNOSIS — H43812 Vitreous degeneration, left eye: Secondary | ICD-10-CM | POA: Diagnosis not present

## 2017-03-10 DIAGNOSIS — Z79899 Other long term (current) drug therapy: Secondary | ICD-10-CM | POA: Diagnosis not present

## 2017-03-10 DIAGNOSIS — M71571 Other bursitis, not elsewhere classified, right ankle and foot: Secondary | ICD-10-CM | POA: Diagnosis not present

## 2017-03-20 DIAGNOSIS — Z952 Presence of prosthetic heart valve: Secondary | ICD-10-CM | POA: Diagnosis not present

## 2017-03-20 DIAGNOSIS — Z7901 Long term (current) use of anticoagulants: Secondary | ICD-10-CM | POA: Diagnosis not present

## 2017-03-28 DIAGNOSIS — M722 Plantar fascial fibromatosis: Secondary | ICD-10-CM | POA: Diagnosis not present

## 2017-03-28 DIAGNOSIS — M65872 Other synovitis and tenosynovitis, left ankle and foot: Secondary | ICD-10-CM | POA: Diagnosis not present

## 2017-03-28 DIAGNOSIS — M67912 Unspecified disorder of synovium and tendon, left shoulder: Secondary | ICD-10-CM | POA: Diagnosis not present

## 2017-04-11 ENCOUNTER — Encounter: Payer: Self-pay | Admitting: Registered Nurse

## 2017-04-11 ENCOUNTER — Encounter: Payer: Medicare Other | Attending: Physical Medicine & Rehabilitation | Admitting: Registered Nurse

## 2017-04-11 ENCOUNTER — Other Ambulatory Visit: Payer: Self-pay

## 2017-04-11 VITALS — BP 110/70 | HR 67

## 2017-04-11 DIAGNOSIS — G894 Chronic pain syndrome: Secondary | ICD-10-CM

## 2017-04-11 DIAGNOSIS — Z79899 Other long term (current) drug therapy: Secondary | ICD-10-CM

## 2017-04-11 DIAGNOSIS — M47817 Spondylosis without myelopathy or radiculopathy, lumbosacral region: Secondary | ICD-10-CM

## 2017-04-11 DIAGNOSIS — M47816 Spondylosis without myelopathy or radiculopathy, lumbar region: Secondary | ICD-10-CM | POA: Insufficient documentation

## 2017-04-11 DIAGNOSIS — Z5181 Encounter for therapeutic drug level monitoring: Secondary | ICD-10-CM

## 2017-04-11 DIAGNOSIS — M545 Low back pain: Secondary | ICD-10-CM | POA: Diagnosis not present

## 2017-04-11 DIAGNOSIS — M62830 Muscle spasm of back: Secondary | ICD-10-CM | POA: Diagnosis not present

## 2017-04-11 DIAGNOSIS — G8929 Other chronic pain: Secondary | ICD-10-CM

## 2017-04-11 DIAGNOSIS — M5416 Radiculopathy, lumbar region: Secondary | ICD-10-CM | POA: Diagnosis not present

## 2017-04-11 MED ORDER — ACETAMINOPHEN-CODEINE #4 300-60 MG PO TABS: 1.0000 | ORAL_TABLET | Freq: Four times a day (QID) | ORAL | 2 refills | Status: DC | PRN

## 2017-04-11 MED ORDER — BACLOFEN 10 MG PO TABS: 5.0000 mg | ORAL_TABLET | Freq: Every evening | ORAL | 1 refills | Status: DC | PRN

## 2017-04-11 NOTE — Patient Instructions (Signed)
Only Take the Baclofen at Night for Muscle Spasm

## 2017-04-11 NOTE — Progress Notes (Signed)
Subjective:    Patient ID: Emily Peck, female    DOB: 30-Mar-1955, 62 y.o.   MRN: [id number]  HPI: Emily Peck is a 62 year old female who returns for follow up appointment  for chronic pain and medication refill. She states her pain is located in her lower back radiating into her right lower extremity and right knee pain. Also reports increase frequency and intensity of lower back pain and muscle spasms. We will order a X-ray and  in the past she has been prescribe flexeril and tizanidine they were ineffective. We will prescribe baclofen for HS only. She rates her pain 4. Her current exercise regime is walking.   Emily Peck Morphine equivalent is 34.80 MME.  Emily Peck works full time at the [employer].     Pain Inventory Average Pain 7 Pain Right Now 4 My pain is stabbing and aching  In the last 24 hours, has pain interfered with the following? General activity 9 Relation with others 9 Enjoyment of life 9 What TIME of day is your pain at its worst? daytime Sleep (in general) Good  Pain is worse with: walking, bending, standing and some activites Pain improves with: rest, medication and injections Relief from Meds: 4  Mobility use a cane ability to climb steps?  yes do you drive?  no  Function I need assistance with the following:  household duties and shopping  Neuro/Psych trouble walking spasms  Prior Studies Any changes since last visit?  no  Physicians involved in your care Any changes since last visit?  no   Family History  Problem Relation Age of Onset  . Cancer Mother   . Diabetes Mother    Social History   Socioeconomic History  . Marital status: Single    Spouse name: None  . Number of children: None  . Years of education: None  . Highest education level: None  Social Needs  . Financial resource strain: None  . Food insecurity - worry: None  . Food insecurity - inability: None  . Transportation needs - medical: None    . Transportation needs - non-medical: None  Occupational History  . None  Tobacco Use  . Smoking status: Never Smoker  . Smokeless tobacco: Never Used  Substance and Sexual Activity  . Alcohol use: No  . Drug use: No  . Sexual activity: None  Other Topics Concern  . None  Social History Narrative  . None   Past Surgical History:  Procedure Laterality Date  . ABDOMINAL HYSTERECTOMY    . BACK SURGERY    . EYE SURGERY     eye removed right   . KNEE ARTHROSCOPY    . 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   Procedure: TOTAL KNEE ARTHROPLASTY;  Surgeon: Melrose Nakayama, MD;  Location: [facility];  Service: Orthopedics;  Laterality: Right;   Past Medical History:  Diagnosis Date  . Anxiety   . Clotting disorder ([place])   . Depression   . Dyspnea   . GERD (gastroesophageal reflux disease)   . Headache   . Lumbosacral spondylosis without myelopathy   . Neuropathy   . Postlaminectomy syndrome, lumbar region   . Postlaminectomy syndrome, thoracic region   . Stroke ([place])    BP 110/70   Pulse 67   SpO2 94%   Opioid Risk Score:  8 Fall Risk Score:  `1  Depression screen PHQ 2/9  Depression screen [facility] 2/9 04/11/2017 01/03/2017 06/05/2015 05/08/2015 11/10/2014  Decreased  Interest 0 0 2 2 0  Down, Depressed, Hopeless 0 0 1 1 0  PHQ - 2 Score 0 0 3 3 0  Altered sleeping - - - 2 -  Tired, decreased energy - - - 2 -  Change in appetite - - - 2 -  Feeling bad or failure about yourself  - - - 2 -  Trouble concentrating - - - 2 -  Moving slowly or fidgety/restless - - - 1 -  Suicidal thoughts - - - 0 -  PHQ-9 Score - - - 14 -  Difficult doing work/chores - - - Somewhat difficult -  Some recent data might be hidden     Review of Systems  Constitutional: Negative.   HENT: Negative.   Eyes: Negative.   Respiratory: Negative.   Cardiovascular: Negative.   Gastrointestinal: Negative.   Endocrine: Negative.   Musculoskeletal: Positive for back pain.       Spasms  Skin: Negative.    Allergic/Immunologic: Negative.   Neurological: Negative.   Hematological: Negative.   Psychiatric/Behavioral: Negative.   All other systems reviewed and are negative.      Objective:   Physical Exam  Constitutional: She is oriented to person, place, and time. She appears well-developed and well-nourished.  HENT:  Head: Normocephalic and atraumatic.  Neck: Normal range of motion. Neck supple.  Cardiovascular: Normal rate and regular rhythm.  Pulmonary/Chest: Effort normal and breath sounds normal.  Musculoskeletal:  Normal Muscle Bulk and Muscle Testing Reveals: Upper Extremities: Full ROM and Muscle Strength 5/5 Lumbar Paraspinal Tenderness: L-3-L-5 Lower Extremities: Full ROM and Muscle Strength 5/5 Arises from chair slowly using straight cane for support Narrow Based Gait   Neurological: She is alert and oriented to person, place, and time.  Skin: Skin is warm and dry.  Psychiatric: She has a normal mood and affect.  Nursing note and vitals reviewed.         Assessment & Plan:  1. 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 Refilled:Tylenol #4 one tablet 4 times a day as needed #120 04/11/2017 2. Greater Trochanteric Bursitis:No complaints Today. Continue with Heat/ Ice Therapy. 04/11/2017 3. Right Lumbar Radiculitis: Continue Gabapentin. S/ P Right MBB with relief noted. 04/11/2017 4. Acute Exacerbation of Chronic Low Back Pain: RX: Lumbar X-ray. 04/11/2017 5. Muscle Spasm: RX: Baclofen at HS Only. 04/11/2017.  20 minutes of face to face patient care time was spent during this visit. All questions were encouraged and answered.  F/U in 1 months

## 2017-04-16 DIAGNOSIS — F315 Bipolar disorder, current episode depressed, severe, with psychotic features: Secondary | ICD-10-CM | POA: Diagnosis not present

## 2017-04-18 DIAGNOSIS — Z7901 Long term (current) use of anticoagulants: Secondary | ICD-10-CM | POA: Diagnosis not present

## 2017-04-18 DIAGNOSIS — Z952 Presence of prosthetic heart valve: Secondary | ICD-10-CM | POA: Diagnosis not present

## 2017-04-21 DIAGNOSIS — M71572 Other bursitis, not elsewhere classified, left ankle and foot: Secondary | ICD-10-CM | POA: Diagnosis not present

## 2017-04-21 DIAGNOSIS — M65872 Other synovitis and tenosynovitis, left ankle and foot: Secondary | ICD-10-CM | POA: Diagnosis not present

## 2017-04-21 DIAGNOSIS — M71571 Other bursitis, not elsewhere classified, right ankle and foot: Secondary | ICD-10-CM | POA: Diagnosis not present

## 2017-05-17 DIAGNOSIS — Z7901 Long term (current) use of anticoagulants: Secondary | ICD-10-CM | POA: Diagnosis not present

## 2017-05-17 DIAGNOSIS — Z952 Presence of prosthetic heart valve: Secondary | ICD-10-CM | POA: Diagnosis not present

## 2017-07-02 ENCOUNTER — Other Ambulatory Visit: Payer: Self-pay | Admitting: Family

## 2017-07-02 DIAGNOSIS — Z7901 Long term (current) use of anticoagulants: Secondary | ICD-10-CM | POA: Diagnosis not present

## 2017-07-02 DIAGNOSIS — R0981 Nasal congestion: Secondary | ICD-10-CM | POA: Diagnosis not present

## 2017-07-02 DIAGNOSIS — M79601 Pain in right arm: Secondary | ICD-10-CM | POA: Diagnosis not present

## 2017-07-02 DIAGNOSIS — R05 Cough: Secondary | ICD-10-CM | POA: Diagnosis not present

## 2017-07-02 DIAGNOSIS — M25552 Pain in left hip: Secondary | ICD-10-CM | POA: Diagnosis not present

## 2017-07-02 DIAGNOSIS — Z1231 Encounter for screening mammogram for malignant neoplasm of breast: Secondary | ICD-10-CM

## 2017-07-02 DIAGNOSIS — R5383 Other fatigue: Secondary | ICD-10-CM | POA: Diagnosis not present

## 2017-07-03 DIAGNOSIS — M19011 Primary osteoarthritis, right shoulder: Secondary | ICD-10-CM | POA: Diagnosis not present

## 2017-07-03 DIAGNOSIS — R918 Other nonspecific abnormal finding of lung field: Secondary | ICD-10-CM | POA: Diagnosis not present

## 2017-07-03 DIAGNOSIS — M160 Bilateral primary osteoarthritis of hip: Secondary | ICD-10-CM | POA: Diagnosis not present

## 2017-07-03 DIAGNOSIS — M25511 Pain in right shoulder: Secondary | ICD-10-CM | POA: Diagnosis not present

## 2017-07-04 DIAGNOSIS — Z7901 Long term (current) use of anticoagulants: Secondary | ICD-10-CM | POA: Diagnosis not present

## 2017-07-04 DIAGNOSIS — Z952 Presence of prosthetic heart valve: Secondary | ICD-10-CM | POA: Diagnosis not present

## 2017-07-07 ENCOUNTER — Encounter: Payer: Self-pay | Admitting: Registered Nurse

## 2017-07-07 ENCOUNTER — Ambulatory Visit
Admission: RE | Admit: 2017-07-07 | Discharge: 2017-07-07 | Disposition: A | Discharge status: Home / Self Care | Payer: Medicare Other | Source: Ambulatory Visit | Attending: Family | Admitting: Family

## 2017-07-07 ENCOUNTER — Encounter: Payer: Medicare Other | Attending: Physical Medicine & Rehabilitation | Admitting: Registered Nurse

## 2017-07-07 VITALS — BP 126/80 | HR 68 | Resp 14 | Ht 66.0 in | Wt 202.0 lb

## 2017-07-07 DIAGNOSIS — W19XXXA Unspecified fall, initial encounter: Secondary | ICD-10-CM

## 2017-07-07 DIAGNOSIS — M25552 Pain in left hip: Secondary | ICD-10-CM | POA: Diagnosis not present

## 2017-07-07 DIAGNOSIS — Z79899 Other long term (current) drug therapy: Secondary | ICD-10-CM | POA: Diagnosis not present

## 2017-07-07 DIAGNOSIS — Y92009 Unspecified place in unspecified non-institutional (private) residence as the place of occurrence of the external cause: Secondary | ICD-10-CM

## 2017-07-07 DIAGNOSIS — M545 Low back pain: Secondary | ICD-10-CM | POA: Insufficient documentation

## 2017-07-07 DIAGNOSIS — M47816 Spondylosis without myelopathy or radiculopathy, lumbar region: Secondary | ICD-10-CM | POA: Insufficient documentation

## 2017-07-07 DIAGNOSIS — G894 Chronic pain syndrome: Secondary | ICD-10-CM

## 2017-07-07 DIAGNOSIS — Z5181 Encounter for therapeutic drug level monitoring: Secondary | ICD-10-CM

## 2017-07-07 DIAGNOSIS — Z1231 Encounter for screening mammogram for malignant neoplasm of breast: Secondary | ICD-10-CM

## 2017-07-07 DIAGNOSIS — M62830 Muscle spasm of back: Secondary | ICD-10-CM

## 2017-07-07 DIAGNOSIS — M47817 Spondylosis without myelopathy or radiculopathy, lumbosacral region: Secondary | ICD-10-CM | POA: Diagnosis not present

## 2017-07-07 MED ORDER — ACETAMINOPHEN-CODEINE #4 300-60 MG PO TABS: 1.0000 | ORAL_TABLET | Freq: Four times a day (QID) | ORAL | 2 refills | Status: DC | PRN

## 2017-07-07 NOTE — Progress Notes (Signed)
Subjective:     Patient ID: Emily Peck, female   DOB: 1955/07/25, 63 y.o.   MRN: [id number]  HPI: [name] is a 62 year old female who returns for follow up appointment for chronic pain and medication refill. She states her pain is located in her right shoulder and lower back. She rates her pain 3. Her current exercise regime is walking.  [name] reports three weeks ago she was walking in her home and tripped over something on the floor, she landed on her right shoulder and right hip. Ecchymosis noted on her right shoulder resolving, left hip hematoma noted and ecchymosis resolving. She followed up with her PCP and X-rays were taken she states. Educated on falls prevention she verbalizes understanding.   [name] Morphine Equivalent is 36.00 MME.  Pain Inventory Average Pain 4 Pain Right Now 3 My pain is constant  In the last 24 hours, has pain interfered with the following? General activity 9 Relation with others 7 Enjoyment of life 4 What TIME of day is your pain at its worst? daytime, evening  Sleep (in general) Fair  Pain is worse with: walking, standing and some activites Pain improves with: medication Relief from Meds: 5  Mobility walk with assistance use a cane  Function employed # of hrs/week .  Neuro/Psych trouble walking  Prior Studies Any changes since last visit?  no  Physicians involved in your care    Family History  Problem Relation Age of Onset  . Cancer Mother   . Diabetes Mother    Social History   Socioeconomic History  . Marital status: Single    Spouse name: Not on file  . Number of children: Not on file  . Years of education: Not on file  . Highest education level: Not on file  Occupational History  . Not on file  Social Needs  . Financial resource strain: Not on file  . Food insecurity:    Worry: Not on file    Inability: Not on file  . Transportation needs:    Medical: Not on file    Non-medical: Not on  file  Tobacco Use  . Smoking status: Never Smoker  . Smokeless tobacco: Never Used  Substance and Sexual Activity  . Alcohol use: No  . Drug use: No  . Sexual activity: Not on file  Lifestyle  . Physical activity:    Days per week: Not on file    Minutes per session: Not on file  . Stress: Not on file  Relationships  . Social connections:    Talks on phone: Not on file    Gets together: Not on file    Attends religious service: Not on file    Active member of club or organization: Not on file    Attends meetings of clubs or organizations: Not on file    Relationship status: Not on file  Other Topics Concern  . Not on file  Social History Narrative  . Not on file   Past Surgical History:  Procedure Laterality Date  . ABDOMINAL HYSTERECTOMY    . BACK SURGERY    . EYE SURGERY     eye removed right   . KNEE ARTHROSCOPY    . 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   Procedure: TOTAL KNEE ARTHROPLASTY;  Surgeon: Melrose Nakayama, MD;  Location: [facility];  Service: Orthopedics;  Laterality: Right;   Past Medical History:  Diagnosis Date  . Anxiety   . Clotting  disorder ([place])   . Depression   . Dyspnea   . GERD (gastroesophageal reflux disease)   . Headache   . Lumbosacral spondylosis without myelopathy   . Neuropathy   . Postlaminectomy syndrome, lumbar region   . Postlaminectomy syndrome, thoracic region   . Stroke ([place])    BP 126/80 (BP Location: Left Arm, Patient Position: Sitting, Cuff Size: Normal)   Pulse 68   Resp 14   Ht 5\' 6"  (1.676 m)   Wt 202 lb (91.6 kg)   SpO2 97%   BMI 32.60 kg/m   Opioid Risk Score:   Fall Risk Score:  `1  Depression screen PHQ 2/9  Depression screen [facility] 2/9 07/07/2017 04/11/2017 01/03/2017 06/05/2015 05/08/2015 11/10/2014  Decreased Interest 0 0 0 2 2 0  Down, Depressed, Hopeless - 0 0 1 1 0  PHQ - 2 Score 0 0 0 3 3 0  Altered sleeping - - - - 2 -  Tired, decreased energy - - - - 2 -  Change in appetite - - - - 2 -  Feeling bad or  failure about yourself  - - - - 2 -  Trouble concentrating - - - - 2 -  Moving slowly or fidgety/restless - - - - 1 -  Suicidal thoughts - - - - 0 -  PHQ-9 Score - - - - 14 -  Difficult doing work/chores - - - - Somewhat difficult -  Some recent data might be hidden    Review of Systems  Constitutional: Negative.   HENT: Negative.   Eyes: Negative.   Respiratory: Negative.   Cardiovascular: Negative.   Gastrointestinal: Negative.   Endocrine: Negative.   Genitourinary: Negative.   Musculoskeletal: Positive for back pain and gait problem.  Skin: Negative.   Allergic/Immunologic: Negative.   Psychiatric/Behavioral: Negative.   All other systems reviewed and are negative.      Objective:   Physical Exam  Constitutional: She is oriented to person, place, and time. She appears well-developed and well-nourished.  HENT:  Head: Normocephalic and atraumatic.  Neck: Normal range of motion. Neck supple.  Cardiovascular: Normal rate and regular rhythm.  Pulmonary/Chest: Effort normal and breath sounds normal.  Musculoskeletal:  Normal Muscle Bulk and Muscle Testing Reveals: Upper Extremities: Full ROM and Muscle Strength 5/5 Lumbar Paraspinal Tenderness Left Greater Trochanter Tenderness Lower Extremities: Full ROM and Muscle Strength 5/5 Arises from Table Slowly using cane for support Narrow Based Gait  Neurological: She is alert and oriented to person, place, and time.  Skin: Skin is warm and dry.  Psychiatric: She has a normal mood and affect.  Nursing note and vitals reviewed.      Assessment:   Plan:    1. 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 Refilled:Tylenol #4 one tablet 4 times a day as needed #120 07/07/2017 2. Left Hip Pain / S/P Fall :Continue with Heat/ Ice Therapy. 07/07/2017 3. Lumbosacral Spondylosis/Right Lumbar Radiculitis: Continue current medication regimen with Gabapentin. S/ P Right MBB with relief noted. 02/07/2017 4.  Muscle Spasm: Continue current medication regimen withBaclofen at HS Only. 07/07/2017. 5. Falls: Educated on [employer]. 07/07/2017   20 minutes of face to face patient care time was spent during this visit. All questions were encouraged and answered.  F/U in 1 months

## 2017-07-11 ENCOUNTER — Ambulatory Visit: Payer: Medicare Other | Admitting: Nurse Practitioner

## 2017-07-18 DIAGNOSIS — R05 Cough: Secondary | ICD-10-CM | POA: Diagnosis not present

## 2017-07-21 DIAGNOSIS — F315 Bipolar disorder, current episode depressed, severe, with psychotic features: Secondary | ICD-10-CM | POA: Diagnosis not present

## 2017-08-04 DIAGNOSIS — Z952 Presence of prosthetic heart valve: Secondary | ICD-10-CM | POA: Diagnosis not present

## 2017-08-04 DIAGNOSIS — Z7901 Long term (current) use of anticoagulants: Secondary | ICD-10-CM | POA: Diagnosis not present

## 2017-08-29 DIAGNOSIS — M7541 Impingement syndrome of right shoulder: Secondary | ICD-10-CM | POA: Diagnosis not present

## 2017-09-03 DIAGNOSIS — Z952 Presence of prosthetic heart valve: Secondary | ICD-10-CM | POA: Diagnosis not present

## 2017-09-03 DIAGNOSIS — Z7901 Long term (current) use of anticoagulants: Secondary | ICD-10-CM | POA: Diagnosis not present

## 2017-10-03 DIAGNOSIS — M7541 Impingement syndrome of right shoulder: Secondary | ICD-10-CM | POA: Diagnosis not present

## 2017-10-03 DIAGNOSIS — M79642 Pain in left hand: Secondary | ICD-10-CM | POA: Diagnosis not present

## 2017-10-03 DIAGNOSIS — M79641 Pain in right hand: Secondary | ICD-10-CM | POA: Diagnosis not present

## 2017-10-05 DIAGNOSIS — Z952 Presence of prosthetic heart valve: Secondary | ICD-10-CM | POA: Diagnosis not present

## 2017-10-05 DIAGNOSIS — Z7901 Long term (current) use of anticoagulants: Secondary | ICD-10-CM | POA: Diagnosis not present

## 2017-10-07 ENCOUNTER — Encounter: Payer: Self-pay | Admitting: Physical Medicine & Rehabilitation

## 2017-10-07 ENCOUNTER — Encounter: Payer: Medicare Other | Attending: Physical Medicine & Rehabilitation

## 2017-10-07 ENCOUNTER — Ambulatory Visit (HOSPITAL_BASED_OUTPATIENT_CLINIC_OR_DEPARTMENT_OTHER): Payer: Medicare Other | Admitting: Physical Medicine & Rehabilitation

## 2017-10-07 VITALS — BP 106/71 | HR 65 | Ht 66.0 in | Wt 200.0 lb

## 2017-10-07 DIAGNOSIS — M47816 Spondylosis without myelopathy or radiculopathy, lumbar region: Secondary | ICD-10-CM | POA: Insufficient documentation

## 2017-10-07 DIAGNOSIS — M47817 Spondylosis without myelopathy or radiculopathy, lumbosacral region: Secondary | ICD-10-CM | POA: Diagnosis not present

## 2017-10-07 DIAGNOSIS — M10071 Idiopathic gout, right ankle and foot: Secondary | ICD-10-CM | POA: Diagnosis not present

## 2017-10-07 DIAGNOSIS — M545 Low back pain: Secondary | ICD-10-CM | POA: Diagnosis not present

## 2017-10-07 DIAGNOSIS — M7752 Other enthesopathy of left foot: Secondary | ICD-10-CM | POA: Diagnosis not present

## 2017-10-07 DIAGNOSIS — M7751 Other enthesopathy of right foot: Secondary | ICD-10-CM | POA: Diagnosis not present

## 2017-10-07 DIAGNOSIS — M7989 Other specified soft tissue disorders: Secondary | ICD-10-CM | POA: Diagnosis not present

## 2017-10-07 DIAGNOSIS — M10072 Idiopathic gout, left ankle and foot: Secondary | ICD-10-CM | POA: Diagnosis not present

## 2017-10-07 MED ORDER — ACETAMINOPHEN-CODEINE #4 300-60 MG PO TABS: 1.0000 | ORAL_TABLET | Freq: Four times a day (QID) | ORAL | 2 refills | Status: DC | PRN

## 2017-10-07 NOTE — Progress Notes (Signed)
Subjective:    Patient ID: Emily Peck, female    DOB: Dec 05, 1955, 62 y.o.   MRN: [id number]  HPI  62 year old female who is legally blind and has chronic low back pain presents today with chief complaint with left-sided low back pain.  Her pain has been primarily on the right side and has been relieved to a great degree by right L5 dorsal ramus right L3-L4 to branch radiofrequency neurotomies performed on 07/05/2016 and 02/07/2017.  Patient states that her right-sided low back pain has been fairly well controlled but her left side is getting worse.  She denies any falls or trauma. She has no pain radiating down the legs.  She has no bowel or bladder dysfunction. Left sided low back pain  > Right sided Most recent imaging study pertinent to the low back area was a CT scan performed on 02/07/2014.   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 General activity 7 Relation with others 9 Enjoyment of life 6 What TIME of day is your pain at its worst? na Sleep (in general) Fair  Pain is worse with: walking and standing Pain improves with: na Relief from Meds: 8  Mobility use a cane  Function disabled: date disabled .  Neuro/Psych trouble walking  Prior Studies Any changes since last visit?  no  Physicians involved in your care Any changes since last visit?  no   Family History  Problem Relation Age of Onset  . Cancer Mother   . Diabetes Mother    Social History   Socioeconomic History  . Marital status: Single    Spouse name: Not on file  . Number of children: Not on file  . Years of education: Not on file  . Highest education level: Not on file  Occupational History  . Not on file  Social Needs  . Financial resource  strain: Not on file  . Food insecurity:    Worry: Not on file    Inability: Not on file  . Transportation needs:    Medical: Not on file    Non-medical: Not on file  Tobacco Use  . Smoking status: Never Smoker  . Smokeless tobacco: Never Used  Substance and Sexual Activity  . Alcohol use: No  . Drug use: No  . Sexual activity: Not on file  Lifestyle  . Physical activity:    Days per week: Not on file    Minutes per session: Not on file  . Stress: Not on file  Relationships  . Social connections:    Talks on phone: Not on file    Gets together: Not on file    Attends religious service: Not on file    Active member of club or organization: Not on file    Attends meetings of clubs or organizations: Not on file    Relationship status: Not on file  Other Topics Concern  . Not on file  Social History Narrative  . Not on file   Past Surgical History:  Procedure Laterality Date  . ABDOMINAL HYSTERECTOMY    . BACK SURGERY    . EYE SURGERY     eye removed right   . KNEE ARTHROSCOPY    .  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   Procedure: TOTAL KNEE ARTHROPLASTY;  Surgeon: Melrose Nakayama, MD;  Location: [place];  Service: Orthopedics;  Laterality: Right;   Past Medical History:  Diagnosis Date  . Anxiety   . Clotting disorder ([place])   . Depression   . Dyspnea   . GERD (gastroesophageal reflux disease)   . Headache   . Lumbosacral spondylosis without myelopathy   . Neuropathy   . Postlaminectomy syndrome, lumbar region   . Postlaminectomy syndrome, thoracic region   . Stroke ([facility])    BP 106/71   Pulse 65   Ht 5\' 6"  (1.676 m)   Wt 200 lb (90.7 kg)   SpO2 98%   BMI 32.28 kg/m   Opioid Risk Score:   Fall Risk Score:  `1  Depression screen PHQ 2/9  Depression screen [facility] 2/9 07/07/2017 04/11/2017 01/03/2017 06/05/2015 05/08/2015 11/10/2014  Decreased Interest 0 0 0 2 2 0  Down, Depressed, Hopeless - 0 0 1 1 0  PHQ - 2 Score 0 0 0 3 3 0  Altered sleeping - - - - 2 -  Tired,  decreased energy - - - - 2 -  Change in appetite - - - - 2 -  Feeling bad or failure about yourself  - - - - 2 -  Trouble concentrating - - - - 2 -  Moving slowly or fidgety/restless - - - - 1 -  Suicidal thoughts - - - - 0 -  PHQ-9 Score - - - - 14 -  Difficult doing work/chores - - - - Somewhat difficult -  Some recent data might be hidden     Review of Systems  Constitutional: Negative.   HENT: Negative.   Eyes: Negative.   Respiratory: Negative.   Cardiovascular: Negative.   Gastrointestinal: Negative.   Endocrine: Negative.   Genitourinary: Negative.   Musculoskeletal: Positive for arthralgias, back pain, gait problem, joint swelling, myalgias and neck pain.  Skin: Negative.   Allergic/Immunologic: Negative.   Hematological: Negative.   Psychiatric/Behavioral: Negative.   All other systems reviewed and are negative.      Objective:   Physical Exam  Constitutional: She is oriented to person, place, and time. She appears well-developed and well-nourished. No distress.  HENT:  Head: Normocephalic and atraumatic.  Eyes: Pupils are equal, round, and reactive to light. EOM are normal.  Neck: Normal range of motion.  Patient has pain to palpation along the C7 spinous process as well as the upper trapezius area bilaterally.  Musculoskeletal:  Lumbar spine has tenderness palpation on the left side at L3-L4-L5 or spinal area. No significant tenderness on the right side. Lumbar range of motion is limited to 50% flexion extension lateral bending and rotation.  Neurological: She is alert and oriented to person, place, and time. She has normal strength. Coordination and gait normal.  Motor strength is 5/5 bilateral deltoid, bicep, triceps grip, hip flexor, knee extensor, wrist flexor   Skin: Skin is warm and dry. She is not diaphoretic.  Psychiatric: She has a normal mood and affect.  Nursing note and vitals reviewed.         Assessment & Plan:  1.  Lumbar spondylosis  without myelopathy.  She has had primarily right-sided symptoms but is starting to develop left-sided symptoms.  Radiologically patient looks symmetric in terms of facet arthropathy mainly at L3-4 L4-5.  Symptomatically appears to involve L5-S1 as well. Will set up for L2 L3-L4-L5 medial branch blocks left side.  If temporary relief would repeat and if temporary relief of at least 50% on 2 occasions then would proceed to radiofrequency neurotomy.  I discussed with the patient she agrees with plan.

## 2017-10-07 NOTE — Patient Instructions (Addendum)
Do not need to stop Coumadin for your injections  Will do test block for Left side twice before we can burn the nerves  Use a ball under your neck and shoulder area to roll on or use your chair massager

## 2017-10-14 NOTE — Progress Notes (Signed)
[facility]  PATIENT: Emily Peck DOB: 1955-07-09   REASON FOR VISIT: Follow-up for neuropathy HISTORY FROM: Patient    HISTORY OF PRESENT ILLNESS:UPDATE 8/14/2019CM Emily Peck, 62 year old female returns for follow-up with polyneuropathy in her feet for years which has progressed over time.  It is a pins-and-needles sensation.  She is currently on gabapentin with benefit.  She continues to see Dr. Deanna Artis for chronic low back pain and lumbar medial branch blocks.  She has a appointment in September.  She claims the injections last about 6 months.  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  She has seen a specialist at [facility] that has said nothing else can be done for her vision.  She was encouraged to be safe with her ambulation.  She ambulates with a single-point cane.  She returns for reevaluation    UPDATE 05/04/2018CM Emily Peck, 62 year old female returns for follow-up with history of polyneuropathy in her feet for years which has progressively worsened over time. She has a pins and needles burning in the feet, used to be only on the bottom of the feet balance on the top of the feet as well.She had right knee replacement November 2017. She is still in physical therapy. She continues to see Dr.Kirsteins  for chronic low back pain and receives lumbar medial branch blocks. She has an appointment today. She says the injections last about 6 months. She continues to have lot of left leg pain due to her knee surgery. She denies any falls. She is ambulating with single-point cane. She returns for reevaluation  HISTORY 10/19/15 AACherry L Peck is a 62 y.o. female here as a referral from Dr. Baird Cancer for neuropathy in the feet. PMHx of hyperlipidemia, chronic lumbar radiculopathy, polyneuropathy, obesity, thoracic compression fractures T10-T11 s/pT12-L1 fusion, depression, glaucoma, retinal  detachment, macular degeneration and poor vision.. She has had neuropathy in the feet for years. Progressively worsening, continuous. Pins and needles, burning in the feet, it used to be under the bottom now it on the top also. She denies diabetes or using any medications that can cause neuropathy she has never been exposed to toxins that can cause neuropathy. Mother was diabetic and had neuropathy. Associated ith cramps in the feet. The burning is the worst. Worse at night. The gabapentin helps. She takes 600mg  4x a day. Continous all day. Unknown inciting events. No medications that can induce neuropathy. No alcohol use. She takes 600mg  neurontin 4x a day.     REVIEW OF SYSTEMS: Full 14 system review of systems performed and notable only for those listed, all others are neg:  Constitutional: Fatigue Cardiovascular: neg Ear/Nose/Throat: Hearing loss Skin: neg Eyes: Prosthetic right eye decreased vision in the left Respiratory: neg Gastroitestinal: neg  Hematology/Lymphatic: neg  Endocrine: [place] Musculoskeletal: Chronic back pain Allergy/Immunology: neg Neurological: Paresthesias Psychiatric: neg Sleep : neg   ALLERGIES: Allergies  Allergen Reactions  . No Known Allergies     HOME MEDICATIONS: Outpatient Medications Prior to Visit  Medication Sig Dispense Refill  . acetaminophen-codeine (TYLENOL #4) 300-60 MG tablet Take 1 tablet by mouth 4 (four) times daily as needed for moderate pain. 120 tablet 2  . Camphor-Eucalyptus-Menthol (VICKS VAPORUB EX) Apply topically. Uses on feet prn for pain  (family dollar brand)    . escitalopram (LEXAPRO) 20 MG tablet Take 20 mg by mouth at bedtime.   0  . gabapentin (NEURONTIN) 600  MG tablet take 2 tablets by mouth three times a day 180 tablet 7  . topiramate (TOPAMAX) 200 MG tablet Take 200 mg by mouth at bedtime.   0  . traZODone (DESYREL) 150 MG tablet Take 150 mg by mouth at bedtime.  0  . warfarin (COUMADIN) 6 MG tablet Take 6-9 mg by  mouth daily. Take 9mg  Monday, Wednesday, and Fridays, then all other days take 6mg s daily    . baclofen (LIORESAL) 10 MG tablet Take 0.5 tablets (5 mg total) by mouth at bedtime as needed for muscle spasms. (Patient not taking: Reported on 10/15/2017) 15 each 1  . methocarbamol (ROBAXIN) 500 MG tablet Take 1 tablet (500 mg total) by mouth every 6 (six) hours as needed for muscle spasms. (Patient not taking: Reported on 10/15/2017) 40 tablet 0   No facility-administered medications prior to visit.     PAST MEDICAL HISTORY: Past Medical History:  Diagnosis Date  . Anxiety   . Clotting disorder ([place])   . Depression   . Dyspnea   . GERD (gastroesophageal reflux disease)   . Headache   . Lumbosacral spondylosis without myelopathy   . Neuropathy   . Postlaminectomy syndrome, lumbar region   . Postlaminectomy syndrome, thoracic region   . Stroke [facility])     PAST SURGICAL HISTORY: Past Surgical History:  Procedure Laterality Date  . ABDOMINAL HYSTERECTOMY    . BACK SURGERY    . EYE SURGERY     eye removed right   . KNEE ARTHROSCOPY    . 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   Procedure: TOTAL KNEE ARTHROPLASTY;  Surgeon: Melrose Nakayama, MD;  Location: [place];  Service: Orthopedics;  Laterality: Right;    FAMILY HISTORY: Family History  Problem Relation Age of Onset  . Cancer Mother   . Diabetes Mother     SOCIAL HISTORY: Social History   Socioeconomic History  . Marital status: Single    Spouse name: Not on file  . Number of children: Not on file  . Years of education: Not on file  . Highest education level: Not on file  Occupational History  . Not on file  Social Needs  . Financial resource strain: Not on file  . Food insecurity:    Worry: Not on file    Inability: Not on file  . Transportation needs:    Medical: Not on file    Non-medical: Not on file  Tobacco Use  . Smoking status: Never Smoker  . Smokeless tobacco: Never Used  Substance and Sexual Activity  .  Alcohol use: No  . Drug use: No  . Sexual activity: Not on file  Lifestyle  . Physical activity:    Days per week: Not on file    Minutes per session: Not on file  . Stress: Not on file  Relationships  . Social connections:    Talks on phone: Not on file    Gets together: Not on file    Attends religious service: Not on file    Active member of club or organization: Not on file    Attends meetings of clubs or organizations: Not on file    Relationship status: Not on file  . Intimate partner violence:    Fear of current or ex partner: Not on file    Emotionally abused: Not on file    Physically abused: Not on file    Forced sexual activity: Not on file  Other Topics Concern  . Not on file  Social History Narrative  . Not on file     PHYSICAL EXAM  Vitals:   10/15/17 1247  BP: 94/66  Pulse: 66  Weight: 201 lb 3.2 oz (91.3 kg)  Height: 5\' 6"  (1.676 m)   Body mass index is 32.47 kg/m.  Generalized: Well developed, Obese female in no acute distress, well-groomed  Head: normocephalic and atraumatic,. Oropharynx benign  Neck: Supple, Musculoskeletal: No deformity   Neurological examination   Mentation: Alert oriented to time, place, history taking. Attention span and concentration appropriate. Recent and remote memory intact.  Follows all commands speech and language fluent.   Cranial nerve II-XII: Prosthetic right eye , left pupil reactive visual fields are impaired and the patient is blind in the right eye extraocular movements are largely intact with impairment of elevation  Facial sensation and strength were normal. hearing was intact to finger rubbing bilaterally. Uvula tongue midline. head turning and shoulder shrug were normal and symmetric.Tongue protrusion into cheek strength was normal. Motor: normal bulk and tone, full strength in the BUE, BLE,  Sensory: Decreased pinprick and temperature diminished,, intact vibration and proprioception  Coordination:  finger-nose-finger, heel-to-shin bilaterally, no dysmetria Reflexes: Symmetric upper and lower except absent ankle jerks, plantar responses were flexor bilaterally. Gait and Station: Rising up from seated position with push off, wide based and mildly unsteady gait with single-point cane  Tandem gait is unsteady  DIAGNOSTIC DATA (LABS, IMAGING, TESTING) - I reviewed patient records, labs, notes, testing and imaging myself where available.  Lab Results  Component Value Date   WBC 11.9 (H) 01/09/2016   HGB 12.3 01/09/2016   HCT 37.3 01/09/2016   MCV 98.4 01/09/2016   PLT 281 01/09/2016      Component Value Date/Time   NA 138 12/29/2015 1453   K 3.3 (L) 12/29/2015 1453   CL 109 12/29/2015 1453   CO2 22 12/29/2015 1453   GLUCOSE 103 (H) 12/29/2015 1453   BUN 7 12/29/2015 1453   CREATININE 0.76 01/09/2016 1145   CALCIUM 9.5 12/29/2015 1453   PROT 7.2 10/19/2015 0811   ALBUMIN 4.1 03/09/2009 1241   AST 21 03/09/2009 1241   ALT 14 03/09/2009 1241   ALKPHOS 67 03/09/2009 1241   BILITOT 0.7 03/09/2009 1241   GFRNONAA >60 01/09/2016 1145   GFRAA >60 01/09/2016 1145    Lab Results  Component Value Date   HGBA1C 5.5 10/19/2015    ASSESSMENT AND PLAN  62 y.o. female here for neuropathy in the feet. PMHx of hyperlipidemia, chronic lumbar radiculopathy, polyneuropathy, obesity, thoracic compression fractures T10-T11 s/pT12-L1 fusion, depression, glaucoma, retinal detachment, macular degeneration and poor vision.. She has had neuropathy in the feet for years. Progressively worsening, continuous.Exam significant for distal sensory deficits and a small fiber distribution, intact vibration and proprioception which are more large fiber.   PLAN: Continue gabapentin at current dose will  refill Continue  home exercise program Use cane at all times for safe ambulation, patient also has decreased vision Follow-up yearly  Dennie Bible, [facility], [facility], APRN  [facility] [address] [place], [place] [address] [phone]

## 2017-10-15 ENCOUNTER — Encounter: Payer: Self-pay | Admitting: Nurse Practitioner

## 2017-10-15 ENCOUNTER — Telehealth: Payer: Self-pay | Admitting: Nurse Practitioner

## 2017-10-15 ENCOUNTER — Ambulatory Visit (INDEPENDENT_AMBULATORY_CARE_PROVIDER_SITE_OTHER): Payer: Medicare Other | Admitting: Nurse Practitioner

## 2017-10-15 VITALS — BP 94/66 | HR 66 | Ht 66.0 in | Wt 201.2 lb

## 2017-10-15 DIAGNOSIS — G629 Polyneuropathy, unspecified: Secondary | ICD-10-CM

## 2017-10-15 MED ORDER — GABAPENTIN 600 MG PO TABS: 1200.0000 mg | ORAL_TABLET | Freq: Three times a day (TID) | ORAL | 3 refills | Status: DC

## 2017-10-15 NOTE — Patient Instructions (Signed)
Continue gabapentin at current dose will  refill Continue  home exercise program Follow-up yearly

## 2017-10-15 NOTE — Telephone Encounter (Signed)
Patient states she will call us to schedule her one year f/u

## 2017-10-15 NOTE — Progress Notes (Signed)
made any corrections needed to history, physical, neuro exam,assessment and agree with plan as stated above.    Antonia Ahern, MD 

## 2017-10-29 DIAGNOSIS — F315 Bipolar disorder, current episode depressed, severe, with psychotic features: Secondary | ICD-10-CM | POA: Diagnosis not present

## 2017-11-06 DIAGNOSIS — Z952 Presence of prosthetic heart valve: Secondary | ICD-10-CM | POA: Diagnosis not present

## 2017-11-06 DIAGNOSIS — Z7901 Long term (current) use of anticoagulants: Secondary | ICD-10-CM | POA: Diagnosis not present

## 2017-11-10 ENCOUNTER — Ambulatory Visit: Payer: Medicare Other | Admitting: Physical Medicine & Rehabilitation

## 2017-12-12 DIAGNOSIS — Z23 Encounter for immunization: Secondary | ICD-10-CM | POA: Diagnosis not present

## 2017-12-14 DIAGNOSIS — Z7901 Long term (current) use of anticoagulants: Secondary | ICD-10-CM | POA: Diagnosis not present

## 2017-12-14 DIAGNOSIS — Z952 Presence of prosthetic heart valve: Secondary | ICD-10-CM | POA: Diagnosis not present

## 2018-01-09 ENCOUNTER — Encounter: Payer: Medicare Other | Attending: Physical Medicine & Rehabilitation

## 2018-01-09 ENCOUNTER — Ambulatory Visit (HOSPITAL_BASED_OUTPATIENT_CLINIC_OR_DEPARTMENT_OTHER): Payer: Medicare Other | Admitting: Physical Medicine & Rehabilitation

## 2018-01-09 ENCOUNTER — Encounter: Payer: Self-pay | Admitting: Physical Medicine & Rehabilitation

## 2018-01-09 VITALS — BP 126/79 | HR 76 | Ht 66.0 in | Wt 205.0 lb

## 2018-01-09 DIAGNOSIS — Z79899 Other long term (current) drug therapy: Secondary | ICD-10-CM | POA: Diagnosis not present

## 2018-01-09 DIAGNOSIS — M47816 Spondylosis without myelopathy or radiculopathy, lumbar region: Secondary | ICD-10-CM | POA: Diagnosis not present

## 2018-01-09 DIAGNOSIS — M75101 Unspecified rotator cuff tear or rupture of right shoulder, not specified as traumatic: Secondary | ICD-10-CM | POA: Diagnosis not present

## 2018-01-09 DIAGNOSIS — M47817 Spondylosis without myelopathy or radiculopathy, lumbosacral region: Secondary | ICD-10-CM | POA: Diagnosis not present

## 2018-01-09 DIAGNOSIS — Z5181 Encounter for therapeutic drug level monitoring: Secondary | ICD-10-CM | POA: Diagnosis not present

## 2018-01-09 DIAGNOSIS — IMO0002 Reserved for concepts with insufficient information to code with codable children: Secondary | ICD-10-CM

## 2018-01-09 DIAGNOSIS — M545 Low back pain: Secondary | ICD-10-CM | POA: Insufficient documentation

## 2018-01-09 MED ORDER — ACETAMINOPHEN-CODEINE #4 300-60 MG PO TABS: 1.0000 | ORAL_TABLET | Freq: Four times a day (QID) | ORAL | 2 refills | Status: DC | PRN

## 2018-01-09 NOTE — Progress Notes (Signed)
Subjective:    Patient ID: Emily Peck, female    DOB: 22-Feb-1956, 62 y.o.   MRN: [id number]  HPI  62 year old female with history of lumbar spondylosis without myelopathy.  She has had good prolonged relief with left-sided radiofrequency neurotomy last performed September 15, 2015.  She has noted some increasing low back pain on the left side over the last several weeks.  She does have a history of right-sided lumbar pain and radiofrequency neurotomy at right L3-L4 as well as right L5 dorsal ramus radiofrequency performed last on 02/07/2017.  She feels this is still effective Visited son in [place] for 4 days,  Had a nice trip.Very sore in her low back and shoulder area.  She did a lot of walking more so than usual Pain Inventory Average Pain 5 Pain Right Now 8 My pain is constant, sharp and aching  In the last 24 hours, has pain interfered with the following? General activity 8 Relation with others 8 Enjoyment of life 8 What TIME of day is your pain at its worst? daytime Sleep (in general) Fair  Pain is worse with: walking, bending, standing and some activites Pain improves with: rest and medication Relief from Meds: 5  Mobility use a cane ability to climb steps?  no do you drive?  no  Function employed # of hrs/week 40  Neuro/Psych numbness tingling trouble walking spasms  Prior Studies Any changes since last visit?  no  Physicians involved in your care Any changes since last visit?  no   Family History  Problem Relation Age of Onset  . Cancer Mother   . Diabetes Mother    Social History   Socioeconomic History  . Marital status: Single    Spouse name: Not on file  . Number of children: Not on file  . Years of education: Not on file  . Highest education level: Not on file  Occupational History  . Not on file  Social Needs  . Financial resource strain: Not on file  . Food insecurity:    Worry: Not on file    Inability: Not on file  .  Transportation needs:    Medical: Not on file    Non-medical: Not on file  Tobacco Use  . Smoking status: Never Smoker  . Smokeless tobacco: Never Used  Substance and Sexual Activity  . Alcohol use: No  . Drug use: No  . Sexual activity: Not on file  Lifestyle  . Physical activity:    Days per week: Not on file    Minutes per session: Not on file  . Stress: Not on file  Relationships  . Social connections:    Talks on phone: Not on file    Gets together: Not on file    Attends religious service: Not on file    Active member of club or organization: Not on file    Attends meetings of clubs or organizations: Not on file    Relationship status: Not on file  Other Topics Concern  . Not on file  Social History Narrative  . Not on file   Past Surgical History:  Procedure Laterality Date  . ABDOMINAL HYSTERECTOMY    . BACK SURGERY    . EYE SURGERY     eye removed right   . KNEE ARTHROSCOPY    . 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   Procedure: TOTAL KNEE ARTHROPLASTY;  Surgeon: Melrose Nakayama, MD;  Location: [place];  Service: Orthopedics;  Laterality: Right;  Past Medical History:  Diagnosis Date  . Anxiety   . Clotting disorder ([place])   . Depression   . Dyspnea   . GERD (gastroesophageal reflux disease)   . Headache   . Lumbosacral spondylosis without myelopathy   . Neuropathy   . Postlaminectomy syndrome, lumbar region   . Postlaminectomy syndrome, thoracic region   . Stroke ([place])    BP 126/79   Pulse 76   Ht 5\' 6"  (1.676 m)   Wt 205 lb (93 kg)   SpO2 98%   BMI 33.09 kg/m   Opioid Risk Score:   Fall Risk Score:  `1  Depression screen PHQ 2/9  Depression screen [facility] 2/9 07/07/2017 04/11/2017 01/03/2017 06/05/2015 05/08/2015 11/10/2014  Decreased Interest 0 0 0 2 2 0  Down, Depressed, Hopeless - 0 0 1 1 0  PHQ - 2 Score 0 0 0 3 3 0  Altered sleeping - - - - 2 -  Tired, decreased energy - - - - 2 -  Change in appetite - - - - 2 -  Feeling bad or failure about  yourself  - - - - 2 -  Trouble concentrating - - - - 2 -  Moving slowly or fidgety/restless - - - - 1 -  Suicidal thoughts - - - - 0 -  PHQ-9 Score - - - - 14 -  Difficult doing work/chores - - - - Somewhat difficult -  Some recent data might be hidden     Review of Systems  Constitutional: Negative.   HENT: Negative.   Eyes: Negative.   Respiratory: Negative.   Cardiovascular: Negative.   Gastrointestinal: Negative.   Endocrine: Negative.   Genitourinary: Negative.   Musculoskeletal: Positive for arthralgias, back pain, gait problem and myalgias.  Skin: Negative.   Allergic/Immunologic: Negative.   Neurological: Positive for numbness.  Hematological: Negative.   Psychiatric/Behavioral: Negative.   All other systems reviewed and are negative.      Objective:   Physical Exam  Constitutional: She is oriented to person, place, and time. She appears well-developed and well-nourished.  HENT:  Visually impaired able to read quarter-inch print or larger   Eyes: Right eye exhibits no discharge. Left eye exhibits no discharge. No scleral icterus.  Musculoskeletal:  Lumbar range of motion 50% flexion 25% extension 50% lateral bending 25% rotation Negative straight leg raising Tenderness palpation along the left L4-L5 paraspinal muscles.  Limited right shoulder abduction to 90 degrees flexion to 10 degrees  Neurological: She is alert and oriented to person, place, and time.  Skin: Skin is warm and dry.  Psychiatric: She has a normal mood and affect.  Nursing note and vitals reviewed.  Lower extremity strength is normal bilateral hip flexors knee extensors ankle dorsiflexors Positive impingement right shoulder at 90 degrees      Assessment & Plan:  1.  Lumbar spondylosis without myelopathy she gets good relief with radiofrequency neurotomy the left side is wearing off after greater than 2 years of relief.  We will schedule for repeat after the holidays per her request. 2.   Right shoulder rotator cuff syndrome, she follows with her orthopedic surgeon Dr. Novella Olive, has been advised to have surgery which the patient does not wish to consider at the current time.

## 2018-01-11 DIAGNOSIS — Z952 Presence of prosthetic heart valve: Secondary | ICD-10-CM | POA: Diagnosis not present

## 2018-01-11 DIAGNOSIS — Z7901 Long term (current) use of anticoagulants: Secondary | ICD-10-CM | POA: Diagnosis not present

## 2018-01-12 DIAGNOSIS — F315 Bipolar disorder, current episode depressed, severe, with psychotic features: Secondary | ICD-10-CM | POA: Diagnosis not present

## 2018-01-14 LAB — 6-ACETYLMORPHINE,TOXASSURE ADD
6-ACETYLMORPHINE: NEGATIVE
6-acetylmorphine: NOT DETECTED ng/mg creat

## 2018-01-14 LAB — TOXASSURE SELECT,+ANTIDEPR,UR

## 2018-01-15 ENCOUNTER — Telehealth: Payer: Self-pay | Admitting: *Deleted

## 2018-01-15 NOTE — Telephone Encounter (Signed)
Urine drug screen for this encounter is consistent for prescribed medication 

## 2018-02-11 DIAGNOSIS — Z7901 Long term (current) use of anticoagulants: Secondary | ICD-10-CM | POA: Diagnosis not present

## 2018-02-11 DIAGNOSIS — Z952 Presence of prosthetic heart valve: Secondary | ICD-10-CM | POA: Diagnosis not present

## 2018-02-25 ENCOUNTER — Emergency Department (HOSPITAL_COMMUNITY)
Admission: EM | Admit: 2018-02-25 | Discharge: 2018-02-25 | Disposition: A | Discharge status: Home / Self Care | Payer: Medicare Other | Attending: Emergency Medicine | Admitting: Emergency Medicine

## 2018-02-25 ENCOUNTER — Encounter (HOSPITAL_COMMUNITY): Payer: Self-pay | Admitting: Emergency Medicine

## 2018-02-25 ENCOUNTER — Emergency Department (HOSPITAL_COMMUNITY): Payer: Medicare Other

## 2018-02-25 ENCOUNTER — Other Ambulatory Visit: Payer: Self-pay

## 2018-02-25 DIAGNOSIS — Z7901 Long term (current) use of anticoagulants: Secondary | ICD-10-CM | POA: Insufficient documentation

## 2018-02-25 DIAGNOSIS — Z79899 Other long term (current) drug therapy: Secondary | ICD-10-CM | POA: Diagnosis not present

## 2018-02-25 DIAGNOSIS — M542 Cervicalgia: Secondary | ICD-10-CM | POA: Diagnosis not present

## 2018-02-25 DIAGNOSIS — M5412 Radiculopathy, cervical region: Secondary | ICD-10-CM | POA: Insufficient documentation

## 2018-02-25 DIAGNOSIS — R0789 Other chest pain: Secondary | ICD-10-CM | POA: Diagnosis not present

## 2018-02-25 DIAGNOSIS — R200 Anesthesia of skin: Secondary | ICD-10-CM | POA: Insufficient documentation

## 2018-02-25 DIAGNOSIS — Z96651 Presence of right artificial knee joint: Secondary | ICD-10-CM | POA: Insufficient documentation

## 2018-02-25 LAB — CBC WITH DIFFERENTIAL/PLATELET
Abs Immature Granulocytes: 0.01 10*3/uL (ref 0.00–0.07)
BASOS ABS: 0.1 10*3/uL (ref 0.0–0.1)
BASOS PCT: 1 %
Eosinophils Absolute: 0.2 10*3/uL (ref 0.0–0.5)
Eosinophils Relative: 2 %
HCT: 37 % (ref 36.0–46.0)
HEMOGLOBIN: 11.6 g/dL — AB (ref 12.0–15.0)
Immature Granulocytes: 0 %
LYMPHS PCT: 33 %
Lymphs Abs: 2.6 10*3/uL (ref 0.7–4.0)
MCH: 31.7 pg (ref 26.0–34.0)
MCHC: 31.4 g/dL (ref 30.0–36.0)
MCV: 101.1 fL — ABNORMAL HIGH (ref 80.0–100.0)
Monocytes Absolute: 0.6 10*3/uL (ref 0.1–1.0)
Monocytes Relative: 8 %
NEUTROS ABS: 4.5 10*3/uL (ref 1.7–7.7)
NRBC: 0 % (ref 0.0–0.2)
Neutrophils Relative %: 56 %
PLATELETS: 213 10*3/uL (ref 150–400)
RBC: 3.66 MIL/uL — AB (ref 3.87–5.11)
RDW: 12.5 % (ref 11.5–15.5)
WBC: 7.9 10*3/uL (ref 4.0–10.5)

## 2018-02-25 LAB — COMPREHENSIVE METABOLIC PANEL
ALBUMIN: 3.4 g/dL — AB (ref 3.5–5.0)
ALK PHOS: 68 U/L (ref 38–126)
ALT: 15 U/L (ref 0–44)
AST: 20 U/L (ref 15–41)
Anion gap: 7 (ref 5–15)
BUN: 14 mg/dL (ref 8–23)
CHLORIDE: 111 mmol/L (ref 98–111)
CO2: 22 mmol/L (ref 22–32)
Calcium: 8.8 mg/dL — ABNORMAL LOW (ref 8.9–10.3)
Creatinine, Ser: 0.86 mg/dL (ref 0.44–1.00)
GFR calc Af Amer: >60 mL/min (ref >60)
GFR calc non Af Amer: >60 mL/min (ref >60)
GLUCOSE: 126 mg/dL — AB (ref 70–99)
POTASSIUM: 3.3 mmol/L — AB (ref 3.5–5.1)
SODIUM: 140 mmol/L (ref 135–145)
Total Bilirubin: 0.2 mg/dL — ABNORMAL LOW (ref 0.3–1.2)
Total Protein: 7.2 g/dL (ref 6.5–8.1)

## 2018-02-25 LAB — TROPONIN I: Troponin I: <0.03 ng/mL (ref <0.03)

## 2018-02-25 LAB — I-STAT TROPONIN, ED: Troponin i, poc: 0 ng/mL (ref 0.00–0.08)

## 2018-02-25 LAB — PROTIME-INR
INR: 2.34
Prothrombin Time: 25.3 seconds — ABNORMAL HIGH (ref 11.4–15.2)

## 2018-02-25 MED ORDER — KETOROLAC TROMETHAMINE 30 MG/ML IJ SOLN
30.0000 mg | Freq: Once | INTRAMUSCULAR | Status: AC
  Administered 2018-02-25: 30 mg via INTRAVENOUS
  Filled 2018-02-25: qty 1

## 2018-02-25 MED ORDER — MORPHINE SULFATE (PF) 4 MG/ML IV SOLN: 4.0000 mg | Freq: Once | INTRAVENOUS | Status: DC

## 2018-02-25 MED ORDER — METHYLPREDNISOLONE SODIUM SUCC 125 MG IJ SOLR
125.0000 mg | Freq: Once | INTRAMUSCULAR | Status: AC
  Administered 2018-02-25: 125 mg via INTRAVENOUS
  Filled 2018-02-25: qty 2

## 2018-02-25 MED ORDER — ACETAMINOPHEN-CODEINE #3 300-30 MG PO TABS
1.0000 | ORAL_TABLET | Freq: Once | ORAL | Status: AC
  Administered 2018-02-25: 1 via ORAL
  Filled 2018-02-25: qty 1

## 2018-02-25 MED ORDER — CYCLOBENZAPRINE HCL 10 MG PO TABS
5.0000 mg | ORAL_TABLET | Freq: Once | ORAL | Status: AC
  Administered 2018-02-25: 5 mg via ORAL
  Filled 2018-02-25: qty 1

## 2018-02-25 MED ORDER — PREDNISONE 20 MG PO TABS: ORAL_TABLET | ORAL | 0 refills | Status: DC

## 2018-02-25 NOTE — ED Provider Notes (Signed)
[facility] Provider Note   CSN: [id number] Arrival date & time: 02/25/18  1309     History   Chief Complaint Chief Complaint  Patient presents with  . Chest Pain  . L shoulder/arm pain    HPI Emily Peck is a 62 y.o. female history of reflux, chronic back pain status post radio frequency neurotomy here presenting with left arm pain, neck pain.  Patient states that she has been having left arm pain and neck pain for the last 2 to 3 days.  Around noontime, she noticed that the pain radiates up to the left side of her neck and into her chest.  She has some subjective numbness to her left arm as well.  She states that she had a stroke about 10 years ago and is compliant with her Coumadin currently.  She also had radiofrequency neurotomy which prevents her from getting MRI.  She also has a pain contract with a pain doctor and is currently on Tylenol No. 4 from her pain management doctor.   The history is provided by the patient.    Past Medical History:  Diagnosis Date  . Anxiety   . Clotting disorder ([place])   . Depression   . Dyspnea   . GERD (gastroesophageal reflux disease)   . Headache   . Lumbosacral spondylosis without myelopathy   . Neuropathy   . Postlaminectomy syndrome, lumbar region   . Postlaminectomy syndrome, thoracic region   . Stroke [facility])     Patient Active Problem List   Diagnosis Date Noted  . Disorder of rotator cuff syndrome of right shoulder and allied disorder 01/09/2018  . Primary osteoarthritis of right knee 01/09/2016  . Peripheral polyneuropathy 10/19/2015  . Knee pain, chronic 06/11/2013  . Thoracic postlaminectomy syndrome 06/11/2013  . Pes planus of both feet 06/11/2013  . Trochanteric bursitis of both hips 11/06/2012  . Lumbosacral spondylosis without myelopathy 05/09/2011  . DEPRESSION 07/24/2006  . CARPAL TUNNEL SYNDROME, RIGHT 07/24/2006  . MYOPIA, 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  . DEGENERATIVE  JOINT DISEASE 07/24/2006  . DISORDER, LUMBAR DISC W/MYELOPATHY 07/24/2006  . 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 AVULSION, EYE 12/23/2005    Past Surgical History:  Procedure Laterality Date  . ABDOMINAL HYSTERECTOMY    . BACK SURGERY    . EYE SURGERY     eye removed right   . KNEE ARTHROSCOPY    . 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   Procedure: TOTAL KNEE ARTHROPLASTY;  Surgeon: Melrose Nakayama, MD;  Location: [place];  Service: Orthopedics;  Laterality: Right;     OB History   No obstetric history on file.      Home Medications    Prior to Admission medications   Medication Sig Start Date End Date Taking? Authorizing Provider  acetaminophen-codeine (TYLENOL #4) 300-60 MG tablet Take 1 tablet by mouth 4 (four) times daily as needed for moderate pain. 01/09/18   Kirsteins, Luanna Salk, MD  baclofen (LIORESAL) 10 MG tablet Take 0.5 tablets (5 mg total) by mouth at bedtime as needed for muscle spasms. 04/11/17   Bayard Hugger, NP  Camphor-Eucalyptus-Menthol (VICKS VAPORUB EX) Apply topically. Uses on feet prn for pain  ([employer] brand)    [provider]  escitalopram (LEXAPRO) 20 MG tablet Take 20 mg by mouth at bedtime.  12/13/13   [provider]  gabapentin (NEURONTIN) 600 MG tablet Take 2 tablets (1,200 mg total) by mouth 3 (three) times daily. 10/15/17  Dennie Bible, NP  methocarbamol (ROBAXIN) 500 MG tablet Take 1 tablet (500 mg total) by mouth every 6 (six) hours as needed for muscle spasms. 01/11/16   Loni Dolly, PA-C  topiramate (TOPAMAX) 200 MG tablet Take 200 mg by mouth at bedtime.  07/13/14   [provider]  traZODone (DESYREL) 150 MG tablet Take 150 mg by mouth at bedtime. 07/13/14   [provider]  warfarin (COUMADIN) 6 MG tablet Take 6-9 mg by mouth daily. Take 9mg  Monday, Wednesday, and Fridays, then all other days take 6mg s daily    [provider]    Family History Family History  Problem  Relation Age of Onset  . Cancer Mother   . Diabetes Mother     Social History Social History   Tobacco Use  . Smoking status: Never Smoker  . Smokeless tobacco: Never Used  Substance Use Topics  . Alcohol use: No  . Drug use: No     Allergies   No known allergies   Review of Systems Review of Systems  Cardiovascular: Positive for chest pain.  Musculoskeletal: Positive for neck pain.  All other systems reviewed and are negative.    Physical Exam Updated Vital Signs BP 111/69   Pulse 82   Temp 97.6 F (36.4 C) (Oral)   Wt 95.3 kg   SpO2 100%   BMI 33.89 kg/m   Physical Exam Vitals signs and nursing note reviewed.  Constitutional:      Appearance: She is well-developed.  HENT:     Head: Normocephalic.  Eyes:     Extraocular Movements: Extraocular movements intact.     Pupils: Pupils are equal, round, and reactive to light.  Neck:     Musculoskeletal: Normal range of motion and neck supple.     Comments: Mild L paracervical tenderness  Cardiovascular:     Rate and Rhythm: Normal rate and regular rhythm.     Heart sounds: Normal heart sounds.  Pulmonary:     Effort: Pulmonary effort is normal.  Abdominal:     General: Bowel sounds are normal.     Palpations: Abdomen is soft.  Musculoskeletal: Normal range of motion.     Right lower leg: She exhibits no tenderness. No edema.     Left lower leg: She exhibits no tenderness. No edema.  Skin:    General: Skin is warm.  Neurological:     Mental Status: She is alert.     Comments: CN 2- 12 intact. Slightly dec L hand grasp due to pain. Nl L biceps flexion and triceps extension. Slightly dec sensation L thumb area. Nl reflexes throughout   Psychiatric:        Mood and Affect: Mood normal.        Behavior: Behavior normal.      ED Treatments / Results  Labs (all labs ordered are listed, but only abnormal results are displayed) Labs Reviewed  CBC WITH DIFFERENTIAL/PLATELET - Abnormal; Notable for the  following components:      Result Value   RBC 3.66 (*)    Hemoglobin 11.6 (*)    MCV 101.1 (*)    All other components within normal limits  COMPREHENSIVE METABOLIC PANEL - Abnormal; Notable for the following components:   Potassium 3.3 (*)    Glucose, Bld 126 (*)    Calcium 8.8 (*)    Albumin 3.4 (*)    Total Bilirubin 0.2 (*)    All other components within normal limits  PROTIME-INR  I-STAT TROPONIN, ED    EKG EKG Interpretation  Date/Time:  Wednesday February 25 2018 13:23:06 EST Ventricular Rate:  75 PR Interval:    QRS Duration: 89 QT Interval:  421 QTC Calculation: 471 R Axis:   15 Text Interpretation:  Sinus rhythm Low voltage, precordial leads Baseline wander in lead(s) II No significant change since last tracing Confirmed by Wandra Arthurs [phone]) on 02/25/2018 1:38:09 PM   Radiology Dg Chest 2 View  Result Date: 02/25/2018 CLINICAL DATA:  LEFT-sided chest pain. EXAM: CHEST - 2 VIEW COMPARISON:  None. FINDINGS: Low lung volumes. Mild central venous congestion. Normal cardiac silhouette. Posterior thoracolumbar fusion. No effusion. IMPRESSION: Mild central venous congestion.  No infiltrate. Electronically Signed   By: Suzy Bouchard M.D.   On: 02/25/2018 14:02   Dg Cervical Spine Complete  Result Date: 02/25/2018 CLINICAL DATA:  Left-sided neck pain and possible stroke. No injury. EXAM: CERVICAL SPINE - COMPLETE 4+ VIEW COMPARISON:  None. FINDINGS: Vertebral body alignment, heights and disc space heights are within normal. There is mild spondylosis throughout the cervical spine to include mild facet arthropathy and uncovertebral joint spurring. No significant neural foraminal narrowing. Atlantoaxial articulation is normal. IMPRESSION: No acute findings. Mild spondylosis of the cervical spine. Electronically Signed   By: Marin Olp M.D.   On: 02/25/2018 14:03    Procedures Procedures (including critical care time)  Medications Ordered in ED Medications    ketorolac (TORADOL) 30 MG/ML injection 30 mg (30 mg Intravenous Given 02/25/18 1437)  cyclobenzaprine (FLEXERIL) tablet 5 mg (5 mg Oral Given 02/25/18 1436)  methylPREDNISolone sodium succinate (SOLU-MEDROL) 125 mg/2 mL injection 125 mg (125 mg Intravenous Given 02/25/18 1437)     Initial Impression / Assessment and Plan / ED Course  I have reviewed the triage vital signs and the nursing notes.  Pertinent labs & imaging results that were available during my care of the patient were reviewed by me and considered in my medical decision making (see chart for details).    Emily Peck is a 62 y.o. female here with L neck pain, numbness, chest pain. Likely neck radiculopathy. She has chronic low back pain and I think likely has underlying arthritis of the neck. She is under pain management. She has no known CAD. I doubt PE or dissection. I doubt stroke and patient is on coumadin. She can't get MRI due to radiofrequency neurotomy. Will get CT head/neck, CXR, trop x 2. Will give muscle relaxants, toradol, steroids.   3:46 PM CT head showed microvascular disease but no obvious infarct. 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 I think likely radiculopathy. Patient has spine doctor and pain specialist. Told her that I can't prescribe pain meds for her and that she will need to talk to her doctor if she needs more pain meds. Will give course of steroids. Delta trop at 4:30 pm. If negative, anticipate dc home with prednisone. Signed out to Dr. Wilson Singer in the ED.    Final Clinical Impressions(s) / ED Diagnoses   Final diagnoses:  None    ED Discharge Orders    None       Drenda Freeze, MD 02/25/18 1547

## 2018-02-25 NOTE — Discharge Instructions (Addendum)
Take prednisone as prescribed.   Continue taking your pain meds as prescribed by your pain doctor.   Follow up with your back doctor  See cardiology for follow up   Return to ER if you have worse neck pain, chest pain, shortness of breath, worse numbness, weakness

## 2018-02-25 NOTE — ED Triage Notes (Signed)
Pt in from home with c/o L neck pain and sharp cp since 1200. States the pain is constant, has numbness and tingling in L arm. Denies n/v or sob

## 2018-03-11 DIAGNOSIS — M25512 Pain in left shoulder: Secondary | ICD-10-CM | POA: Diagnosis not present

## 2018-03-13 DIAGNOSIS — Z952 Presence of prosthetic heart valve: Secondary | ICD-10-CM | POA: Diagnosis not present

## 2018-03-13 DIAGNOSIS — Z7901 Long term (current) use of anticoagulants: Secondary | ICD-10-CM | POA: Diagnosis not present

## 2018-03-19 ENCOUNTER — Telehealth: Payer: Self-pay | Admitting: Physical Medicine & Rehabilitation

## 2018-03-19 ENCOUNTER — Encounter: Payer: Self-pay | Admitting: Physical Medicine & Rehabilitation

## 2018-03-19 ENCOUNTER — Encounter: Payer: Medicare Other | Attending: Physical Medicine & Rehabilitation

## 2018-03-19 ENCOUNTER — Ambulatory Visit (HOSPITAL_BASED_OUTPATIENT_CLINIC_OR_DEPARTMENT_OTHER): Payer: Medicare Other | Admitting: Physical Medicine & Rehabilitation

## 2018-03-19 VITALS — BP 118/76 | HR 58 | Ht 66.0 in | Wt 208.0 lb

## 2018-03-19 DIAGNOSIS — Z5181 Encounter for therapeutic drug level monitoring: Secondary | ICD-10-CM | POA: Diagnosis not present

## 2018-03-19 DIAGNOSIS — H10411 Chronic giant papillary conjunctivitis, right eye: Secondary | ICD-10-CM | POA: Diagnosis not present

## 2018-03-19 DIAGNOSIS — M542 Cervicalgia: Secondary | ICD-10-CM | POA: Insufficient documentation

## 2018-03-19 DIAGNOSIS — M545 Low back pain: Secondary | ICD-10-CM | POA: Diagnosis not present

## 2018-03-19 DIAGNOSIS — Z79891 Long term (current) use of opiate analgesic: Secondary | ICD-10-CM | POA: Diagnosis not present

## 2018-03-19 DIAGNOSIS — G894 Chronic pain syndrome: Secondary | ICD-10-CM | POA: Diagnosis not present

## 2018-03-19 DIAGNOSIS — Z9001 Acquired absence of eye: Secondary | ICD-10-CM | POA: Diagnosis not present

## 2018-03-19 DIAGNOSIS — M47816 Spondylosis without myelopathy or radiculopathy, lumbar region: Secondary | ICD-10-CM | POA: Diagnosis not present

## 2018-03-19 DIAGNOSIS — M47817 Spondylosis without myelopathy or radiculopathy, lumbosacral region: Secondary | ICD-10-CM | POA: Diagnosis not present

## 2018-03-19 DIAGNOSIS — Z970 Presence of artificial eye: Secondary | ICD-10-CM | POA: Diagnosis not present

## 2018-03-19 MED ORDER — METHOCARBAMOL 500 MG PO TABS: 500.0000 mg | ORAL_TABLET | Freq: Three times a day (TID) | ORAL | 0 refills | Status: DC

## 2018-03-19 MED ORDER — ACETAMINOPHEN-CODEINE #4 300-60 MG PO TABS: 1.0000 | ORAL_TABLET | Freq: Four times a day (QID) | ORAL | 2 refills | Status: DC | PRN

## 2018-03-19 NOTE — Patient Instructions (Signed)
Neck Exercises  Neck exercises can be important for many reasons:   They can help you to improve and maintain flexibility in your neck. This can be especially important as you age.   They can help to make your neck stronger. This can make movement easier.   They can reduce or prevent neck pain.   They may help your upper back.  Ask your health care provider which neck exercises would be best for you.  Exercises to improve neck flexibility  Neck stretch  Repeat this exercise 3-5 times.  1. Do this exercise while standing or while sitting in a chair.  2. Place your feet flat on the floor, shoulder-width apart.  3. Slowly turn your head to the right. Turn it all the way to the right so you can look over your right shoulder. Do not tilt or tip your head.  4. Hold this position for 10-30 seconds.  5. Slowly turn your head to the left, to look over your left shoulder.  6. Hold this position for 10-30 seconds.    Neck retraction  Repeat this exercise 8-10 times. Do this 3-4 times a day or as told by your health care provider.  1. Do this exercise while standing or while sitting in a sturdy chair.  2. Look straight ahead. Do not bend your neck.  3. Use your fingers to push your chin backward. Do not bend your neck for this movement. Continue to face straight ahead. If you are doing the exercise properly, you will feel a slight sensation in your throat and a stretch at the back of your neck.  4. Hold the stretch for 1-2 seconds. Relax and repeat.  Exercises to improve neck strength  Neck press  Repeat this exercise 10 times. Do it first thing in the morning and right before bed or as told by your health care provider.  1. Lie on your back on a firm bed or on the floor with a pillow under your head.  2. Use your neck muscles to push your head down on the pillow and straighten your spine.  3. Hold the position as well as you can. Keep your head facing up and your chin tucked.  4. Slowly count to 5 while holding this  position.  5. Relax for a few seconds. Then repeat.  Isometric strengthening  Do a full set of these exercises 2 times a day or as told by your health care provider.  1. Sit in a supportive chair and place your hand on your forehead.  2. Push forward with your head and neck while pushing back with your hand. Hold for 10 seconds.  3. Relax. Then repeat the exercise 3 times.  4. Next, do thesequence again, this time putting your hand against the back of your head. Use your head and neck to push backward against the hand pressure.  5. Finally, do the same exercise on either side of your head, pushing sideways against the pressure of your hand.  Prone head lifts  Repeat this exercise 5 times. Do this 2 times a day or as told by your health care provider.  1. Lie face-down, resting on your elbows so that your chest and upper back are raised.  2. Start with your head facing downward, near your chest. Position your chin either on or near your chest.  3. Slowly lift your head upward. Lift until you are looking straight ahead. Then continue lifting your head as far back as you   can stretch.  4. Hold your head up for 5 seconds. Then slowly lower it to your starting position.  Supine head lifts  Repeat this exercise 8-10 times. Do this 2 times a day or as told by your health care provider.  1. Lie on your back, bending your knees to point to the ceiling and keeping your feet flat on the floor.  2. Lift your head slowly off the floor, raising your chin toward your chest.  3. Hold for 5 seconds.  4. Relax and repeat.  Scapular retraction  Repeat this exercise 5 times. Do this 2 times a day or as told by your health care provider.  1. Stand with your arms at your sides. Look straight ahead.  2. Slowly pull both shoulders backward and downward until you feel a stretch between your shoulder blades in your upper back.  3. Hold for 10-30 seconds.  4. Relax and repeat.  Contact a health care provider if:   Your neck pain or  discomfort gets much worse when you do an exercise.   Your neck pain or discomfort does not improve within 2 hours after you exercise.  If you have any of these problems, stop exercising right away. Do not do the exercises again unless your health care provider says that you can.  Get help right away if:   You develop sudden, severe neck pain. If this happens, stop exercising right away. Do not do the exercises again unless your health care provider says that you can.  This information is not intended to replace advice given to you by your health care provider. Make sure you discuss any questions you have with your health care provider.  Document Released: 01/30/2015 Document Revised: 06/24/2017 Document Reviewed: 08/29/2014  Elsevier Interactive Patient Education  2019 [employer]
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
## 2018-03-19 NOTE — Telephone Encounter (Signed)
Patient saw Dr. Letta Pate today and she forgot him to do a work note for her.  Can one be mailed to her?  Any questions please call patient.

## 2018-03-19 NOTE — Telephone Encounter (Signed)
This work note can be done at the front desk basically it is to excuse her because of the visit.  She has no need for any new work restrictions etc.

## 2018-03-19 NOTE — Telephone Encounter (Signed)
Note written. I notified Ms Burgen and she requested we put it in the mail to her.

## 2018-03-23 DIAGNOSIS — R600 Localized edema: Secondary | ICD-10-CM | POA: Diagnosis not present

## 2018-03-23 DIAGNOSIS — R0602 Shortness of breath: Secondary | ICD-10-CM | POA: Diagnosis not present

## 2018-03-23 DIAGNOSIS — I080 Rheumatic disorders of both mitral and aortic valves: Secondary | ICD-10-CM | POA: Diagnosis not present

## 2018-03-23 DIAGNOSIS — R0789 Other chest pain: Secondary | ICD-10-CM | POA: Diagnosis not present

## 2018-03-24 ENCOUNTER — Other Ambulatory Visit: Payer: Self-pay | Admitting: Cardiology

## 2018-03-24 DIAGNOSIS — R0789 Other chest pain: Secondary | ICD-10-CM

## 2018-03-24 DIAGNOSIS — R0602 Shortness of breath: Secondary | ICD-10-CM

## 2018-03-25 DIAGNOSIS — Z7901 Long term (current) use of anticoagulants: Secondary | ICD-10-CM | POA: Diagnosis not present

## 2018-03-25 DIAGNOSIS — E559 Vitamin D deficiency, unspecified: Secondary | ICD-10-CM | POA: Diagnosis not present

## 2018-03-25 DIAGNOSIS — E782 Mixed hyperlipidemia: Secondary | ICD-10-CM | POA: Diagnosis not present

## 2018-03-25 DIAGNOSIS — R5383 Other fatigue: Secondary | ICD-10-CM | POA: Diagnosis not present

## 2018-03-25 DIAGNOSIS — Z79899 Other long term (current) drug therapy: Secondary | ICD-10-CM | POA: Diagnosis not present

## 2018-03-25 DIAGNOSIS — I8001 Phlebitis and thrombophlebitis of superficial vessels of right lower extremity: Secondary | ICD-10-CM | POA: Diagnosis not present

## 2018-03-26 DIAGNOSIS — Z7901 Long term (current) use of anticoagulants: Secondary | ICD-10-CM | POA: Diagnosis not present

## 2018-03-26 DIAGNOSIS — E782 Mixed hyperlipidemia: Secondary | ICD-10-CM | POA: Diagnosis not present

## 2018-03-26 DIAGNOSIS — R5383 Other fatigue: Secondary | ICD-10-CM | POA: Diagnosis not present

## 2018-03-26 DIAGNOSIS — E559 Vitamin D deficiency, unspecified: Secondary | ICD-10-CM | POA: Diagnosis not present

## 2018-03-26 DIAGNOSIS — Z79899 Other long term (current) drug therapy: Secondary | ICD-10-CM | POA: Diagnosis not present

## 2018-03-26 DIAGNOSIS — I8001 Phlebitis and thrombophlebitis of superficial vessels of right lower extremity: Secondary | ICD-10-CM | POA: Diagnosis not present

## 2018-03-27 ENCOUNTER — Ambulatory Visit: Payer: Medicare Other | Admitting: Physical Medicine & Rehabilitation

## 2018-04-02 DIAGNOSIS — R079 Chest pain, unspecified: Secondary | ICD-10-CM | POA: Diagnosis not present

## 2018-04-02 DIAGNOSIS — R0602 Shortness of breath: Secondary | ICD-10-CM | POA: Diagnosis not present

## 2018-04-02 DIAGNOSIS — F315 Bipolar disorder, current episode depressed, severe, with psychotic features: Secondary | ICD-10-CM | POA: Diagnosis not present

## 2018-04-13 DIAGNOSIS — Z7901 Long term (current) use of anticoagulants: Secondary | ICD-10-CM | POA: Diagnosis not present

## 2018-04-13 DIAGNOSIS — Z952 Presence of prosthetic heart valve: Secondary | ICD-10-CM | POA: Diagnosis not present

## 2018-04-17 ENCOUNTER — Encounter: Payer: Medicare Other | Attending: Physical Medicine & Rehabilitation

## 2018-04-17 ENCOUNTER — Ambulatory Visit (HOSPITAL_BASED_OUTPATIENT_CLINIC_OR_DEPARTMENT_OTHER): Payer: Medicare Other | Admitting: Physical Medicine & Rehabilitation

## 2018-04-17 ENCOUNTER — Encounter: Payer: Self-pay | Admitting: Physical Medicine & Rehabilitation

## 2018-04-17 DIAGNOSIS — M47816 Spondylosis without myelopathy or radiculopathy, lumbar region: Secondary | ICD-10-CM | POA: Insufficient documentation

## 2018-04-17 DIAGNOSIS — M545 Low back pain: Secondary | ICD-10-CM | POA: Diagnosis not present

## 2018-04-17 DIAGNOSIS — M47817 Spondylosis without myelopathy or radiculopathy, lumbosacral region: Secondary | ICD-10-CM | POA: Diagnosis not present

## 2018-04-17 NOTE — Progress Notes (Signed)
  PROCEDURE RECORD [facility]   Name: ARIEANNA PRESSEY DOB:[birth year] MRN: [id number]  Date:04/17/2018  Physician: Alysia Penna, MD    Nurse/CMA: Kensi Karr, CMA  Allergies:  Allergies  Allergen Reactions  . No Known Allergies     Consent Signed: Yes.    Is patient diabetic? No.  CBG today?   Pregnant: No. LMP: No LMP recorded. Patient has had a hysterectomy. (age 65-53)  Anticoagulants: yes (warfarin, IR = 1.8) Anti-inflammatory: no Antibiotics: no  * Procedure: left L3,4.5 radiofrequency  Position: Prone Start Time: 2:22pm  End Time:2:38pm  Fluoro Time: 40s  RN/CMA Jordana Dugue, CMA Eliaz Fout, CMA    Time 2:05pm 2:46pm    BP 127/79 161/84    Pulse 64 66    Respirations 14 14    O2 Sat 97 97    S/S 6 6    Pain Level 5/10 0/10     D/C home with medical transport, patient A & O X 3, D/C instructions reviewed, and sits independently.

## 2018-04-17 NOTE — Progress Notes (Signed)
Left L5 dorsal ramus., left L4 and left L3 medial branch radio frequency neurotomy under fluoroscopic guidance ° °Indication: Low back pain due to lumbar spondylosis which has been relieved on 2 occasions by greater than 50% by lumbar medial branch blocks at corresponding levels. ° °Informed consent was obtained after describing risks and benefits of the procedure with the patient, this includes bleeding, bruising, infection, paralysis and medication side effects. The patient wishes to proceed and has given written consent. The patient was placed in a prone position. The lumbar and sacral area was marked and prepped with Betadine. A 25-gauge 1-1/2 inch needle was inserted into the skin and subcutaneous tissue at 3 sites in one ML of 1% lidocaine was injected into each site. Then a 18-gauge 10 cm radio frequency needle (would consider 15cm for subsequent, since needle buried to hub) with a 1 cm curved active tip was inserted targeting the left S1 SAP/sacral ala junction. Bone contact was made and confirmed with lateral imaging.  motor stimulation at 2 Hz confirm proper needle location followed by injection of one ML of 1% MPF lidocaine. Then the left L5 SAP/transverse process junction was targeted. Bone contact was made and confirmed with lateral imaging motor stimulation at 2 Hz confirm proper needle location followed by injection of one ML of the solution containing one ML of  1% MPF lidocaine. Then the left L4 SAP/transverse process junction was targeted. Bone contact was made and confirmed with lateral imaging. motor stimulation at 2 Hz confirm proper needle location followed by injection of one ML of the solution containing one ML of1% MPF lidocaine. Radio frequency lesion  at 80°C for 90 seconds was performed. Needles were removed. Post procedure instructions and vital signs were performed. Patient tolerated procedure well. Followup appointment was given. ° °

## 2018-04-20 ENCOUNTER — Other Ambulatory Visit: Payer: Self-pay

## 2018-04-22 ENCOUNTER — Telehealth: Payer: Self-pay | Admitting: *Deleted

## 2018-04-22 NOTE — Telephone Encounter (Signed)
I tell pt that after RF they may be sore for up to 4wks

## 2018-04-22 NOTE — Telephone Encounter (Signed)
Patient left a message stating that she had a procedure Friday the 14th with Dr. Letta Pate.  She states in her message that she is still hurting in the region the injection was performed. She is asking if this is normal (5 days later).

## 2018-04-23 NOTE — Telephone Encounter (Signed)
Notified patient, she will call back if it persists

## 2018-05-04 ENCOUNTER — Other Ambulatory Visit: Payer: Self-pay

## 2018-05-04 ENCOUNTER — Telehealth: Payer: Self-pay

## 2018-05-04 DIAGNOSIS — R0789 Other chest pain: Secondary | ICD-10-CM

## 2018-05-04 NOTE — Telephone Encounter (Signed)
05/04/18 left vm for patient to call back as she missed her 8am 05/03/20 Nuc Stress test. She called back and stated she had two other appointment today, and meant to call us to cx this appointment. She also mentioned she wasn't able to get off work today.

## 2018-05-12 DIAGNOSIS — Z952 Presence of prosthetic heart valve: Secondary | ICD-10-CM | POA: Diagnosis not present

## 2018-05-12 DIAGNOSIS — Z7901 Long term (current) use of anticoagulants: Secondary | ICD-10-CM | POA: Diagnosis not present

## 2018-05-26 ENCOUNTER — Ambulatory Visit: Payer: Self-pay | Admitting: Cardiology

## 2018-06-08 DIAGNOSIS — Z7901 Long term (current) use of anticoagulants: Secondary | ICD-10-CM | POA: Diagnosis not present

## 2018-06-08 DIAGNOSIS — Z952 Presence of prosthetic heart valve: Secondary | ICD-10-CM | POA: Diagnosis not present

## 2018-06-09 ENCOUNTER — Other Ambulatory Visit: Payer: Self-pay

## 2018-06-09 ENCOUNTER — Ambulatory Visit: Payer: Medicare Other | Admitting: Physical Medicine & Rehabilitation

## 2018-06-09 ENCOUNTER — Telehealth: Payer: Self-pay | Admitting: *Deleted

## 2018-06-09 ENCOUNTER — Encounter: Payer: Self-pay | Admitting: Physical Medicine & Rehabilitation

## 2018-06-09 ENCOUNTER — Encounter: Payer: Medicare Other | Attending: Physical Medicine & Rehabilitation

## 2018-06-09 ENCOUNTER — Ambulatory Visit (HOSPITAL_BASED_OUTPATIENT_CLINIC_OR_DEPARTMENT_OTHER): Payer: Medicare Other | Admitting: Physical Medicine & Rehabilitation

## 2018-06-09 VITALS — Ht 66.0 in | Wt 208.0 lb

## 2018-06-09 DIAGNOSIS — M47816 Spondylosis without myelopathy or radiculopathy, lumbar region: Secondary | ICD-10-CM | POA: Insufficient documentation

## 2018-06-09 DIAGNOSIS — M47817 Spondylosis without myelopathy or radiculopathy, lumbosacral region: Secondary | ICD-10-CM

## 2018-06-09 DIAGNOSIS — M75101 Unspecified rotator cuff tear or rupture of right shoulder, not specified as traumatic: Secondary | ICD-10-CM

## 2018-06-09 DIAGNOSIS — M75102 Unspecified rotator cuff tear or rupture of left shoulder, not specified as traumatic: Secondary | ICD-10-CM

## 2018-06-09 DIAGNOSIS — M545 Low back pain: Secondary | ICD-10-CM | POA: Insufficient documentation

## 2018-06-09 MED ORDER — ACETAMINOPHEN-CODEINE #4 300-60 MG PO TABS: 1.0000 | ORAL_TABLET | Freq: Four times a day (QID) | ORAL | 2 refills | Status: DC | PRN

## 2018-06-09 NOTE — Telephone Encounter (Signed)
I have spoke to Emily Peck about her scheduled RF 07/07/18.  It may be rescheduled due to the COVID 19, but she knows to stop her blood thinner 5 days before the procedure.  I have instructed her to call our [facility] around 07/01/18 to make sure we are proceeding with the procedure as scheduled, and she can begin to hold her medication if ok. She will need to get a PT/INR day before or day of procedure. She is familiar with this process.

## 2018-06-09 NOTE — Progress Notes (Signed)
Subjective:    Patient ID: Emily Peck, female    DOB: 1955/10/11, 63 y.o.   MRN: [id number]  HPI  63 year old female with severe visual impairment, legally blind who still works at the [employer].  She has a history of chronic low back pain and has been doing relatively well with left-sided low back pain since her radiofrequency neurotomy performed in February 2020  She complains that the right-sided low back pain has been increasing in severity.  Her last radiofrequency at right L3-L4 medial branch and right L5 dorsal ramus was performed in December 2018.  Pt states her shoulders are bothering her and has been told by orthopedic surgeon that she needs shoulder surgery.  She tried cortisone injections but was told that they can't do that forever.  Lumbar area hurts with prolonged standing, some pain in hip and groin are  as well, this is mainly RIght sided   VIRTUAL VISIT -- Pain Inventory treatment Average Pain 7 Pain Right Now 7 My pain is constant, sharp, dull and stabbing  Hurts in her arms and tingling in fingers  In the last 24 hours, has pain interfered with the following? General activity 5 Relation with others 5 Enjoyment of life 5 What TIME of day is your pain at its worst? night Sleep (in general) Poor  Pain is worse with: some activites Pain improves with: heat/ice and medication Relief from Meds: 2  Mobility walk with assistance use a cane  Function employed # of hrs/week 8  Neuro/Psych weakness tingling spasms depression  Prior Studies Any changes since last visit?  no  Physicians involved in your care Any changes since last visit?  no   Family History  Problem Relation Age of Onset  . Cancer Mother   . Diabetes Mother    Social History   Socioeconomic History  . Marital status: Single    Spouse name: Not on file  . Number of children: Not on file  . Years of education: Not on file  . Highest education level: Not on file   Occupational History  . Not on file  Social Needs  . Financial resource strain: Not on file  . Food insecurity:    Worry: Not on file    Inability: Not on file  . Transportation needs:    Medical: Not on file    Non-medical: Not on file  Tobacco Use  . Smoking status: Never Smoker  . Smokeless tobacco: Never Used  Substance and Sexual Activity  . Alcohol use: No  . Drug use: No  . Sexual activity: Not on file  Lifestyle  . Physical activity:    Days per week: Not on file    Minutes per session: Not on file  . Stress: Not on file  Relationships  . Social connections:    Talks on phone: Not on file    Gets together: Not on file    Attends religious service: Not on file    Active member of club or organization: Not on file    Attends meetings of clubs or organizations: Not on file    Relationship status: Not on file  Other Topics Concern  . Not on file  Social History Narrative  . Not on file   Past Surgical History:  Procedure Laterality Date  . ABDOMINAL HYSTERECTOMY    . BACK SURGERY    . EYE SURGERY     eye removed right   . KNEE ARTHROSCOPY    .  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   Procedure: TOTAL KNEE ARTHROPLASTY;  Surgeon: Melrose Nakayama, MD;  Location: [facility];  Service: Orthopedics;  Laterality: Right;   Past Medical History:  Diagnosis Date  . Anxiety   . Clotting disorder ([place])   . Depression   . Dyspnea   . GERD (gastroesophageal reflux disease)   . Headache   . Lumbosacral spondylosis without myelopathy   . Neuropathy   . Postlaminectomy syndrome, lumbar region   . Postlaminectomy syndrome, thoracic region   . Stroke ([place])    Ht 5\' 6"  (1.676 m)   Wt 208 lb (94.3 kg)   BMI 33.57 kg/m   Opioid Risk Score:   Fall Risk Score:  `1  Depression screen PHQ 2/9  Depression screen [facility] 2/9 06/09/2018 07/07/2017 04/11/2017 01/03/2017 06/05/2015 05/08/2015 11/10/2014  Decreased Interest 1 0 0 0 2 2 0  Down, Depressed, Hopeless 1 - 0 0 1 1 0  PHQ - 2  Score 2 0 0 0 3 3 0  Altered sleeping - - - - - 2 -  Tired, decreased energy - - - - - 2 -  Change in appetite - - - - - 2 -  Feeling bad or failure about yourself  - - - - - 2 -  Trouble concentrating - - - - - 2 -  Moving slowly or fidgety/restless - - - - - 1 -  Suicidal thoughts - - - - - 0 -  PHQ-9 Score - - - - - 14 -  Difficult doing work/chores - - - - - Somewhat difficult -  Some recent data might be hidden   Review of Systems  Constitutional: Negative.   HENT: Negative.   Eyes: Negative.   Respiratory: Negative.   Cardiovascular: Negative.   Gastrointestinal: Negative.   Endocrine: Negative.   Genitourinary: Negative.   Musculoskeletal:       Spasms in back   Skin: Negative.   Allergic/Immunologic: Negative.   Neurological: Positive for numbness.       Tingling in finger tips  Hematological: Negative.   Psychiatric/Behavioral: Positive for dysphoric mood.  All other systems reviewed and are negative.      Objective:   Physical Exam  Deferred secondary to phone visit      Assessment & Plan:  1.  Chronic low back pain lumbar spondylosis she gets good relief with radiofrequency neurotomy.  Her left side was done in April 17, 2018 the right side was done in December 2018.  We will repeat the right side urgently due to pain that is not responding to her medications.  She is also not a good candidate to increase her pain medications due to her visual impairment and fall risk.  Continue Tylenol with codeine 1 tablet 4 times per day 60 mg dose, continue Robaxin 500 mg 3 times daily as needed  2.  Bilateral shoulder pain with rotator cuff syndrome she has been advised to have surgery.  There are some other nonsurgical treatments.  In addition to corticosteroid injection we may consider suprascapular nerve block under ultrasound guidance or possible percutaneous electrical nerve stimulation

## 2018-07-06 DIAGNOSIS — Z7901 Long term (current) use of anticoagulants: Secondary | ICD-10-CM | POA: Diagnosis not present

## 2018-07-06 DIAGNOSIS — Z952 Presence of prosthetic heart valve: Secondary | ICD-10-CM | POA: Diagnosis not present

## 2018-07-07 ENCOUNTER — Encounter: Payer: Medicare Other | Attending: Physical Medicine & Rehabilitation | Admitting: Physical Medicine & Rehabilitation

## 2018-07-07 ENCOUNTER — Encounter: Payer: Self-pay | Admitting: Physical Medicine & Rehabilitation

## 2018-07-07 ENCOUNTER — Other Ambulatory Visit: Payer: Self-pay

## 2018-07-07 VITALS — BP 117/75 | HR 71 | Temp 97.8°F | Ht 66.0 in | Wt 205.0 lb

## 2018-07-07 DIAGNOSIS — M545 Low back pain: Secondary | ICD-10-CM | POA: Insufficient documentation

## 2018-07-07 DIAGNOSIS — M47817 Spondylosis without myelopathy or radiculopathy, lumbosacral region: Secondary | ICD-10-CM | POA: Diagnosis not present

## 2018-07-07 DIAGNOSIS — M47816 Spondylosis without myelopathy or radiculopathy, lumbar region: Secondary | ICD-10-CM | POA: Insufficient documentation

## 2018-07-07 NOTE — Progress Notes (Signed)
  PROCEDURE RECORD [facility]   Name: Emily Peck DOB:Jan 03, 1956 MRN: [id number]  Date:07/07/2018  Physician: Alysia Penna, MD    Nurse/CMA: Bright CMA  Allergies:  Allergies  Allergen Reactions  . No Known Allergies     Consent Signed: Yes.    Is patient diabetic? No.  CBG today? NA  Pregnant: No. LMP: No LMP recorded. Patient has had a hysterectomy. (age [age])  Anticoagulants: yes (coumadin last pm) Anti-inflammatory: yes (ibprofen 5 days ago) Antibiotics: no  Procedure: Right L3-5 Radiofrequency Neurotomy Position: Prone   Start Time: 235pm End Time: 254pm Fluoro Time: 48s  RN/CMA Bright CMA Bright CMA    Time 147pm 300pm    BP 117/75 142/90    Pulse 71 66    Respirations 16 16    O2 Sat 97 97    S/S 6 6    Pain Level 6/10 0/10     D/C home with home with transportation, patient A & O X 3, D/C instructions reviewed, and sits independently.

## 2018-07-07 NOTE — Patient Instructions (Signed)

## 2018-07-07 NOTE — Progress Notes (Signed)
RightL5 dorsal ramus., Right L4 and Right L3 medial branch radio frequency neurotomy under fluoroscopic guidance   Indication: Low back pain due to lumbar spondylosis which has been relieved on 2 occasions by greater than 50% by lumbar medial branch blocks and prior RFA for 18months.  No need t stop anticoagulants for thi low risk procedure per ASRA guidelines  Informed consent was obtained after describing risks and benefits of the procedure with the patient, this includes bleeding, bruising, infection, paralysis and medication side effects. .The patient wishes to proceed and has given written consent. The patient was placed in a prone position. The lumbar and sacral area was marked and prepped with Betadine. A 25-gauge 1-1/2 inch needle was inserted into the skin and subcutaneous tissue at 3 sites in one ML of 1% lidocaine was injected into each site. Then a 18-gauge 10 cm radio frequency needle with a 1 cm curved active tip was inserted targeting the Right S1 SAP/sacral ala junction. Bone contact was made and confirmed with lateral imaging.  motor stimulation at 2 Hz confirm proper needle location followed by injection of 1ml 2% MPF lidocaine. Then the Right L5 SAP/transverse process junction was targeted. Bone contact was made and confirmed with lateral imaging.  motor stimulation at 2 Hz confirm proper needle location followed by injection of 1ml 2% MPF lidocaine. Then the Right L4 SAP/transverse process junction was targeted. Bone contact was made and confirmed with lateral imaging. motor stimulation at 2 Hz confirm proper needle location followed by injection of 1ml 2% MPF lidocaine. Radio frequency lesion being at 80C for 90 seconds was performed. Needles were removed. Post procedure instructions and vital signs were performed. Patient tolerated procedure well. Followup appointment was given.  

## 2018-08-03 DIAGNOSIS — M25561 Pain in right knee: Secondary | ICD-10-CM | POA: Diagnosis not present

## 2018-08-03 DIAGNOSIS — M7061 Trochanteric bursitis, right hip: Secondary | ICD-10-CM | POA: Diagnosis not present

## 2018-08-04 DIAGNOSIS — Z7901 Long term (current) use of anticoagulants: Secondary | ICD-10-CM | POA: Diagnosis not present

## 2018-08-04 DIAGNOSIS — F315 Bipolar disorder, current episode depressed, severe, with psychotic features: Secondary | ICD-10-CM | POA: Diagnosis not present

## 2018-08-04 DIAGNOSIS — Z952 Presence of prosthetic heart valve: Secondary | ICD-10-CM | POA: Diagnosis not present

## 2018-08-30 DIAGNOSIS — Z7901 Long term (current) use of anticoagulants: Secondary | ICD-10-CM | POA: Diagnosis not present

## 2018-08-30 DIAGNOSIS — Z952 Presence of prosthetic heart valve: Secondary | ICD-10-CM | POA: Diagnosis not present

## 2018-09-07 ENCOUNTER — Other Ambulatory Visit: Payer: Self-pay

## 2018-09-07 ENCOUNTER — Encounter: Payer: Self-pay | Admitting: Physical Medicine & Rehabilitation

## 2018-09-07 ENCOUNTER — Encounter: Payer: Medicare Other | Attending: Physical Medicine & Rehabilitation | Admitting: Physical Medicine & Rehabilitation

## 2018-09-07 VITALS — BP 114/77 | HR 60 | Ht 66.0 in | Wt 206.0 lb

## 2018-09-07 DIAGNOSIS — Z79899 Other long term (current) drug therapy: Secondary | ICD-10-CM | POA: Diagnosis not present

## 2018-09-07 DIAGNOSIS — E669 Obesity, unspecified: Secondary | ICD-10-CM | POA: Diagnosis not present

## 2018-09-07 DIAGNOSIS — M545 Low back pain: Secondary | ICD-10-CM | POA: Insufficient documentation

## 2018-09-07 DIAGNOSIS — E782 Mixed hyperlipidemia: Secondary | ICD-10-CM | POA: Diagnosis not present

## 2018-09-07 DIAGNOSIS — R682 Dry mouth, unspecified: Secondary | ICD-10-CM | POA: Diagnosis not present

## 2018-09-07 DIAGNOSIS — Z5181 Encounter for therapeutic drug level monitoring: Secondary | ICD-10-CM | POA: Diagnosis not present

## 2018-09-07 DIAGNOSIS — M5416 Radiculopathy, lumbar region: Secondary | ICD-10-CM | POA: Diagnosis not present

## 2018-09-07 DIAGNOSIS — G629 Polyneuropathy, unspecified: Secondary | ICD-10-CM | POA: Diagnosis not present

## 2018-09-07 DIAGNOSIS — G609 Hereditary and idiopathic neuropathy, unspecified: Secondary | ICD-10-CM | POA: Diagnosis not present

## 2018-09-07 DIAGNOSIS — M47816 Spondylosis without myelopathy or radiculopathy, lumbar region: Secondary | ICD-10-CM | POA: Insufficient documentation

## 2018-09-07 DIAGNOSIS — Z79891 Long term (current) use of opiate analgesic: Secondary | ICD-10-CM | POA: Diagnosis not present

## 2018-09-07 MED ORDER — BACLOFEN 10 MG PO TABS: 5.0000 mg | ORAL_TABLET | Freq: Every evening | ORAL | 1 refills | Status: DC | PRN

## 2018-09-07 MED ORDER — ACETAMINOPHEN-CODEINE #4 300-60 MG PO TABS: 1.0000 | ORAL_TABLET | Freq: Four times a day (QID) | ORAL | 2 refills | Status: DC | PRN

## 2018-09-07 NOTE — Progress Notes (Signed)
Subjective:    Patient ID: Emily Peck, female    DOB: [birth year], 63 y.o.    MRN: [id number] 07/07/2018 RightL5 dorsal ramus., Right L4 and Right L3 medial branch radio frequency neurotomy under fluoroscopic guidance   04/17/2018 Left L5 dorsal ramus., left L4 and left L3 medial branch radio frequency neurotomy under fluoroscopic guidance HPI  63 year old female with history of chronic low back pain.  Despite her severe visual impairment she continues to work at the [employer]  Her chief complaint today is Right hip pain, as well as right groin pain worsened with sitting , improves with walking No numbness or tingling She has had no falls or other injury to that area.  She has been evaluated by her orthopedic surgeon who took x-rays but did not feel that this was related to her hip joint.  Pain Inventory Average Pain 8 Pain Right Now 8 My pain is sharp and stabbing  In the last 24 hours, has pain interfered with the following? General activity 7 Relation with others 8 Enjoyment of life 8 What TIME of day is your pain at its worst? daytime Sleep (in general) Fair  Pain is worse with: walking, bending, sitting and standing Pain improves with: medication Relief from Meds: 4  Mobility use a cane ability to climb steps?  no do you drive?  no  Function disabled: date disabled .  Neuro/Psych trouble walking  Prior Studies Any changes since last visit?  no  Physicians involved in your care Any changes since last visit?  no   Family History  Problem Relation Age of Onset  . Cancer Mother   . Diabetes Mother    Social History   Socioeconomic History  . Marital status: Single    Spouse name: Not on file  . Number of children: Not on file  . Years of education: Not on file  . Highest education level: Not on file  Occupational History  . Not on file  Social Needs  . Financial resource strain: Not on file  . Food insecurity    Worry: Not on  file    Inability: Not on file  . Transportation needs    Medical: Not on file    Non-medical: Not on file  Tobacco Use  . Smoking status: Never Smoker  . Smokeless tobacco: Never Used  Substance and Sexual Activity  . Alcohol use: No  . Drug use: No  . Sexual activity: Not on file  Lifestyle  . Physical activity    Days per week: Not on file    Minutes per session: Not on file  . Stress: Not on file  Relationships  . Social Herbalist on phone: Not on file    Gets together: Not on file    Attends religious service: Not on file    Active member of club or organization: Not on file    Attends meetings of clubs or organizations: Not on file    Relationship status: Not on file  Other Topics Concern  . Not on file  Social History Narrative  . Not on file   Past Surgical History:  Procedure Laterality Date  . ABDOMINAL HYSTERECTOMY    . BACK SURGERY    . EYE SURGERY     eye removed right   . KNEE ARTHROSCOPY    . 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   Procedure: TOTAL KNEE ARTHROPLASTY;  Surgeon: Melrose Nakayama, MD;  Location: [facility];  Service: Orthopedics;  Laterality: Right;   Past Medical History:  Diagnosis Date  . Anxiety   . Clotting disorder ([place])   . Depression   . Dyspnea   . GERD (gastroesophageal reflux disease)   . Headache   . Lumbosacral spondylosis without myelopathy   . Neuropathy   . Postlaminectomy syndrome, lumbar region   . Postlaminectomy syndrome, thoracic region   . Stroke ([facility])    BP 114/77   Pulse 60   Ht 5\' 6"  (1.676 m)   Wt 206 lb (93.4 kg)   SpO2 97%   BMI 33.25 kg/m   Opioid Risk Score:   Fall Risk Score:  `1  Depression screen PHQ 2/9  Depression screen [facility] 2/9 06/09/2018 07/07/2017 04/11/2017 01/03/2017 06/05/2015 05/08/2015 11/10/2014  Decreased Interest 1 0 0 0 2 2 0  Down, Depressed, Hopeless 1 - 0 0 1 1 0  PHQ - 2 Score 2 0 0 0 3 3 0  Altered sleeping - - - - - 2 -  Tired, decreased energy - - - - - 2 -  Change in  appetite - - - - - 2 -  Feeling bad or failure about yourself  - - - - - 2 -  Trouble concentrating - - - - - 2 -  Moving slowly or fidgety/restless - - - - - 1 -  Suicidal thoughts - - - - - 0 -  PHQ-9 Score - - - - - 14 -  Difficult doing work/chores - - - - - Somewhat difficult -  Some recent data might be hidden     Review of Systems  Constitutional: Negative.   HENT: Negative.   Eyes: Negative.   Respiratory: Negative.   Cardiovascular: Negative.   Gastrointestinal: Negative.   Endocrine: Negative.   Genitourinary: Negative.   Musculoskeletal: Positive for arthralgias, back pain, gait problem and myalgias.  Skin: Negative.   Allergic/Immunologic: Negative.   Hematological: Negative.   Psychiatric/Behavioral: Negative.   All other systems reviewed and are negative.      Objective:   Physical Exam Vitals signs and nursing note reviewed.  Constitutional:      Appearance: Normal appearance.  Musculoskeletal:     Comments: Tenderness to palpation right PSIS area Faber's refers pain to the groin area on the right side and to a lesser extent to the PSIS area The patient has limited lumbar flexion but this is around 50% of normal lumbar extension is less than 25% of normal, lumbar lateral flexion is approximately 25% of normal Negative straight leg raising bilaterally. No evidence of right knee effusion no pain with knee range of motion there is some tenderness to touch over the right patellar tendon. The right hip joint has normal range of motion with internal/external rotation and has no pain during range of motion.  Skin:    General: Skin is warm and dry.  Neurological:     Mental Status: She is alert.     Gait: Gait abnormal.     Comments: Motor strength is 5/5 bilateral hip flexor knee extensor ankle dorsiflex. Sensation is reduced in the right L3-L4-L5 S1 distribution.  The patient has no evidence of foot drop during ambulation she does have mildly antalgic gait  with short step length but this is also related to her visual impairment and she uses a blind cane           Assessment & Plan:  1.  Chronic low back pain, lumbar spondylosis improved after  radiofrequency neurotomy as outlined above  2.  Right hip pain, per orthopedic not hip joint related.  Signs and symptoms most consistent with sacroiliac disorder we will schedule for diagnostic/therapeutic sacroiliac injection.  We will continue Tylenol #4 with codeine 4 times daily for pain as well as baclofen 5 mg nightly. Patient is due for oral swab for toxicology PDMP reviewed

## 2018-09-07 NOTE — Patient Instructions (Signed)
Sacroiliac Joint Dysfunction  Sacroiliac joint dysfunction is a condition that causes inflammation on one or both sides of the sacroiliac (SI) joint. The SI joint connects the lower part of the spine (sacrum) with the two upper portions of the pelvis (ilium). This condition causes deep aching or burning pain in the low back. In some cases, the pain may also spread into one or both buttocks, hips, or thighs. What are the causes? This condition may be caused by:  Pregnancy. During pregnancy, extra stress is put on the SI joints because the pelvis widens.  Injury, such as: ? Injuries from car accidents. ? Sports-related injuries. ? Work-related injuries.  Having one leg that is shorter than the other.  Conditions that affect the joints, such as: ? Rheumatoid arthritis. ? Gout. ? Psoriatic arthritis. ? Joint infection (septic arthritis). Sometimes, the cause of SI joint dysfunction is not known. What are the signs or symptoms? Symptoms of this condition include:  Aching or burning pain in the lower back. The pain may also spread to other areas, such as: ? Buttocks. ? Groin. ? Thighs.  Muscle spasms in or around the painful areas.  Increased pain when standing, walking, running, stair climbing, bending, or lifting. How is this diagnosed? This condition is diagnosed with a physical exam and medical history. During the exam, the health care provider may move one or both of your legs to different positions to check for pain. Various tests may be done to confirm the diagnosis, including:  Imaging tests to look for other causes of pain. These may include: ? MRI. ? CT scan. ? Bone scan.  Diagnostic injection. A numbing medicine is injected into the SI joint using a needle. If your pain is temporarily improved or stopped after the injection, this can indicate that SI joint dysfunction is the problem. How is this treated? Treatment depends on the cause and severity of your condition.  Treatment options may include:  Ice or heat applied to the lower back area after an injury. This may help reduce pain and muscle spasms.  Medicines to relieve pain or inflammation or to relax the muscles.  Wearing a back brace (sacroiliac brace) to help support the joint while your back is healing.  Physical therapy to increase muscle strength around the joint and flexibility at the joint. This may also involve learning proper body positions and ways of moving to relieve stress on the joint.  Direct manipulation of the SI joint.  Injections of steroid medicine into the joint to reduce pain and swelling.  Radiofrequency ablation to burn away nerves that are carrying pain messages from the joint.  Use of a device that provides electrical stimulation to help reduce pain at the joint.  Surgery to put in screws and plates that limit or prevent joint motion. This is rare. Follow these instructions at home: Medicines  Take over-the-counter and prescription medicines only as told by your health care provider.  Do not drive or use heavy machinery while taking prescription pain medicine.  If you are taking prescription pain medicine, take actions to prevent or treat constipation. Your health care provider may recommend that you: ? Drink enough fluid to keep your urine pale yellow. ? Eat foods that are high in fiber, such as fresh fruits and vegetables, whole grains, and beans. ? Limit foods that are high in fat and processed sugars, such as fried or sweet foods. ? Take an over-the-counter or prescription medicine for constipation. If you have a brace:    Wear the brace as told by your health care provider. Remove it only as told by your health care provider.  Keep the brace clean.  If the brace is not waterproof: ? Do not let it get wet. ? Cover it with a watertight covering when you take a bath or a shower. Managing pain, stiffness, and swelling      Icing can help with pain and  swelling. Heat may help with muscle tension or spasms. Ask your health care provider if you should use ice or heat.  If directed, put ice on the affected area: ? If you have a removable brace, remove it as told by your health care provider. ? Put ice in a plastic bag. ? Place a towel between your skin and the bag. ? Leave the ice on for 20 minutes, 2-3 times a day.  If directed, apply heat to the affected area. Use the heat source that your health care provider recommends, such as a moist heat pack or a heating pad. ? Place a towel between your skin and the heat source. ? Leave the heat on for 20-30 minutes. ? Remove the heat if your skin turns bright red. This is especially important if you are unable to feel pain, heat, or cold. You may have a greater risk of getting burned. General instructions  Rest as needed. Ask your health care provider what activities are safe for you.  Return to your normal activities as told by your health care provider.  Exercise as directed by your health care provider or physical therapist.  Do not use any products that contain nicotine or tobacco, such as cigarettes and e-cigarettes. These can delay bone healing. If you need help quitting, ask your health care provider.  Keep all follow-up visits as told by your health care provider. This is important. Contact a health care provider if:  Your pain is not controlled with medicine.  You have a fever.  Your pain is getting worse. Get help right away if:  You have weakness, numbness, or tingling in your legs or feet.  You lose control of your bladder or bowel. Summary  Sacroiliac joint dysfunction is a condition that causes inflammation on one or both sides of the sacroiliac (SI) joint.  This condition causes deep aching or burning pain in the low back. In some cases, the pain may also spread into one or both buttocks, hips, or thighs.  Treatment depends on the cause and severity of your condition.  It may include medicines to reduce pain and swelling or to relax muscles. This information is not intended to replace advice given to you by your health care provider. Make sure you discuss any questions you have with your health care provider. Document Released: 05/17/2008 Document Revised: 10/15/2017 Document Reviewed: 03/31/2017 Elsevier Patient Education  2020 [employer]  

## 2018-09-11 LAB — DRUG TOX MONITOR 1 W/CONF, ORAL FLD
Amphetamines: NEGATIVE ng/mL (ref <10)
Barbiturates: NEGATIVE ng/mL (ref <10)
Benzodiazepines: NEGATIVE ng/mL (ref <0.50)
Buprenorphine: NEGATIVE ng/mL (ref <0.10)
Cocaine: NEGATIVE ng/mL (ref <5.0)
Codeine: >250 ng/mL — ABNORMAL HIGH (ref <2.5)
Cotinine: 11.4 ng/mL — ABNORMAL HIGH (ref <5.0)
Dihydrocodeine: NEGATIVE ng/mL (ref <2.5)
Fentanyl: NEGATIVE ng/mL (ref <0.10)
Heroin Metabolite: NEGATIVE ng/mL (ref <1.0)
Hydrocodone: 4.5 ng/mL — ABNORMAL HIGH (ref <2.5)
Hydromorphone: NEGATIVE ng/mL (ref <2.5)
MARIJUANA: NEGATIVE ng/mL (ref <2.5)
MDMA: NEGATIVE ng/mL (ref <10)
Meprobamate: NEGATIVE ng/mL (ref <2.5)
Methadone: NEGATIVE ng/mL (ref <5.0)
Morphine: NEGATIVE ng/mL (ref <2.5)
Nicotine Metabolite: POSITIVE ng/mL — AB (ref <5.0)
Norhydrocodone: NEGATIVE ng/mL (ref <2.5)
Noroxycodone: NEGATIVE ng/mL (ref <2.5)
Opiates: POSITIVE ng/mL — AB (ref <2.5)
Oxycodone: NEGATIVE ng/mL (ref <2.5)
Oxymorphone: NEGATIVE ng/mL (ref <2.5)
Phencyclidine: NEGATIVE ng/mL (ref <10)
Tapentadol: NEGATIVE ng/mL (ref <5.0)
Tramadol: NEGATIVE ng/mL (ref <5.0)
Zolpidem: NEGATIVE ng/mL (ref <5.0)

## 2018-09-11 LAB — DRUG TOX ALC METAB W/CON, ORAL FLD: Alcohol Metabolite: NEGATIVE ng/mL (ref <25)

## 2018-09-16 ENCOUNTER — Telehealth: Payer: Self-pay

## 2018-09-17 ENCOUNTER — Telehealth: Payer: Self-pay | Admitting: *Deleted

## 2018-09-17 NOTE — Telephone Encounter (Signed)
Urine drug screen for this encounter is consistent for prescribed medication 

## 2018-09-27 DIAGNOSIS — Z952 Presence of prosthetic heart valve: Secondary | ICD-10-CM | POA: Diagnosis not present

## 2018-09-27 DIAGNOSIS — Z7901 Long term (current) use of anticoagulants: Secondary | ICD-10-CM | POA: Diagnosis not present

## 2018-09-29 ENCOUNTER — Other Ambulatory Visit: Payer: Self-pay

## 2018-09-29 ENCOUNTER — Encounter (HOSPITAL_BASED_OUTPATIENT_CLINIC_OR_DEPARTMENT_OTHER): Payer: Medicare Other | Admitting: Physical Medicine & Rehabilitation

## 2018-09-29 ENCOUNTER — Encounter: Payer: Self-pay | Admitting: Physical Medicine & Rehabilitation

## 2018-09-29 DIAGNOSIS — M545 Low back pain: Secondary | ICD-10-CM | POA: Diagnosis not present

## 2018-09-29 DIAGNOSIS — M47816 Spondylosis without myelopathy or radiculopathy, lumbar region: Secondary | ICD-10-CM | POA: Diagnosis not present

## 2018-09-29 DIAGNOSIS — M47817 Spondylosis without myelopathy or radiculopathy, lumbosacral region: Secondary | ICD-10-CM | POA: Diagnosis not present

## 2018-09-29 MED ORDER — METHOCARBAMOL 500 MG PO TABS: 500.0000 mg | ORAL_TABLET | Freq: Three times a day (TID) | ORAL | 0 refills | Status: DC

## 2018-09-29 NOTE — Progress Notes (Signed)
Bilateral , L1, L2 medial branch blocks under fluoroscopic guidance  Indication: Lumbar pain which is not relieved by medication management or other conservative care and interfering with self-care and mobility.  Informed consent was obtained after describing risks and benefits of the procedure with the patient, this includes bleeding, bruising, infection, paralysis and medication side effects. The patient wishes to proceed and has given written consent. The patient was placed in a prone position. The lumbar area was marked and prepped with Betadine. One ML of 1% lidocaine was injected into each of 6 areas into the skin and subcutaneous tissue. Then a 22-gauge 3.5 inch  spinal needle was inserted targeting the junction of the left L3 superior articular process /transverse process junction. Needle was advanced under fluoroscopic guidance. Bone contact was made. Isovue 200 was injected x0.5 mL demonstrating no intravascular uptake. Then a solution containing  2% MPF lidocaine was injected x0.5 mL. Then the left L2 superior articular process in transverse process junction was targeted. Bone contact was made. Omnipaque 180 was injected x0.5 mL demonstrating no intravascular uptake. Then a solution containing one ML of 4 mg per mL dexamethasone and 3 mL of 2% MPF lidocaine was injected x0.5 mL.this same procedure was performed on the right side using the same needle, technique, and injectate. Patient tolerated procedure well. Post procedure instructions were given. Please refer to post procedure form.

## 2018-09-29 NOTE — Progress Notes (Signed)
  PROCEDURE RECORD [facility]   Name: Emily Peck DOB:July 01, 1955 MRN: [id number]  Date:09/29/2018  Physician: Alysia Penna, MD    Nurse/CMA: Gerene Nedd CMA  Allergies:  Allergies  Allergen Reactions  . No Known Allergies     Consent Signed: Yes.    Is patient diabetic? No.  CBG today? NA  Pregnant: No. LMP: No LMP recorded. Patient has had a hysterectomy. (age 65-55)  Anticoagulants: no Anti-inflammatory: no Antibiotics: no  Procedure: Bilateral  L1-2 Medial branch blocks  Position: Prone   Start Time: 218pm End Time: 225pm Fluoro Time: 45  RN/CMA Brewer Hitchman CMA Manuelita Moxon CMA    Time 133pm 233    BP 128/80 147/84    Pulse 60 60    Respirations 16 16    O2 Sat 98 98    S/S 6 6    Pain Level 9/10 5/10     D/C home with transportation, patient A & O X 3, D/C instructions reviewed, and sits independently.

## 2018-09-29 NOTE — Patient Instructions (Signed)

## 2018-10-15 ENCOUNTER — Other Ambulatory Visit: Payer: Self-pay

## 2018-10-15 DIAGNOSIS — Z20822 Contact with and (suspected) exposure to covid-19: Secondary | ICD-10-CM

## 2018-10-15 DIAGNOSIS — R6889 Other general symptoms and signs: Secondary | ICD-10-CM | POA: Diagnosis not present

## 2018-10-17 LAB — NOVEL CORONAVIRUS, NAA: SARS-CoV-2, NAA: NOT DETECTED

## 2018-10-28 ENCOUNTER — Other Ambulatory Visit: Payer: Self-pay | Admitting: Physical Medicine & Rehabilitation

## 2018-10-31 DIAGNOSIS — Z7901 Long term (current) use of anticoagulants: Secondary | ICD-10-CM | POA: Diagnosis not present

## 2018-10-31 DIAGNOSIS — Z952 Presence of prosthetic heart valve: Secondary | ICD-10-CM | POA: Diagnosis not present

## 2018-11-15 DIAGNOSIS — Z23 Encounter for immunization: Secondary | ICD-10-CM | POA: Diagnosis not present

## 2018-11-17 DIAGNOSIS — F315 Bipolar disorder, current episode depressed, severe, with psychotic features: Secondary | ICD-10-CM | POA: Diagnosis not present

## 2018-11-26 DIAGNOSIS — Z952 Presence of prosthetic heart valve: Secondary | ICD-10-CM | POA: Diagnosis not present

## 2018-11-26 DIAGNOSIS — Z7901 Long term (current) use of anticoagulants: Secondary | ICD-10-CM | POA: Diagnosis not present

## 2018-12-02 ENCOUNTER — Other Ambulatory Visit: Payer: Self-pay | Admitting: Physical Medicine & Rehabilitation

## 2018-12-03 ENCOUNTER — Other Ambulatory Visit: Payer: Self-pay

## 2018-12-03 ENCOUNTER — Encounter: Payer: Self-pay | Admitting: Physical Medicine & Rehabilitation

## 2018-12-03 ENCOUNTER — Encounter: Payer: Medicare Other | Attending: Physical Medicine & Rehabilitation | Admitting: Physical Medicine & Rehabilitation

## 2018-12-03 VITALS — BP 88/64 | HR 62 | Temp 97.7°F | Ht 66.0 in | Wt 205.0 lb

## 2018-12-03 DIAGNOSIS — M7731 Calcaneal spur, right foot: Secondary | ICD-10-CM | POA: Diagnosis not present

## 2018-12-03 DIAGNOSIS — M47816 Spondylosis without myelopathy or radiculopathy, lumbar region: Secondary | ICD-10-CM | POA: Diagnosis not present

## 2018-12-03 DIAGNOSIS — M545 Low back pain: Secondary | ICD-10-CM | POA: Insufficient documentation

## 2018-12-03 DIAGNOSIS — M12272 Villonodular synovitis (pigmented), left ankle and foot: Secondary | ICD-10-CM | POA: Diagnosis not present

## 2018-12-03 DIAGNOSIS — M7918 Myalgia, other site: Secondary | ICD-10-CM | POA: Diagnosis not present

## 2018-12-03 DIAGNOSIS — M7671 Peroneal tendinitis, right leg: Secondary | ICD-10-CM | POA: Diagnosis not present

## 2018-12-03 DIAGNOSIS — M12271 Villonodular synovitis (pigmented), right ankle and foot: Secondary | ICD-10-CM | POA: Diagnosis not present

## 2018-12-03 DIAGNOSIS — M7732 Calcaneal spur, left foot: Secondary | ICD-10-CM | POA: Diagnosis not present

## 2018-12-03 DIAGNOSIS — M25775 Osteophyte, left foot: Secondary | ICD-10-CM | POA: Diagnosis not present

## 2018-12-03 DIAGNOSIS — M47817 Spondylosis without myelopathy or radiculopathy, lumbosacral region: Secondary | ICD-10-CM

## 2018-12-03 DIAGNOSIS — M7672 Peroneal tendinitis, left leg: Secondary | ICD-10-CM | POA: Diagnosis not present

## 2018-12-03 DIAGNOSIS — M25774 Osteophyte, right foot: Secondary | ICD-10-CM | POA: Diagnosis not present

## 2018-12-03 MED ORDER — ACETAMINOPHEN-CODEINE #4 300-60 MG PO TABS: 1.0000 | ORAL_TABLET | Freq: Four times a day (QID) | ORAL | 2 refills | Status: DC | PRN

## 2018-12-03 MED ORDER — METHOCARBAMOL 500 MG PO TABS: 500.0000 mg | ORAL_TABLET | Freq: Three times a day (TID) | ORAL | 1 refills | Status: DC

## 2018-12-03 NOTE — Patient Instructions (Signed)
Trigger Point Injection Trigger points are areas where you have pain. A trigger point injection is a shot given in the trigger point to help relieve pain for a few days to a few months. Common places for trigger points include:  The neck.  The shoulders.  The upper back.  The lower back. A trigger point injection will not cure long-term (chronic) pain permanently. These injections do not always work for every person. For some people, they can help to relieve pain for a few days to a few months. Tell a health care provider about:  Any allergies you have.  All medicines you are taking, including vitamins, herbs, eye drops, creams, and over-the-counter medicines.  Any problems you or family members have had with anesthetic medicines.  Any blood disorders you have.  Any surgeries you have had.  Any medical conditions you have. What are the risks? Generally, this is a safe procedure. However, problems may occur, including:  Infection.  Bleeding or bruising.  Allergic reaction to the injected medicine.  Irritation of the skin around the injection site. What happens before the procedure? Ask your health care provider about:  Changing or stopping your regular medicines. This is especially important if you are taking diabetes medicines or blood thinners.  Taking medicines such as aspirin and ibuprofen. These medicines can thin your blood. Do not take these medicines unless your health care provider tells you to take them.  Taking over-the-counter medicines, vitamins, herbs, and supplements. What happens during the procedure?   Your health care provider will feel for trigger points. A marker may be used to circle the area for the injection.  The skin over the trigger point will be washed with a germ-killing (antiseptic) solution.  A thin needle is used for the injection. You may feel pain or a twitching feeling when the needle enters the trigger point.  A numbing solution may  be injected into the trigger point. Sometimes a medicine to keep down inflammation is also injected.  Your health care provider may move the needle around the area where the trigger point is located until the tightness and twitching goes away.  After the injection, your health care provider may put gentle pressure over the injection site.  The injection site will be covered with a bandage (dressing). The procedure may vary among health care providers and hospitals. What can I expect after treatment? After treatment, you may have:  Soreness and stiffness for 1-2 days.  A dressing. This can be taken off in a few hours or as told by your health care provider. Follow these instructions at home: Injection site care  Remove your dressing as told by your health care provider.  Check your injection site every day for signs of infection. Check for: ? Redness, swelling, or pain. ? Fluid or blood. ? Warmth. ? Pus or a bad smell. Managing pain, stiffness, and swelling  If directed, put ice on the affected area. ? Put ice in a plastic bag. ? Place a towel between your skin and the bag. ? Leave the ice on for 20 minutes, 2-3 times a day. General instructions  If you were asked to stop your regular medicines, ask your health care provider when you may start taking them again.  Return to your normal activities as told by your health care provider. Ask your health care provider what activities are safe for you.  Do not take baths, swim, or use a hot tub until your health care provider approves.    You may be asked to see an occupational or physical therapist for exercises that reduce muscle strain and stretch the area of the trigger point.  Keep all follow-up visits as told by your health care provider. This is important. Contact a health care provider if:  Your pain comes back, and it is worse than before the injection. You may need more injections.  You have chills or a fever.  The  injection site becomes more painful, red, swollen, or warm to the touch. Summary  A trigger point injection is a shot given in the trigger point to help relieve pain for a few days to a few months.  Common places for trigger point injections are the neck, shoulder, upper back, and lower back.  These injections do not always work for every person, but for some people, the injections can help to relieve pain for a few days to a few months.  Contact a health care provider if symptoms come back or they are worse than before treatment. Also, get help if the injection site becomes more painful, red, swollen, or warm to the touch. This information is not intended to replace advice given to you by your health care provider. Make sure you discuss any questions you have with your health care provider. Document Released: 02/07/2011 Document Revised: 04/01/2018 Document Reviewed: 04/01/2018 Elsevier Patient Education  2020 Elsevier Inc[employer]  

## 2018-12-03 NOTE — Progress Notes (Addendum)
Subjective:    Patient ID: Emily Peck, female    DOB: [birth year], 63 y.o.   MRN: [id number]  HPI  63 year old visually impaired patient with chronic pain syndrome.  Her pain has been mostly in the low back but has been developing some upper back and neck pain as well.  She has had no falls or trauma. Left sided neck and shoulder pain is her main complaint.  She has no numbness or tingling in her left arm. Reviewed CT from 02/25/2018 of the cervical spine which did not demonstrate any significant cervical stenosis.  Still working 8hrs per day 5days a week.   Pain Inventory Average Pain 5 Pain Right Now 5 My pain is stabbing  In the last 24 hours, has pain interfered with the following? General activity 7 Relation with others 7 Enjoyment of life 8 What TIME of day is your pain at its worst? evening Sleep (in general) Fair  Pain is worse with: walking and standing Pain improves with: . Relief from Meds: .  Mobility use a cane  Function retired  Neuro/Psych weakness spasms  Prior Studies Any changes since last visit?  no  Physicians involved in your care Any changes since last visit?  no   Family History  Problem Relation Age of Onset  . Cancer Mother   . Diabetes Mother    Social History   Socioeconomic History  . Marital status: Single    Spouse name: Not on file  . Number of children: Not on file  . Years of education: Not on file  . Highest education level: Not on file  Occupational History  . Not on file  Social Needs  . Financial resource strain: Not on file  . Food insecurity    Worry: Not on file    Inability: Not on file  . Transportation needs    Medical: Not on file    Non-medical: Not on file  Tobacco Use  . Smoking status: Never Smoker  . Smokeless tobacco: Never Used  Substance and Sexual Activity  . Alcohol use: No  . Drug use: No  . Sexual activity: Not on file  Lifestyle  . Physical activity    Days per week: Not on file     Minutes per session: Not on file  . Stress: Not on file  Relationships  . Social Herbalist on phone: Not on file    Gets together: Not on file    Attends religious service: Not on file    Active member of club or organization: Not on file    Attends meetings of clubs or organizations: Not on file    Relationship status: Not on file  Other Topics Concern  . Not on file  Social History Narrative  . Not on file   Past Surgical History:  Procedure Laterality Date  . ABDOMINAL HYSTERECTOMY    . BACK SURGERY    . EYE SURGERY     eye removed right   . KNEE ARTHROSCOPY    . 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   Procedure: TOTAL KNEE ARTHROPLASTY;  Surgeon: Melrose Nakayama, MD;  Location: [place];  Service: Orthopedics;  Laterality: Right;   Past Medical History:  Diagnosis Date  . Anxiety   . Clotting disorder ([place])   . Depression   . Dyspnea   . GERD (gastroesophageal reflux disease)   . Headache   . Lumbosacral spondylosis without myelopathy   . Neuropathy   .  Postlaminectomy syndrome, lumbar region   . Postlaminectomy syndrome, thoracic region   . Stroke [facility])    There were no vitals taken for this visit.  Opioid Risk Score:   Fall Risk Score:  `1  Depression screen PHQ 2/9  Depression screen [facility] 2/9 06/09/2018 07/07/2017 04/11/2017 01/03/2017 06/05/2015 05/08/2015 11/10/2014  Decreased Interest 1 0 0 0 2 2 0  Down, Depressed, Hopeless 1 - 0 0 1 1 0  PHQ - 2 Score 2 0 0 0 3 3 0  Altered sleeping - - - - - 2 -  Tired, decreased energy - - - - - 2 -  Change in appetite - - - - - 2 -  Feeling bad or failure about yourself  - - - - - 2 -  Trouble concentrating - - - - - 2 -  Moving slowly or fidgety/restless - - - - - 1 -  Suicidal thoughts - - - - - 0 -  PHQ-9 Score - - - - - 14 -  Difficult doing work/chores - - - - - Somewhat difficult -  Some recent data might be hidden     Review of Systems  Constitutional: Negative.   HENT: Negative.   Eyes: Negative.    Respiratory: Negative.   Cardiovascular: Negative.   Gastrointestinal: Negative.   Endocrine: Negative.   Genitourinary: Negative.   Musculoskeletal: Negative.   Skin: Negative.   Allergic/Immunologic: Negative.   Neurological: Positive for dizziness and weakness.  Hematological: Negative.   Psychiatric/Behavioral: Negative.   All other systems reviewed and are negative.      Objective:   Physical Exam Vitals signs and nursing note reviewed.  Constitutional:      Appearance: Normal appearance.  HENT:     Head: Normocephalic and atraumatic.  Eyes:     General: No scleral icterus.       Right eye: No discharge.        Left eye: No discharge.  Neck:     Musculoskeletal: Muscular tenderness present.  Abdominal:     General: Abdomen is flat.  Skin:    General: Skin is warm and dry.  Neurological:     Mental Status: She is alert and oriented to person, place, and time.  Psychiatric:        Mood and Affect: Mood normal.        Thought Content: Thought content normal.        Judgment: Judgment normal.    Motor strength is 5/5 bilateral deltoid bicep tricep grip Sensation intact to light touch bilateral upper limbs. Cervical spine range of motion is 75% flexion extension lateral bending and rotation she states she has pain in all directions. She has tenderness to palpation in the left upper trapezius.  There is lesser degree of pain in the upper thoracic paraspinal area.  Spurling's negative     Assessment & Plan:  1.  Neck pain this appears to be mainly in the upper trapezius on the left side.  She has a tender point No evidence of cervical radiculopathy on examination.  This most likely represents myofascial pain syndrome will do trigger point injection left upper trap Trigger Point Injection  Indication: Left trap Myofascial pain not relieved by medication management and other conservative care.  Informed consent was obtained after describing risk and benefits of  the procedure with the patient, this includes bleeding, bruising, infection and medication side effects.  The patient wishes to proceed and has given written consent.  The patient was placed in a seated position.  The Left upper trap area was marked and prepped with Betadine.  It was entered with a 25-gauge 1-1/2 inch needle and 2 mL of 1% lidocaine was injected into each of 1 trigger points, after negative draw back for blood.  The patient tolerated the procedure well.  Post procedure instructions were given.  2.  Lumbar spondylosis without myelopathy she has had successful radiofrequency ablation L3-L4 medial branch and L5 dorsal ramus on the left 04/17/2018 on the right 07/07/2018

## 2018-12-24 DIAGNOSIS — Z952 Presence of prosthetic heart valve: Secondary | ICD-10-CM | POA: Diagnosis not present

## 2018-12-24 DIAGNOSIS — Z7901 Long term (current) use of anticoagulants: Secondary | ICD-10-CM | POA: Diagnosis not present

## 2019-01-04 DIAGNOSIS — M65872 Other synovitis and tenosynovitis, left ankle and foot: Secondary | ICD-10-CM | POA: Diagnosis not present

## 2019-01-04 DIAGNOSIS — M25572 Pain in left ankle and joints of left foot: Secondary | ICD-10-CM | POA: Diagnosis not present

## 2019-01-04 DIAGNOSIS — M25571 Pain in right ankle and joints of right foot: Secondary | ICD-10-CM | POA: Diagnosis not present

## 2019-01-04 DIAGNOSIS — J0100 Acute maxillary sinusitis, unspecified: Secondary | ICD-10-CM | POA: Diagnosis not present

## 2019-01-04 DIAGNOSIS — M65871 Other synovitis and tenosynovitis, right ankle and foot: Secondary | ICD-10-CM | POA: Diagnosis not present

## 2019-01-22 DIAGNOSIS — Z7901 Long term (current) use of anticoagulants: Secondary | ICD-10-CM | POA: Diagnosis not present

## 2019-01-22 DIAGNOSIS — Z952 Presence of prosthetic heart valve: Secondary | ICD-10-CM | POA: Diagnosis not present

## 2019-02-01 ENCOUNTER — Telehealth: Payer: Self-pay | Admitting: Neurology

## 2019-02-01 NOTE — Telephone Encounter (Signed)
Pt last seen aug 2019. No appt scheduled Pt also was last given gabapentin in aug 2019. She will need to be seen by the nurse practitioner before refilling medications.

## 2019-02-01 NOTE — Telephone Encounter (Signed)
1) Medication(s) Requested (by name): gabapentin 2) Pharmacy of Choice: [facility] [place], [address]

## 2019-02-01 NOTE — Telephone Encounter (Signed)
I called the pt and left a generic vm advising we will need an appt with the NP before providing any refills. I left the office number in the message.   When the patient calls back, please schedule a follow-up with Amy NP or Jinny Blossom NP.

## 2019-02-03 ENCOUNTER — Other Ambulatory Visit: Payer: Self-pay | Admitting: Neurology

## 2019-02-03 MED ORDER — GABAPENTIN 600 MG PO TABS: 1200.0000 mg | ORAL_TABLET | Freq: Three times a day (TID) | ORAL | 0 refills | Status: DC

## 2019-02-03 NOTE — Telephone Encounter (Signed)
Pt called and was offered the soonest appt that was available on the 7th but she states she can not come on that date. Pt took next available which is in January but the pt states she is needing medication to cover her till then. Please advise.

## 2019-03-03 ENCOUNTER — Other Ambulatory Visit: Payer: Self-pay | Admitting: Family

## 2019-03-03 DIAGNOSIS — Z1231 Encounter for screening mammogram for malignant neoplasm of breast: Secondary | ICD-10-CM

## 2019-03-09 ENCOUNTER — Encounter: Payer: Self-pay | Admitting: Physical Medicine & Rehabilitation

## 2019-03-09 ENCOUNTER — Encounter: Payer: Medicare Other | Attending: Physical Medicine & Rehabilitation | Admitting: Physical Medicine & Rehabilitation

## 2019-03-09 ENCOUNTER — Other Ambulatory Visit: Payer: Self-pay

## 2019-03-09 VITALS — BP 109/74 | HR 67 | Temp 97.1°F | Ht 66.0 in | Wt 205.0 lb

## 2019-03-09 DIAGNOSIS — Z96651 Presence of right artificial knee joint: Secondary | ICD-10-CM

## 2019-03-09 DIAGNOSIS — M545 Low back pain, unspecified: Secondary | ICD-10-CM

## 2019-03-09 DIAGNOSIS — M47816 Spondylosis without myelopathy or radiculopathy, lumbar region: Secondary | ICD-10-CM | POA: Insufficient documentation

## 2019-03-09 DIAGNOSIS — G8929 Other chronic pain: Secondary | ICD-10-CM | POA: Insufficient documentation

## 2019-03-09 DIAGNOSIS — M47817 Spondylosis without myelopathy or radiculopathy, lumbosacral region: Secondary | ICD-10-CM

## 2019-03-09 MED ORDER — ACETAMINOPHEN-CODEINE #4 300-60 MG PO TABS: 1.0000 | ORAL_TABLET | Freq: Four times a day (QID) | ORAL | 2 refills | Status: DC | PRN

## 2019-03-09 NOTE — Progress Notes (Signed)
Subjective:    Patient ID: Emily Peck, female    DOB: 10-05-1955, 64 y.o.   MRN: [id number]  HPI  64 year old visually impaired patient with history of thoracic postlaminectomy syndrome, depression, right TKR who nevertheless continues to work at the [employer]. Left sided trapezius pain improved  Pt feels back pain is more midline, patient states she falls from time to time but does not recall any significant injury and has not been hospitalized or seen in the ED for this recently  The patient had left L3-L4 medial branch and left L5 dorsal ramus radiofrequency neurotomy performed in February 2020 and the right side was performed at the same levels in May 2020. Pain Inventory Average Pain 66 Pain Right Now 4 My pain is intermittent, sharp and aching  In the last 24 hours, has pain interfered with the following? General activity 7 Relation with others 7 Enjoyment of life 8 What TIME of day is your pain at its worst? daytime Sleep (in general) Fair  Pain is worse with: walking and standing Pain improves with: medication Relief from Meds: 4  Mobility use a cane ability to climb steps?  yes do you drive?  no  Function disabled: date disabled .  Neuro/Psych weakness spasms  Prior Studies Any changes since last visit?  no  Physicians involved in your care Any changes since last visit?  no   Family History  Problem Relation Age of Onset  . Cancer Mother   . Diabetes Mother    Social History   Socioeconomic History  . Marital status: Single    Spouse name: Not on file  . Number of children: Not on file  . Years of education: Not on file  . Highest education level: Not on file  Occupational History  . Not on file  Tobacco Use  . Smoking status: Never Smoker  . Smokeless tobacco: Never Used  Substance and Sexual Activity  . Alcohol use: No  . Drug use: No  . Sexual activity: Not on file  Other Topics Concern  . Not on file  Social  History Narrative  . Not on file   Social Determinants of Health   Financial Resource Strain:   . Difficulty of Paying Living Expenses: Not on file  Food Insecurity:   . Worried About Charity fundraiser in the Last Year: Not on file  . Ran Out of Food in the Last Year: Not on file  Transportation Needs:   . Lack of Transportation (Medical): Not on file  . Lack of Transportation (Non-Medical): Not on file  Physical Activity:   . Days of Exercise per Week: Not on file  . Minutes of Exercise per Session: Not on file  Stress:   . Feeling of Stress : Not on file  Social Connections:   . Frequency of Communication with Friends and Family: Not on file  . Frequency of Social Gatherings with Friends and Family: Not on file  . Attends Religious Services: Not on file  . Active Member of Clubs or Organizations: Not on file  . Attends Archivist Meetings: Not on file  . Marital Status: Not on file   Past Surgical History:  Procedure Laterality Date  . ABDOMINAL HYSTERECTOMY    . BACK SURGERY    . EYE SURGERY     eye removed right   . KNEE ARTHROSCOPY    . 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   Procedure: TOTAL KNEE ARTHROPLASTY;  Surgeon: Melrose Nakayama, MD;  Location: [place];  Service: Orthopedics;  Laterality: Right;   Past Medical History:  Diagnosis Date  . Anxiety   . Clotting disorder ([place])   . Depression   . Dyspnea   . GERD (gastroesophageal reflux disease)   . Headache   . Lumbosacral spondylosis without myelopathy   . Neuropathy   . Postlaminectomy syndrome, lumbar region   . Postlaminectomy syndrome, thoracic region   . Stroke ([place])    BP 109/74 Comment: reported  Pulse 67 Comment: reported  Temp (!) 97.1 F (36.2 C) Comment: reported  Ht 5\' 6"  (1.676 m) Comment: reported  Wt 205 lb (93 kg) Comment: reported  BMI 33.09 kg/m   Opioid Risk Score:   Fall Risk Score:  `1  Depression screen PHQ 2/9  Depression screen [facility] 2/9 03/09/2019 06/09/2018  07/07/2017 04/11/2017 01/03/2017 06/05/2015 05/08/2015  Decreased Interest 0 1 0 0 0 2 2  Down, Depressed, Hopeless 0 1 - 0 0 1 1  PHQ - 2 Score 0 2 0 0 0 3 3  Altered sleeping - - - - - - 2  Tired, decreased energy - - - - - - 2  Change in appetite - - - - - - 2  Feeling bad or failure about yourself  - - - - - - 2  Trouble concentrating - - - - - - 2  Moving slowly or fidgety/restless - - - - - - 1  Suicidal thoughts - - - - - - 0  PHQ-9 Score - - - - - - 14  Difficult doing work/chores - - - - - - Somewhat difficult  Some recent data might be hidden    Review of Systems  Constitutional: Negative.   HENT: Negative.   Eyes: Negative.   Respiratory: Negative.   Cardiovascular: Negative.   Gastrointestinal: Negative.   Endocrine: Negative.   Genitourinary: Negative.   Musculoskeletal: Positive for back pain.       Spasms  Skin: Negative.   Allergic/Immunologic: Negative.   Neurological: Positive for weakness.  Hematological: Bruises/bleeds easily.       Warfarin  Psychiatric/Behavioral: Negative.   All other systems reviewed and are negative.      Objective:   Physical Exam Nursing note reviewed.  Neurological:     Mental Status: She is alert.   Speech without evidence of dysarthria or aphasia.  She is oriented x3        Assessment & Plan:  #1.  History lumbar spondylosis with chronic low back pain.  We reviewed imaging studies she has not had a lumbar x-ray.  Her last lumbar MRI was in 2010 demonstrating lumbar spondylosis in the lower lumbar levels but no evidence of lumbar spinal stenosis.  The patient has had several falls and complains of increased pain.  We discussed that an x-ray may be helpful in this situation make sure she does not have any compression fracture.  In addition we discussed that she may be noticing her radio frequency neurotomy starting to wear off. Will order x-ray of the lumbar spine 2 views at [facility] imaging If this does not show any new  pathology will repeat the L3-L4 medial branch and L5 dorsal ramus radiofrequency neurotomy on the left side. Discussed with patient agrees with plan She will continue Tylenol No. 4 1 tablet 4 times daily

## 2019-03-10 ENCOUNTER — Telehealth: Payer: Self-pay

## 2019-03-17 ENCOUNTER — Other Ambulatory Visit: Payer: Self-pay

## 2019-03-17 ENCOUNTER — Ambulatory Visit (INDEPENDENT_AMBULATORY_CARE_PROVIDER_SITE_OTHER): Payer: Medicare Other

## 2019-03-17 DIAGNOSIS — R0602 Shortness of breath: Secondary | ICD-10-CM | POA: Diagnosis not present

## 2019-03-17 DIAGNOSIS — R0789 Other chest pain: Secondary | ICD-10-CM | POA: Diagnosis not present

## 2019-03-25 DIAGNOSIS — Z952 Presence of prosthetic heart valve: Secondary | ICD-10-CM | POA: Diagnosis not present

## 2019-03-25 DIAGNOSIS — Z7901 Long term (current) use of anticoagulants: Secondary | ICD-10-CM | POA: Diagnosis not present

## 2019-03-30 ENCOUNTER — Encounter: Payer: Self-pay | Admitting: Cardiology

## 2019-03-30 ENCOUNTER — Encounter: Payer: Self-pay | Admitting: Family Medicine

## 2019-03-30 ENCOUNTER — Ambulatory Visit (INDEPENDENT_AMBULATORY_CARE_PROVIDER_SITE_OTHER): Payer: Medicare Other | Admitting: Cardiology

## 2019-03-30 ENCOUNTER — Ambulatory Visit: Payer: Self-pay | Admitting: Cardiology

## 2019-03-30 ENCOUNTER — Ambulatory Visit (INDEPENDENT_AMBULATORY_CARE_PROVIDER_SITE_OTHER): Payer: Medicare Other | Admitting: Family Medicine

## 2019-03-30 ENCOUNTER — Other Ambulatory Visit: Payer: Self-pay

## 2019-03-30 ENCOUNTER — Telehealth: Payer: Self-pay

## 2019-03-30 VITALS — BP 118/69 | HR 66 | Temp 98.1°F | Ht 66.0 in | Wt 203.1 lb

## 2019-03-30 VITALS — BP 106/64 | HR 70 | Temp 97.5°F | Ht 66.0 in | Wt 205.0 lb

## 2019-03-30 DIAGNOSIS — G629 Polyneuropathy, unspecified: Secondary | ICD-10-CM | POA: Diagnosis not present

## 2019-03-30 DIAGNOSIS — E669 Obesity, unspecified: Secondary | ICD-10-CM | POA: Diagnosis not present

## 2019-03-30 DIAGNOSIS — Z8673 Personal history of transient ischemic attack (TIA), and cerebral infarction without residual deficits: Secondary | ICD-10-CM

## 2019-03-30 DIAGNOSIS — R0789 Other chest pain: Secondary | ICD-10-CM

## 2019-03-30 DIAGNOSIS — R5383 Other fatigue: Secondary | ICD-10-CM

## 2019-03-30 DIAGNOSIS — R0609 Other forms of dyspnea: Secondary | ICD-10-CM

## 2019-03-30 MED ORDER — GABAPENTIN 600 MG PO TABS: 1200.0000 mg | ORAL_TABLET | Freq: Three times a day (TID) | ORAL | 3 refills | Status: DC

## 2019-03-30 NOTE — Patient Instructions (Signed)
We will continue gabapentin 1200mg  three times daily, we will try a neuropathy cream. Listen for a phone call from pharmacy.   Fall precautions, use your cane..lol!  Follow up with PCP and pain management as advised    Follow up in 1 year, sooner if needed  Fall Prevention in the Home, Adult Falls can cause injuries. They can happen to people of all ages. There are many things you can do to make your home safe and to help prevent falls. Ask for help when making these changes, if needed. What actions can I take to prevent falls? General Instructions  Use good lighting in all rooms. Replace any light bulbs that burn out.  Turn on the lights when you go into a dark area. Use night-lights.  Keep items that you use often in easy-to-reach places. Lower the shelves around your home if necessary.  Set up your furniture so you have a clear path. Avoid moving your furniture around.  Do not have throw rugs and other things on the floor that can make you trip.  Avoid walking on wet floors.  If any of your floors are uneven, fix them.  Add color or contrast paint or tape to clearly mark and help you see: ? Any grab bars or handrails. ? First and last steps of stairways. ? Where the edge of each step is.  If you use a stepladder: ? Make sure that it is fully opened. Do not climb a closed stepladder. ? Make sure that both sides of the stepladder are locked into place. ? Ask someone to hold the stepladder for you while you use it.  If there are any pets around you, be aware of where they are. What can I do in the bathroom?      Keep the floor dry. Clean up any water that spills onto the floor as soon as it happens.  Remove soap buildup in the tub or shower regularly.  Use non-skid mats or decals on the floor of the tub or shower.  Attach bath mats securely with double-sided, non-slip rug tape.  If you need to sit down in the shower, use a plastic, non-slip stool.  Install  grab bars by the toilet and in the tub and shower. Do not use towel bars as grab bars. What can I do in the bedroom?  Make sure that you have a light by your bed that is easy to reach.  Do not use any sheets or blankets that are too big for your bed. They should not hang down onto the floor.  Have a firm chair that has side arms. You can use this for support while you get dressed. What can I do in the kitchen?  Clean up any spills right away.  If you need to reach something above you, use a strong step stool that has a grab bar.  Keep electrical cords out of the way.  Do not use floor polish or wax that makes floors slippery. If you must use wax, use non-skid floor wax. What can I do with my stairs?  Do not leave any items on the stairs.  Make sure that you have a light switch at the top of the stairs and the bottom of the stairs. If you do not have them, ask someone to add them for you.  Make sure that there are handrails on both sides of the stairs, and use them. Fix handrails that are broken or loose. Make sure that  handrails are as long as the stairways.  Install non-slip stair treads on all stairs in your home.  Avoid having throw rugs at the top or bottom of the stairs. If you do have throw rugs, attach them to the floor with carpet tape.  Choose a carpet that does not hide the edge of the steps on the stairway.  Check any carpeting to make sure that it is firmly attached to the stairs. Fix any carpet that is loose or worn. What can I do on the outside of my home?  Use bright outdoor lighting.  Regularly fix the edges of walkways and driveways and fix any cracks.  Remove anything that might make you trip as you walk through a door, such as a raised step or threshold.  Trim any bushes or trees on the path to your home.  Regularly check to see if handrails are loose or broken. Make sure that both sides of any steps have handrails.  Install guardrails along the edges of  any raised decks and porches.  Clear walking paths of anything that might make someone trip, such as tools or rocks.  Have any leaves, snow, or ice cleared regularly.  Use sand or salt on walking paths during winter.  Clean up any spills in your garage right away. This includes grease or oil spills. What other actions can I take?  Wear shoes that: ? Have a low heel. Do not wear high heels. ? Have rubber bottoms. ? Are comfortable and fit you well. ? Are closed at the toe. Do not wear open-toe sandals.  Use tools that help you move around (mobility aids) if they are needed. These include: ? Canes. ? Walkers. ? Scooters. ? Crutches.  Review your medicines with your doctor. Some medicines can make you feel dizzy. This can increase your chance of falling. Ask your doctor what other things you can do to help prevent falls. Where to find more information  Centers for Disease Control and Prevention, STEADI: https://garcia.biz/  [employer] on Aging: BrainJudge.co.uk Contact a doctor if:  You are afraid of falling at home.  You feel weak, drowsy, or dizzy at home.  You fall at home. Summary  There are many simple things that you can do to make your home safe and to help prevent falls.  Ways to make your home safe include removing tripping hazards and installing grab bars in the bathroom.  Ask for help when making these changes in your home. This information is not intended to replace advice given to you by your health care provider. Make sure you discuss any questions you have with your health care provider. Document Revised: 06/11/2018 Document Reviewed: 10/03/2016 Elsevier Patient Education  [place].   Peripheral Neuropathy Peripheral neuropathy is a type of nerve damage. It affects nerves that carry signals between the spinal cord and the arms, legs, and the rest of the body (peripheral nerves). It does not affect nerves in the spinal cord or  brain. In peripheral neuropathy, one nerve or a group of nerves may be damaged. Peripheral neuropathy is a broad category that includes many specific nerve disorders, like diabetic neuropathy, hereditary neuropathy, and carpal tunnel syndrome. What are the causes? This condition may be caused by:  Diabetes. This is the most common cause of peripheral neuropathy.  Nerve injury.  Pressure or stress on a nerve that lasts a long time.  Lack (deficiency) of B vitamins. This can result from alcoholism, poor diet, or a restricted  diet.  Infections.  Autoimmune diseases, such as rheumatoid arthritis and systemic lupus erythematosus.  Nerve diseases that are passed from parent to child (inherited).  Some medicines, such as cancer medicines (chemotherapy).  Poisonous (toxic) substances, such as lead and mercury.  Too little blood flowing to the legs.  Kidney disease.  Thyroid disease. In some cases, the cause of this condition is not known. What are the signs or symptoms? Symptoms of this condition depend on which of your nerves is damaged. Common symptoms include:  Loss of feeling (numbness) in the feet, hands, or both.  Tingling in the feet, hands, or both.  Burning pain.  Very sensitive skin.  Weakness.  Not being able to move a part of the body (paralysis).  Muscle twitching.  Clumsiness or poor coordination.  Loss of balance.  Not being able to control your bladder.  Feeling dizzy.  Sexual problems. How is this diagnosed? Diagnosing and finding the cause of peripheral neuropathy can be difficult. Your health care provider will take your medical history and do a physical exam. A neurological exam will also be done. This involves checking things that are affected by your brain, spinal cord, and nerves (nervous system). For example, your health care provider will check your reflexes, how you move, and what you can feel. You may have other tests, such as:  Blood  tests.  Electromyogram (EMG) and nerve conduction tests. These tests check nerve function and how well the nerves are controlling the muscles.  Imaging tests, such as CT scans or MRI to rule out other causes of your symptoms.  Removing a small piece of nerve to be examined in a lab (nerve biopsy). This is rare.  Removing and examining a small amount of the fluid that surrounds the brain and spinal cord (lumbar puncture). This is rare. How is this treated? Treatment for this condition may involve:  Treating the underlying cause of the neuropathy, such as diabetes, kidney disease, or vitamin deficiencies.  Stopping medicines that can cause neuropathy, such as chemotherapy.  Medicine to relieve pain. Medicines may include: ? Prescription or over-the-counter pain medicine. ? Antiseizure medicine. ? Antidepressants. ? Pain-relieving patches that are applied to painful areas of skin.  Surgery to relieve pressure on a nerve or to destroy a nerve that is causing pain.  Physical therapy to help improve movement and balance.  Devices to help you move around (assistive devices). Follow these instructions at home: Medicines  Take over-the-counter and prescription medicines only as told by your health care provider. Do not take any other medicines without first asking your health care provider.  Do not drive or use heavy machinery while taking prescription pain medicine. Lifestyle   Do not use any products that contain nicotine or tobacco, such as cigarettes and e-cigarettes. Smoking keeps blood from reaching damaged nerves. If you need help quitting, ask your health care provider.  Avoid or limit alcohol. Too much alcohol can cause a vitamin B deficiency, and vitamin B is needed for healthy nerves.  Eat a healthy diet. This includes: ? Eating foods that are high in fiber, such as fresh fruits and vegetables, whole grains, and beans. ? Limiting foods that are high in fat and processed  sugars, such as fried or sweet foods. General instructions   If you have diabetes, work closely with your health care provider to keep your blood sugar under control.  If you have numbness in your feet: ? Check every day for signs of injury or infection.  Watch for redness, warmth, and swelling. ? Wear padded socks and comfortable shoes. These help protect your feet.  Develop a good support system. Living with peripheral neuropathy can be stressful. Consider talking with a mental health specialist or joining a support group.  Use assistive devices and attend physical therapy as told by your health care provider. This may include using a walker or a cane.  Keep all follow-up visits as told by your health care provider. This is important. Contact a health care provider if:  You have new signs or symptoms of peripheral neuropathy.  You are struggling emotionally from dealing with peripheral neuropathy.  Your pain is not well-controlled. Get help right away if:  You have an injury or infection that is not healing normally.  You develop new weakness in an arm or leg.  You fall frequently. Summary  Peripheral neuropathy is when the nerves in the arms, or legs are damaged, resulting in numbness, weakness, or pain.  There are many causes of peripheral neuropathy, including diabetes, pinched nerves, vitamin deficiencies, autoimmune disease, and hereditary conditions.  Diagnosing and finding the cause of peripheral neuropathy can be difficult. Your health care provider will take your medical history, do a physical exam, and do tests, including blood tests and nerve function tests.  Treatment involves treating the underlying cause of the neuropathy and taking medicines to help control pain. Physical therapy and assistive devices may also help. This information is not intended to replace advice given to you by your health care provider. Make sure you discuss any questions you have with your  health care provider. Document Revised: 01/31/2017 Document Reviewed: 04/29/2016 Elsevier Patient Education  2020 [employer].

## 2019-03-30 NOTE — Progress Notes (Addendum)
PATIENT: Emily Peck DOB: 07-31-1955  REASON FOR VISIT: follow up HISTORY FROM: patient  Chief Complaint  Patient presents with  . Follow-up    NewRM.Alone. states that BL FT will no stop burning and hurting.     HISTORY OF PRESENT ILLNESS: Today 03/30/19 Emily Peck is a 64 y.o. female here today for follow up for peripheral neuropathy. She continues to have burning pain in bilateral lower extremities. She is taking gabapentin 1200mg  three times daily. She does feel that this help. Pain is better with rest, worse with activity. She denies changes in her gait. She does have a history of falls. She contributes this to vision loss. She has a prosthetic right eye and limited visual file with left eye. She is followed closely by PCP. She denies falls when she uses her cane.    HISTORY: (copied from [place] [facility]'s note on 10/15/2017)  UPDATE 8/14/2019CM Emily Peck, 64 year old female returns for follow-up with polyneuropathy in her feet for years which has progressed over time.  It is a pins-and-needles sensation.  She is currently on gabapentin with benefit.  She continues to see Dr. Deanna Artis for chronic low back pain and lumbar medial branch blocks.  She has a appointment in September.  She claims the injections last about 6 months.  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  She has seen a specialist at [facility] that has said nothing else can be done for her vision.  She was encouraged to be safe with her ambulation. She ambulates with a single-point cane.  She returns for reevaluation   UPDATE 05/04/2018CM Emily Peck, 64 year old female returns for follow-up with history of polyneuropathy in her feet for years which has progressively worsened over time. She has a pins and needles burning in the feet, used to be only on the bottom of the feet balance on the top of the feet as well.She had right knee  replacement November 2017. She is still in physical therapy. She continues to see Dr.Kirsteins  for chronic low back pain and receives lumbar medial branch blocks. She has an appointment today. She says the injections last about 6 months. She continues to have lot of left leg pain due to her knee surgery. She denies any falls. She is ambulating with single-point cane. She returns for reevaluation  HISTORY 10/19/15 Emily L McAulayis a 64 y.o.femalehere as a referral from Dr. Vanice Sarah neuropathy in the feet. PMHx of hyperlipidemia, chronic lumbar radiculopathy, polyneuropathy, obesity,thoracic compression fractures T10-T11 s/pT12-L1 fusion, depression, glaucoma, retinal detachment, macular degeneration and poor vision.. She has had neuropathy in the feet for years. Progressively worsening, continuous. Pins and needles, burning in the feet, it used to be under the bottom now it on the top also. She denies diabetes or using any medications that can cause neuropathy she has never been exposed to toxins that can cause neuropathy. Mother was diabetic and had neuropathy. Associated ith cramps in the feet. The burning is the worst. Worse at night. The gabapentin helps. She takes 600mg  4x a day. Continous all day. Unknown inciting events. No medications that can induce neuropathy. No alcohol use. She takes 600mg  neurontin 4x a day.    REVIEW OF SYSTEMS: Out of a complete 14 system review of symptoms, the patient complains only of the following symptoms, neuropathy, chronic pain, vision loss and all other reviewed systems are negative.  ALLERGIES: Allergies  Allergen Reactions  . No Known Allergies     HOME MEDICATIONS: Outpatient Medications Prior to Visit  Medication Sig Dispense Refill  . acetaminophen-codeine (TYLENOL #4) 300-60 MG tablet Take 1 tablet by mouth 4 (four) times daily as needed for moderate pain. 120 tablet 2  . Camphor-Eucalyptus-Menthol (VICKS VAPORUB EX) Apply topically. Uses on  feet prn for pain  (family dollar brand)    . escitalopram (LEXAPRO) 20 MG tablet Take 20 mg by mouth at bedtime.   0  . Liniments (BEN GAY EX) Apply 1 application topically as needed (joint pain).    . methocarbamol (ROBAXIN) 500 MG tablet Take 1 tablet (500 mg total) by mouth 3 (three) times daily. (Patient taking differently: Take 500 mg by mouth as needed. ) 45 tablet 1  . Multiple Vitamin (MULTIVITAMIN) tablet Take 1 tablet by mouth daily. OTC    . potassium chloride (KLOR-CON) 10 MEQ tablet Take 1 tablet by mouth daily.    Marland Kitchen Propylene Glycol (SYSTANE COMPLETE) 0.6 % SOLN Place 1 drop into both eyes as needed (dry eyes).    . topiramate (TOPAMAX) 200 MG tablet Take 200 mg by mouth at bedtime.   0  . traZODone (DESYREL) 150 MG tablet Take 150 mg by mouth at bedtime.  0  . warfarin (COUMADIN) 6 MG tablet Take 6-9 mg by mouth daily. Take 9mg  Monday, Wednesday, and Fridays, then all other days take 6mg s daily    . gabapentin (NEURONTIN) 600 MG tablet Take 2 tablets (1,200 mg total) by mouth 3 (three) times daily. 540 tablet 0   No facility-administered medications prior to visit.    PAST MEDICAL HISTORY: Past Medical History:  Diagnosis Date  . Anxiety   . Clotting disorder ([place])   . Depression   . Dyspnea   . GERD (gastroesophageal reflux disease)   . Headache   . Lumbosacral spondylosis without myelopathy   . Neuropathy   . Postlaminectomy syndrome, lumbar region   . Postlaminectomy syndrome, thoracic region   . Stroke [facility])     PAST SURGICAL HISTORY: Past Surgical History:  Procedure Laterality Date  . ABDOMINAL HYSTERECTOMY    . BACK SURGERY    . EYE SURGERY     eye removed right   . KNEE ARTHROSCOPY    . 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   Procedure: TOTAL KNEE ARTHROPLASTY;  Surgeon: Melrose Nakayama, MD;  Location: [place];  Service: Orthopedics;  Laterality: Right;    FAMILY HISTORY: Family History  Problem Relation Age of Onset  . Cancer Mother   . Diabetes  Mother     SOCIAL HISTORY: Social History   Socioeconomic History  . Marital status: Single    Spouse name: Not on file  . Number of children: 2  . Years of education: Not on file  . Highest education level: Not on file  Occupational History  . Not on file  Tobacco Use  . Smoking status: Never Smoker  . Smokeless tobacco: Never Used  Substance and Sexual Activity  . Alcohol use: No  . Drug use: No  . Sexual activity: Not on file  Other Topics Concern  . Not on file  Social History Narrative  . Not on file   Social Determinants of Health   Financial Resource Strain:   . Difficulty of Paying Living Expenses: Not on file  Food Insecurity:   . Worried About Charity fundraiser in the Last Year: Not on file  . Ran Out of Food  in the Last Year: Not on file  Transportation Needs:   . Lack of Transportation (Medical): Not on file  . Lack of Transportation (Non-Medical): Not on file  Physical Activity:   . Days of Exercise per Week: Not on file  . Minutes of Exercise per Session: Not on file  Stress:   . Feeling of Stress : Not on file  Social Connections:   . Frequency of Communication with Friends and Family: Not on file  . Frequency of Social Gatherings with Friends and Family: Not on file  . Attends Religious Services: Not on file  . Active Member of Clubs or Organizations: Not on file  . Attends Archivist Meetings: Not on file  . Marital Status: Not on file  Intimate Partner Violence:   . Fear of Current or Ex-Partner: Not on file  . Emotionally Abused: Not on file  . Physically Abused: Not on file  . Sexually Abused: Not on file    PHYSICAL EXAM  Vitals:   03/30/19 1458  BP: 106/64  Pulse: 70  Temp: (!) 97.5 F (36.4 C)  Weight: 205 lb (93 kg)  Height: 5\' 6"  (1.676 m)   Body mass index is 33.09 kg/m.  Generalized: Well developed, in no acute distress  Cardiology: normal rate and rhythm, no murmur noted Respiratory: clear to auscultation  bilaterally  Neurological examination  Mentation: Alert oriented to time, place, history taking. Follows all commands speech and language fluent Cranial nerve II-XII: Pupils were equal round reactive to light. Left Extraocular movements were full, right prosthesis, peripheral field loss of left eye.  Motor: The motor testing reveals 4+ over 5 strength of all 4 extremities. Good symmetric motor tone is noted throughout.  Sensory: Sensory testing is intact to soft touch on all 4 extremities. No evidence of extinction is noted. Pinprick testing intact bilaterally with exception of decreased sensation of heel, thickened skin noted of heels.  Coordination: Cerebellar testing reveals good finger-nose-finger and heel-to-shin bilaterally.  Gait and station: Gait is stable with single prong cane, slight right limp noted    DIAGNOSTIC DATA (LABS, IMAGING, TESTING) - I reviewed patient records, labs, notes, testing and imaging myself where available.  No flowsheet data found.   Lab Results  Component Value Date   WBC 7.9 02/25/2018   HGB 11.6 (L) 02/25/2018   HCT 37.0 02/25/2018   MCV 101.1 (H) 02/25/2018   PLT 213 02/25/2018      Component Value Date/Time   NA 140 02/25/2018 1329   K 3.3 (L) 02/25/2018 1329   CL 111 02/25/2018 1329   CO2 22 02/25/2018 1329   GLUCOSE 126 (H) 02/25/2018 1329   BUN 14 02/25/2018 1329   CREATININE 0.86 02/25/2018 1329   CALCIUM 8.8 (L) 02/25/2018 1329   PROT 7.2 02/25/2018 1329   PROT 7.2 10/19/2015 0811   ALBUMIN 3.4 (L) 02/25/2018 1329   AST 20 02/25/2018 1329   ALT 15 02/25/2018 1329   ALKPHOS 68 02/25/2018 1329   BILITOT 0.2 (L) 02/25/2018 1329   GFRNONAA >60 02/25/2018 1329   GFRAA >60 02/25/2018 1329   Lab Results  Component Value Date   CHOL  07/11/2009    180        ATP III CLASSIFICATION:  <200     mg/dL   Desirable  200-239  mg/dL   Borderline High  >=240    mg/dL   High          HDL 66 07/11/2009  LDLCALC (H) 07/11/2009    103         Total Cholesterol/HDL:CHD Risk Coronary Heart Disease Risk Table                     Men   Women  1/2 Average Risk   3.4   3.3  Average Risk       5.0   4.4  2 X Average Risk   9.6   7.1  3 X Average Risk  23.4   11.0        Use the calculated Patient Ratio above and the CHD Risk Table to determine the patient's CHD Risk.        ATP III CLASSIFICATION (LDL):  <100     mg/dL   Optimal  100-129  mg/dL   Near or Above                    Optimal  130-159  mg/dL   Borderline  160-189  mg/dL   High  >190     mg/dL   Very High   TRIG 53 07/11/2009   CHOLHDL 2.7 07/11/2009   Lab Results  Component Value Date   HGBA1C 5.5 10/19/2015   No results found for: Alexander PLAN 64 y.o. year old female  has a past medical history of Anxiety, Clotting disorder ([place]), Depression, Dyspnea, GERD (gastroesophageal reflux disease), Headache, Lumbosacral spondylosis without myelopathy, Neuropathy, Postlaminectomy syndrome, lumbar region, Postlaminectomy syndrome, thoracic region, and Stroke ([place]). here with     ICD-10-CM   1. Peripheral polyneuropathy  G62.9     Emily Peck continues to have significant burning pain of bilateral feet. She does not feel symptoms are any worse and are improved with gabapentin. She is currently at max dose of gabapentin. No obvious adverse effects noted but we have reviewed potential for increased dizziness and sedation. She continues chronic pain medications as well and we will monitor closely. She is followed closely by PCP for falls. No injuries. She feels that vision loss is the main factor is falls and does not feel this is related to sensation in her feet. She does not have numbness but more burning. Sensation intact today. We will try a topical neuropathy compound to assist with pain. She will use as directed. She will call for any worsening and follow up with me in 1 year. She verbalizes understanding and agreement with this plan.    No orders of  the defined types were placed in this encounter.    Meds ordered this encounter  Medications  . gabapentin (NEURONTIN) 600 MG tablet    Sig: Take 2 tablets (1,200 mg total) by mouth 3 (three) times daily.    Dispense:  540 tablet    Refill:  3    Order Specific Question:   Supervising Provider    Answer:   Melvenia Beam [id number]      I spent 20 minutes with the patient. 50% of this time was spent counseling and educating patient on plan of care and medications.    Debbora Presto, FNP-C 03/30/2019, 4:25 PM [facility] [address], [place], [place] [address] [phone]  Made any corrections needed, and agree with history, physical, neuro exam,assessment and plan as stated.     Sarina Ill, MD [facility]

## 2019-03-30 NOTE — Progress Notes (Signed)
Primary Physician:  Suella Broad, FNP   Patient ID: Emily Peck, female    DOB: 22-Nov-1955, 64 y.o.   MRN: [id number]  Subjective:    Chief Complaint  Patient presents with  . Chest Pain    Cerebral venous sinus thrombosis  . Shortness of Breath  . Follow-up    HPI: Emily Peck  is a 64 y.o. 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   She was last seen by [place] in January 2020 for chest pain after she went to the emergency room for symptoms of left arm pain, feeling numbness in the left arm and also precordial region. Symptoms were felt to be atypical. She was lost to follow up, but did recently have her stress test and now presents to discuss results.  Her main complaint is shortness of breath. States that she cant do anything without shortness of breath. It has worsened over the last few months. Weight has overall been stable. She does not sleep flat due to shortness of breath that is not new for her. She does continue to have occasional, mild chest pain. Describes as sharp. Can happen at rest. Last for a few minutes and resolves on its own.   She also mention easily falling asleep at work. States that she does not sleep well at night and has been told by her granddaughter that she snores.  No history of hypertension or hyperlipidemia.      Past Medical History:  Diagnosis Date  . Anxiety   . Clotting disorder ([place])   . Depression   . Dyspnea   . GERD (gastroesophageal reflux disease)   . Headache   . Lumbosacral spondylosis without myelopathy   . Neuropathy   . Postlaminectomy syndrome, lumbar region   . Postlaminectomy syndrome, thoracic region   . Stroke [facility])     Past Surgical History:  Procedure Laterality Date  .  ABDOMINAL HYSTERECTOMY    . BACK SURGERY    . EYE SURGERY     eye removed right   . KNEE ARTHROSCOPY    . 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   Procedure: TOTAL KNEE ARTHROPLASTY;  Surgeon: Melrose Nakayama, MD;  Location: [place];  Service: Orthopedics;  Laterality: Right;    Social History   Socioeconomic History  . Marital status: Single    Spouse name: Not on file  . Number of children: 2  . Years of education: Not on file  . Highest education level: Not on file  Occupational History  . Not on file  Tobacco Use  . Smoking status: Never Smoker  . Smokeless tobacco: Never Used  Substance and Sexual Activity  . Alcohol use: No  . Drug use: No  . Sexual activity: Not on file  Other Topics Concern  . Not on file  Social History Narrative  . Not on file   Social Determinants of Health   Financial Resource Strain:   . Difficulty of Paying Living Expenses: Not on file  Food Insecurity:   . Worried About Charity fundraiser in the Last Year: Not on file  . Ran Out of Food in the Last Year: Not on file  Transportation Needs:   . Lack of Transportation (Medical): Not on file  .  Lack of Transportation (Non-Medical): Not on file  Physical Activity:   . Days of Exercise per Week: Not on file  . Minutes of Exercise per Session: Not on file  Stress:   . Feeling of Stress : Not on file  Social Connections:   . Frequency of Communication with Friends and Family: Not on file  . Frequency of Social Gatherings with Friends and Family: Not on file  . Attends Religious Services: Not on file  . Active Member of Clubs or Organizations: Not on file  . Attends Archivist Meetings: Not on file  . Marital Status: Not on file  Intimate Partner Violence:   . Fear of Current or Ex-Partner: Not on file  . Emotionally Abused: Not on file  . Physically Abused: Not on file  . Sexually Abused: Not on file    Review of Systems  Constitution: Positive for malaise/fatigue. Negative  for decreased appetite, weight gain and weight loss.  Eyes: Negative for visual disturbance.  Cardiovascular: Positive for chest pain and dyspnea on exertion. Negative for claudication, leg swelling, orthopnea, palpitations and syncope.  Respiratory: Positive for snoring. Negative for hemoptysis and wheezing.   Endocrine: Negative for cold intolerance and heat intolerance.  Hematologic/Lymphatic: Does not bruise/bleed easily.  Skin: Negative for nail changes.  Musculoskeletal: Negative for muscle weakness and myalgias.  Gastrointestinal: Negative for abdominal pain, change in bowel habit, nausea and vomiting.  Neurological: Negative for difficulty with concentration, dizziness, focal weakness and headaches.  Psychiatric/Behavioral: Negative for altered mental status and suicidal ideas.  All other systems reviewed and are negative.     Objective:  Blood pressure 118/69, pulse 66, temperature 98.1 F (36.7 C), height 5\' 6"  (1.676 m), weight 203 lb 1.6 oz (92.1 kg), SpO2 97 %. Body mass index is 32.78 kg/m.    Physical Exam  Constitutional: She is oriented to person, place, and time. Vital signs are normal. She appears well-developed and well-nourished.  HENT:  Head: Normocephalic and atraumatic.  Cardiovascular: Normal rate, regular rhythm, normal heart sounds and intact distal pulses.  Pulmonary/Chest: Effort normal and breath sounds normal. No accessory muscle usage. No respiratory distress.  Abdominal: Soft. Bowel sounds are normal.  Musculoskeletal:        General: Normal range of motion.     Cervical back: Normal range of motion.  Neurological: She is alert and oriented to person, place, and time.  Skin: Skin is warm and dry.  Vitals reviewed.  Radiology: No results found.  Laboratory examination:    CMP Latest Ref Rng & Units 02/25/2018 01/09/2016 12/29/2015  Glucose 70 - 99 mg/dL 126(H) - 103(H)  BUN 8 - 23 mg/dL 14 - 7  Creatinine 0.44 - 1.00 mg/dL 0.86 0.76 0.81    Sodium 135 - 145 mmol/L 140 - 138  Potassium 3.5 - 5.1 mmol/L 3.3(L) - 3.3(L)  Chloride 98 - 111 mmol/L 111 - 109  CO2 22 - 32 mmol/L 22 - 22  Calcium 8.9 - 10.3 mg/dL 8.8(L) - 9.5  Total Protein 6.5 - 8.1 g/dL 7.2 - -  Total Bilirubin 0.3 - 1.2 mg/dL 0.2(L) - -  Alkaline Phos 38 - 126 U/L 68 - -  AST 15 - 41 U/L 20 - -  ALT 0 - 44 U/L 15 - -   CBC Latest Ref Rng & Units 02/25/2018 01/09/2016 12/29/2015  WBC 4.0 - 10.5 K/uL 7.9 11.9(H) 8.2  Hemoglobin 12.0 - 15.0 g/dL 11.6(L) 12.3 13.7  Hematocrit 36.0 - 46.0 % 37.0  37.3 41.5  Platelets 150 - 400 K/uL 213 281 253   Lipid Panel     Component Value Date/Time   CHOL  07/11/2009 0500    180        ATP III CLASSIFICATION:  <200     mg/dL   Desirable  200-239  mg/dL   Borderline High  >=240    mg/dL   High          TRIG 53 07/11/2009 0500   HDL 66 07/11/2009 0500   CHOLHDL 2.7 07/11/2009 0500   VLDL 11 07/11/2009 0500   LDLCALC (H) 07/11/2009 0500    103        Total Cholesterol/HDL:CHD Risk Coronary Heart Disease Risk Table                     Men   Women  1/2 Average Risk   3.4   3.3  Average Risk       5.0   4.4  2 X Average Risk   9.6   7.1  3 X Average Risk  23.4   11.0        Use the calculated Patient Ratio above and the CHD Risk Table to determine the patient's CHD Risk.        ATP III CLASSIFICATION (LDL):  <100     mg/dL   Optimal  100-129  mg/dL   Near or Above                    Optimal  130-159  mg/dL   Borderline  160-189  mg/dL   High  >190     mg/dL   Very High   HEMOGLOBIN A1C  TSH No results for input(s): TSH in the last 8760 hours.  PRN Meds:. There are no discontinued medications. Current Meds  Medication Sig  . acetaminophen-codeine (TYLENOL #4) 300-60 MG tablet Take 1 tablet by mouth 4 (four) times daily as needed for moderate pain.  . Camphor-Eucalyptus-Menthol (VICKS VAPORUB EX) Apply topically. Uses on feet prn for pain  (family dollar brand)  . escitalopram (LEXAPRO) 20 MG tablet  Take 20 mg by mouth at bedtime.   . gabapentin (NEURONTIN) 600 MG tablet Take 2 tablets (1,200 mg total) by mouth 3 (three) times daily.  . Liniments (BEN GAY EX) Apply 1 application topically as needed (joint pain).  . methocarbamol (ROBAXIN) 500 MG tablet Take 1 tablet (500 mg total) by mouth 3 (three) times daily. (Patient taking differently: Take 500 mg by mouth as needed. )  . potassium chloride (KLOR-CON) 10 MEQ tablet Take 1 tablet by mouth daily.  Marland Kitchen Propylene Glycol (SYSTANE COMPLETE) 0.6 % SOLN Place 1 drop into both eyes as needed (dry eyes).  . topiramate (TOPAMAX) 200 MG tablet Take 200 mg by mouth at bedtime.   . traZODone (DESYREL) 150 MG tablet Take 150 mg by mouth at bedtime.  Marland Kitchen warfarin (COUMADIN) 6 MG tablet Take 6-9 mg by mouth daily. Take 9mg  Monday, Wednesday, and Fridays, then all other days take 6mg s daily    Cardiac Studies:   Echocardiogram 04/02/2018: Left ventricle cavity is normal in size. Normal left ventricular shape. Normal global wall motion. Normal diastolic filling pattern. Calculated EF 55%. Left atrial cavity is mildly dilated. Moderate (Grade II) aortic regurgitation. Mild (Grade I) mitral regurgitation. Moderate tricuspid regurgitation. Estimated pulmonary artery systolic pressure 30 mmHg. No significant change compared to previous study in 2017.  Lexiscan Tetrofosmin Stress Test  03/17/2019: Nondiagnostic ECG stress. Normal myocardial perfusion. Stress LV EF: 67%.  No previous exam available for comparison. Low risk study.   Assessment:   Dyspnea on exertion - Plan: PCV ECHOCARDIOGRAM COMPLETE  Atypical chest pain - Plan: EKG 12-Lead  Fatigue, unspecified type - Plan: Ambulatory referral to Neurology  History of CVA (cerebrovascular accident)  Obesity (BMI 30-39.9)  EKG 03/29/2018: Normal sinus rhythm at 63 bpm, normal axis, PRWP cannot exclude anterior infarct old. No evidence of ischemia.   Recommendations:   Patient made an  appointment to see me today due to worsening dyspnea on exertion for the last few months and continued chest pain, that is likely suggestive of noncardiac etiology.  I discussed her recent stress test results, low risk study.  She has previously had echocardiogram in January 2020 that showed mild pulmonary hypertension and moderate aortic regurgitation.  No clinical evidence of decompensated heart failure.  Given her complaints of worsening dyspnea on exertion, will repeat her echocardiogram per reevaluation of PA pressures and aortic regurgitation.  If echocardiogram is unyielding for her symptoms, question if pulmonary etiology is contributing as well as potentially some deconditioning.  She does mention fatigue and easily falls asleep while sitting at work.  States she has been told by her granddaughter to loudly snore.  Would recommend evaluation for obstructive sleep apnea and will make referral to Dr. Brett Fairy for further evaluation.  Blood pressure is well controlled.  She states her lipids are also stable.  She is on Coumadin therapy that is being managed by her PCP.  Denies any bleeding.  I would recommend working on weight loss to help her reduce her risk.  I will plan to see her back after her echocardiogram in the next few weeks for follow-up on dyspnea on exertion.  Miquel Dunn, MSN, APRN, FNP-C [facility]. [place] Office: [phone] Fax: [phone]

## 2019-03-30 NOTE — Telephone Encounter (Signed)
Paperwork has been filled out signed by NP. Paperwork has been faxed to [employer]. Confirmation fax has been received.

## 2019-03-31 ENCOUNTER — Ambulatory Visit: Payer: Self-pay

## 2019-04-09 ENCOUNTER — Ambulatory Visit: Payer: Medicare Other | Attending: Internal Medicine

## 2019-04-09 DIAGNOSIS — Z23 Encounter for immunization: Secondary | ICD-10-CM

## 2019-04-09 NOTE — Progress Notes (Signed)
   Covid-19 Vaccination Clinic  Name:  Emily Peck    MRN: [id number] DOB: 12/31/1955  04/09/2019  Emily Peck was observed post Covid-19 immunization for 15 minutes without incidence. She was provided with Vaccine Information Sheet and instruction to access the V-Safe system.   Emily Peck was instructed to call 911 with any severe reactions post vaccine: Marland Kitchen Difficulty breathing  . Swelling of your face and throat  . A fast heartbeat  . A bad rash all over your body  . Dizziness and weakness    Immunizations Administered    Name Date Dose VIS Date Route   Pfizer COVID-19 Vaccine 04/09/2019 12:54 PM 0.3 mL 02/12/2019 Intramuscular   Manufacturer: [employer]   Lot: [id number]   [place]: [id number]

## 2019-04-13 ENCOUNTER — Other Ambulatory Visit: Payer: Medicare Other

## 2019-04-14 ENCOUNTER — Ambulatory Visit
Admission: RE | Admit: 2019-04-14 | Discharge: 2019-04-14 | Disposition: A | Discharge status: Home / Self Care | Payer: Medicare Other | Source: Ambulatory Visit | Attending: Family | Admitting: Family

## 2019-04-14 ENCOUNTER — Other Ambulatory Visit: Payer: Self-pay

## 2019-04-14 DIAGNOSIS — Z1231 Encounter for screening mammogram for malignant neoplasm of breast: Secondary | ICD-10-CM

## 2019-04-17 ENCOUNTER — Ambulatory Visit: Payer: Self-pay

## 2019-04-19 ENCOUNTER — Ambulatory Visit: Payer: Medicare Other

## 2019-04-19 ENCOUNTER — Ambulatory Visit (INDEPENDENT_AMBULATORY_CARE_PROVIDER_SITE_OTHER): Payer: Medicare Other | Admitting: Neurology

## 2019-04-19 ENCOUNTER — Other Ambulatory Visit: Payer: Self-pay

## 2019-04-19 ENCOUNTER — Encounter: Payer: Self-pay | Admitting: Neurology

## 2019-04-19 VITALS — BP 118/72 | HR 55 | Temp 97.3°F | Ht 66.0 in | Wt 203.0 lb

## 2019-04-19 DIAGNOSIS — R519 Headache, unspecified: Secondary | ICD-10-CM | POA: Insufficient documentation

## 2019-04-19 DIAGNOSIS — R351 Nocturia: Secondary | ICD-10-CM | POA: Diagnosis not present

## 2019-04-19 DIAGNOSIS — G610 Guillain-Barre syndrome: Secondary | ICD-10-CM | POA: Insufficient documentation

## 2019-04-19 DIAGNOSIS — E74819 Disorders of glucose transport, unspecified: Secondary | ICD-10-CM

## 2019-04-19 DIAGNOSIS — H5442A4 Blindness left eye category 4, normal vision right eye: Secondary | ICD-10-CM

## 2019-04-19 DIAGNOSIS — J342 Deviated nasal septum: Secondary | ICD-10-CM

## 2019-04-19 DIAGNOSIS — G4719 Other hypersomnia: Secondary | ICD-10-CM | POA: Diagnosis not present

## 2019-04-19 DIAGNOSIS — R0609 Other forms of dyspnea: Secondary | ICD-10-CM

## 2019-04-19 DIAGNOSIS — M2619 Other specified anomalies of jaw-cranial base relationship: Secondary | ICD-10-CM | POA: Diagnosis not present

## 2019-04-19 DIAGNOSIS — G629 Polyneuropathy, unspecified: Secondary | ICD-10-CM | POA: Diagnosis not present

## 2019-04-19 DIAGNOSIS — G08 Intracranial and intraspinal phlebitis and thrombophlebitis: Secondary | ICD-10-CM

## 2019-04-19 NOTE — Progress Notes (Signed)
SLEEP MEDICINE CLINIC    Provider:  Larey Seat, MD  Primary Care Physician:  Suella Broad, FNP [address] [place] [id number]     Referring Provider: Dr. Irven Shelling and Dr. Bonney Roussel office.          Chief Complaint according to patient   Patient presents with:    . New Patient (Initial Visit)           HISTORY OF PRESENT ILLNESS:  Emily Peck is a 64 y.o.African American right handed  female patient and seen upon a referral on 04/19/2019 from [facility].  The referral was sent by Dallie Piles nurse practitioner in cardiology.  The patient was seen at the office on 30 March 2019 is dyspnea on exertion chest pain, and a follow-up.  The patient has a coagulopathy, also suffers from gastroesophageal reflux disease, depression, chronic headaches, lower back pain, has been eva;uated by Dr Jaynee Eagles for neuropathy, postlaminectomy symptoms in the lumbar and thoracic region, there is also a stroke mention but not sure that qualified.  She has been on warfarin after a stroke that happened in 2004 she also was diagnosed with mild pulmonary hypertension by an echocardiogram in 2017.  Very important is that the patient is legally blind;  she has a prosthetic right eye and very small field of vision remaining in the left.  She is basically just able to see in the central visual field , able to perceive silhouettes and movement , and light and dark. Chief concern according to patient : ' I toss and turn" and "I  fell asleep at work, and this often !".  The patient never had asleep study.    Sleep relevant medical history: Nocturia up to 3-4 times, I limit my fluid intake , no Tonsillectomy, history cervical spine surgery after a slipped disc, " My fusion didn't heal, non-fusion, needed rods.  I have frequent sinusitis and can't breath through my nose"   Family medical /sleep history: No  other family member with OSA.    Social history: Patient  moved from [place],  is working for the [employer] in [place], and lives in a household with her son ( 85)  Family status is divorced and her former husband died- she considered herself widowed ,  with 2 adult sons.  The patient currently works. Pets are present, one dog-. Tobacco use: never. ETOH use; seldomly,  Caffeine intake in form of Coffee( 1 cup in AM ) Soda( 2-3 a day) Tea ( brews it at home- in AM ). No use of energy drinks. Regular exercise;" I can't do any"  Hobbies :none since her vision worsened.    Sleep habits are as follows: The patient's dinner time is between 6 PM. The patient goes to bed at 8.30 PM and continues to sleep for 7 hours, wakes for several  bathroom breaks, the first time at midnight.   The preferred sleep position is on her side with the support of 2 pillows- helps with GERD and post nasal drip. Has woken with palpitation.  Dreams are reportedly rare.  5.30  AM is the usual rise time. The patient wakes up with an alarm.  She reports not feeling refreshed or restored in AM, with symptoms such as dry mouth, morning headaches, sinus congestion, and residual fatigue. Naps are taken infrequently, usually on the week ends before and after lunch - naps  lasting from 20-45 minutes and are more refreshing  than nocturnal sleep.    Review of Systems: Out of a complete 14 system review, the patient complains of only the following symptoms, and all other reviewed systems are negative.:  Fatigue, sleepiness , snoring, fragmented sleep by nocturia, blind- has daytime headaches, sinus pressure. Post nasal drip.   She can perceive enough light to know when it's day and when it's night.    How likely are you to doze in the following situations: 0 = not likely, 1 = slight chance, 2 = moderate chance, 3 = high chance   Sitting and Reading? Watching Television? Sitting inactive in a public place (theater or meeting)? As a passenger in a car for an hour without a  break? Lying down in the afternoon when circumstances permit? Sitting and talking to someone? Sitting quietly after lunch without alcohol? In a car, while stopped for a few minutes in traffic?   Total = 17/ 24 points   FSS endorsed at 60/ 63 points.   Social History   Socioeconomic History  . Marital status: Single    Spouse name: Not on file  . Number of children: 2  . Years of education: Not on file  . Highest education level: Not on file  Occupational History  . Not on file  Tobacco Use  . Smoking status: Never Smoker  . Smokeless tobacco: Never Used  Substance and Sexual Activity  . Alcohol use: No  . Drug use: No  . Sexual activity: Not on file  Other Topics Concern  . Not on file  Social History Narrative  . Not on file   Social Determinants of Health   Financial Resource Strain:   . Difficulty of Paying Living Expenses: Not on file  Food Insecurity:   . Worried About Charity fundraiser in the Last Year: Not on file  . Ran Out of Food in the Last Year: Not on file  Transportation Needs:   . Lack of Transportation (Medical): Not on file  . Lack of Transportation (Non-Medical): Not on file  Physical Activity:   . Days of Exercise per Week: Not on file  . Minutes of Exercise per Session: Not on file  Stress:   . Feeling of Stress : Not on file  Social Connections:   . Frequency of Communication with Friends and Family: Not on file  . Frequency of Social Gatherings with Friends and Family: Not on file  . Attends Religious Services: Not on file  . Active Member of Clubs or Organizations: Not on file  . Attends Archivist Meetings: Not on file  . Marital Status: Not on file    Family History  Problem Relation Age of Onset  . Cancer Mother   . Diabetes Mother     Past Medical History:  Diagnosis Date  . Anxiety   . Clotting disorder ([place])   . Depression   . Dyspnea   . GERD (gastroesophageal reflux disease)   . Headache   . Lumbosacral  spondylosis without myelopathy   . Neuropathy   . Postlaminectomy syndrome, lumbar region   . Postlaminectomy syndrome, thoracic region   . Stroke [facility])     Past Surgical History:  Procedure Laterality Date  . ABDOMINAL HYSTERECTOMY    . BACK SURGERY    . EYE SURGERY     eye removed right   . KNEE ARTHROSCOPY    . 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   Procedure: TOTAL KNEE ARTHROPLASTY;  Surgeon: Melrose Nakayama, MD;  Location: [facility];  Service: Orthopedics;  Laterality: Right;     Current Outpatient Medications on File Prior to Visit  Medication Sig Dispense Refill  . acetaminophen-codeine (TYLENOL #4) 300-60 MG tablet Take 1 tablet by mouth 4 (four) times daily as needed for moderate pain. 120 tablet 2  . Ascorbic Acid (VITAMIN C PO) Take 1 tablet by mouth daily.    . B Complex Vitamins (VITAMIN B COMPLEX PO) Take 1 tablet by mouth daily.    . Camphor-Eucalyptus-Menthol (VICKS VAPORUB EX) Apply topically. Uses on feet prn for pain  (family dollar brand)    . escitalopram (LEXAPRO) 20 MG tablet Take 20 mg by mouth at bedtime.   0  . gabapentin (NEURONTIN) 600 MG tablet Take 2 tablets (1,200 mg total) by mouth 3 (three) times daily. 540 tablet 3  . Liniments (BEN GAY EX) Apply 1 application topically as needed (joint pain).    . methocarbamol (ROBAXIN) 500 MG tablet Take 1 tablet (500 mg total) by mouth 3 (three) times daily. (Patient taking differently: Take 500 mg by mouth as needed. ) 45 tablet 1  . Multiple Vitamin (MULTIVITAMIN) tablet Take 1 tablet by mouth daily. OTC    . potassium chloride (KLOR-CON) 10 MEQ tablet Take 1 tablet by mouth daily.    Marland Kitchen Propylene Glycol (SYSTANE COMPLETE) 0.6 % SOLN Place 1 drop into both eyes as needed (dry eyes).    . topiramate (TOPAMAX) 200 MG tablet Take 200 mg by mouth at bedtime.   0  . traZODone (DESYREL) 150 MG tablet Take 150 mg by mouth at bedtime.  0  . VITAMIN D PO Take 1 tablet by mouth daily.    Marland Kitchen warfarin (COUMADIN) 6 MG tablet  Take 6-9 mg by mouth daily. Take 9mg  Monday, Wednesday, and Fridays, then all other days take 6mg s daily     No current facility-administered medications on file prior to visit.    Allergies  Allergen Reactions  . No Known Allergies     Physical exam:  Today's Vitals   04/19/19 1006  BP: 118/72  Pulse: (!) 55  Temp: (!) 97.3 F (36.3 C)  Weight: 203 lb (92.1 kg)  Height: 5\' 6"  (1.676 m)   Body mass index is 32.77 kg/m.   Wt Readings from Last 3 Encounters:  04/19/19 203 lb (92.1 kg)  03/30/19 205 lb (93 kg)  03/30/19 203 lb 1.6 oz (92.1 kg)     Ht Readings from Last 3 Encounters:  04/19/19 5\' 6"  (1.676 m)  03/30/19 5\' 6"  (1.676 m)  03/30/19 5\' 6"  (1.676 m)      General: The patient is awake, alert and appears not in acute distress. The patient is well groomed. Head: Normocephalic, atraumatic. Neck is supple. Mallampati 4,  neck circumference:15 inches . Nasal airflow congested , deviated septum veers to the right  .  Retrognathia is present, mild .   Dental status: dentures,full top plate.  Cardiovascular:  Regular rate and cardiac rhythm by pulse,  without distended neck veins. Respiratory: Lungs are clear to auscultation.  Skin:  Without evidence of ankle edema, or rash. Trunk: The patient's posture is erect.   Neurologic exam : The patient is awake and alert, oriented to place and time.   Memory subjective described as intact.  Attention span & concentration ability appears normal.  Speech is fluent,  without  dysarthria, mild dysphonia and no  aphasia.  Mood and affect are appropriate.   Cranial nerves: no loss of smell  or taste reported   Funduscopic exam deferred. The right eye is prosthetic.   Visual field on the left  by finger perimetry is very restricted - pinpoint vision. Macular degenaraton.  Detached retina.  Glaucoma .  Hearing was intact to soft voice and finger rubbing.    Facial sensation intact to fine touch.  Facial motor strength is  symmetric and tongue and uvula move midline.  Neck ROM : rotation, tilt and flexion extension were normal for age and shoulder shrug was symmetrical.    Motor exam:  Symmetric bulk, tone and ROM.   Normal tone without cog wheeling, symmetric grip strength .   Sensory:  Fine touch, pinprick and vibration were tested  and vibration is very much reduced in both ankles , both lower extremities are described  tingling, numb. Proprioception tested in the upper extremities was normal.   Coordination: Rapid alternating movements in the fingers/hands were of normal speed.  The Finger-to-nose maneuver was intact without evidence of ataxia, dysmetria or tremor.   Gait and station: Patient could rise unassisted from a seated position, walked without assistive device- she is using a cane for the blind, not for gait support  Stance is of normal width/ base and the patient turned with 4 steps.  Toe and heel walk were deferred.  Deep tendon reflexes: in the upper and lower extremities are symmetric and intact.  Babinski response was deferred.    I am dictating the patient underwent laboratory testing on 02-25-2018 at Dr. Irven Shelling office and her fasting glucose was 126 potassium only 3.3 and calcium 8.8.  She had no elevated liver function tests hemoglobin was 11.6 borderline low, she had normal lipids hemoglobin A1c was not quoted, TSH was not checked at the time.    These tests have been later done here by Dr. Jaynee Eagles.  She had an echocardiogram on the January 30 of 2020 ejection fraction was 55% global normal wall motion.  Pulmonary artery systolic pressure 30 mmHg, she had a Lexiscan stress test March 17, 2019 normal myocardial perfusion ejection fraction of 67% low risk.  EKG normal sinus rhythm at 63 bpm.       After spending a total time of  45 minutes face to face and additional time for physical and neurologic examination, review of laboratory studies,  personal review of imaging studies, reports  and results of other testing and review of referral information / records as far as provided in visit, I have established the following assessments:  1)  Severe daytime sleepiness in a patient who is legally blind, but maintains a good day and night rhythm.   2)  Nocturia fragments her sleep , she wakes unrefreshed in AM, with headaches and with dry mouth. Nasal septal deviation, sinusitis and GERD contributing to less restful sleep  3)  Daytime naps are refreshing.  No vivid dreams, no cataplexy, no sleep paralysis.    My Plan is to proceed with:  1) attended sleep study , check for apnea, snoring and any PLM- please use a seizure montage or PLM montage  2) Patient will need some planning time to have son bring her to lab and pick her up in AM . She would prefer the 8 PM arrival time and a female tech.    I would like to thank  Dr Einar Gip and Dr Jaynee Eagles  for allowing me to meet with and to take care of this pleasant patient.   In short, Emily Peck is presenting with EDS  and nocturia, as well as trouble to breath at night. I plan to follow up either personally or through our NP within 2-3 month.   CC: I will share my notes with  PCP.  Electronically signed by: Larey Seat, MD 04/19/2019 10:09 AM  [facility] Board certified by The [employer] of Sleep Medicine and Diplomate of the [employer] of Sleep Medicine. Board certified In Neurology through the [place], Fellow of the [employer] of Neurology. Medical Director of [facility].

## 2019-04-21 DIAGNOSIS — M25571 Pain in right ankle and joints of right foot: Secondary | ICD-10-CM | POA: Diagnosis not present

## 2019-04-21 DIAGNOSIS — M65871 Other synovitis and tenosynovitis, right ankle and foot: Secondary | ICD-10-CM | POA: Diagnosis not present

## 2019-04-21 DIAGNOSIS — M65872 Other synovitis and tenosynovitis, left ankle and foot: Secondary | ICD-10-CM | POA: Diagnosis not present

## 2019-04-21 DIAGNOSIS — M25572 Pain in left ankle and joints of left foot: Secondary | ICD-10-CM | POA: Diagnosis not present

## 2019-04-21 LAB — PROTEIN ELECTROPHORESIS, SERUM
A/G Ratio: 1.1 (ref 0.7–1.7)
Albumin ELP: 3.6 g/dL (ref 2.9–4.4)
Alpha 1: 0.2 g/dL (ref 0.0–0.4)
Alpha 2: 0.7 g/dL (ref 0.4–1.0)
Beta: 1.3 g/dL (ref 0.7–1.3)
Gamma Globulin: 1 g/dL (ref 0.4–1.8)
Globulin, Total: 3.3 g/dL (ref 2.2–3.9)
Total Protein: 6.9 g/dL (ref 6.0–8.5)

## 2019-04-21 LAB — TSH+FREE T4
Free T4: 0.74 ng/dL — ABNORMAL LOW (ref 0.82–1.77)
TSH: 0.033 u[IU]/mL — ABNORMAL LOW (ref 0.450–4.500)

## 2019-04-21 LAB — HEMOGLOBIN A1C
Est. average glucose Bld gHb Est-mCnc: 108 mg/dL
Hgb A1c MFr Bld: 5.4 % (ref 4.8–5.6)

## 2019-04-22 ENCOUNTER — Encounter: Payer: Self-pay | Admitting: Neurology

## 2019-04-22 DIAGNOSIS — Z952 Presence of prosthetic heart valve: Secondary | ICD-10-CM | POA: Diagnosis not present

## 2019-04-22 DIAGNOSIS — Z7901 Long term (current) use of anticoagulants: Secondary | ICD-10-CM | POA: Diagnosis not present

## 2019-04-26 ENCOUNTER — Telehealth: Payer: Self-pay | Admitting: Neurology

## 2019-04-26 NOTE — Telephone Encounter (Signed)
-----   Message from Larey Seat, MD sent at 04/21/2019  4:44 PM EST ----- Not diabetic according to HbA1c , normal serum  protein electrophoresis , but abnormal THYROID function which can affect neuropathy, cold tolerance, and metabolism.

## 2019-04-26 NOTE — Telephone Encounter (Signed)
Called the patient and advised her of the lab result finding. Encouraged the patient to make sure to follow up with her PCP in regards to thyroid levels, that way they can monitor that. Pt verbalized understanding. Pt had no questions at this time but was encouraged to call back if questions arise.

## 2019-04-27 ENCOUNTER — Ambulatory Visit: Payer: Medicare Other | Admitting: Cardiology

## 2019-04-27 ENCOUNTER — Other Ambulatory Visit: Payer: Self-pay

## 2019-04-27 ENCOUNTER — Encounter: Payer: Self-pay | Admitting: Cardiology

## 2019-04-27 VITALS — BP 141/71 | HR 57 | Temp 97.6°F | Resp 16 | Ht 66.0 in | Wt 207.8 lb

## 2019-04-27 DIAGNOSIS — R5383 Other fatigue: Secondary | ICD-10-CM | POA: Diagnosis not present

## 2019-04-27 DIAGNOSIS — R0609 Other forms of dyspnea: Secondary | ICD-10-CM

## 2019-04-27 DIAGNOSIS — I351 Nonrheumatic aortic (valve) insufficiency: Secondary | ICD-10-CM

## 2019-04-27 DIAGNOSIS — E669 Obesity, unspecified: Secondary | ICD-10-CM | POA: Diagnosis not present

## 2019-04-27 NOTE — Progress Notes (Signed)
Primary Physician:  Suella Broad, FNP   Patient ID: Emily Peck, female    DOB: Nov 13, 1955, 64 y.o.   MRN: [id number]  Subjective:    Chief Complaint  Patient presents with  . Shortness of Breath  . Results    echo    HPI: Emily Peck  is a 64 y.o. 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   She was recently seen for dyspnea on exertion. She has had low risk nuclear stress test in Jan 2021. She recently underwent echocardiogram and now presents for follow up.  She continues to have dyspnea on exertion that is unchanged compared to her last office visit.  Has not had any chest pain.  She has chronic leg edema that has been stable.  Her activity is limited due to chronic back pain and neuropathy.  During the interim, she has been evaluated by Dr. Brett Fairy for possible sleep apnea.  She is planning to undergo sleep study in the next few weeks. She has snoring and daytime tiredness.   No history of hypertension or hyperlipidemia.      Past Medical History:  Diagnosis Date  . Anxiety   . Clotting disorder ([place])   . Depression   . Dyspnea   . GERD (gastroesophageal reflux disease)   . Headache   . Lumbosacral spondylosis without myelopathy   . Neuropathy   . Postlaminectomy syndrome, lumbar region   . Postlaminectomy syndrome, thoracic region   . Stroke [facility])     Past Surgical History:  Procedure Laterality Date  . ABDOMINAL HYSTERECTOMY    . BACK SURGERY    . EYE SURGERY     eye removed right   . KNEE ARTHROSCOPY    . 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   Procedure: TOTAL KNEE ARTHROPLASTY;  Surgeon: Melrose Nakayama, MD;  Location: [place];  Service: Orthopedics;  Laterality: Right;    Social History   Socioeconomic  History  . Marital status: Single    Spouse name: Not on file  . Number of children: 2  . Years of education: Not on file  . Highest education level: Not on file  Occupational History  . Not on file  Tobacco Use  . Smoking status: Never Smoker  . Smokeless tobacco: Never Used  Substance and Sexual Activity  . Alcohol use: No  . Drug use: No  . Sexual activity: Not on file  Other Topics Concern  . Not on file  Social History Narrative  . Not on file   Social Determinants of Health   Financial Resource Strain:   . Difficulty of Paying Living Expenses: Not on file  Food Insecurity:   . Worried About Charity fundraiser in the Last Year: Not on file  . Ran Out of Food in the Last Year: Not on file  Transportation Needs:   . Lack of Transportation (Medical): Not on file  . Lack of Transportation (Non-Medical): Not on file  Physical Activity:   . Days of Exercise per Week: Not on file  . Minutes of Exercise per Session: Not on file  Stress:   . Feeling of Stress : Not on file  Social Connections:   . Frequency of Communication with Friends and Family: Not on file  .  Frequency of Social Gatherings with Friends and Family: Not on file  . Attends Religious Services: Not on file  . Active Member of Clubs or Organizations: Not on file  . Attends Archivist Meetings: Not on file  . Marital Status: Not on file  Intimate Partner Violence:   . Fear of Current or Ex-Partner: Not on file  . Emotionally Abused: Not on file  . Physically Abused: Not on file  . Sexually Abused: Not on file    Review of Systems  Constitution: Positive for malaise/fatigue. Negative for decreased appetite, weight gain and weight loss.  Eyes: Negative for visual disturbance.  Cardiovascular: Positive for dyspnea on exertion. Negative for chest pain, claudication, leg swelling, orthopnea, palpitations and syncope.  Respiratory: Positive for snoring. Negative for hemoptysis and wheezing.     Endocrine: Negative for cold intolerance and heat intolerance.  Hematologic/Lymphatic: Does not bruise/bleed easily.  Skin: Negative for nail changes.  Musculoskeletal: Negative for muscle weakness and myalgias.  Gastrointestinal: Negative for abdominal pain, change in bowel habit, nausea and vomiting.  Neurological: Negative for difficulty with concentration, dizziness, focal weakness and headaches.  Psychiatric/Behavioral: Negative for altered mental status and suicidal ideas.  All other systems reviewed and are negative.     Objective:  Blood pressure (!) 141/71, pulse (!) 57, temperature 97.6 F (36.4 C), resp. rate 16, height 5\' 6"  (1.676 m), weight 207 lb 12.8 oz (94.3 kg), SpO2 97 %. Body mass index is 33.54 kg/m.    Physical Exam  Constitutional: She is oriented to person, place, and time. Vital signs are normal. She appears well-developed and well-nourished.  HENT:  Head: Normocephalic and atraumatic.  Cardiovascular: Normal rate, regular rhythm, normal heart sounds and intact distal pulses.  Pulmonary/Chest: Effort normal and breath sounds normal. No accessory muscle usage. No respiratory distress.  Abdominal: Soft. Bowel sounds are normal.  Musculoskeletal:        General: Normal range of motion.     Cervical back: Normal range of motion.  Neurological: She is alert and oriented to person, place, and time.  Skin: Skin is warm and dry.  Vitals reviewed.  Radiology: No results found.  Laboratory examination:    CMP Latest Ref Rng & Units 04/19/2019 02/25/2018 01/09/2016  Glucose 70 - 99 mg/dL - 126(H) -  BUN 8 - 23 mg/dL - 14 -  Creatinine 0.44 - 1.00 mg/dL - 0.86 0.76  Sodium 135 - 145 mmol/L - 140 -  Potassium 3.5 - 5.1 mmol/L - 3.3(L) -  Chloride 98 - 111 mmol/L - 111 -  CO2 22 - 32 mmol/L - 22 -  Calcium 8.9 - 10.3 mg/dL - 8.8(L) -  Total Protein 6.0 - 8.5 g/dL 6.9 7.2 -  Total Bilirubin 0.3 - 1.2 mg/dL - 0.2(L) -  Alkaline Phos 38 - 126 U/L - 68 -  AST 15  - 41 U/L - 20 -  ALT 0 - 44 U/L - 15 -   CBC Latest Ref Rng & Units 02/25/2018 01/09/2016 12/29/2015  WBC 4.0 - 10.5 K/uL 7.9 11.9(H) 8.2  Hemoglobin 12.0 - 15.0 g/dL 11.6(L) 12.3 13.7  Hematocrit 36.0 - 46.0 % 37.0 37.3 41.5  Platelets 150 - 400 K/uL 213 281 253   Lipid Panel     Component Value Date/Time   CHOL  07/11/2009 0500    180        ATP III CLASSIFICATION:  <200     mg/dL   Desirable  200-239  mg/dL  Borderline High  >=240    mg/dL   High          TRIG 53 07/11/2009 0500   HDL 66 07/11/2009 0500   CHOLHDL 2.7 07/11/2009 0500   VLDL 11 07/11/2009 0500   LDLCALC (H) 07/11/2009 0500    103        Total Cholesterol/HDL:CHD Risk Coronary Heart Disease Risk Table                     Men   Women  1/2 Average Risk   3.4   3.3  Average Risk       5.0   4.4  2 X Average Risk   9.6   7.1  3 X Average Risk  23.4   11.0        Use the calculated Patient Ratio above and the CHD Risk Table to determine the patient's CHD Risk.        ATP III CLASSIFICATION (LDL):  <100     mg/dL   Optimal  100-129  mg/dL   Near or Above                    Optimal  130-159  mg/dL   Borderline  160-189  mg/dL   High  >190     mg/dL   Very High   HEMOGLOBIN A1C  TSH Recent Labs    04/19/19 1059  TSH 0.033*    PRN Meds:. There are no discontinued medications. Current Meds  Medication Sig  . acetaminophen-codeine (TYLENOL #4) 300-60 MG tablet Take 1 tablet by mouth 4 (four) times daily as needed for moderate pain.  . Ascorbic Acid (VITAMIN C PO) Take 1 tablet by mouth daily.  . B Complex Vitamins (VITAMIN B COMPLEX PO) Take 1 tablet by mouth daily.  . Camphor-Eucalyptus-Menthol (VICKS VAPORUB EX) Apply topically. Uses on feet prn for pain  (family dollar brand)  . escitalopram (LEXAPRO) 20 MG tablet Take 20 mg by mouth at bedtime.   . gabapentin (NEURONTIN) 600 MG tablet Take 2 tablets (1,200 mg total) by mouth 3 (three) times daily.  . Liniments (BEN GAY EX) Apply 1 application  topically as needed (joint pain).  . methocarbamol (ROBAXIN) 500 MG tablet Take 1 tablet (500 mg total) by mouth 3 (three) times daily. (Patient taking differently: Take 500 mg by mouth as needed. )  . potassium chloride (KLOR-CON) 10 MEQ tablet Take 1 tablet by mouth daily.  Marland Kitchen Propylene Glycol (SYSTANE COMPLETE) 0.6 % SOLN Place 1 drop into both eyes as needed (dry eyes).  . topiramate (TOPAMAX) 200 MG tablet Take 200 mg by mouth at bedtime.   . traZODone (DESYREL) 150 MG tablet Take 150 mg by mouth at bedtime.  Marland Kitchen VITAMIN D PO Take 1 tablet by mouth daily.  Marland Kitchen warfarin (COUMADIN) 6 MG tablet Take 6-9 mg by mouth daily. Take 9mg  Monday, Wednesday, and Fridays, then all other days take 6mg s daily    Cardiac Studies:   Echocardiogram 04/19/2019:  Normal LV systolic function with EF 65%. Left ventricle cavity is normal  in size. Normal left ventricular wall thickness. Normal global wall  motion. Indeterminate diastolic filling pattern. No obvious regional wall  motion abnormalities.  Structurally normal aortic valve. No evidence of aortic stenosis. Moderate  (Grade II) aortic regurgitation.  Mild (Grade I) mitral regurgitation.  Mild to moderate tricuspid regurgitation.  Mild pulmonic regurgitation.  Prior study dated 04/02/2018 noted LVEF of 55 %, no  regional wall motion  abnormalities, moderate AR, mild MR, moderate TR, RVSP 11mmHg. RVSP now 41mmHg  Lexiscan Tetrofosmin Stress Test  03/17/2019: Nondiagnostic ECG stress. Normal myocardial perfusion. Stress LV EF: 67%.  No previous exam available for comparison. Low risk study.   Assessment:   Dyspnea on exertion  Fatigue, unspecified type  Moderate aortic regurgitation  Obesity (BMI 30-39.9)  EKG 03/29/2018: Normal sinus rhythm at 63 bpm, normal axis, PRWP cannot exclude anterior infarct old. No evidence of ischemia.   Recommendations:   I have reviewed and discussed her recent echocardiogram, aortic regurgitation has been  stable compared to her previous echo.  PA pressures are slightly improved compared to previous echo though.  I suspect her dyspnea on exertion may be multifactorial related to her weight and deconditioning.  Her activity is limited due to chronic back pain and neuropathy.  We have discussed ways to incorporate regular exercise including riding stationary bicycle.  Her dyspnea may also be contributed by possible sleep apnea, she is to have this evaluated in the next few weeks.  I recommended that she make dietary changes to help with her weight.  Her blood pressure was slightly elevated today, but states that she has not significant neck pain, generally her blood pressure is well controlled.  As her stress test was considered low risk and she has not had any significant changes by her echocardiogram, I recommended lifestyle changes to see if her dyspnea will improve.  Will plan to see her back in 3 to 4 months for follow-up on her efforts toward lifestyle changes and dyspnea on exertion.  Miquel Dunn, MSN, APRN, FNP-C [facility]. [employer] Office: [phone] Fax: [phone]

## 2019-04-30 DIAGNOSIS — F331 Major depressive disorder, recurrent, moderate: Secondary | ICD-10-CM | POA: Diagnosis not present

## 2019-04-30 DIAGNOSIS — F315 Bipolar disorder, current episode depressed, severe, with psychotic features: Secondary | ICD-10-CM | POA: Diagnosis not present

## 2019-05-05 ENCOUNTER — Other Ambulatory Visit: Payer: Self-pay

## 2019-05-05 ENCOUNTER — Ambulatory Visit
Admission: RE | Admit: 2019-05-05 | Discharge: 2019-05-05 | Disposition: A | Discharge status: Home / Self Care | Payer: Medicare Other | Source: Ambulatory Visit | Attending: Physical Medicine & Rehabilitation | Admitting: Physical Medicine & Rehabilitation

## 2019-05-05 ENCOUNTER — Ambulatory Visit: Payer: Medicare Other | Attending: Internal Medicine

## 2019-05-05 DIAGNOSIS — G8929 Other chronic pain: Secondary | ICD-10-CM

## 2019-05-05 DIAGNOSIS — M722 Plantar fascial fibromatosis: Secondary | ICD-10-CM | POA: Diagnosis not present

## 2019-05-05 DIAGNOSIS — Z23 Encounter for immunization: Secondary | ICD-10-CM | POA: Insufficient documentation

## 2019-05-05 DIAGNOSIS — M65872 Other synovitis and tenosynovitis, left ankle and foot: Secondary | ICD-10-CM | POA: Diagnosis not present

## 2019-05-05 DIAGNOSIS — M545 Low back pain: Secondary | ICD-10-CM | POA: Diagnosis not present

## 2019-05-05 NOTE — Progress Notes (Signed)
   Covid-19 Vaccination Clinic  Name:  Emily Peck    MRN: [id number] DOB: 09-Oct-1955  05/05/2019  [name] was observed post Covid-19 immunization for 15 minutes without incident. She was provided with Vaccine Information Sheet and instruction to access the V-Safe system.   [name] was instructed to call 911 with any severe reactions post vaccine: Marland Kitchen Difficulty breathing  . Swelling of face and throat  . A fast heartbeat  . A bad rash all over body  . Dizziness and weakness   Immunizations Administered    Name Date Dose VIS Date Route   Pfizer COVID-19 Vaccine 05/05/2019  2:25 PM 0.3 mL 02/12/2019 Intramuscular   Manufacturer: [place]   Lot: [id number]   [place]: [id number]

## 2019-05-08 ENCOUNTER — Other Ambulatory Visit: Payer: Self-pay | Admitting: Neurology

## 2019-05-11 ENCOUNTER — Ambulatory Visit (INDEPENDENT_AMBULATORY_CARE_PROVIDER_SITE_OTHER): Payer: Medicare Other | Admitting: Neurology

## 2019-05-11 DIAGNOSIS — H5442A4 Blindness left eye category 4, normal vision right eye: Secondary | ICD-10-CM

## 2019-05-11 DIAGNOSIS — G4719 Other hypersomnia: Secondary | ICD-10-CM

## 2019-05-11 DIAGNOSIS — G4710 Hypersomnia, unspecified: Secondary | ICD-10-CM

## 2019-05-11 DIAGNOSIS — J342 Deviated nasal septum: Secondary | ICD-10-CM

## 2019-05-11 DIAGNOSIS — R519 Headache, unspecified: Secondary | ICD-10-CM

## 2019-05-11 DIAGNOSIS — R351 Nocturia: Secondary | ICD-10-CM

## 2019-05-11 DIAGNOSIS — G4727 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: Secondary | ICD-10-CM

## 2019-05-11 DIAGNOSIS — G629 Polyneuropathy, unspecified: Secondary | ICD-10-CM

## 2019-05-11 DIAGNOSIS — G08 Intracranial and intraspinal phlebitis and thrombophlebitis: Secondary | ICD-10-CM

## 2019-05-11 DIAGNOSIS — M2619 Other specified anomalies of jaw-cranial base relationship: Secondary | ICD-10-CM

## 2019-05-19 DIAGNOSIS — M71572 Other bursitis, not elsewhere classified, left ankle and foot: Secondary | ICD-10-CM | POA: Diagnosis not present

## 2019-05-19 DIAGNOSIS — M7662 Achilles tendinitis, left leg: Secondary | ICD-10-CM | POA: Diagnosis not present

## 2019-05-21 DIAGNOSIS — Z7901 Long term (current) use of anticoagulants: Secondary | ICD-10-CM | POA: Diagnosis not present

## 2019-05-21 DIAGNOSIS — Z952 Presence of prosthetic heart valve: Secondary | ICD-10-CM | POA: Diagnosis not present

## 2019-05-23 DIAGNOSIS — G4727 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: Secondary | ICD-10-CM | POA: Insufficient documentation

## 2019-05-23 NOTE — Progress Notes (Signed)
IMPRESSION:   1. No evidence of Obstructive Sleep Apnea (OSA)  2. No evidence of Periodic Limb Movement Disorder (PLMD)  3. Some Primary Snoring  4. Normal heart rate by EKG, slowed EEG.    RECOMMENDATIONS:   1. The patient's hypersomnia is unrelated to a sleep breathing  disorder. It is most likely related to circadian rhythm  dysfunction /changes in a blind person. Melatonin at night may  help. External setting of sleep time, rise time, and use of  Modafinil in daytime can be helpful.

## 2019-05-23 NOTE — Procedures (Signed)
PATIENT'S NAME:  Emily Peck, Emily Peck DOB:      25-Apr-1955      MR#:    [id number]     DATE OF RECORDING: 05/11/2019 REFERRING M.D.:  Emily Cliche, NP Study Performed:  Expanded Montage Polysomnogram HISTORY:  This 64 year-old African American right handed female patient was seen upon a referral on 04/19/2019 from [facility].  The referral was sent by Emily Peck, Nurse Practitioner in cardiology, who had seen Emily Peck at her office on 30 March 2019- with dyspnea on exertion, chest pain, and for follow-up.  Chief concern according to patient: ' I toss and turn all night ", and: "I fell asleep at work, and this often!". The patient has a coagulopathy, also suffers from gastroesophageal reflux disease, depression, chronic headaches, lower back pain, has been evaluated by Dr. Jaynee Peck for neuropathy, post-laminectomy symptoms in the lumbar and thoracic region, and there is also a stroke dx. in 2004. She was diagnosed with mild pulmonary hypertension by an echocardiogram in 2017.  Very important is that the patient is legally blind; she has a prosthetic right eye and very small field of vision remaining in the left.  She is basically just able to see in the central visual field, able to perceive silhouettes and movement, and light. Nocturia up to 3-4 times, a history of cervical spine surgery after a "slipped disc": " My fusion didn't heal, non-fusion, needed rods"; "I have frequent sinusitis and can't breathe through my nose"  Patient moved from [place], she is working for [employer] for [employer] in [place], and lives in a household with her son (54). The patient endorsed the Epworth Sleepiness Scale at 17/24 points.   The patient's weight 203 pounds with a height of 66 (inches), resulting in a BMI of 32.6 kg/m2. The patient's neck circumference measured 15 inches.  CURRENT MEDICATIONS: Tylenol, Lexapro, Neurontin, Robaxin, Klor-con, Topamax, Desyrel, Coumadin The patient was  given a sleep aid for this study.    PROCEDURE:  This is a multichannel digital polysomnogram utilizing the Somnostar 11.2 system.  Electrodes and sensors were applied and monitored per AASM Specifications.   EEG, EOG, Chin and Limb EMG, were sampled at 200 Hz.  ECG, Snore and Nasal Pressure, Thermal Airflow, Respiratory Effort, CPAP Flow and Pressure, Oximetry was sampled at 50 Hz. Digital video and audio were recorded.      BASELINE STUDY: Lights Out was at 20:53 and Lights On at 04:00.  Total recording time (TRT) was 428 minutes, with a total sleep time (TST) of 411.5 minutes.   The patient's sleep latency was 0.5 minutes.  REM latency was 377 minutes.  The sleep efficiency was 96.1 %.     SLEEP ARCHITECTURE: WASO (Wake after sleep onset) was 15.5 minutes.  There were 15 minutes in Stage N1, 53 minutes Stage N2, 315.5 minutes Stage N3 and 28 minutes in Stage REM.  The percentage of Stage N1 was 3.6%, Stage N2 was 12.9%, Stage N3 was 76.7% and Stage R (REM sleep) was 6.8%. Please note that the sleep aid influenced the sleep EEG towards slower rhythms, N3 sleep.    RESPIRATORY ANALYSIS:  There were a total of 12 respiratory events:  1 obstructive apnea, 0 central apneas and 1 mixed apnea with 10 hypopneas. Snoring was recorded.      The total APNEA/HYPOPNEA INDEX (AHI) was 1.7/hour.  0 events occurred in REM sleep and 21 events in NREM. The REM AHI was  0 /hour, versus a non-REM  AHI of 1.9. The patient spent 293.5 minutes of total sleep time in the supine position and 118 minutes in non-supine. The supine AHI was 2.0 versus a non-supine AHI of 1.0.  OXYGEN SATURATION & C02:  The Wake baseline 02 saturation was 95%, with the lowest being 85%. Time spent below 89% saturation equaled 2 minutes. The arousals were noted as: 34 were spontaneous, 0 were associated with PLMs, 3 were associated with respiratory events. The patient had a total of 0 Periodic Limb Movements.   Audio and video analysis did not  show any abnormal or unusual movements, behaviors, phonations or vocalizations.  Up for Nocturia once during the recorded time. Snoring was noted. Single EKG was in keeping with normal sinus rhythm (NSR).  IMPRESSION:  1. No evidence of Obstructive Sleep Apnea (OSA) 2. No evidence of Periodic Limb Movement Disorder (PLMD) 3. Some Primary Snoring 4. Normal heart rate by EKG, slowed EEG.   RECOMMENDATIONS:  1. The patient's hypersomnia is unrelated to a sleep breathing disorder. It is most likely related to circadian rhythm dysfunction /changes in a blind person. Melatonin at night may help. External setting of sleep time, rise time, and use of Modafinil in daytime can be helpful.     I certify that I have reviewed the entire raw data recording prior to the issuance of this report in accordance with the Standards of Accreditation of the American Academy of Sleep Medicine (AASM)    Larey Seat, MD Diplomat, American Board of Psychiatry and Neurology  Diplomat, American Board of Sleep Medicine Market researcher, [facility] [employer]

## 2019-05-25 ENCOUNTER — Telehealth: Payer: Self-pay | Admitting: Neurology

## 2019-05-25 NOTE — Telephone Encounter (Signed)
Called the patient and we reviewed the sleep study in detail. I informed her that the sleep study didn't show any sleep disordered breathing. There was no concern of apnea, hypoxemia or irregular heart rhythm. Advised that the daytime sleepiness can be related to circadian rhythm dysfunction due to legally blind. Encouraged to establish good sleep hygiene and introduce melatonin at bedtime to help with sleep during the night. Advised the patient that if doing those things were not helpful with the daytime sleepiness, we could always add the modafinil to assist with treating the daytime sleepiness. Pt verbalized understanding. Pt had no questions at this time but was encouraged to call back if questions arise. Will call if we need to order the modafinil.

## 2019-05-25 NOTE — Telephone Encounter (Signed)
-----   Message from Larey Seat, MD sent at 05/23/2019  2:46 PM EDT ----- IMPRESSION:   1. No evidence of Obstructive Sleep Apnea (OSA)  2. No evidence of Periodic Limb Movement Disorder (PLMD)  3. Some Primary Snoring  4. Normal heart rate by EKG, slowed EEG.    RECOMMENDATIONS:   1. The patient's hypersomnia is unrelated to a sleep breathing  disorder. It is most likely related to circadian rhythm  dysfunction /changes in a blind person. Melatonin at night may  help. External setting of sleep time, rise time, and use of  Modafinil in daytime can be helpful.

## 2019-06-08 ENCOUNTER — Telehealth: Payer: Self-pay | Admitting: Physical Medicine & Rehabilitation

## 2019-06-08 DIAGNOSIS — M7662 Achilles tendinitis, left leg: Secondary | ICD-10-CM | POA: Diagnosis not present

## 2019-06-08 DIAGNOSIS — M25572 Pain in left ankle and joints of left foot: Secondary | ICD-10-CM | POA: Diagnosis not present

## 2019-06-08 NOTE — Telephone Encounter (Signed)
Patient calling about her Xray results and would like to know when she should come back into office.  Please advise.

## 2019-06-09 NOTE — Telephone Encounter (Signed)
Xrays without fractures or new abnormalities.  Would schedule repeat Left L3-4-5 RF in May

## 2019-06-09 NOTE — Telephone Encounter (Signed)
I have patient scheduled for 5/20 with Dr. Letta Pate for RF--patient is on blood thinner.  Please call patient for pre-procedure instructions.

## 2019-06-09 NOTE — Telephone Encounter (Signed)
Patient notified and scheduling appt.

## 2019-06-15 ENCOUNTER — Other Ambulatory Visit: Payer: Self-pay | Admitting: Physical Medicine & Rehabilitation

## 2019-06-16 NOTE — Telephone Encounter (Signed)
PMP was Reviewed: Last Tylenol #4 was filled on 05/16/2019. Ms. Pandy has a scheduled appointment with Dr Letta Pate in May. Placed a call to Ms. Trulson regarding the above, no answer. Left message to return the call.

## 2019-06-18 ENCOUNTER — Telehealth: Payer: Self-pay | Admitting: Physical Medicine & Rehabilitation

## 2019-06-18 ENCOUNTER — Telehealth: Payer: Self-pay | Admitting: Registered Nurse

## 2019-06-18 DIAGNOSIS — H4422 Degenerative myopia, left eye: Secondary | ICD-10-CM | POA: Diagnosis not present

## 2019-06-18 DIAGNOSIS — Z7901 Long term (current) use of anticoagulants: Secondary | ICD-10-CM | POA: Diagnosis not present

## 2019-06-18 DIAGNOSIS — H35372 Puckering of macula, left eye: Secondary | ICD-10-CM | POA: Diagnosis not present

## 2019-06-18 DIAGNOSIS — Z952 Presence of prosthetic heart valve: Secondary | ICD-10-CM | POA: Diagnosis not present

## 2019-06-18 NOTE — Telephone Encounter (Signed)
Returned call, let patient know her prescription had been called in by [place].

## 2019-06-18 NOTE — Telephone Encounter (Signed)
States She was returning Eunice's call, and did not want to leave a message on clinic line.

## 2019-06-18 NOTE — Telephone Encounter (Signed)
Returning [employer] phone call.  Please call patient back.

## 2019-06-18 NOTE — Telephone Encounter (Signed)
Renae, left message for Ms. Cozort, regarding her medication.

## 2019-06-23 NOTE — Telephone Encounter (Signed)
No need to stop blood thinner for RF

## 2019-06-23 NOTE — Telephone Encounter (Signed)
Left message for patient to call back  

## 2019-06-23 NOTE — Telephone Encounter (Signed)
Patient notified

## 2019-07-14 ENCOUNTER — Telehealth: Payer: Self-pay | Admitting: *Deleted

## 2019-07-14 MED ORDER — ACETAMINOPHEN-CODEINE 300-60 MG PO TABS: ORAL_TABLET | ORAL | 0 refills | Status: DC

## 2019-07-14 MED ORDER — DIAZEPAM 10 MG PO TABS: ORAL_TABLET | ORAL | 0 refills | Status: DC

## 2019-07-14 MED ORDER — METHOCARBAMOL 500 MG PO TABS: 500.0000 mg | ORAL_TABLET | Freq: Three times a day (TID) | ORAL | 0 refills | Status: DC | PRN

## 2019-07-14 NOTE — Telephone Encounter (Signed)
Ms Alen called and reqeusted refill on her tylenol #4, a valium for her procedure, and a refill on her muscle relaxer.  PMP shows last fill on Tylenol #4 was 06/16/19.  Refill called to pharmacy and Ms Mazique notified. Her injection is for 07/22/19.

## 2019-07-22 ENCOUNTER — Other Ambulatory Visit: Payer: Self-pay

## 2019-07-22 ENCOUNTER — Encounter: Payer: Medicare Other | Attending: Physical Medicine & Rehabilitation | Admitting: Physical Medicine & Rehabilitation

## 2019-07-22 ENCOUNTER — Encounter: Payer: Self-pay | Admitting: Physical Medicine & Rehabilitation

## 2019-07-22 VITALS — BP 143/83 | HR 75 | Temp 97.5°F | Ht 66.0 in | Wt 205.6 lb

## 2019-07-22 DIAGNOSIS — M47817 Spondylosis without myelopathy or radiculopathy, lumbosacral region: Secondary | ICD-10-CM | POA: Insufficient documentation

## 2019-07-22 DIAGNOSIS — Z952 Presence of prosthetic heart valve: Secondary | ICD-10-CM | POA: Diagnosis not present

## 2019-07-22 DIAGNOSIS — Z7901 Long term (current) use of anticoagulants: Secondary | ICD-10-CM | POA: Diagnosis not present

## 2019-07-22 MED ORDER — ACETAMINOPHEN-CODEINE 300-60 MG PO TABS: ORAL_TABLET | ORAL | 2 refills | Status: DC

## 2019-07-22 NOTE — Progress Notes (Signed)
  PROCEDURE RECORD [facility]   Name: Emily Peck DOB:[birth year] MRN: [id number]  Date:07/22/2019  Physician: Alysia Penna, MD    Nurse/CMA: Wessling CMA  Allergies:  Allergies  Allergen Reactions  . No Known Allergies     Consent Signed: Yes.    Is patient diabetic? No.  CBG today?   Pregnant: No. LMP: No LMP recorded. Patient has had a hysterectomy. (age [age])  Anticoagulants: yes (stopped 07/19/19 Reports INR 1.9) Anti-inflammatory: no Antibiotics: no  Procedure: Left lumbar 3-4-5 Radiofrequency Neurotomy Position: Prone Start Time: 2:06pm End Time: 2:20pm  Fluoro Time: 36s  RN/CMA Sports administrator CMA    Time 1:22 2:06pm    BP 143/83 150/90    Pulse 75 59    Respirations 14 14    O2 Sat 95 96    S/S 6 6    Pain Level 5 5     D/C home with "[employer]" (like [employer]), patient A & O X 3, D/C instructions reviewed, and sits independently.

## 2019-07-22 NOTE — Patient Instructions (Signed)

## 2019-07-22 NOTE — Progress Notes (Signed)
Left L5 dorsal ramus., left L4 and left L3 medial branch radio frequency neurotomy under fluoroscopic guidance ° °Indication: Low back pain due to lumbar spondylosis which has been relieved on 2 occasions by greater than 50% by lumbar medial branch blocks at corresponding levels. ° °Informed consent was obtained after describing risks and benefits of the procedure with the patient, this includes bleeding, bruising, infection, paralysis and medication side effects. The patient wishes to proceed and has given written consent. The patient was placed in a prone position. The lumbar and sacral area was marked and prepped with Betadine. A 25-gauge 1-1/2 inch needle was inserted into the skin and subcutaneous tissue at 3 sites in one ML of 1% lidocaine was injected into each site. Then a 18-gauge 10 cm radio frequency needle (would consider 15cm for subsequent, since needle buried to hub) with a 1 cm curved active tip was inserted targeting the left S1 SAP/sacral ala junction. Bone contact was made and confirmed with lateral imaging.  motor stimulation at 2 Hz confirm proper needle location followed by injection of one ML of 1% MPF lidocaine. Then the left L5 SAP/transverse process junction was targeted. Bone contact was made and confirmed with lateral imaging motor stimulation at 2 Hz confirm proper needle location followed by injection of one ML of the solution containing one ML of  1% MPF lidocaine. Then the left L4 SAP/transverse process junction was targeted. Bone contact was made and confirmed with lateral imaging. motor stimulation at 2 Hz confirm proper needle location followed by injection of one ML of the solution containing one ML of1% MPF lidocaine. Radio frequency lesion  at 80°C for 90 seconds was performed. Needles were removed. Post procedure instructions and vital signs were performed. Patient tolerated procedure well. Followup appointment was given. ° °

## 2019-08-06 DIAGNOSIS — F5101 Primary insomnia: Secondary | ICD-10-CM | POA: Diagnosis not present

## 2019-08-06 DIAGNOSIS — F331 Major depressive disorder, recurrent, moderate: Secondary | ICD-10-CM | POA: Diagnosis not present

## 2019-08-06 DIAGNOSIS — F315 Bipolar disorder, current episode depressed, severe, with psychotic features: Secondary | ICD-10-CM | POA: Diagnosis not present

## 2019-08-20 ENCOUNTER — Ambulatory Visit: Payer: Medicare Other | Admitting: Physical Medicine & Rehabilitation

## 2019-08-20 DIAGNOSIS — Z952 Presence of prosthetic heart valve: Secondary | ICD-10-CM | POA: Diagnosis not present

## 2019-08-20 DIAGNOSIS — Z7901 Long term (current) use of anticoagulants: Secondary | ICD-10-CM | POA: Diagnosis not present

## 2019-09-02 ENCOUNTER — Ambulatory Visit: Payer: Medicare Other | Admitting: Cardiology

## 2019-09-03 ENCOUNTER — Encounter: Payer: Medicare Other | Admitting: Physical Medicine & Rehabilitation

## 2019-09-19 DIAGNOSIS — Z7901 Long term (current) use of anticoagulants: Secondary | ICD-10-CM | POA: Diagnosis not present

## 2019-09-19 DIAGNOSIS — Z952 Presence of prosthetic heart valve: Secondary | ICD-10-CM | POA: Diagnosis not present

## 2019-09-20 DIAGNOSIS — M722 Plantar fascial fibromatosis: Secondary | ICD-10-CM | POA: Diagnosis not present

## 2019-09-20 DIAGNOSIS — M25572 Pain in left ankle and joints of left foot: Secondary | ICD-10-CM | POA: Diagnosis not present

## 2019-09-20 DIAGNOSIS — M25571 Pain in right ankle and joints of right foot: Secondary | ICD-10-CM | POA: Diagnosis not present

## 2019-09-24 DIAGNOSIS — M7061 Trochanteric bursitis, right hip: Secondary | ICD-10-CM | POA: Diagnosis not present

## 2019-09-24 DIAGNOSIS — M25562 Pain in left knee: Secondary | ICD-10-CM | POA: Diagnosis not present

## 2019-09-24 DIAGNOSIS — Z96651 Presence of right artificial knee joint: Secondary | ICD-10-CM | POA: Diagnosis not present

## 2019-09-24 DIAGNOSIS — M1712 Unilateral primary osteoarthritis, left knee: Secondary | ICD-10-CM | POA: Diagnosis not present

## 2019-09-24 DIAGNOSIS — M25561 Pain in right knee: Secondary | ICD-10-CM | POA: Diagnosis not present

## 2019-09-24 DIAGNOSIS — M7062 Trochanteric bursitis, left hip: Secondary | ICD-10-CM | POA: Diagnosis not present

## 2019-09-28 ENCOUNTER — Other Ambulatory Visit: Payer: Self-pay

## 2019-09-28 MED ORDER — DIAZEPAM 10 MG PO TABS: ORAL_TABLET | ORAL | 0 refills | Status: DC

## 2019-10-01 ENCOUNTER — Other Ambulatory Visit: Payer: Self-pay

## 2019-10-01 ENCOUNTER — Encounter: Payer: Self-pay | Admitting: Physical Medicine & Rehabilitation

## 2019-10-01 ENCOUNTER — Encounter: Payer: Medicare Other | Attending: Physical Medicine & Rehabilitation | Admitting: Physical Medicine & Rehabilitation

## 2019-10-01 VITALS — BP 134/82 | HR 63 | Temp 98.8°F | Ht 66.0 in | Wt 201.0 lb

## 2019-10-01 DIAGNOSIS — M47817 Spondylosis without myelopathy or radiculopathy, lumbosacral region: Secondary | ICD-10-CM | POA: Diagnosis not present

## 2019-10-01 MED ORDER — ACETAMINOPHEN-CODEINE 300-60 MG PO TABS: ORAL_TABLET | ORAL | 2 refills | Status: DC

## 2019-10-01 NOTE — Patient Instructions (Signed)
You had a radio frequency procedure today This was done to alleviate joint pain in your lumbar area We injected lidocaine which is a local anesthetic.  You may experience soreness at the injection sites. You may also experienced some irritation of the nerves that were heated I'm recommending ice for 30 minutes every 2 hours as needed for the next 24-48 hours   

## 2019-10-01 NOTE — Progress Notes (Signed)
RightL5 dorsal ramus., Right L4 and Right L3 medial branch radio frequency neurotomy under fluoroscopic guidance   Indication: Low back pain due to lumbar spondylosis which has been relieved on 2 occasions by greater than 50% by lumbar medial branch blocks and prior RFA for 87months.  No need t stop anticoagulants for thi low risk procedure per ASRA guidelines  Informed consent was obtained after describing risks and benefits of the procedure with the patient, this includes bleeding, bruising, infection, paralysis and medication side effects. .The patient wishes to proceed and has given written consent. The patient was placed in a prone position. The lumbar and sacral area was marked and prepped with Betadine. A 25-gauge 1-1/2 inch needle was inserted into the skin and subcutaneous tissue at 3 sites in one ML of 1% lidocaine was injected into each site. Then a 18-gauge 10 cm radio frequency needle with a 1 cm curved active tip was inserted targeting the Right S1 SAP/sacral ala junction. Bone contact was made and confirmed with lateral imaging.  motor stimulation at 2 Hz confirm proper needle location followed by injection of 72ml 2% MPF lidocaine. Then the Right L5 SAP/transverse process junction was targeted. Bone contact was made and confirmed with lateral imaging.  motor stimulation at 2 Hz confirm proper needle location followed by injection of 94ml 2% MPF lidocaine. Then the Right L4 SAP/transverse process junction was targeted. Bone contact was made and confirmed with lateral imaging. motor stimulation at 2 Hz confirm proper needle location followed by injection of 35ml 2% MPF lidocaine. Radio frequency lesion being at [facility] for 90 seconds was performed. Needles were removed. Post procedure instructions and vital signs were performed. Patient tolerated procedure well. Followup appointment was given.

## 2019-10-01 NOTE — Progress Notes (Signed)
  PROCEDURE RECORD [facility]   Name: Emily Peck DOB:01-Dec-1955 MRN: [id number]  Date:10/01/2019  Physician: Alysia Penna, MD    Nurse/CMA: Zoie Sarin, CMA   Allergies:  Allergies  Allergen Reactions  . No Known Allergies     Consent Signed: Yes.    Is patient diabetic? No.  CBG today?   Pregnant: No. LMP: No LMP recorded. Patient has had a hysterectomy. (age [age])  Anticoagulants: yes (coumadin stopped taking 2 days ago voluntarily, INR=1.9) Anti-inflammatory: no Antibiotics: no  Procedure: right L3,4,5 radiofrequency Position: Prone Start Time: 11:56am  End Time: 12:10pm   Fluoro Time: 34s   RN/CMA Addley Ballinger, CMA Briena Swingler, CMA    Time 11:46am 12:23pm    BP 134/82 156/88    Pulse 63 60    Respirations 14 14    O2 Sat 95 96    S/S 6 6    Pain Level 4/10 0/10     D/C home with transportation, patient A & O X 3, D/C instructions reviewed, and sits independently.

## 2019-10-22 DIAGNOSIS — M25571 Pain in right ankle and joints of right foot: Secondary | ICD-10-CM | POA: Diagnosis not present

## 2019-10-22 DIAGNOSIS — M7671 Peroneal tendinitis, right leg: Secondary | ICD-10-CM | POA: Diagnosis not present

## 2019-10-23 DIAGNOSIS — Z7901 Long term (current) use of anticoagulants: Secondary | ICD-10-CM | POA: Diagnosis not present

## 2019-10-23 DIAGNOSIS — Z952 Presence of prosthetic heart valve: Secondary | ICD-10-CM | POA: Diagnosis not present

## 2019-10-29 DIAGNOSIS — F331 Major depressive disorder, recurrent, moderate: Secondary | ICD-10-CM | POA: Diagnosis not present

## 2019-10-29 DIAGNOSIS — F315 Bipolar disorder, current episode depressed, severe, with psychotic features: Secondary | ICD-10-CM | POA: Diagnosis not present

## 2019-10-29 DIAGNOSIS — F5101 Primary insomnia: Secondary | ICD-10-CM | POA: Diagnosis not present

## 2019-11-12 ENCOUNTER — Encounter: Payer: Self-pay | Admitting: Physical Medicine & Rehabilitation

## 2019-11-12 ENCOUNTER — Telehealth: Payer: Self-pay

## 2019-11-12 ENCOUNTER — Other Ambulatory Visit: Payer: Self-pay

## 2019-11-12 ENCOUNTER — Encounter: Payer: Medicare Other | Attending: Physical Medicine & Rehabilitation | Admitting: Physical Medicine & Rehabilitation

## 2019-11-12 VITALS — BP 129/83 | HR 59 | Temp 98.4°F | Ht 66.0 in | Wt 207.4 lb

## 2019-11-12 DIAGNOSIS — M47817 Spondylosis without myelopathy or radiculopathy, lumbosacral region: Secondary | ICD-10-CM | POA: Diagnosis not present

## 2019-11-12 NOTE — Progress Notes (Signed)
Subjective:    Patient ID: Emily Peck, female    DOB: 04/26/1955, 64 y.o.   MRN: [id number] Chief complaint right sided mid back pain HPI  64 year old female with chronic low back pain and history of T8-L1 thoracolumbar fusion.  She has mainly complained of right-sided greater than left-sided low back pain and has done well after L3-L4 medial branch and L5 dorsal ramus radiofrequency neurotomy which was last performed 10/01/2019.  The patient states that she has had pain right mid back area which worsens with prolonged standing.  She works full-time at the [employer] but had to take a day off due to this pain.  She saw the nurse at work and was wondering whether was a pneumonia at one point.  She was checked for Covid and was negative.  She never developed any fever .   The patient denies any shortness of breath but she states when the pain gets severe it "takes my breath away" The patient is legally blind and ambulates with a cane for the visually impaired Pain Inventory Average Pain 8 Pain Right Now 5 My pain is sharp  In the last 24 hours, has pain interfered with the following? General activity 2 Relation with others 4 Enjoyment of life 4 What TIME of day is your pain at its worst? daytime Sleep (in general) Fair  Pain is worse with: walking, bending and standing Pain improves with: heat/ice and medication Relief from Meds: 2  Family History  Problem Relation Age of Onset  . Cancer Mother   . Diabetes Mother    Social History   Socioeconomic History  . Marital status: Single    Spouse name: Not on file  . Number of children: 2  . Years of education: Not on file  . Highest education level: Not on file  Occupational History  . Not on file  Tobacco Use  . Smoking status: Never Smoker  . Smokeless tobacco: Never Used  Substance and Sexual Activity  . Alcohol use: No  . Drug use: No  . Sexual activity: Not on file  Other Topics Concern  . Not on file   Social History Narrative  . Not on file   Social Determinants of Health   Financial Resource Strain:   . Difficulty of Paying Living Expenses: Not on file  Food Insecurity:   . Worried About Charity fundraiser in the Last Year: Not on file  . Ran Out of Food in the Last Year: Not on file  Transportation Needs:   . Lack of Transportation (Medical): Not on file  . Lack of Transportation (Non-Medical): Not on file  Physical Activity:   . Days of Exercise per Week: Not on file  . Minutes of Exercise per Session: Not on file  Stress:   . Feeling of Stress : Not on file  Social Connections:   . Frequency of Communication with Friends and Family: Not on file  . Frequency of Social Gatherings with Friends and Family: Not on file  . Attends Religious Services: Not on file  . Active Member of Clubs or Organizations: Not on file  . Attends Archivist Meetings: Not on file  . Marital Status: Not on file   Past Surgical History:  Procedure Laterality Date  . ABDOMINAL HYSTERECTOMY    . BACK SURGERY    . EYE SURGERY     eye removed right   . KNEE ARTHROSCOPY    . TOTAL KNEE  ARTHROPLASTY Right 01/09/2016   Procedure: TOTAL KNEE ARTHROPLASTY;  Surgeon: Melrose Nakayama, MD;  Location: [place];  Service: Orthopedics;  Laterality: Right;   Past Surgical History:  Procedure Laterality Date  . ABDOMINAL HYSTERECTOMY    . BACK SURGERY    . EYE SURGERY     eye removed right   . KNEE ARTHROSCOPY    . 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   Procedure: TOTAL KNEE ARTHROPLASTY;  Surgeon: Melrose Nakayama, MD;  Location: [facility];  Service: Orthopedics;  Laterality: Right;   Past Medical History:  Diagnosis Date  . Anxiety   . Clotting disorder ([facility])   . Depression   . Dyspnea   . GERD (gastroesophageal reflux disease)   . Headache   . Lumbosacral spondylosis without myelopathy   . Neuropathy   . Postlaminectomy syndrome, lumbar region   . Postlaminectomy syndrome, thoracic region    . Stroke ([facility])    BP 129/83   Pulse (!) 59   Temp 98.4 F (36.9 C)   Ht 5\' 6"  (1.676 m)   Wt 207 lb 6.4 oz (94.1 kg)   SpO2 96%   BMI 33.48 kg/m   Opioid Risk Score:   Fall Risk Score:  `1  Depression screen PHQ 2/9  Depression screen [facility] 2/9 07/22/2019 03/09/2019 06/09/2018 07/07/2017 04/11/2017 01/03/2017 06/05/2015  Decreased Interest 0 0 1 0 0 0 2  Down, Depressed, Hopeless 0 0 1 - 0 0 1  PHQ - 2 Score 0 0 2 0 0 0 3  Altered sleeping - - - - - - -  Tired, decreased energy - - - - - - -  Change in appetite - - - - - - -  Feeling bad or failure about yourself  - - - - - - -  Trouble concentrating - - - - - - -  Moving slowly or fidgety/restless - - - - - - -  Suicidal thoughts - - - - - - -  PHQ-9 Score - - - - - - -  Difficult doing work/chores - - - - - - -  Some recent data might be hidden     Review of Systems  Musculoskeletal: Positive for back pain.  Neurological: Positive for weakness.  All other systems reviewed and are negative.      Objective:   Physical Exam Vitals and nursing note reviewed.  Constitutional:      General: She is not in acute distress.    Appearance: She is obese.  HENT:     Head: Normocephalic and atraumatic.  Eyes:     Extraocular Movements: Extraocular movements intact.     Conjunctiva/sclera: Conjunctivae normal.     Pupils: Pupils are equal, round, and reactive to light.  Musculoskeletal:        General: No tenderness or deformity.     Cervical back: Normal range of motion and neck supple.     Comments: No tenderness palpation along the lumbosacral junction bilaterally there is mild tenderness left upper lumbar area and moderate tenderness right upper lumbar area.  Lumbar spine range of motion is limited to approximately 50% flexion extension Pain with standing greater than with sitting  Skin:    General: Skin is warm and dry.  Neurological:     Mental Status: She is alert and oriented to person, place, and time.  Psychiatric:          Mood and Affect: Mood normal.        Behavior:  Behavior normal.           Assessment & Plan:  #1.  Lumbar spondylosis without myelopathy.  She has had the lower lumbar, L3-4 L5 levels treated with radiofrequency neurotomy with greater than 50% reduction of pain at those levels. RightL5 dorsal ramus., Right L4 and Right L3 medial branch radio frequency neurotomy under fluoroscopic guidance was performed 10/01/2019 and the left side was performed on 07/22/2019    She now has pain above the treated levels on the right side.  We will set her up for right L1-L2 medial branch blocks.  She had previous 50% relief with bilateral L1-L2 medial branch blocks performed 09/29/2018.

## 2019-11-12 NOTE — Patient Instructions (Signed)
Will do injections Right mid back area

## 2019-11-12 NOTE — Telephone Encounter (Signed)
error 

## 2019-11-19 DIAGNOSIS — Z0000 Encounter for general adult medical examination without abnormal findings: Secondary | ICD-10-CM | POA: Diagnosis not present

## 2019-11-19 DIAGNOSIS — E875 Hyperkalemia: Secondary | ICD-10-CM | POA: Diagnosis not present

## 2019-11-19 DIAGNOSIS — Z7901 Long term (current) use of anticoagulants: Secondary | ICD-10-CM | POA: Diagnosis not present

## 2019-11-19 DIAGNOSIS — Z8673 Personal history of transient ischemic attack (TIA), and cerebral infarction without residual deficits: Secondary | ICD-10-CM | POA: Diagnosis not present

## 2019-11-19 DIAGNOSIS — Z952 Presence of prosthetic heart valve: Secondary | ICD-10-CM | POA: Diagnosis not present

## 2019-11-19 DIAGNOSIS — Z23 Encounter for immunization: Secondary | ICD-10-CM | POA: Diagnosis not present

## 2019-11-19 DIAGNOSIS — E559 Vitamin D deficiency, unspecified: Secondary | ICD-10-CM | POA: Diagnosis not present

## 2019-11-19 DIAGNOSIS — G8929 Other chronic pain: Secondary | ICD-10-CM | POA: Diagnosis not present

## 2019-11-19 DIAGNOSIS — Z1211 Encounter for screening for malignant neoplasm of colon: Secondary | ICD-10-CM | POA: Diagnosis not present

## 2019-11-19 DIAGNOSIS — K219 Gastro-esophageal reflux disease without esophagitis: Secondary | ICD-10-CM | POA: Diagnosis not present

## 2019-11-19 DIAGNOSIS — Z13820 Encounter for screening for osteoporosis: Secondary | ICD-10-CM | POA: Diagnosis not present

## 2019-11-23 ENCOUNTER — Other Ambulatory Visit: Payer: Self-pay | Admitting: Nurse Practitioner

## 2019-11-23 DIAGNOSIS — Z13820 Encounter for screening for osteoporosis: Secondary | ICD-10-CM

## 2019-11-28 DIAGNOSIS — Z23 Encounter for immunization: Secondary | ICD-10-CM | POA: Diagnosis not present

## 2019-12-02 ENCOUNTER — Telehealth: Payer: Self-pay

## 2019-12-02 DIAGNOSIS — B353 Tinea pedis: Secondary | ICD-10-CM | POA: Diagnosis not present

## 2019-12-02 DIAGNOSIS — M722 Plantar fascial fibromatosis: Secondary | ICD-10-CM | POA: Diagnosis not present

## 2019-12-02 DIAGNOSIS — M71571 Other bursitis, not elsewhere classified, right ankle and foot: Secondary | ICD-10-CM | POA: Diagnosis not present

## 2019-12-02 DIAGNOSIS — M71572 Other bursitis, not elsewhere classified, left ankle and foot: Secondary | ICD-10-CM | POA: Diagnosis not present

## 2019-12-02 MED ORDER — METHOCARBAMOL 500 MG PO TABS: 500.0000 mg | ORAL_TABLET | Freq: Three times a day (TID) | ORAL | 0 refills | Status: DC | PRN

## 2019-12-02 NOTE — Telephone Encounter (Signed)
Patient called stating a muscle relaxer was suppose to be sent in for her. She thought it was going to get sent in the last time she was in the office. Pharmacy does not have any prescription. [place]

## 2019-12-02 NOTE — Telephone Encounter (Signed)
Rx sent to [facility]

## 2019-12-10 ENCOUNTER — Encounter: Payer: Medicare Other | Admitting: Physical Medicine & Rehabilitation

## 2019-12-16 DIAGNOSIS — Z7901 Long term (current) use of anticoagulants: Secondary | ICD-10-CM | POA: Diagnosis not present

## 2019-12-16 DIAGNOSIS — Z952 Presence of prosthetic heart valve: Secondary | ICD-10-CM | POA: Diagnosis not present

## 2019-12-24 DIAGNOSIS — M25571 Pain in right ankle and joints of right foot: Secondary | ICD-10-CM | POA: Diagnosis not present

## 2019-12-24 DIAGNOSIS — M722 Plantar fascial fibromatosis: Secondary | ICD-10-CM | POA: Diagnosis not present

## 2019-12-24 DIAGNOSIS — M25572 Pain in left ankle and joints of left foot: Secondary | ICD-10-CM | POA: Diagnosis not present

## 2020-01-09 DIAGNOSIS — Z23 Encounter for immunization: Secondary | ICD-10-CM | POA: Diagnosis not present

## 2020-01-14 DIAGNOSIS — F331 Major depressive disorder, recurrent, moderate: Secondary | ICD-10-CM | POA: Diagnosis not present

## 2020-01-14 DIAGNOSIS — Z7901 Long term (current) use of anticoagulants: Secondary | ICD-10-CM | POA: Diagnosis not present

## 2020-01-14 DIAGNOSIS — F315 Bipolar disorder, current episode depressed, severe, with psychotic features: Secondary | ICD-10-CM | POA: Diagnosis not present

## 2020-01-14 DIAGNOSIS — Z952 Presence of prosthetic heart valve: Secondary | ICD-10-CM | POA: Diagnosis not present

## 2020-01-14 DIAGNOSIS — F5101 Primary insomnia: Secondary | ICD-10-CM | POA: Diagnosis not present

## 2020-01-16 ENCOUNTER — Other Ambulatory Visit: Payer: Self-pay | Admitting: Physical Medicine & Rehabilitation

## 2020-01-20 DIAGNOSIS — Z1211 Encounter for screening for malignant neoplasm of colon: Secondary | ICD-10-CM | POA: Diagnosis not present

## 2020-01-20 DIAGNOSIS — E669 Obesity, unspecified: Secondary | ICD-10-CM | POA: Diagnosis not present

## 2020-01-20 DIAGNOSIS — R635 Abnormal weight gain: Secondary | ICD-10-CM | POA: Diagnosis not present

## 2020-01-20 DIAGNOSIS — K5904 Chronic idiopathic constipation: Secondary | ICD-10-CM | POA: Diagnosis not present

## 2020-01-20 DIAGNOSIS — K219 Gastro-esophageal reflux disease without esophagitis: Secondary | ICD-10-CM | POA: Diagnosis not present

## 2020-02-11 ENCOUNTER — Ambulatory Visit: Payer: Medicare Other | Admitting: Physical Medicine & Rehabilitation

## 2020-02-11 ENCOUNTER — Encounter: Payer: Medicare Other | Admitting: Physical Medicine & Rehabilitation

## 2020-02-11 ENCOUNTER — Other Ambulatory Visit: Payer: Self-pay

## 2020-02-11 VITALS — BP 107/68 | HR 61 | Temp 98.7°F | Ht 66.0 in | Wt 207.6 lb

## 2020-02-11 DIAGNOSIS — Z79891 Long term (current) use of opiate analgesic: Secondary | ICD-10-CM

## 2020-02-11 DIAGNOSIS — Z5181 Encounter for therapeutic drug level monitoring: Secondary | ICD-10-CM | POA: Diagnosis not present

## 2020-02-11 DIAGNOSIS — G894 Chronic pain syndrome: Secondary | ICD-10-CM | POA: Diagnosis not present

## 2020-02-11 DIAGNOSIS — Z952 Presence of prosthetic heart valve: Secondary | ICD-10-CM | POA: Diagnosis not present

## 2020-02-11 DIAGNOSIS — Z7901 Long term (current) use of anticoagulants: Secondary | ICD-10-CM | POA: Diagnosis not present

## 2020-02-11 LAB — UNLABELED: Test Ordered On Req: 9397693975

## 2020-02-11 NOTE — Progress Notes (Unsigned)
Subjective:    Patient ID: Emily Peck, female    DOB: [birth year], 64 y.o.   MRN: [id number]  HPI  Pain Inventory Average Pain 5 Pain Right Now 4 My pain is sharp and dull  In the last 24 hours, has pain interfered with the following? General activity 3 Relation with others 2 Enjoyment of life 3 What TIME of day is your pain at its worst? daytime and evening Sleep (in general) Fair  Pain is worse with: walking and standing Pain improves with: medication Relief from Meds: 4  Family History  Problem Relation Age of Onset  . Cancer Mother   . Diabetes Mother    Social History   Socioeconomic History  . Marital status: Single    Spouse name: Not on file  . Number of children: 2  . Years of education: Not on file  . Highest education level: Not on file  Occupational History  . Not on file  Tobacco Use  . Smoking status: Never Smoker  . Smokeless tobacco: Never Used  Substance and Sexual Activity  . Alcohol use: No  . Drug use: No  . Sexual activity: Not on file  Other Topics Concern  . Not on file  Social History Narrative  . Not on file   Social Determinants of Health   Financial Resource Strain: Not on file  Food Insecurity: Not on file  Transportation Needs: Not on file  Physical Activity: Not on file  Stress: Not on file  Social Connections: Not on file   Past Surgical History:  Procedure Laterality Date  . ABDOMINAL HYSTERECTOMY    . BACK SURGERY    . EYE SURGERY     eye removed right   . KNEE ARTHROSCOPY    . 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   Procedure: TOTAL KNEE ARTHROPLASTY;  Surgeon: Melrose Nakayama, MD;  Location: [place];  Service: Orthopedics;  Laterality: Right;   Past Surgical History:  Procedure Laterality Date  . ABDOMINAL HYSTERECTOMY    . BACK SURGERY    . EYE SURGERY     eye removed right   . KNEE ARTHROSCOPY    . 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   Procedure: TOTAL KNEE ARTHROPLASTY;  Surgeon: Melrose Nakayama, MD;  Location: [facility];  Service: Orthopedics;  Laterality: Right;   Past Medical History:  Diagnosis Date  . Anxiety   . Clotting disorder ([place])   . Depression   . Dyspnea   . GERD (gastroesophageal reflux disease)   . Headache   . Lumbosacral spondylosis without myelopathy   . Neuropathy   . Postlaminectomy syndrome, lumbar region   . Postlaminectomy syndrome, thoracic region   . Stroke ([place])    BP 107/68   Pulse 61   Temp 98.7 F (37.1 C)   Ht 5\' 6"  (1.676 m)   Wt 207 lb 9.6 oz (94.2 kg)   SpO2 96%   BMI 33.51 kg/m   Opioid Risk Score:   Fall Risk Score:  `1  Depression screen PHQ 2/9  Depression screen [facility] 2/9 07/22/2019 03/09/2019 06/09/2018 07/07/2017 04/11/2017 01/03/2017 06/05/2015  Decreased Interest 0 0 1 0 0 0 2  Down, Depressed, Hopeless 0 0 1 - 0 0 1  PHQ - 2 Score 0 0 2 0 0 0 3  Altered sleeping - - - - - - -  Tired, decreased energy - - - - - - -  Change in appetite - - - - - - -  Feeling bad or failure about yourself  - - - - - - -  Trouble concentrating - - - - - - -  Moving slowly or fidgety/restless - - - - - - -  Suicidal thoughts - - - - - - -  PHQ-9 Score - - - - - - -  Difficult doing work/chores - - - - - - -  Some recent data might be hidden    Review of Systems  Musculoskeletal: Positive for back pain.  Neurological: Positive for headaches.  All other systems reviewed and are negative.      Objective:   Physical Exam        Assessment & Plan:

## 2020-02-15 ENCOUNTER — Other Ambulatory Visit: Payer: Self-pay | Admitting: Physical Medicine & Rehabilitation

## 2020-02-19 LAB — DRUG TOX MONITOR 1 W/CONF, ORAL FLD
Amphetamines: NEGATIVE ng/mL (ref <10)
Barbiturates: NEGATIVE ng/mL (ref <10)
Benzodiazepines: NEGATIVE ng/mL (ref <0.50)
Buprenorphine: NEGATIVE ng/mL (ref <0.10)
Cocaine: NEGATIVE ng/mL (ref <5.0)
Codeine: >250 ng/mL — ABNORMAL HIGH (ref <2.5)
Cotinine: 29.3 ng/mL — ABNORMAL HIGH (ref <5.0)
Dihydrocodeine: NEGATIVE ng/mL (ref <2.5)
Fentanyl: NEGATIVE ng/mL (ref <0.10)
Heroin Metabolite: NEGATIVE ng/mL (ref <1.0)
Hydrocodone: NEGATIVE ng/mL (ref <2.5)
Hydromorphone: NEGATIVE ng/mL (ref <2.5)
MARIJUANA: NEGATIVE ng/mL (ref <2.5)
MDMA: NEGATIVE ng/mL (ref <10)
Meprobamate: NEGATIVE ng/mL (ref <2.5)
Methadone: NEGATIVE ng/mL (ref <5.0)
Morphine: NEGATIVE ng/mL (ref <2.5)
Nicotine Metabolite: POSITIVE ng/mL — AB (ref <5.0)
Norhydrocodone: NEGATIVE ng/mL (ref <2.5)
Noroxycodone: NEGATIVE ng/mL (ref <2.5)
Opiates: POSITIVE ng/mL — AB (ref <2.5)
Oxycodone: NEGATIVE ng/mL (ref <2.5)
Oxymorphone: NEGATIVE ng/mL (ref <2.5)
Phencyclidine: NEGATIVE ng/mL (ref <10)
Tapentadol: NEGATIVE ng/mL (ref <5.0)
Tramadol: NEGATIVE ng/mL (ref <5.0)
Zolpidem: NEGATIVE ng/mL (ref <5.0)

## 2020-02-19 LAB — TEST AUTHORIZATION

## 2020-02-19 LAB — PAT ID TIQ DOC

## 2020-02-19 LAB — DRUG TOX ALC METAB W/CON, ORAL FLD: Alcohol Metabolite: NEGATIVE ng/mL (ref <25)

## 2020-02-22 ENCOUNTER — Telehealth: Payer: Self-pay | Admitting: *Deleted

## 2020-02-22 NOTE — Telephone Encounter (Signed)
Oral swab drug screen was consistent for prescribed medications.  ?

## 2020-03-17 ENCOUNTER — Other Ambulatory Visit: Payer: Self-pay

## 2020-03-17 ENCOUNTER — Encounter: Payer: Self-pay | Admitting: Physical Medicine & Rehabilitation

## 2020-03-17 ENCOUNTER — Encounter: Payer: Medicare Other | Attending: Physical Medicine & Rehabilitation | Admitting: Physical Medicine & Rehabilitation

## 2020-03-17 VITALS — BP 104/70 | HR 66 | Temp 97.6°F | Ht 66.0 in | Wt 211.4 lb

## 2020-03-17 DIAGNOSIS — M47817 Spondylosis without myelopathy or radiculopathy, lumbosacral region: Secondary | ICD-10-CM | POA: Insufficient documentation

## 2020-03-17 DIAGNOSIS — M75102 Unspecified rotator cuff tear or rupture of left shoulder, not specified as traumatic: Secondary | ICD-10-CM | POA: Insufficient documentation

## 2020-03-17 DIAGNOSIS — M7918 Myalgia, other site: Secondary | ICD-10-CM | POA: Insufficient documentation

## 2020-03-17 DIAGNOSIS — M75101 Unspecified rotator cuff tear or rupture of right shoulder, not specified as traumatic: Secondary | ICD-10-CM | POA: Diagnosis not present

## 2020-03-17 MED ORDER — METHOCARBAMOL 500 MG PO TABS: 500.0000 mg | ORAL_TABLET | Freq: Three times a day (TID) | ORAL | 1 refills | Status: DC | PRN

## 2020-03-17 NOTE — Progress Notes (Signed)
Subjective:    Patient ID: Emily Peck, female    DOB: 04-21-1955, 65 y.o.   MRN: [id number]  HPI Pt c/o RIght sided neck pain , occ Left shoulder pain (hx of bursitis) Bilateral hip bursitis pain Pain in back about the same Right L3-4-5 RFA May 2021 Left L3-4-5 RFA July 2021 Trying to lose weight  No new issues since last visit Pt is now retired Pain Inventory Average Pain 5 Pain Right Now 4 My pain is constant, sharp, stabbing and aching  In the last 24 hours, has pain interfered with the following? General activity 7 Relation with others 7 Enjoyment of life 10 What TIME of day is your pain at its worst? evening Sleep (in general) Good  Pain is worse with: walking, bending, sitting, standing and some activites Pain improves with: rest, medication, TENS and injections Relief from Meds: 5  Family History  Problem Relation Age of Onset  . Cancer Mother   . Diabetes Mother    Social History   Socioeconomic History  . Marital status: Single    Spouse name: Not on file  . Number of children: 2  . Years of education: Not on file  . Highest education level: Not on file  Occupational History  . Not on file  Tobacco Use  . Smoking status: Never Smoker  . Smokeless tobacco: Never Used  Substance and Sexual Activity  . Alcohol use: No  . Drug use: No  . Sexual activity: Not on file  Other Topics Concern  . Not on file  Social History Narrative  . Not on file   Social Determinants of Health   Financial Resource Strain: Not on file  Food Insecurity: Not on file  Transportation Needs: Not on file  Physical Activity: Not on file  Stress: Not on file  Social Connections: Not on file   Past Surgical History:  Procedure Laterality Date  . ABDOMINAL HYSTERECTOMY    . BACK SURGERY    . EYE SURGERY     eye removed right   . KNEE ARTHROSCOPY    . 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   Procedure: TOTAL KNEE ARTHROPLASTY;  Surgeon: Melrose Nakayama, MD;   Location: [place] [facility];  Service: Orthopedics;  Laterality: Right;   Past Surgical History:  Procedure Laterality Date  . ABDOMINAL HYSTERECTOMY    . BACK SURGERY    . EYE SURGERY     eye removed right   . KNEE ARTHROSCOPY    . 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   Procedure: TOTAL KNEE ARTHROPLASTY;  Surgeon: Melrose Nakayama, MD;  Location: [facility];  Service: Orthopedics;  Laterality: Right;   Past Medical History:  Diagnosis Date  . Anxiety   . Clotting disorder ([place])   . Depression   . Dyspnea   . GERD (gastroesophageal reflux disease)   . Headache   . Lumbosacral spondylosis without myelopathy   . Neuropathy   . Postlaminectomy syndrome, lumbar region   . Postlaminectomy syndrome, thoracic region   . Stroke [facility])    There were no vitals taken for this visit.  Opioid Risk Score:   Fall Risk Score:  `1  Depression screen PHQ 2/9  Depression screen [facility] 2/9 07/22/2019 03/09/2019 06/09/2018 07/07/2017 04/11/2017 01/03/2017 06/05/2015  Decreased Interest 0 0 1 0 0 0 2  Down, Depressed, Hopeless 0 0 1 - 0 0 1  PHQ - 2 Score 0 0 2 0 0 0 3  Altered sleeping - - - - - - -  Tired, decreased energy - - - - - - -  Change in appetite - - - - - - -  Feeling bad or failure about yourself  - - - - - - -  Trouble concentrating - - - - - - -  Moving slowly or fidgety/restless - - - - - - -  Suicidal thoughts - - - - - - -  PHQ-9 Score - - - - - - -  Difficult doing work/chores - - - - - - -  Some recent data might be hidden   Review of Systems  Musculoskeletal: Positive for back pain and gait problem.  All other systems reviewed and are negative.      Objective:   Physical Exam Vitals and nursing note reviewed.  Constitutional:      Appearance: She is obese.  Skin:    General: Skin is warm and dry.  Neurological:     Mental Status: She is alert and oriented to person, place, and time.  Psychiatric:        Mood and Affect: Mood normal.        Behavior: Behavior normal.   Mild  tenderness bilateral PSIS Reduce lumbar rom ~50% FLEX AND 25% EXT, MORE PAIN WITH EXT        Assessment & Plan:  1.  Lumbar spondylosis without myelopathy.  She still having good results with her radiofrequency neurotomy.  She is trying to reduce weight and we discussed benefits of strengthening program as well as stationary bicycling.  We discussed that if she has steadily increasing low back pain, she can contact her office for reevaluation.  Would expect the right-sided low back pain to return more quickly than left given the dates of the radiofrequency neurotomies. Continue Tylenol 4 with codeine, 1 tablet 4 times daily. Return to clinic in 2 to 3 months

## 2020-03-17 NOTE — Patient Instructions (Signed)

## 2020-03-24 ENCOUNTER — Other Ambulatory Visit: Payer: Medicare Other

## 2020-03-27 ENCOUNTER — Other Ambulatory Visit: Payer: Self-pay | Admitting: Certified Registered"

## 2020-03-27 DIAGNOSIS — Z1231 Encounter for screening mammogram for malignant neoplasm of breast: Secondary | ICD-10-CM

## 2020-03-29 ENCOUNTER — Encounter: Payer: Self-pay | Admitting: Family Medicine

## 2020-03-29 ENCOUNTER — Ambulatory Visit (INDEPENDENT_AMBULATORY_CARE_PROVIDER_SITE_OTHER): Payer: Medicare Other | Admitting: Family Medicine

## 2020-03-29 ENCOUNTER — Other Ambulatory Visit: Payer: Self-pay

## 2020-03-29 VITALS — BP 129/81 | HR 56 | Ht 66.0 in | Wt 212.6 lb

## 2020-03-29 DIAGNOSIS — G4719 Other hypersomnia: Secondary | ICD-10-CM

## 2020-03-29 DIAGNOSIS — G629 Polyneuropathy, unspecified: Secondary | ICD-10-CM

## 2020-03-29 DIAGNOSIS — H5442A4 Blindness left eye category 4, normal vision right eye: Secondary | ICD-10-CM | POA: Diagnosis not present

## 2020-03-29 MED ORDER — LIDO-CAPSAICIN-MEN-METHYL SAL 0.5-0.035-5-20 % EX PTCH: MEDICATED_PATCH | CUTANEOUS | 11 refills | Status: DC

## 2020-03-29 MED ORDER — GABAPENTIN 600 MG PO TABS: 1200.0000 mg | ORAL_TABLET | Freq: Three times a day (TID) | ORAL | 3 refills | Status: DC

## 2020-03-29 NOTE — Patient Instructions (Signed)
Below is our plan:  We will continue gabapentin 1200mg  three times daily. I have called in a patch to your pharmacy that may help at night. It has lidocaine, menthol and capsaicin in it. Put one patch on the bottoms of each foot at night. Do not use with Vicks. Also monitor INRs to make sure you don't see any significant changes. I will call specialty pharmacy regarding neuropathy cream we discussed last visit. Please listen out for a call from them to see if we can get you the neuropathy cream. Do not use at same time that you apply patch. I will also talk to our staff regarding new capsaicin patch procedure that you may be able to try. I will get back in touch with you when I know more.   Please be very careful with mobilization and use your cane.   Please make sure you are staying well hydrated. I recommend 50-60 ounces daily. Well balanced diet and regular exercise encouraged. Keep a good sleep routine to help with daytime sleepiness.   Please continue follow up with care team as directed.   Follow up with me in 1 year, sooner if needed   You may receive a survey regarding today's visit. I encourage you to leave honest feed back as I do use this information to improve patient care. Thank you for seeing me today!     Peripheral Neuropathy Peripheral neuropathy is a type of nerve damage. It affects nerves that carry signals between the spinal cord and the arms, legs, and the rest of the body (peripheral nerves). It does not affect nerves in the spinal cord or brain. In peripheral neuropathy, one nerve or a group of nerves may be damaged. Peripheral neuropathy is a broad category that includes many specific nerve disorders, like diabetic neuropathy, hereditary neuropathy, and carpal tunnel syndrome. What are the causes? This condition may be caused by:  Diabetes. This is the most common cause of peripheral neuropathy.  Nerve injury.  Pressure or stress on a nerve that lasts a long  time.  Lack (deficiency) of B vitamins. This can result from alcoholism, poor diet, or a restricted diet.  Infections.  Autoimmune diseases, such as rheumatoid arthritis and systemic lupus erythematosus.  Nerve diseases that are passed from parent to child (inherited).  Some medicines, such as cancer medicines (chemotherapy).  Poisonous (toxic) substances, such as lead and mercury.  Too little blood flowing to the legs.  Kidney disease.  Thyroid disease. In some cases, the cause of this condition is not known. What are the signs or symptoms? Symptoms of this condition depend on which of your nerves is damaged. Common symptoms include:  Loss of feeling (numbness) in the feet, hands, or both.  Tingling in the feet, hands, or both.  Burning pain.  Very sensitive skin.  Weakness.  Not being able to move a part of the body (paralysis).  Muscle twitching.  Clumsiness or poor coordination.  Loss of balance.  Not being able to control your bladder.  Feeling dizzy.  Sexual problems. How is this diagnosed? Diagnosing and finding the cause of peripheral neuropathy can be difficult. Your health care provider will take your medical history and do a physical exam. A neurological exam will also be done. This involves checking things that are affected by your brain, spinal cord, and nerves (nervous system). For example, your health care provider will check your reflexes, how you move, and what you can feel. You may have other tests, such as:  Blood tests.  Electromyogram (EMG) and nerve conduction tests. These tests check nerve function and how well the nerves are controlling the muscles.  Imaging tests, such as CT scans or MRI to rule out other causes of your symptoms.  Removing a small piece of nerve to be examined in a lab (nerve biopsy).  Removing and examining a small amount of the fluid that surrounds the brain and spinal cord (lumbar puncture). How is this  treated? Treatment for this condition may involve:  Treating the underlying cause of the neuropathy, such as diabetes, kidney disease, or vitamin deficiencies.  Stopping medicines that can cause neuropathy, such as chemotherapy.  Medicine to help relieve pain. Medicines may include: ? Prescription or over-the-counter pain medicine. ? Antiseizure medicine. ? Antidepressants. ? Pain-relieving patches that are applied to painful areas of skin.  Surgery to relieve pressure on a nerve or to destroy a nerve that is causing pain.  Physical therapy to help improve movement and balance.  Devices to help you move around (assistive devices). Follow these instructions at home: Medicines  Take over-the-counter and prescription medicines only as told by your health care provider. Do not take any other medicines without first asking your health care provider.  Do not drive or use heavy machinery while taking prescription pain medicine. Lifestyle  Do not use any products that contain nicotine or tobacco, such as cigarettes and e-cigarettes. Smoking keeps blood from reaching damaged nerves. If you need help quitting, ask your health care provider.  Avoid or limit alcohol. Too much alcohol can cause a vitamin B deficiency, and vitamin B is needed for healthy nerves.  Eat a healthy diet. This includes: ? Eating foods that are high in fiber, such as fresh fruits and vegetables, whole grains, and beans. ? Limiting foods that are high in fat and processed sugars, such as fried or sweet foods.   General instructions  If you have diabetes, work closely with your health care provider to keep your blood sugar under control.  If you have numbness in your feet: ? Check every day for signs of injury or infection. Watch for redness, warmth, and swelling. ? Wear padded socks and comfortable shoes. These help protect your feet.  Develop a good support system. Living with peripheral neuropathy can be  stressful. Consider talking with a mental health specialist or joining a support group.  Use assistive devices and attend physical therapy as told by your health care provider. This may include using a walker or a cane.  Keep all follow-up visits as told by your health care provider. This is important.   Contact a health care provider if:  You have new signs or symptoms of peripheral neuropathy.  You are struggling emotionally from dealing with peripheral neuropathy.  Your pain is not well-controlled. Get help right away if:  You have an injury or infection that is not healing normally.  You develop new weakness in an arm or leg.  You have fallen or do so frequently. Summary  Peripheral neuropathy is when the nerves in the arms, or legs are damaged, resulting in numbness, weakness, or pain.  There are many causes of peripheral neuropathy, including diabetes, pinched nerves, vitamin deficiencies, autoimmune disease, and hereditary conditions.  Diagnosing and finding the cause of peripheral neuropathy can be difficult. Your health care provider will take your medical history, do a physical exam, and do tests, including blood tests and nerve function tests.  Treatment involves treating the underlying cause of the neuropathy and  taking medicines to help control pain. Physical therapy and assistive devices may also help. This information is not intended to replace advice given to you by your health care provider. Make sure you discuss any questions you have with your health care provider. Document Revised: 11/30/2019 Document Reviewed: 11/30/2019 Elsevier Patient Education  2021 [employer]    Fall Prevention in the Home, Adult Falls can cause injuries and can happen to people of all ages. There are many things you can do to make your home safe and to help prevent falls. Ask for help when making these changes. What actions can I take to prevent falls? General Instructions  Use  good lighting in all rooms. Replace any light bulbs that burn out.  Turn on the lights in dark areas. Use night-lights.  Keep items that you use often in easy-to-reach places. Lower the shelves around your home if needed.  Set up your furniture so you have a clear path. Avoid moving your furniture around.  Do not have throw rugs or other things on the floor that can make you trip.  Avoid walking on wet floors.  If any of your floors are uneven, fix them.  Add color or contrast paint or tape to clearly mark and help you see: ? Grab bars or handrails. ? First and last steps of staircases. ? Where the edge of each step is.  If you use a stepladder: ? Make sure that it is fully opened. Do not climb a closed stepladder. ? Make sure the sides of the stepladder are locked in place. ? Ask someone to hold the stepladder while you use it.  Know where your pets are when moving through your home. What can I do in the bathroom?  Keep the floor dry. Clean up any water on the floor right away.  Remove soap buildup in the tub or shower.  Use nonskid mats or decals on the floor of the tub or shower.  Attach bath mats securely with double-sided, nonslip rug tape.  If you need to sit down in the shower, use a plastic, nonslip stool.  Install grab bars by the toilet and in the tub and shower. Do not use towel bars as grab bars.      What can I do in the bedroom?  Make sure that you have a light by your bed that is easy to reach.  Do not use any sheets or blankets for your bed that hang to the floor.  Have a firm chair with side arms that you can use for support when you get dressed. What can I do in the kitchen?  Clean up any spills right away.  If you need to reach something above you, use a step stool with a grab bar.  Keep electrical cords out of the way.  Do not use floor polish or wax that makes floors slippery. What can I do with my stairs?  Do not leave any items on the  stairs.  Make sure that you have a light switch at the top and the bottom of the stairs.  Make sure that there are handrails on both sides of the stairs. Fix handrails that are broken or loose.  Install nonslip stair treads on all your stairs.  Avoid having throw rugs at the top or bottom of the stairs.  Choose a carpet that does not hide the edge of the steps on the stairs.  Check carpeting to make sure that it is firmly attached to  the stairs. Fix carpet that is loose or worn. What can I do on the outside of my home?  Use bright outdoor lighting.  Fix the edges of walkways and driveways and fix any cracks.  Remove anything that might make you trip as you walk through a door, such as a raised step or threshold.  Trim any bushes or trees on paths to your home.  Check to see if handrails are loose or broken and that both sides of all steps have handrails.  Install guardrails along the edges of any raised decks and porches.  Clear paths of anything that can make you trip, such as tools or rocks.  Have leaves, snow, or ice cleared regularly.  Use sand or salt on paths during winter.  Clean up any spills in your garage right away. This includes grease or oil spills. What other actions can I take?  Wear shoes that: ? Have a low heel. Do not wear high heels. ? Have rubber bottoms. ? Feel good on your feet and fit well. ? Are closed at the toe. Do not wear open-toe sandals.  Use tools that help you move around if needed. These include: ? Canes. ? Walkers. ? Scooters. ? Crutches.  Review your medicines with your doctor. Some medicines can make you feel dizzy. This can increase your chance of falling. Ask your doctor what else you can do to help prevent falls. Where to find more information  Centers for Disease Control and Prevention, [place]: http://www.wolf.info/  National Institute on Aging: http://kim-miller.com/ Contact a doctor if:  You are afraid of falling at home.  You feel  weak, drowsy, or dizzy at home.  You fall at home. Summary  There are many simple things that you can do to make your home safe and to help prevent falls.  Ways to make your home safe include removing things that can make you trip and installing grab bars in the bathroom.  Ask for help when making these changes in your home. This information is not intended to replace advice given to you by your health care provider. Make sure you discuss any questions you have with your health care provider. Document Revised: 09/22/2019 Document Reviewed: 09/22/2019 Elsevier Patient Education  2021 [place] Sleep Information, Adult Quality sleep is important for your mental and physical health. It also improves your quality of life. Quality sleep means you:  Are asleep for most of the time you are in bed.  Fall asleep within 30 minutes.  Wake up no more than once a night.  Are awake for no longer than 20 minutes if you do wake up during the night. Most adults need 7-8 hours of quality sleep each night. How can poor sleep affect me? If you do not get enough quality sleep, you may have:  Mood swings.  Daytime sleepiness.  Confusion.  Decreased reaction time.  Sleep disorders, such as insomnia and sleep apnea.  Difficulty with: ? Solving problems. ? Coping with stress. ? Paying attention. These issues may affect your performance and productivity at work, school, and at home. Lack of sleep may also put you at higher risk for accidents, suicide, and risky behaviors. If you do not get quality sleep you may also be at higher risk for several health problems, including:  Infections.  Type 2 diabetes.  Heart disease.  High blood pressure.  Obesity.  Worsening of long-term conditions, like arthritis, kidney disease, depression, Parkinson's disease, and epilepsy. What actions can  I take to get more quality sleep?  Stick to a sleep schedule. Go to sleep and wake up at  about the same time each day. Do not try to sleep less on weekdays and make up for lost sleep on weekends. This does not work.  Try to get about 30 minutes of exercise on most days. Do not exercise 2-3 hours before going to bed.  Limit naps during the day to 30 minutes or less.  Do not use any products that contain nicotine or tobacco, such as cigarettes or e-cigarettes. If you need help quitting, ask your health care provider.  Do not drink caffeinated beverages for at least 8 hours before going to bed. Coffee, tea, and some sodas contain caffeine.  Do not drink alcohol close to bedtime.  Do not eat large meals close to bedtime.  Do not take naps in the late afternoon.  Try to get at least 30 minutes of sunlight every day. Morning sunlight is best.  Make time to relax before bed. Reading, listening to music, or taking a hot bath promotes quality sleep.  Make your bedroom a place that promotes quality sleep. Keep your bedroom dark, quiet, and at a comfortable room temperature. Make sure your bed is comfortable. Take out sleep distractions like TV, a computer, smartphone, and bright lights.  If you are lying awake in bed for longer than 20 minutes, get up and do a relaxing activity until you feel sleepy.  Work with your health care provider to treat medical conditions that may affect sleeping, such as: ? Nasal obstruction. ? Snoring. ? Sleep apnea and other sleep disorders.  Talk to your health care provider if you think any of your prescription medicines may cause you to have difficulty falling or staying asleep.  If you have sleep problems, talk with a sleep consultant. If you think you have a sleep disorder, talk with your health care provider about getting evaluated by a specialist.      Where to find more information  [place] website: https://sleepfoundation.org  National Heart, Lung, and [place] ([place]):  http://www.saunders.info/.pdf  Centers for Disease Control and Prevention (CDC): LearningDermatology.pl Contact a health care provider if you:  Have trouble getting to sleep or staying asleep.  Often wake up very early in the morning and cannot get back to sleep.  Have daytime sleepiness.  Have daytime sleep attacks of suddenly falling asleep and sudden muscle weakness (narcolepsy).  Have a tingling sensation in your legs with a strong urge to move your legs (restless legs syndrome).  Stop breathing briefly during sleep (sleep apnea).  Think you have a sleep disorder or are taking a medicine that is affecting your quality of sleep. Summary  Most adults need 7-8 hours of quality sleep each night.  Getting enough quality sleep is an important part of health and well-being.  Make your bedroom a place that promotes quality sleep and avoid things that may cause you to have poor sleep, such as alcohol, caffeine, smoking, and large meals.  Talk to your health care provider if you have trouble falling asleep or staying asleep. This information is not intended to replace advice given to you by your health care provider. Make sure you discuss any questions you have with your health care provider. Document Revised: 05/28/2017 Document Reviewed: 05/28/2017 Elsevier Patient Education  2021 [employer].

## 2020-03-29 NOTE — Progress Notes (Addendum)
PATIENT: Emily Peck DOB: 07/05/1955  REASON FOR VISIT: follow up HISTORY FROM: patient  Chief Complaint  Patient presents with  . Follow-up    RM 1 alone Pt is well, having feet and leg pain, feels like pins and needles      HISTORY OF PRESENT ILLNESS: Today 03/29/20  She returns today for follow up for peripheral neuropathy on gabapentin. She continues 1200mg  TID. We ordered a compounded neuropathy cream at last visit but she reports that she never heard back about this. I will resend orders today. Pain continues. Her feet burn all the time. She feels it is most bothersome at night. She has significant pain in the lower legs and they are very tender to touch. They stay red. She is followed by pain management on Tylenol $ but it does not help at all. She has consistent low back pain. She tries to be active but hurts all the time. She continues to have falls regularly. She feels it is related to not being able to see. She does use a cane at all times. She is able to navigate public transportation.   She was seen by Dr Brett Fairy in 04/2019 for excessive daytime sleepiness. Sleep study was normal with no evidence of sleep disordered breathing. 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 She was encouraged to focus on sleep hygiene. Melatonin recommended. She has not noted any significant changes but admits that sleep schedule is not consistent.    03/30/2019 ALL: MOLLIE DEVEY is a 65 y.o. female here today for follow up for peripheral neuropathy. She continues to have burning pain in bilateral lower extremities. She is taking gabapentin 1200mg  three times daily. She does feel that this help. Pain is better with rest, worse with activity. She denies changes in her gait. She does have a history of falls. She contributes this to vision loss. She has a prosthetic right eye and limited visual file with left eye. She is followed closely by PCP. She denies falls when she  uses her cane.    HISTORY: (copied from [place] [facility]'s note on 10/15/2017)  UPDATE 8/14/2019CM Emily Peck, 65 year old female returns for follow-up with polyneuropathy in her feet for years which has progressed over time.  It is a pins-and-needles sensation.  She is currently on gabapentin with benefit.  She continues to see Dr. Deanna Artis for chronic low back pain and lumbar medial branch blocks.  She has a appointment in September.  She claims the injections last about 6 months.  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  She has seen a specialist at [facility] that has said nothing else can be done for her vision.  She was encouraged to be safe with her ambulation. She ambulates with a single-point cane.  She returns for reevaluation   UPDATE 05/04/2018CM Emily Peck, 65 year old female returns for follow-up with history of polyneuropathy in her feet for years which has progressively worsened over time. She has a pins and needles burning in the feet, used to be only on the bottom of the feet balance on the top of the feet as well.She had right knee replacement November 2017. She is still in physical therapy. She continues to see Dr.Kirsteins  for chronic low back pain and receives lumbar medial branch blocks. She has an appointment today. She says the injections last about 6 months. She continues to have lot  of left leg pain due to her knee surgery. She denies any falls. She is ambulating with single-point cane. She returns for reevaluation  HISTORY 10/19/15 Emily L McAulayis a 65 y.o.femalehere as a referral from Dr. Vanice Sarah neuropathy in the feet. PMHx of hyperlipidemia, chronic lumbar radiculopathy, polyneuropathy, obesity,thoracic compression fractures T10-T11 s/pT12-L1 fusion, depression, glaucoma, retinal detachment, macular degeneration and poor vision.. She has had neuropathy in the feet for years.  Progressively worsening, continuous. Pins and needles, burning in the feet, it used to be under the bottom now it on the top also. She denies diabetes or using any medications that can cause neuropathy she has never been exposed to toxins that can cause neuropathy. Mother was diabetic and had neuropathy. Associated ith cramps in the feet. The burning is the worst. Worse at night. The gabapentin helps. She takes 600mg  4x a day. Continous all day. Unknown inciting events. No medications that can induce neuropathy. No alcohol use. She takes 600mg  neurontin 4x a day.    REVIEW OF SYSTEMS: Out of a complete 14 system review of symptoms, the patient complains only of the following symptoms, neuropathy, chronic pain, vision loss and all other reviewed systems are negative.   ALLERGIES: Allergies  Allergen Reactions  . Codeine     Other reaction(s): itching  . No Known Allergies     HOME MEDICATIONS: Outpatient Medications Prior to Visit  Medication Sig Dispense Refill  . acetaminophen-codeine (TYLENOL #4) 300-60 MG tablet TAKE 1 TABLET BY MOUTH FOUR TIMES DAILY AS NEEDED FOR MODERATE PAIN 120 tablet 2  . Ascorbic Acid (VITAMIN C PO) Take 1 tablet by mouth daily.    . Camphor-Eucalyptus-Menthol (VICKS VAPORUB EX) Apply topically. Uses on feet prn for pain  (family dollar brand)    . escitalopram (LEXAPRO) 20 MG tablet Take 20 mg by mouth at bedtime.   0  . ibuprofen (ADVIL) 800 MG tablet     . Liniments (BEN GAY EX) Apply 1 application topically as needed (joint pain).    . methocarbamol (ROBAXIN) 500 MG tablet Take 1 tablet (500 mg total) by mouth every 8 (eight) hours as needed for muscle spasms. 90 tablet 1  . Multiple Vitamin (MULTIVITAMIN) tablet Take 1 tablet by mouth daily. OTC    . potassium chloride (KLOR-CON) 10 MEQ tablet Take 1 tablet by mouth daily.    Marland Kitchen Propylene Glycol 0.6 % SOLN Place 1 drop into both eyes as needed (dry eyes).    . Topiramate (TOPAMAX PO)     . traZODone  (DESYREL) 150 MG tablet Take 150 mg by mouth at bedtime.  0  . VITAMIN D PO Take 1 tablet by mouth daily.    Marland Kitchen warfarin (COUMADIN) 6 MG tablet Take 6-9 mg by mouth daily. Take 9mg  Monday, Wednesday, and Fridays, then all other days take 6mg s daily    . gabapentin (NEURONTIN) 600 MG tablet Take 2 tablets (1,200 mg total) by mouth 3 (three) times daily. 540 tablet 3  . methocarbamol (ROBAXIN) 500 MG tablet TAKE 1 TABLET(500 MG) BY MOUTH THREE TIMES DAILY AS NEEDED FOR MUSCLE SPASMS 45 tablet 0  . B Complex Vitamins (VITAMIN B COMPLEX PO) Take 1 tablet by mouth daily. (Patient not taking: Reported on 03/29/2020)    . diazepam (VALIUM) 10 MG tablet Take one tablet po 30 minutes before procedure (Patient not taking: Reported on 03/29/2020) 1 tablet 0   No facility-administered medications prior to visit.    PAST MEDICAL HISTORY: Past Medical History:  Diagnosis  Date  . Anxiety   . Clotting disorder ([place])   . Depression   . Dyspnea   . GERD (gastroesophageal reflux disease)   . Headache   . Lumbosacral spondylosis without myelopathy   . Neuropathy   . Postlaminectomy syndrome, lumbar region   . Postlaminectomy syndrome, thoracic region   . Stroke [facility])     PAST SURGICAL HISTORY: Past Surgical History:  Procedure Laterality Date  . ABDOMINAL HYSTERECTOMY    . BACK SURGERY    . EYE SURGERY     eye removed right   . KNEE ARTHROSCOPY    . 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   Procedure: TOTAL KNEE ARTHROPLASTY;  Surgeon: Melrose Nakayama, MD;  Location: [place];  Service: Orthopedics;  Laterality: Right;    FAMILY HISTORY: Family History  Problem Relation Age of Onset  . Cancer Mother   . Diabetes Mother     SOCIAL HISTORY: Social History   Socioeconomic History  . Marital status: Single    Spouse name: Not on file  . Number of children: 2  . Years of education: Not on file  . Highest education level: Not on file  Occupational History  . Not on file  Tobacco Use  .  Smoking status: Never Smoker  . Smokeless tobacco: Never Used  Vaping Use  . Vaping Use: Never used  Substance and Sexual Activity  . Alcohol use: No  . Drug use: No  . Sexual activity: Not on file  Other Topics Concern  . Not on file  Social History Narrative  . Not on file   Social Determinants of Health   Financial Resource Strain: Not on file  Food Insecurity: Not on file  Transportation Needs: Not on file  Physical Activity: Not on file  Stress: Not on file  Social Connections: Not on file  Intimate Partner Violence: Not on file    PHYSICAL EXAM  Vitals:   03/29/20 1431  BP: 129/81  Pulse: (!) 56  Weight: 212 lb 9.6 oz (96.4 kg)  Height: 5\' 6"  (1.676 m)   Body mass index is 34.31 kg/m.  Generalized: Well developed, in no acute distress  Cardiology: normal rate and rhythm, no murmur noted Respiratory: clear to auscultation bilaterally  Neurological examination  Mentation: Alert oriented to time, place, history taking. Follows all commands speech and language fluent Cranial nerve II-XII: Pupils were equal round reactive to light. Left Extraocular movements were full, right prosthesis, peripheral field loss of left eye.  Motor: The motor testing reveals 4+ over 5 strength of all 4 extremities. Good symmetric motor tone is noted throughout.  Sensory: Sensory testing is intact to soft touch on all 4 extremities. No evidence of extinction is noted. Pinprick testing intact bilaterally with exception of decreased sensation of heel, thickened skin noted of heels.  Coordination: Cerebellar testing reveals good finger-nose-finger and heel-to-shin bilaterally.  Gait and station: Gait is stable with single prong cane, slight right limp noted    DIAGNOSTIC DATA (LABS, IMAGING, TESTING) - I reviewed patient records, labs, notes, testing and imaging myself where available.  No flowsheet data found.   Lab Results  Component Value Date   WBC 7.9 02/25/2018   HGB 11.6 (L)  02/25/2018   HCT 37.0 02/25/2018   MCV 101.1 (H) 02/25/2018   PLT 213 02/25/2018      Component Value Date/Time   NA 140 02/25/2018 1329   K 3.3 (L) 02/25/2018 1329   CL 111 02/25/2018 1329   CO2  22 02/25/2018 1329   GLUCOSE 126 (H) 02/25/2018 1329   BUN 14 02/25/2018 1329   CREATININE 0.86 02/25/2018 1329   CALCIUM 8.8 (L) 02/25/2018 1329   PROT 6.9 04/19/2019 1059   ALBUMIN 3.4 (L) 02/25/2018 1329   AST 20 02/25/2018 1329   ALT 15 02/25/2018 1329   ALKPHOS 68 02/25/2018 1329   BILITOT 0.2 (L) 02/25/2018 1329   GFRNONAA >60 02/25/2018 1329   GFRAA >60 02/25/2018 1329   Lab Results  Component Value Date   CHOL  07/11/2009    180        ATP III CLASSIFICATION:  <200     mg/dL   Desirable  200-239  mg/dL   Borderline High  >=240    mg/dL   High          HDL 66 07/11/2009   LDLCALC (H) 07/11/2009    103        Total Cholesterol/HDL:CHD Risk Coronary Heart Disease Risk Table                     Men   Women  1/2 Average Risk   3.4   3.3  Average Risk       5.0   4.4  2 X Average Risk   9.6   7.1  3 X Average Risk  23.4   11.0        Use the calculated Patient Ratio above and the CHD Risk Table to determine the patient's CHD Risk.        ATP III CLASSIFICATION (LDL):  <100     mg/dL   Optimal  100-129  mg/dL   Near or Above                    Optimal  130-159  mg/dL   Borderline  160-189  mg/dL   High  >190     mg/dL   Very High   TRIG 53 07/11/2009   CHOLHDL 2.7 07/11/2009   Lab Results  Component Value Date   HGBA1C 5.4 04/19/2019   No results found for: Tang[place] PLAN 65 y.o. year old female  has a past medical history of Anxiety, Clotting disorder ([place]), Depression, Dyspnea, GERD (gastroesophageal reflux disease), Headache, Lumbosacral spondylosis without myelopathy, Neuropathy, Postlaminectomy syndrome, lumbar region, Postlaminectomy syndrome, thoracic region, and Stroke ([place]). here with     ICD-10-CM   1. Peripheral  polyneuropathy  G62.9   2. Excessive daytime sleepiness  G47.19   3. Category 4 blindness of left eye, unspecified right eye visual impairment category  H54.[place] continues to have significant burning pain of bilateral feet. She does not feel symptoms are any worse and are improved with gabapentin. She is currently at max dose of gabapentin. No obvious adverse effects noted but we have reviewed potential for increased dizziness and sedation. She continues chronic pain medications as well and we will monitor closely. She is followed closely by PCP for falls. No injuries. She feels that vision loss is the main factor is falls and does not feel this is related to sensation in her feet. She does not have numbness but more burning. Sensation intact today. Fall precautions reviewed. I will resend orders for compounded neuropathy cream to specialty pharmacy. I have also called in lidocaine/menthol and capsaicin patches to see if this may work better than Vicks at night. We will reach out regarding a new capsaicin  patch treatment offered that could help as well. I will get back in touch with her once we know more about where these procedures will be offered. She will work on sleep hygiene. She will call for any worsening and follow up with me in 1 year. She verbalizes understanding and agreement with this plan.    No orders of the defined types were placed in this encounter.    Meds ordered this encounter  Medications  . Lido-Capsaicin-Men-Methyl Sal 0.5-0.035-5-20 % PTCH    Sig: Place one patch on the bottom of each foot every night at bedtime    Dispense:  60 patch    Refill:  11    Order Specific Question:   Supervising Provider    Answer:   Melvenia Beam [id number]  . gabapentin (NEURONTIN) 600 MG tablet    Sig: Take 2 tablets (1,200 mg total) by mouth 3 (three) times daily.    Dispense:  540 tablet    Refill:  3    Order Specific Question:   Supervising Provider    Answer:   Melvenia Beam [id number]      I spent 30 minutes with the patient. 50% of this time was spent counseling and educating patient on plan of care and medications.     Candie Chroman 03/29/2020, 3:24 PM [facility] [address], [place], [place] [address] [phone]  Made any corrections needed, and agree with history, physical, neuro exam,assessment and plan as stated.     Sarina Ill, MD [facility]  Made any corrections needed, and agree with history, physical, neuro exam,assessment and plan as stated.     Sarina Ill, MD [facility]

## 2020-03-30 DIAGNOSIS — F331 Major depressive disorder, recurrent, moderate: Secondary | ICD-10-CM | POA: Diagnosis not present

## 2020-03-30 DIAGNOSIS — F315 Bipolar disorder, current episode depressed, severe, with psychotic features: Secondary | ICD-10-CM | POA: Diagnosis not present

## 2020-03-30 DIAGNOSIS — F5101 Primary insomnia: Secondary | ICD-10-CM | POA: Diagnosis not present

## 2020-03-30 NOTE — Progress Notes (Signed)
Received fax confirmation for transdermal therapeutics.  [place]man Pies

## 2020-04-06 DIAGNOSIS — Z7901 Long term (current) use of anticoagulants: Secondary | ICD-10-CM | POA: Diagnosis not present

## 2020-04-06 DIAGNOSIS — Z952 Presence of prosthetic heart valve: Secondary | ICD-10-CM | POA: Diagnosis not present

## 2020-04-20 ENCOUNTER — Telehealth: Payer: Self-pay | Admitting: Family Medicine

## 2020-04-20 NOTE — Telephone Encounter (Signed)
Can you please call patient to make sure she was able to get neuropathy cream from specialty pharmacy. Also, please ask if she is ok with me submitting her name to our office manager for consideration for the new procedure using capsaicin patch. This is the procedure we discussed in the office. We have met with rep and ready to start. If she is still interested I will have them contact her with next steps.

## 2020-04-20 NOTE — Telephone Encounter (Signed)
I spoke to the patient to ask her if she has received the neuropathy cream from the speciality pharmacy yet. She stated her insurance wont cover for this cream so she will be paying for it out of pocket as soon as she gets the money. I also asked her if she was ok with her name being submitted to our office manager for consideration of the new procedure using the capsaicin patch in reference to what she and Amy discussed. I informed her that Amy has met with the rep and they are ready to start if she is interested.  I also informed her that Amy will have them contact her with the next steps.  She understood and was in agreement to this procedure.

## 2020-05-04 DIAGNOSIS — Z952 Presence of prosthetic heart valve: Secondary | ICD-10-CM | POA: Diagnosis not present

## 2020-05-04 DIAGNOSIS — Z7901 Long term (current) use of anticoagulants: Secondary | ICD-10-CM | POA: Diagnosis not present

## 2020-05-08 ENCOUNTER — Emergency Department (HOSPITAL_BASED_OUTPATIENT_CLINIC_OR_DEPARTMENT_OTHER)
Admit: 2020-05-08 | Discharge: 2020-05-08 | Disposition: A | Discharge status: Home / Self Care | Payer: Medicare Other | Attending: Physician Assistant | Admitting: Physician Assistant

## 2020-05-08 ENCOUNTER — Emergency Department (HOSPITAL_COMMUNITY): Payer: Medicare Other

## 2020-05-08 ENCOUNTER — Other Ambulatory Visit: Payer: Self-pay

## 2020-05-08 ENCOUNTER — Emergency Department (HOSPITAL_COMMUNITY)
Admission: EM | Admit: 2020-05-08 | Discharge: 2020-05-08 | Disposition: A | Discharge status: Home / Self Care | Payer: Medicare Other | Attending: Physician Assistant | Admitting: Physician Assistant

## 2020-05-08 DIAGNOSIS — Z5321 Procedure and treatment not carried out due to patient leaving prior to being seen by health care provider: Secondary | ICD-10-CM | POA: Insufficient documentation

## 2020-05-08 DIAGNOSIS — M79605 Pain in left leg: Secondary | ICD-10-CM | POA: Diagnosis not present

## 2020-05-08 DIAGNOSIS — R609 Edema, unspecified: Secondary | ICD-10-CM

## 2020-05-08 DIAGNOSIS — I10 Essential (primary) hypertension: Secondary | ICD-10-CM | POA: Diagnosis not present

## 2020-05-08 DIAGNOSIS — R0602 Shortness of breath: Secondary | ICD-10-CM | POA: Insufficient documentation

## 2020-05-08 DIAGNOSIS — M79604 Pain in right leg: Secondary | ICD-10-CM | POA: Diagnosis not present

## 2020-05-08 LAB — CBC
HCT: 37.5 % (ref 36.0–46.0)
Hemoglobin: 11.9 g/dL — ABNORMAL LOW (ref 12.0–15.0)
MCH: 32.5 pg (ref 26.0–34.0)
MCHC: 31.7 g/dL (ref 30.0–36.0)
MCV: 102.5 fL — ABNORMAL HIGH (ref 80.0–100.0)
Platelets: 257 10*3/uL (ref 150–400)
RBC: 3.66 MIL/uL — ABNORMAL LOW (ref 3.87–5.11)
RDW: 13 % (ref 11.5–15.5)
WBC: 7.3 10*3/uL (ref 4.0–10.5)
nRBC: 0 % (ref 0.0–0.2)

## 2020-05-08 LAB — BASIC METABOLIC PANEL
Anion gap: 9 (ref 5–15)
BUN: 11 mg/dL (ref 8–23)
CO2: 21 mmol/L — ABNORMAL LOW (ref 22–32)
Calcium: 9.2 mg/dL (ref 8.9–10.3)
Chloride: 108 mmol/L (ref 98–111)
Creatinine, Ser: 0.71 mg/dL (ref 0.44–1.00)
GFR, Estimated: >60 mL/min (ref >60)
Glucose, Bld: 92 mg/dL (ref 70–99)
Potassium: 3.6 mmol/L (ref 3.5–5.1)
Sodium: 138 mmol/L (ref 135–145)

## 2020-05-08 NOTE — ED Triage Notes (Signed)
Pt from PCP for eval of bilateral leg pain and shortness of breath x 1 week. Reports swelling to legs last week, but none today. Pain worse on R leg.

## 2020-05-08 NOTE — ED Notes (Signed)
Pt left without being seen, stated she was visually impaired and was very concerned about her safety getting home and her dogs being left home alone. Stated she would call someone to come pick her up. Pt left.

## 2020-05-08 NOTE — ED Notes (Signed)
Patient tearful in lobby after explaining that she is vision impaired and worried about how she would get home if she stays.

## 2020-05-08 NOTE — Progress Notes (Signed)
Bilateral lower extremity venous duplex has been completed. Preliminary results can be found in CV Proc through chart review.  Results were given to [facility] PA.  05/08/20 4:56 PM Carlos Levering RVT

## 2020-05-10 ENCOUNTER — Emergency Department (HOSPITAL_COMMUNITY)
Admission: EM | Admit: 2020-05-10 | Discharge: 2020-05-10 | Disposition: A | Discharge status: Home / Self Care | Payer: Medicare Other | Attending: Emergency Medicine | Admitting: Emergency Medicine

## 2020-05-10 ENCOUNTER — Other Ambulatory Visit: Payer: Self-pay

## 2020-05-10 ENCOUNTER — Encounter (HOSPITAL_COMMUNITY): Payer: Self-pay

## 2020-05-10 ENCOUNTER — Inpatient Hospital Stay: Admission: RE | Admit: 2020-05-10 | Payer: Medicare Other | Source: Ambulatory Visit

## 2020-05-10 ENCOUNTER — Emergency Department (HOSPITAL_COMMUNITY): Payer: Medicare Other

## 2020-05-10 DIAGNOSIS — Z96651 Presence of right artificial knee joint: Secondary | ICD-10-CM | POA: Insufficient documentation

## 2020-05-10 DIAGNOSIS — I517 Cardiomegaly: Secondary | ICD-10-CM | POA: Diagnosis not present

## 2020-05-10 DIAGNOSIS — R0989 Other specified symptoms and signs involving the circulatory and respiratory systems: Secondary | ICD-10-CM | POA: Insufficient documentation

## 2020-05-10 DIAGNOSIS — R600 Localized edema: Secondary | ICD-10-CM | POA: Insufficient documentation

## 2020-05-10 DIAGNOSIS — J810 Acute pulmonary edema: Secondary | ICD-10-CM | POA: Diagnosis not present

## 2020-05-10 DIAGNOSIS — J811 Chronic pulmonary edema: Secondary | ICD-10-CM | POA: Diagnosis not present

## 2020-05-10 DIAGNOSIS — M79662 Pain in left lower leg: Secondary | ICD-10-CM | POA: Diagnosis not present

## 2020-05-10 DIAGNOSIS — M79604 Pain in right leg: Secondary | ICD-10-CM

## 2020-05-10 DIAGNOSIS — H5442A4 Blindness left eye category 4, normal vision right eye: Secondary | ICD-10-CM | POA: Insufficient documentation

## 2020-05-10 DIAGNOSIS — M79661 Pain in right lower leg: Secondary | ICD-10-CM | POA: Insufficient documentation

## 2020-05-10 DIAGNOSIS — R0602 Shortness of breath: Secondary | ICD-10-CM | POA: Diagnosis not present

## 2020-05-10 DIAGNOSIS — M79605 Pain in left leg: Secondary | ICD-10-CM | POA: Diagnosis not present

## 2020-05-10 LAB — CBC WITH DIFFERENTIAL/PLATELET
Abs Immature Granulocytes: 0.03 10*3/uL (ref 0.00–0.07)
Basophils Absolute: 0.1 10*3/uL (ref 0.0–0.1)
Basophils Relative: 1 %
Eosinophils Absolute: 0.1 10*3/uL (ref 0.0–0.5)
Eosinophils Relative: 2 %
HCT: 35.3 % — ABNORMAL LOW (ref 36.0–46.0)
Hemoglobin: 11.5 g/dL — ABNORMAL LOW (ref 12.0–15.0)
Immature Granulocytes: 0 %
Lymphocytes Relative: 30 %
Lymphs Abs: 2.1 10*3/uL (ref 0.7–4.0)
MCH: 33.4 pg (ref 26.0–34.0)
MCHC: 32.6 g/dL (ref 30.0–36.0)
MCV: 102.6 fL — ABNORMAL HIGH (ref 80.0–100.0)
Monocytes Absolute: 0.8 10*3/uL (ref 0.1–1.0)
Monocytes Relative: 12 %
Neutro Abs: 3.8 10*3/uL (ref 1.7–7.7)
Neutrophils Relative %: 55 %
Platelets: 251 10*3/uL (ref 150–400)
RBC: 3.44 MIL/uL — ABNORMAL LOW (ref 3.87–5.11)
RDW: 13.3 % (ref 11.5–15.5)
WBC: 6.9 10*3/uL (ref 4.0–10.5)
nRBC: 0 % (ref 0.0–0.2)

## 2020-05-10 LAB — COMPREHENSIVE METABOLIC PANEL
ALT: 18 U/L (ref 0–44)
AST: 19 U/L (ref 15–41)
Albumin: 3.3 g/dL — ABNORMAL LOW (ref 3.5–5.0)
Alkaline Phosphatase: 65 U/L (ref 38–126)
Anion gap: 7 (ref 5–15)
BUN: 23 mg/dL (ref 8–23)
CO2: 19 mmol/L — ABNORMAL LOW (ref 22–32)
Calcium: 9.1 mg/dL (ref 8.9–10.3)
Chloride: 113 mmol/L — ABNORMAL HIGH (ref 98–111)
Creatinine, Ser: 0.78 mg/dL (ref 0.44–1.00)
GFR, Estimated: >60 mL/min (ref >60)
Glucose, Bld: 103 mg/dL — ABNORMAL HIGH (ref 70–99)
Potassium: 3.5 mmol/L (ref 3.5–5.1)
Sodium: 139 mmol/L (ref 135–145)
Total Bilirubin: 0.2 mg/dL — ABNORMAL LOW (ref 0.3–1.2)
Total Protein: 6.9 g/dL (ref 6.5–8.1)

## 2020-05-10 LAB — BRAIN NATRIURETIC PEPTIDE: B Natriuretic Peptide: 30.2 pg/mL (ref 0.0–100.0)

## 2020-05-10 MED ORDER — FUROSEMIDE 20 MG PO TABS: 20.0000 mg | ORAL_TABLET | Freq: Every day | ORAL | 0 refills | Status: DC

## 2020-05-10 MED ORDER — FUROSEMIDE 20 MG PO TABS
20.0000 mg | ORAL_TABLET | Freq: Once | ORAL | Status: AC
  Administered 2020-05-10: 20 mg via ORAL
  Filled 2020-05-10: qty 1

## 2020-05-10 NOTE — ED Triage Notes (Signed)
Pt from PCP for eval of bilateral leg pain and shortness of breath x 1 week. Reports swelling to legs last week, but none today. Pain worse on R leg. Came to be seen on 3/7 but left without being seen due to being vision impaired and not have a ride late at night.

## 2020-05-10 NOTE — ED Notes (Signed)
IV removed from [place]; catheter intact; site clean, dry

## 2020-05-10 NOTE — ED Notes (Signed)
Patient verbalizes understanding of discharge instructions. Opportunity for questioning and answers were provided. Pt discharged from ED. 

## 2020-05-10 NOTE — ED Provider Notes (Signed)
[facility] EMERGENCY DEPARTMENT Provider Note   CSN: [id number] Arrival date & time: 05/10/20  1937     History Chief Complaint  Patient presents with  . Leg Pain    Emily Peck is a 65 y.o. female.  HPI Elderly female with multiple medical issues including blindness, chronic pain now presents with concern of ongoing pain in both lower extremities as well as swelling. No clear precipitant.  Symptoms have been present for about 2 weeks, progression appointment she needs assistance with a walker to ambulate. The pain is symmetric, but the swelling is asymmetric.  Right leg is slightly greater than left in size.  No recent change in medication, diet, activity. Patient saw her physician 2 days ago, was sent here for evaluation.  She had studies performed while waiting, but left prior to completing her evaluation at that point.  Today she returns due to ongoing discomfort in both legs.  No other new complaints including chest pain, fever, vomiting, falling.  Pain is sore, severe, persistent, not improved with typical home meds from her pain clinic.    Past Medical History:  Diagnosis Date  . Anxiety   . Clotting disorder ([place])   . Depression   . Dyspnea   . GERD (gastroesophageal reflux disease)   . Headache   . Lumbosacral spondylosis without myelopathy   . Neuropathy   . Postlaminectomy syndrome, lumbar region   . Postlaminectomy syndrome, thoracic region   . Stroke [facility])     Patient Active Problem List   Diagnosis Date Noted  . 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 Category 4 blindness of left eye 04/19/2019  . Nocturia more than twice per night 04/19/2019  . 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  . Nasal septal deviation 04/19/2019  . Excessive daytime sleepiness 04/19/2019  . Neuropathy 04/19/2019  . Sleep-related headache 04/19/2019  . 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[place]) 04/19/2019  . Rotator cuff syndrome of both  shoulders 06/09/2018  . Cervicalgia 03/19/2018  . Disorder of rotator cuff syndrome of right shoulder and allied disorder 01/09/2018  . Primary osteoarthritis of right knee 01/09/2016  . Peripheral polyneuropathy 10/19/2015  . Knee pain, chronic 06/11/2013  . Thoracic postlaminectomy syndrome 06/11/2013  . Pes planus of both feet 06/11/2013  . Trochanteric bursitis of both hips 11/06/2012  . Lumbosacral spondylosis without myelopathy 05/09/2011  . DEPRESSION 07/24/2006  . CARPAL TUNNEL SYNDROME, RIGHT 07/24/2006  . MYOPIA, 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  . DEGENERATIVE JOINT DISEASE 07/24/2006  . DISORDER, LUMBAR DISC W/MYELOPATHY 07/24/2006  . 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 AVULSION, EYE 12/23/2005    Past Surgical History:  Procedure Laterality Date  . ABDOMINAL HYSTERECTOMY    . BACK SURGERY    . EYE SURGERY     eye removed right   . KNEE ARTHROSCOPY    . 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   Procedure: TOTAL KNEE ARTHROPLASTY;  Surgeon: Melrose Nakayama, MD;  Location: [facility];  Service: Orthopedics;  Laterality: Right;     OB History   No obstetric history on file.     Family History  Problem Relation Age of Onset  . Cancer Mother   . Diabetes Mother     Social History   Tobacco Use  . Smoking status: Never Smoker  . Smokeless tobacco: Never Used  Vaping Use  . Vaping Use: Never used  Substance Use Topics  . Alcohol use: No  . Drug use: No    Home Medications Prior to  Admission medications   Medication Sig Start Date End Date Taking? Authorizing Provider  acetaminophen-codeine (TYLENOL #4) 300-60 MG tablet TAKE 1 TABLET BY MOUTH FOUR TIMES DAILY AS NEEDED FOR MODERATE PAIN 02/16/20   Kirsteins, Luanna Salk, MD  Ascorbic Acid (VITAMIN C PO) Take 1 tablet by mouth daily.    [provider]  B Complex Vitamins (VITAMIN B COMPLEX PO) Take 1 tablet by mouth daily. Patient not taking: Reported on 03/29/2020    [provider]   Camphor-Eucalyptus-Menthol (VICKS VAPORUB EX) Apply topically. Uses on feet prn for pain  (family dollar brand)    [provider]  diazepam (VALIUM) 10 MG tablet Take one tablet po 30 minutes before procedure Patient not taking: Reported on 03/29/2020 09/28/19   Charlett Blake, MD  escitalopram (LEXAPRO) 20 MG tablet Take 20 mg by mouth at bedtime.  12/13/13   [provider]  gabapentin (NEURONTIN) 600 MG tablet Take 2 tablets (1,200 mg total) by mouth 3 (three) times daily. 03/29/20   Lomax, Amy, NP  ibuprofen (ADVIL) 800 MG tablet  01/31/20   [provider]  Lido-Capsaicin-Men-Methyl Sal 0.5-0.035-5-20 % PTCH Place one patch on the bottom of each foot every night at bedtime 03/29/20   Lomax, Amy, NP  Liniments (BEN GAY EX) Apply 1 application topically as needed (joint pain).    [provider]  methocarbamol (ROBAXIN) 500 MG tablet Take 1 tablet (500 mg total) by mouth every 8 (eight) hours as needed for muscle spasms. 03/17/20   Kirsteins, Luanna Salk, MD  Multiple Vitamin (MULTIVITAMIN) tablet Take 1 tablet by mouth daily. OTC    [provider]  potassium chloride (KLOR-CON) 10 MEQ tablet Take 1 tablet by mouth daily. 01/04/19   [provider]  Propylene Glycol 0.6 % SOLN Place 1 drop into both eyes as needed (dry eyes).    [provider]  Topiramate (TOPAMAX PO)     [provider]  traZODone (DESYREL) 150 MG tablet Take 150 mg by mouth at bedtime. 07/13/14   [provider]  VITAMIN D PO Take 1 tablet by mouth daily.    [provider]  warfarin (COUMADIN) 6 MG tablet Take 6-9 mg by mouth daily. Take 9mg  Monday, Wednesday, and Fridays, then all other days take 6mg s daily    [provider]    Allergies    Codeine and No known allergies  Review of Systems   Review of Systems  Constitutional:       Per HPI, otherwise negative  HENT:       Per HPI, otherwise negative  Eyes: Positive  for visual disturbance.  Respiratory:       Per HPI, otherwise negative  Cardiovascular:       Per HPI, otherwise negative  Gastrointestinal: Negative for vomiting.  Endocrine:       Negative aside from HPI  Genitourinary:       Neg aside from HPI   Musculoskeletal:       Per HPI, otherwise negative  Skin: Negative.   Neurological: Negative for syncope.    Physical Exam Updated Vital Signs BP 125/80   Pulse (!) 56   Temp 97.8 F (36.6 C) (Oral)   Resp 11   Ht 5\' 6"  (1.676 m)   Wt 96.4 kg   SpO2 97%   BMI 34.30 kg/m   Physical Exam Vitals and nursing note reviewed.  Constitutional:      General: She is not in acute distress.  Appearance: She is well-developed and well-nourished.  HENT:     Head: Normocephalic and atraumatic.  Eyes:     Extraocular Movements: EOM normal.     Conjunctiva/sclera: Conjunctivae normal.  Cardiovascular:     Rate and Rhythm: Normal rate and regular rhythm.     Pulses: Normal pulses.  Pulmonary:     Effort: Pulmonary effort is normal. No respiratory distress.     Breath sounds: Normal breath sounds. No stridor.  Abdominal:     General: There is no distension.  Musculoskeletal:     Right lower leg: Edema present.     Left lower leg: Edema present.     Comments: Right leg is slightly greater than left in terms of size. No deformity, patient moves each hip, knee, ankle appropriately, has hyperesthesia, with pain reported with even mild palpation of the calves bilaterally.  Skin:    General: Skin is warm and dry.     Comments: Vertically oriented scar right knee from a prior replacement, unremarkable  Neurological:     Mental Status: She is alert and oriented to person, place, and time.     Cranial Nerves: No cranial nerve deficit.     Comments: Aside from blindness, cranial nerves unremarkable, distal reflexes intact, patient moves extremities appropriately and to command.  Psychiatric:        Mood and Affect: Mood and affect normal.       ED Results / Procedures / Treatments   Labs (all labs ordered are listed, but only abnormal results are displayed) Labs Reviewed  COMPREHENSIVE METABOLIC PANEL - Abnormal; Notable for the following components:      Result Value   Chloride 113 (*)    CO2 19 (*)    Glucose, Bld 103 (*)    Albumin 3.3 (*)    Total Bilirubin 0.2 (*)    All other components within normal limits  CBC WITH DIFFERENTIAL/PLATELET - Abnormal; Notable for the following components:   RBC 3.44 (*)    Hemoglobin 11.5 (*)    HCT 35.3 (*)    MCV 102.6 (*)    All other components within normal limits  BRAIN NATRIURETIC PEPTIDE    EKG EKG Interpretation  Date/Time:  Wednesday May 10 2020 08:11:34 EST Ventricular Rate:  63 PR Interval:    QRS Duration: 98 QT Interval:  455 QTC Calculation: 466 R Axis:   -4 Text Interpretation: Sinus rhythm Abnormal R-wave progression, early transition Otherwise within normal limits Confirmed by Carmin Muskrat [phone]) on 05/10/2020 10:20:48 AM   Radiology-including review of the chart with ultrasound from 2 days ago, x-ray from 2 days ago, as well as today's study. DG Chest 2 View  Result Date: 05/08/2020 CLINICAL DATA:  Shortness of breath EXAM: CHEST - 2 VIEW COMPARISON:  12/29/2015 FINDINGS: Lungs are clear.  No pleural effusion or pneumothorax. The heart is normal in size. Lower thoracic/upper lumbar fixation hardware with degenerative changes. IMPRESSION: Normal chest radiographs. Electronically Signed   By: Julian Hy M.D.   On: 05/08/2020 15:47   DG Chest Port 1 View  Result Date: 05/10/2020 CLINICAL DATA:  Edema.  Leg pain. EXAM: PORTABLE CHEST 1 VIEW COMPARISON:  05/08/2020. FINDINGS: Mild cardiomegaly and pulmonary venous congestion. Low lung volumes. Mild bilateral interstitial prominence. Mild interstitial edema cannot be excluded. No prominent pleural effusion. No pneumothorax. Prior thoracolumbar spine fusion. IMPRESSION: Mild cardiomegaly and  pulmonary venous congestion. Mild bilateral interstitial prominence. Mild interstitial edema cannot be excluded. Electronically Signed   By: Marcello Moores  Register   On: 05/10/2020 08:21   VAS [place] LOWER EXTREMITY VENOUS (DVT) (ONLY [facility] & [facility])  Result Date: 05/08/2020  Lower Venous DVT Study Indications: Edema.  Limitations: Poor ultrasound/tissue interface and patient pain tolerance. Comparison Study: No prior studies. Performing Technologist: Oliver Hum RVT  Examination Guidelines: A complete evaluation includes B-mode imaging, spectral Doppler, color Doppler, and power Doppler as needed of all accessible portions of each vessel. Bilateral testing is considered an integral part of a complete examination. Limited examinations for reoccurring indications may be performed as noted. The reflux portion of the exam is performed with the patient in reverse Trendelenburg.  +---------+---------------+---------+-----------+----------+--------------+ RIGHT    CompressibilityPhasicitySpontaneityPropertiesThrombus Aging +---------+---------------+---------+-----------+----------+--------------+ CFV      Full           Yes      Yes                                 +---------+---------------+---------+-----------+----------+--------------+ SFJ      Full                                                        +---------+---------------+---------+-----------+----------+--------------+ FV Prox  Full                                                        +---------+---------------+---------+-----------+----------+--------------+ FV Mid   Full                                                        +---------+---------------+---------+-----------+----------+--------------+ FV DistalFull                                                        +---------+---------------+---------+-----------+----------+--------------+ PFV      Full                                                         +---------+---------------+---------+-----------+----------+--------------+ POP      Full           Yes      Yes                                 +---------+---------------+---------+-----------+----------+--------------+ PTV      Full                                                        +---------+---------------+---------+-----------+----------+--------------+  PERO     Full                                                        +---------+---------------+---------+-----------+----------+--------------+   +---------+---------------+---------+-----------+----------+--------------+ LEFT     CompressibilityPhasicitySpontaneityPropertiesThrombus Aging +---------+---------------+---------+-----------+----------+--------------+ CFV      Full           Yes      Yes                                 +---------+---------------+---------+-----------+----------+--------------+ SFJ      Full                                                        +---------+---------------+---------+-----------+----------+--------------+ FV Prox  Full                                                        +---------+---------------+---------+-----------+----------+--------------+ FV Mid                  Yes      Yes                                 +---------+---------------+---------+-----------+----------+--------------+ FV DistalFull                                                        +---------+---------------+---------+-----------+----------+--------------+ PFV      Full                                                        +---------+---------------+---------+-----------+----------+--------------+ POP      Full           Yes      Yes                                 +---------+---------------+---------+-----------+----------+--------------+ PTV      Full                                                         +---------+---------------+---------+-----------+----------+--------------+ PERO     Full                                                        +---------+---------------+---------+-----------+----------+--------------+  Summary: RIGHT: - There is no evidence of deep vein thrombosis in the lower extremity. However, portions of this examination were limited- see technologist comments above.  - No cystic structure found in the popliteal fossa.  LEFT: - There is no evidence of deep vein thrombosis in the lower extremity. However, portions of this examination were limited- see technologist comments above.  - No cystic structure found in the popliteal fossa.  *See table(s) above for measurements and observations. Electronically signed by Ruta Hinds MD on 05/08/2020 at 9:11:29 PM.    Final     Echocardiogram 04/19/2019:  Normal LV systolic function with EF 65%. Left ventricle cavity is normal  in size. Normal left ventricular wall thickness. Normal global wall  motion. Indeterminate diastolic filling pattern. No obvious regional wall  motion abnormalities.  Structurally normal aortic valve. No evidence of aortic stenosis. Moderate  (Grade II) aortic regurgitation.  Mild (Grade I) mitral regurgitation.  Mild to moderate tricuspid regurgitation.  Mild pulmonic regurgitation.  Prior study dated 04/02/2018 noted LVEF of 55 %, no regional wall motion  abnormalities, moderate AR, mild MR, moderate TR, RVSP 83mmHg.     Medications Ordered in ED Medications  furosemide (LASIX) tablet 20 mg (has no administration in time range)    ED Course  I have reviewed the triage vital signs and the nursing notes.  Pertinent labs & imaging results that were available during my care of the patient were reviewed by me and considered in my medical decision making (see chart for details).    10:57 AM On repeat exam the patient is awake, alert, in no distress. We discussed today's findings and those from  yesterday with ultrasound. Here no evidence for DVT, no evidence for cellulitis, no new oxygen requirement. Is some suspicion for fluid overload status, possibly secondary to the patient's known valvular disease. Reviewed the patient's echocardiogram from last year, no substantial decrease in ejection fraction. Patient amenable to, appropriate for initiation of Lasix here, close outpatient follow-up with consideration of echocardiogram.  I discussed the patient's case with our hospitalist home program who coordinates patients with CHF like presentations. Patient has been seen and evaluated by our practitioner, and home care will be coordinated by the team. MDM Rules/Calculators/A&P MDM Number of Diagnoses or Management Options Bilateral lower extremity pain: new, needed workup Pulmonary vascular congestion: new, needed workup   Amount and/or Complexity of Data Reviewed Clinical lab tests: reviewed Tests in the radiology section of CPT: reviewed Decide to obtain previous medical records or to obtain history from someone other than the patient: yes Review and summarize past medical records: yes Discuss the patient with other providers: yes Independent visualization of images, tracings, or specimens: yes  Risk of Complications, Morbidity, and/or Mortality Presenting problems: high Diagnostic procedures: high Management options: high  Critical Care Total time providing critical care: 30-74 minutes  Patient Progress Patient progress: stable  Final Clinical Impression(s) / ED Diagnoses Final diagnoses:  Bilateral lower extremity pain  Pulmonary vascular congestion    Rx / DC Orders ED Discharge Orders         Ordered    furosemide (LASIX) 20 MG tablet  Daily        05/10/20 1208           Carmin Muskrat, MD 05/10/20 1210

## 2020-05-10 NOTE — Care Plan (Addendum)
Came by to assess the patient and see if we could provide some home support.  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  Follows with [facility] for dyspnea on exertion, atypical chest pain.    Her main complaint is bilateral lower extremity pain from the knee down.  This is painful with or without lower extremity edema.  She also complains of dyspnea on exertion.  Both of these issues seem to be chronic based on chart review although the patient says they are new.  She had repeat DVT studies two days ago which were negative for DVT.  Reviewed images personally, all vessels compressible, no augmentation performed so a non diagnostic study for venous insufficiency.    Cardiac: JVP 7, normal rate and rhythm, clear s1 and s2, 2/6 Diastolic Murmur, no rubs or gallops,  Lower extremities: hyperesthesia of lower extremities, normal pulses, possibly mild non pitting edema but she would not allow me to examine closely, no skin breakdown or ulcerations Pulmonary: CTAB, not in distress Abdominal: non distended abdomen, soft and nontender Psych: Alert, conversant POCUS: difficult windows but observed preserved EF, did not assess valves.    Bilateral Lower extremity pain/Edema: -suspect continued worsening neuropathy symptoms, don't think she is taking gabapentin appropriately and not sure if she picked up the compounding cream neurology ordered for her -to be thorough could consider arterial duplex to further evaluate outpatient but given her strong peripheral pulses I feel this is less likely -suspect she has venous insufficiency but can't tolerate compression therapy right now   Dyspnea on exertion: -She does have some moderate AI which was stable with ECHO one year ago -I agree with her cardiology group  that she does not appear to be in decompensated heart failure, bnp very low, not overloaded on exam.   -do not see where she's had PFT's  that I can see for this but this could be mostly decompensation sleep study negative for OSA.   -She does need a repeat ECHO for serial surveillance of AI -she was started on a short course of low dose oral lasix to see if this improves her breathing and lower extremity edema, I think this is okay   Overall I think she needs help with her worsening neuropathy, making sure she is taking medications appropriately and time is taken with education, PT for her deconditioning and assessment for the need for home health support.  She also needs reassurance and to have someone help coordinating these issues and therefore is an excellent candidate for home based primary care and I have referred her to remote health.

## 2020-05-10 NOTE — Discharge Instructions (Addendum)
As discussed, your leg pain is likely due to both swelling and worsening of your chronic condition.  You are starting a new medication.  Please take this daily.  (Discuss appropriate length of treatment with your physician )  Our home health colleagues will follow up with you at home, and coordinate care with your physician.  This may include a echocardiogram in the clinic. Return here for concerning changes in your condition.

## 2020-05-12 DIAGNOSIS — H0589 Other disorders of orbit: Secondary | ICD-10-CM | POA: Diagnosis not present

## 2020-05-12 DIAGNOSIS — Z970 Presence of artificial eye: Secondary | ICD-10-CM | POA: Diagnosis not present

## 2020-05-12 DIAGNOSIS — H5711 Ocular pain, right eye: Secondary | ICD-10-CM | POA: Diagnosis not present

## 2020-05-15 ENCOUNTER — Other Ambulatory Visit: Payer: Self-pay | Admitting: Physical Medicine & Rehabilitation

## 2020-06-01 DIAGNOSIS — Z7901 Long term (current) use of anticoagulants: Secondary | ICD-10-CM | POA: Diagnosis not present

## 2020-06-01 DIAGNOSIS — Z952 Presence of prosthetic heart valve: Secondary | ICD-10-CM | POA: Diagnosis not present

## 2020-06-14 LAB — COLOGUARD: COLOGUARD: NEGATIVE

## 2020-06-15 ENCOUNTER — Encounter: Payer: Self-pay | Admitting: Registered Nurse

## 2020-06-15 ENCOUNTER — Other Ambulatory Visit: Payer: Self-pay

## 2020-06-15 ENCOUNTER — Encounter: Payer: Medicare HMO | Attending: Physical Medicine & Rehabilitation | Admitting: Registered Nurse

## 2020-06-15 VITALS — BP 93/62 | HR 66 | Temp 98.0°F | Ht 66.0 in | Wt 215.0 lb

## 2020-06-15 DIAGNOSIS — M25561 Pain in right knee: Secondary | ICD-10-CM | POA: Insufficient documentation

## 2020-06-15 DIAGNOSIS — Z79891 Long term (current) use of opiate analgesic: Secondary | ICD-10-CM | POA: Diagnosis present

## 2020-06-15 DIAGNOSIS — M75102 Unspecified rotator cuff tear or rupture of left shoulder, not specified as traumatic: Secondary | ICD-10-CM | POA: Insufficient documentation

## 2020-06-15 DIAGNOSIS — M5416 Radiculopathy, lumbar region: Secondary | ICD-10-CM | POA: Diagnosis present

## 2020-06-15 DIAGNOSIS — G5793 Unspecified mononeuropathy of bilateral lower limbs: Secondary | ICD-10-CM | POA: Insufficient documentation

## 2020-06-15 DIAGNOSIS — M75101 Unspecified rotator cuff tear or rupture of right shoulder, not specified as traumatic: Secondary | ICD-10-CM | POA: Insufficient documentation

## 2020-06-15 DIAGNOSIS — G8929 Other chronic pain: Secondary | ICD-10-CM | POA: Insufficient documentation

## 2020-06-15 DIAGNOSIS — M47817 Spondylosis without myelopathy or radiculopathy, lumbosacral region: Secondary | ICD-10-CM | POA: Insufficient documentation

## 2020-06-15 DIAGNOSIS — Z5181 Encounter for therapeutic drug level monitoring: Secondary | ICD-10-CM | POA: Diagnosis present

## 2020-06-15 DIAGNOSIS — M25562 Pain in left knee: Secondary | ICD-10-CM | POA: Diagnosis present

## 2020-06-15 DIAGNOSIS — M542 Cervicalgia: Secondary | ICD-10-CM | POA: Diagnosis present

## 2020-06-15 DIAGNOSIS — G894 Chronic pain syndrome: Secondary | ICD-10-CM | POA: Insufficient documentation

## 2020-06-15 MED ORDER — METHOCARBAMOL 500 MG PO TABS: 500.0000 mg | ORAL_TABLET | Freq: Three times a day (TID) | ORAL | 1 refills | Status: DC | PRN

## 2020-06-15 MED ORDER — ACETAMINOPHEN-CODEINE 300-60 MG PO TABS: 1.0000 | ORAL_TABLET | Freq: Four times a day (QID) | ORAL | 1 refills | Status: DC | PRN

## 2020-06-15 NOTE — Progress Notes (Signed)
Subjective:    Patient ID: Emily Peck, female    DOB: 13-Jul-1955, 65 y.o.   MRN: [id number]  HPI: Emily Peck is a 65 y.o. female who returns for follow up appointment for chronic pain and medication refill. She states her pain is located in her neck, bilateral shoulders and lower back pain radiating into her bilateral lower extremities. Also reports bilateral feet pain with tingling and numbness. She rates her pain 5. Her current exercise regime is physical therapy weekly and walking.  [name] equivalent is 36.00 MME.  Last Oral Swab was Performed on 02/11/2020, it was consistent.    Pain Inventory Average Pain 2 Pain Right Now 5 My pain is sharp, stabbing and aching  In the last 24 hours, has pain interfered with the following? General activity 7 Relation with others 9 Enjoyment of life N/A What TIME of day is your pain at its worst? daytime and evening Sleep (in general) Fair  Pain is worse with: walking, bending, standing and some activites Pain improves with: medication Relief from Meds: NA  Family History  Problem Relation Age of Onset  . Cancer Mother   . Diabetes Mother    Social History   Socioeconomic History  . Marital status: Single    Spouse name: Not on file  . Number of children: 2  . Years of education: Not on file  . Highest education level: Not on file  Occupational History  . Not on file  Tobacco Use  . Smoking status: Never Smoker  . Smokeless tobacco: Never Used  Vaping Use  . Vaping Use: Never used  Substance and Sexual Activity  . Alcohol use: No  . Drug use: No  . Sexual activity: Not on file  Other Topics Concern  . Not on file  Social History Narrative  . Not on file   Social Determinants of Health   Financial Resource Strain: Not on file  Food Insecurity: Not on file  Transportation Needs: Not on file  Physical Activity: Not on file  Stress: Not on file  Social Connections: Not on file   Past Surgical  History:  Procedure Laterality Date  . ABDOMINAL HYSTERECTOMY    . BACK SURGERY    . EYE SURGERY     eye removed right   . KNEE ARTHROSCOPY    . 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 ABDOMINAL HYSTERECTOMY    . BACK SURGERY    . EYE SURGERY     eye removed right   . KNEE ARTHROSCOPY    . 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   Procedure: TOTAL KNEE ARTHROPLASTY;  Surgeon: Melrose Nakayama, MD;  Location: [place];  Service: Orthopedics;  Laterality: Right;   Past Medical History:  Diagnosis Date  . Anxiety   . Clotting disorder ([place])   . Depression   . Dyspnea   . GERD (gastroesophageal reflux disease)   . Headache   . Lumbosacral spondylosis without myelopathy   . Neuropathy   . Postlaminectomy syndrome, lumbar region   . Postlaminectomy syndrome, thoracic region   . Stroke ([facility])    Pulse 98   Ht 5\' 6"  (1.676 m)   Wt 215 lb (97.5 kg)   BMI 34.70 kg/m   Opioid Risk Score:   Fall Risk Score:  `1  Depression screen PHQ 2/9  Depression screen [facility] 2/9 03/17/2020 07/22/2019 03/09/2019 06/09/2018 07/07/2017 04/11/2017 01/03/2017  Decreased Interest 0 0 0 1 0 0 0  Down, Depressed, Hopeless 0 0 0 1 - 0 0  PHQ - 2 Score 0 0 0 2 0 0 0  Altered sleeping - - - - - - -  Tired, decreased energy - - - - - - -  Change in appetite - - - - - - -  Feeling bad or failure about yourself  - - - - - - -  Trouble concentrating - - - - - - -  Moving slowly or fidgety/restless - - - - - - -  Suicidal thoughts - - - - - - -  PHQ-9 Score - - - - - - -  Difficult doing work/chores - - - - - - -  Some recent data might be hidden      Review of Systems  Constitutional: Negative.   HENT: Negative.   Eyes: Negative.   Respiratory: Negative.   Cardiovascular: Negative.   Gastrointestinal: Negative.   Endocrine:  Negative.   Genitourinary: Negative.   Musculoskeletal: Positive for back pain, gait problem, myalgias and neck pain.       RIGHT AND LEFT LEG PAIN HAS GOT WORSER  Skin: Negative.   Allergic/Immunologic: Negative.   Hematological: Negative.   Psychiatric/Behavioral: Negative.        Objective:   Physical Exam Vitals and nursing note reviewed.  Constitutional:      Appearance: Normal appearance.  Neck:     Comments: Cervical Paraspinal Tenderness: C-5-C-6 Cardiovascular:     Rate and Rhythm: Normal rate and regular rhythm.     Pulses: Normal pulses.     Heart sounds: Normal heart sounds.  Pulmonary:     Effort: Pulmonary effort is normal.     Breath sounds: Normal breath sounds.  Musculoskeletal:     Cervical back: Normal range of motion and neck supple.     Comments: Normal Muscle Bulk and Muscle Testing Reveals:  Upper Extremities: Right : Decrease ROM 45 Degrees and Muscle Strength 5/5 Left Upper Extremity: Full ROM and Muscle Strength 5/5 Bilateral AC Joint Tenderness: L>R  Lumbar Paraspinal Tenderness: L-3-L-5 Lower Extremities: Full ROM and Muscle Strength 5/5 Arises from Table slowly using cane for support Narrow Based Gait   Skin:    General: Skin is warm and dry.  Neurological:     Mental Status: She is alert and oriented to person, place, and time.  Psychiatric:        Mood and Affect: Mood normal.        Behavior: Behavior normal.           Assessment & Plan:  1.Cervicalgia: Continue HEP as Tolerated. Continue to Monitor. 06/15/2020 2. Rotator Cuff Syndrome of Both Shoulders: Continue HEP as Tolerated. Continue to Monitor. 06/15/2020 3. 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 06/15/2020 Refilled:Tylenol #4 one tablet 4 times a day as needed #12004/14/2022 4. Lumbosacral Spondylosis/ Lumbar Radiculitis:Continue current medication regimen with Gabapentin.S/ PRight MBB on 10/01/2019. Continue to monitor.  4. Muscle Spasm:  Continue current medication regimen with Methocarbamol.  06/15/2020.  F/U in 3 months

## 2020-06-30 ENCOUNTER — Ambulatory Visit
Admission: RE | Admit: 2020-06-30 | Discharge: 2020-06-30 | Disposition: A | Discharge status: Home / Self Care | Payer: Medicare Other | Source: Ambulatory Visit | Attending: Certified Registered" | Admitting: Certified Registered"

## 2020-06-30 ENCOUNTER — Other Ambulatory Visit: Payer: Self-pay

## 2020-06-30 DIAGNOSIS — Z1231 Encounter for screening mammogram for malignant neoplasm of breast: Secondary | ICD-10-CM

## 2020-07-18 ENCOUNTER — Ambulatory Visit
Admission: RE | Admit: 2020-07-18 | Discharge: 2020-07-18 | Disposition: A | Discharge status: Home / Self Care | Payer: Medicare HMO | Source: Ambulatory Visit | Attending: Nurse Practitioner | Admitting: Nurse Practitioner

## 2020-07-18 ENCOUNTER — Other Ambulatory Visit: Payer: Self-pay

## 2020-07-18 DIAGNOSIS — Z13820 Encounter for screening for osteoporosis: Secondary | ICD-10-CM

## 2020-08-14 ENCOUNTER — Telehealth: Payer: Self-pay | Admitting: Registered Nurse

## 2020-08-14 NOTE — Telephone Encounter (Signed)
Refill request for Tylenol #4 last filled #120 on 07/15/20.

## 2020-08-14 NOTE — Telephone Encounter (Signed)
PMP was Reviewed. T&C # 4 refilled. Placed a call to Emily Peck , no answer left message to return the call.

## 2020-08-22 ENCOUNTER — Encounter: Payer: Self-pay | Admitting: Family Medicine

## 2020-09-11 ENCOUNTER — Encounter: Payer: Medicare HMO | Admitting: Registered Nurse

## 2020-09-22 ENCOUNTER — Encounter: Payer: Medicare HMO | Attending: Registered Nurse | Admitting: Registered Nurse

## 2020-09-22 ENCOUNTER — Encounter: Payer: Self-pay | Admitting: Registered Nurse

## 2020-09-22 ENCOUNTER — Other Ambulatory Visit: Payer: Self-pay

## 2020-09-22 VITALS — BP 101/67 | HR 76 | Temp 98.0°F | Ht 66.0 in | Wt 215.6 lb

## 2020-09-22 DIAGNOSIS — M7062 Trochanteric bursitis, left hip: Secondary | ICD-10-CM | POA: Diagnosis present

## 2020-09-22 DIAGNOSIS — M75101 Unspecified rotator cuff tear or rupture of right shoulder, not specified as traumatic: Secondary | ICD-10-CM | POA: Insufficient documentation

## 2020-09-22 DIAGNOSIS — G894 Chronic pain syndrome: Secondary | ICD-10-CM | POA: Diagnosis present

## 2020-09-22 DIAGNOSIS — M47817 Spondylosis without myelopathy or radiculopathy, lumbosacral region: Secondary | ICD-10-CM | POA: Insufficient documentation

## 2020-09-22 DIAGNOSIS — M25562 Pain in left knee: Secondary | ICD-10-CM | POA: Diagnosis present

## 2020-09-22 DIAGNOSIS — M542 Cervicalgia: Secondary | ICD-10-CM | POA: Diagnosis present

## 2020-09-22 DIAGNOSIS — M25561 Pain in right knee: Secondary | ICD-10-CM | POA: Diagnosis present

## 2020-09-22 DIAGNOSIS — M5412 Radiculopathy, cervical region: Secondary | ICD-10-CM | POA: Diagnosis present

## 2020-09-22 DIAGNOSIS — Z79891 Long term (current) use of opiate analgesic: Secondary | ICD-10-CM | POA: Insufficient documentation

## 2020-09-22 DIAGNOSIS — M75102 Unspecified rotator cuff tear or rupture of left shoulder, not specified as traumatic: Secondary | ICD-10-CM | POA: Diagnosis present

## 2020-09-22 DIAGNOSIS — Z5181 Encounter for therapeutic drug level monitoring: Secondary | ICD-10-CM | POA: Diagnosis present

## 2020-09-22 DIAGNOSIS — M5416 Radiculopathy, lumbar region: Secondary | ICD-10-CM | POA: Diagnosis present

## 2020-09-22 DIAGNOSIS — G8929 Other chronic pain: Secondary | ICD-10-CM | POA: Insufficient documentation

## 2020-09-22 MED ORDER — ACETAMINOPHEN-CODEINE #4 300-60 MG PO TABS: 1.0000 | ORAL_TABLET | Freq: Four times a day (QID) | ORAL | 2 refills | Status: DC | PRN

## 2020-09-22 NOTE — Progress Notes (Signed)
Subjective:    Patient ID: Emily Peck, female    DOB: 06-Aug-1955, 65 y.o.   MRN: [id number]  HPI: Emily Peck is a 65 y.o. female who returns for follow up appointment for chronic pain and medication refill. She states her  pain is located in her neck radiating into her bilateral shoulders, lower back pain radiating into his left hip and bilateral knee pain. She rates her pain 8. Her current exercise regime is walking and performing stretching exercises.    [name] Morphine equivalent is 36.00 MME.   Oral Swab was Performed today.   Pain Inventory Average Pain 5 Pain Right Now 8 My pain is constant, sharp, stabbing, and tingling  In the last 24 hours, has pain interfered with the following? General activity 2 Relation with others 3 Enjoyment of life 2 What TIME of day is your pain at its worst? daytime and evening Sleep (in general) Poor  Pain is worse with: walking and standing Pain improves with: rest and TENS Relief from Meds:  not answered--- has had covid and hurt in her joints very badly and reports she had to take a few extra pain pills  Family History  Problem Relation Age of Onset   Cancer Mother    Diabetes Mother    Social History   Socioeconomic History   Marital status: Single    Spouse name: Not on file   Number of children: 2   Years of education: Not on file   Highest education level: Not on file  Occupational History   Not on file  Tobacco Use   Smoking status: Never   Smokeless tobacco: Never  Vaping Use   Vaping Use: Never used  Substance and Sexual Activity   Alcohol use: No   Drug use: No   Sexual activity: Not on file  Other Topics Concern   Not on file  Social History Narrative   Not on file   Social Determinants of Health   Financial Resource Strain: Not on file  Food Insecurity: Not on file  Transportation Needs: Not on file  Physical Activity: Not on file  Stress: Not on file  Social Connections: Not on file    Past Surgical History:  Procedure Laterality Date   [place]     eye removed right    KNEE ARTHROSCOPY     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   Procedure: TOTAL KNEE ARTHROPLASTY;  Surgeon: Melrose Nakayama, MD;  Location: [place];  Service: Orthopedics;  Laterality: Right;   Past Surgical History:  Procedure Laterality Date   ABDOMINAL HYSTERECTOMY     BACK SURGERY     EYE SURGERY     eye removed right    KNEE ARTHROSCOPY     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   Procedure: TOTAL KNEE ARTHROPLASTY;  Surgeon: Melrose Nakayama, MD;  Location: [place];  Service: Orthopedics;  Laterality: Right;   Past Medical History:  Diagnosis Date   Anxiety    Clotting disorder (HCC)    Depression    Dyspnea    GERD (gastroesophageal reflux disease)    Headache    Lumbosacral spondylosis without myelopathy    Neuropathy    Postlaminectomy syndrome, lumbar region    Postlaminectomy syndrome, thoracic region    Stroke (HCC)    BP 101/67   Pulse 76   Temp 98 F (36.7 C)   Ht '5\' 6"'$  (  1.676 m)   Wt 215 lb 9.6 oz (97.8 kg)   SpO2 94%   BMI 34.80 kg/m   Opioid Risk Score:   Fall Risk Score:  `1  Depression screen PHQ 2/9  Depression screen [facility] 2/9 09/22/2020 03/17/2020 07/22/2019 03/09/2019 06/09/2018 07/07/2017 04/11/2017  Decreased Interest 0 0 0 0 1 0 0  Down, Depressed, Hopeless 0 0 0 0 1 - 0  PHQ - 2 Score 0 0 0 0 2 0 0  Altered sleeping - - - - - - -  Tired, decreased energy - - - - - - -  Change in appetite - - - - - - -  Feeling bad or failure about yourself  - - - - - - -  Trouble concentrating - - - - - - -  Moving slowly or fidgety/restless - - - - - - -  Suicidal thoughts - - - - - - -  PHQ-9 Score - - - - - - -  Difficult doing work/chores - - - - - - -  Some recent data might be hidden     Review of Systems  Constitutional: Negative.   HENT: Negative.    Eyes: Negative.   Respiratory: Negative.    Cardiovascular:  Negative.   Gastrointestinal: Negative.   Endocrine: Negative.   Genitourinary: Negative.   Musculoskeletal:  Positive for back pain.       Shoulder pain   Skin: Negative.   Allergic/Immunologic: Negative.   Neurological: Negative.        Tingling  Hematological:  Bruises/bleeds easily.       Warfarin  Psychiatric/Behavioral: Negative.    All other systems reviewed and are negative.     Objective:   Physical Exam Vitals and nursing note reviewed.  Constitutional:      Appearance: Normal appearance.  Cardiovascular:     Rate and Rhythm: Normal rate and regular rhythm.     Pulses: Normal pulses.     Heart sounds: Normal heart sounds.  Pulmonary:     Effort: Pulmonary effort is normal.     Breath sounds: Normal breath sounds.  Musculoskeletal:     Cervical back: Normal range of motion and neck supple.     Comments: Normal Muscle Bulk and Muscle Testing Reveals:  Upper Extremities: Decreased ROM 45 Degrees and Muscle Strength  5/5 Bilateral AC Joint Tenderness  Lumbar Paraspinal Tenderness: L-3-L-5 Left Greater Trochanter Tenderness Lower Extremities: Decreased ROM and Muscle Strength 5/5 Bilateral Lower Extremity Flexion Produces Pain into her bilateral Patell[place] Arises from Table Slowly using cane for support Narrow Based Gait     Skin:    General: Skin is warm and dry.  Neurological:     Mental Status: She is alert and oriented to person, place, and time.  Psychiatric:        Mood and Affect: Mood normal.        Behavior: Behavior normal.         Assessment & Plan:  1.Cervicalgia: Continue HEP as Tolerated. Continue to Monitor. 09/22/2020 2. Rotator Cuff Syndrome of Both Shoulders: Continue HEP as Tolerated. Continue to Monitor. 09/22/2020 3. 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 09/22/2020 Refilled:Tylenol #4 one tablet 4 times a day as needed #120 09/22/2020 4. Lumbosacral Spondylosis/ Lumbar Radiculitis: Continue current  medication regimen with Gabapentin. S/ P Right MBB on 10/01/2019. Continue to monitor. 09/22/2020 4. Muscle Spasm: Continue current medication regimen with Methocarbamol.  09/22/2020.   F/U in 3 months

## 2020-09-27 ENCOUNTER — Telehealth: Payer: Self-pay | Admitting: Physical Medicine & Rehabilitation

## 2020-09-27 NOTE — Telephone Encounter (Signed)
Emily Peck is in need of a work note that describes limitations, such as can not stand for long times

## 2020-09-28 ENCOUNTER — Telehealth: Payer: Self-pay | Admitting: Registered Nurse

## 2020-09-28 NOTE — Telephone Encounter (Signed)
ET patient called [phone] and states she is having muscle spasms can y ou call something in - she also said she needs a work note.  She went back to work but they are asking her to stand - and she would like an accomodation that she needs to sit. Please call lk

## 2020-09-29 LAB — DRUG TOX MONITOR 1 W/CONF, ORAL FLD
Amphetamines: NEGATIVE ng/mL (ref <10)
Barbiturates: NEGATIVE ng/mL (ref <10)
Benzodiazepines: NEGATIVE ng/mL (ref <0.50)
Buprenorphine: NEGATIVE ng/mL (ref <0.10)
Cocaine: NEGATIVE ng/mL (ref <5.0)
Codeine: >250 ng/mL — ABNORMAL HIGH (ref <2.5)
Dihydrocodeine: NEGATIVE ng/mL (ref <2.5)
Fentanyl: NEGATIVE ng/mL (ref <0.10)
Heroin Metabolite: NEGATIVE ng/mL (ref <1.0)
Hydrocodone: NEGATIVE ng/mL (ref <2.5)
Hydromorphone: NEGATIVE ng/mL (ref <2.5)
MARIJUANA: NEGATIVE ng/mL (ref <2.5)
MDMA: NEGATIVE ng/mL (ref <10)
Meprobamate: NEGATIVE ng/mL (ref <2.5)
Methadone: NEGATIVE ng/mL (ref <5.0)
Morphine: NEGATIVE ng/mL (ref <2.5)
Nicotine Metabolite: NEGATIVE ng/mL (ref <5.0)
Norhydrocodone: NEGATIVE ng/mL (ref <2.5)
Noroxycodone: NEGATIVE ng/mL (ref <2.5)
Opiates: POSITIVE ng/mL — AB (ref <2.5)
Oxycodone: NEGATIVE ng/mL (ref <2.5)
Oxymorphone: NEGATIVE ng/mL (ref <2.5)
Phencyclidine: NEGATIVE ng/mL (ref <10)
Tapentadol: NEGATIVE ng/mL (ref <5.0)
Tramadol: NEGATIVE ng/mL (ref <5.0)
Zolpidem: NEGATIVE ng/mL (ref <5.0)

## 2020-09-29 LAB — DRUG TOX ALC METAB W/CON, ORAL FLD: Alcohol Metabolite: NEGATIVE ng/mL (ref <25)

## 2020-09-30 ENCOUNTER — Encounter (HOSPITAL_COMMUNITY): Payer: Self-pay | Admitting: Emergency Medicine

## 2020-09-30 ENCOUNTER — Observation Stay (HOSPITAL_COMMUNITY)
Admission: EM | Admit: 2020-09-30 | Discharge: 2020-10-04 | Disposition: A | Discharge status: Home / Self Care | Payer: Medicare HMO | Attending: Internal Medicine | Admitting: Internal Medicine

## 2020-09-30 ENCOUNTER — Other Ambulatory Visit: Payer: Self-pay

## 2020-09-30 DIAGNOSIS — D649 Anemia, unspecified: Secondary | ICD-10-CM | POA: Diagnosis not present

## 2020-09-30 DIAGNOSIS — K219 Gastro-esophageal reflux disease without esophagitis: Secondary | ICD-10-CM | POA: Diagnosis not present

## 2020-09-30 DIAGNOSIS — K5730 Diverticulosis of large intestine without perforation or abscess without bleeding: Secondary | ICD-10-CM | POA: Insufficient documentation

## 2020-09-30 DIAGNOSIS — R42 Dizziness and giddiness: Secondary | ICD-10-CM | POA: Diagnosis present

## 2020-09-30 DIAGNOSIS — Z96651 Presence of right artificial knee joint: Secondary | ICD-10-CM | POA: Insufficient documentation

## 2020-09-30 DIAGNOSIS — E876 Hypokalemia: Secondary | ICD-10-CM | POA: Diagnosis not present

## 2020-09-30 DIAGNOSIS — R634 Abnormal weight loss: Secondary | ICD-10-CM | POA: Diagnosis not present

## 2020-09-30 DIAGNOSIS — Z8673 Personal history of transient ischemic attack (TIA), and cerebral infarction without residual deficits: Secondary | ICD-10-CM | POA: Insufficient documentation

## 2020-09-30 DIAGNOSIS — D509 Iron deficiency anemia, unspecified: Secondary | ICD-10-CM | POA: Diagnosis not present

## 2020-09-30 DIAGNOSIS — Z20822 Contact with and (suspected) exposure to covid-19: Secondary | ICD-10-CM | POA: Diagnosis not present

## 2020-09-30 DIAGNOSIS — K2950 Unspecified chronic gastritis without bleeding: Secondary | ICD-10-CM | POA: Diagnosis not present

## 2020-09-30 DIAGNOSIS — Z7901 Long term (current) use of anticoagulants: Secondary | ICD-10-CM | POA: Insufficient documentation

## 2020-09-30 DIAGNOSIS — R1310 Dysphagia, unspecified: Secondary | ICD-10-CM

## 2020-09-30 LAB — COMPREHENSIVE METABOLIC PANEL
ALT: 14 U/L (ref 0–44)
AST: 18 U/L (ref 15–41)
Albumin: 3.2 g/dL — ABNORMAL LOW (ref 3.5–5.0)
Alkaline Phosphatase: 71 U/L (ref 38–126)
Anion gap: 5 (ref 5–15)
BUN: 12 mg/dL (ref 8–23)
CO2: 22 mmol/L (ref 22–32)
Calcium: 8.7 mg/dL — ABNORMAL LOW (ref 8.9–10.3)
Chloride: 111 mmol/L (ref 98–111)
Creatinine, Ser: 0.67 mg/dL (ref 0.44–1.00)
GFR, Estimated: >60 mL/min (ref >60)
Glucose, Bld: 107 mg/dL — ABNORMAL HIGH (ref 70–99)
Potassium: 3.3 mmol/L — ABNORMAL LOW (ref 3.5–5.1)
Sodium: 138 mmol/L (ref 135–145)
Total Bilirubin: 0.4 mg/dL (ref 0.3–1.2)
Total Protein: 6.6 g/dL (ref 6.5–8.1)

## 2020-09-30 LAB — CBC
HCT: 25.6 % — ABNORMAL LOW (ref 36.0–46.0)
Hemoglobin: 7.7 g/dL — ABNORMAL LOW (ref 12.0–15.0)
MCH: 28.9 pg (ref 26.0–34.0)
MCHC: 30.1 g/dL (ref 30.0–36.0)
MCV: 96.2 fL (ref 80.0–100.0)
Platelets: 297 10*3/uL (ref 150–400)
RBC: 2.66 MIL/uL — ABNORMAL LOW (ref 3.87–5.11)
RDW: 16.9 % — ABNORMAL HIGH (ref 11.5–15.5)
WBC: 7.5 10*3/uL (ref 4.0–10.5)
nRBC: 0.3 % — ABNORMAL HIGH (ref 0.0–0.2)

## 2020-09-30 LAB — CBC WITH DIFFERENTIAL/PLATELET
Abs Immature Granulocytes: 0.02 10*3/uL (ref 0.00–0.07)
Basophils Absolute: 0 10*3/uL (ref 0.0–0.1)
Basophils Relative: 1 %
Eosinophils Absolute: 0.3 10*3/uL (ref 0.0–0.5)
Eosinophils Relative: 4 %
HCT: 20.6 % — ABNORMAL LOW (ref 36.0–46.0)
Hemoglobin: 6 g/dL — CL (ref 12.0–15.0)
Immature Granulocytes: 0 %
Lymphocytes Relative: 24 %
Lymphs Abs: 1.4 10*3/uL (ref 0.7–4.0)
MCH: 29 pg (ref 26.0–34.0)
MCHC: 29.1 g/dL — ABNORMAL LOW (ref 30.0–36.0)
MCV: 99.5 fL (ref 80.0–100.0)
Monocytes Absolute: 0.7 10*3/uL (ref 0.1–1.0)
Monocytes Relative: 12 %
Neutro Abs: 3.5 10*3/uL (ref 1.7–7.7)
Neutrophils Relative %: 59 %
Platelets: 302 10*3/uL (ref 150–400)
RBC: 2.07 MIL/uL — ABNORMAL LOW (ref 3.87–5.11)
RDW: 17 % — ABNORMAL HIGH (ref 11.5–15.5)
WBC: 5.9 10*3/uL (ref 4.0–10.5)
nRBC: 0.3 % — ABNORMAL HIGH (ref 0.0–0.2)

## 2020-09-30 LAB — FERRITIN: Ferritin: 7 ng/mL — ABNORMAL LOW (ref 11–307)

## 2020-09-30 LAB — RESP PANEL BY RT-PCR (FLU A&B, COVID) ARPGX2
Influenza A by PCR: NEGATIVE
Influenza B by PCR: NEGATIVE
SARS Coronavirus 2 by RT PCR: NEGATIVE

## 2020-09-30 LAB — PREPARE RBC (CROSSMATCH)

## 2020-09-30 LAB — RETICULOCYTES
Immature Retic Fract: 12.5 % (ref 2.3–15.9)
RBC.: 2.14 MIL/uL — ABNORMAL LOW (ref 3.87–5.11)
Retic Count, Absolute: 49 10*3/uL (ref 19.0–186.0)
Retic Ct Pct: 2.4 % (ref 0.4–3.1)

## 2020-09-30 LAB — VITAMIN B12: Vitamin B-12: 4770 pg/mL — ABNORMAL HIGH (ref 180–914)

## 2020-09-30 LAB — IRON AND TIBC
Iron: 7 ug/dL — ABNORMAL LOW (ref 28–170)
Saturation Ratios: 2 % — ABNORMAL LOW (ref 10.4–31.8)
TIBC: 433 ug/dL (ref 250–450)
UIBC: 426 ug/dL

## 2020-09-30 LAB — POC OCCULT BLOOD, ED: Fecal Occult Bld: NEGATIVE

## 2020-09-30 LAB — MAGNESIUM: Magnesium: 1.8 mg/dL (ref 1.7–2.4)

## 2020-09-30 LAB — PROTIME-INR
INR: 2.5 — ABNORMAL HIGH (ref 0.8–1.2)
Prothrombin Time: 26.6 seconds — ABNORMAL HIGH (ref 11.4–15.2)

## 2020-09-30 LAB — PHOSPHORUS: Phosphorus: 2.3 mg/dL — ABNORMAL LOW (ref 2.5–4.6)

## 2020-09-30 LAB — APTT: aPTT: 35 seconds (ref 24–36)

## 2020-09-30 LAB — FOLATE: Folate: 14.3 ng/mL (ref >5.9)

## 2020-09-30 LAB — HIV ANTIBODY (ROUTINE TESTING W REFLEX): HIV Screen 4th Generation wRfx: NONREACTIVE

## 2020-09-30 MED ORDER — ACETAMINOPHEN 650 MG RE SUPP: 650.0000 mg | Freq: Four times a day (QID) | RECTAL | Status: DC | PRN

## 2020-09-30 MED ORDER — SODIUM CHLORIDE 0.9 % IV SOLN
10.0000 mL/h | Freq: Once | INTRAVENOUS | Status: AC
  Administered 2020-09-30: 10 mL/h via INTRAVENOUS

## 2020-09-30 MED ORDER — MELATONIN 3 MG PO TABS: 3.0000 mg | ORAL_TABLET | Freq: Every day | ORAL | Status: DC

## 2020-09-30 MED ORDER — LIDOCAINE 5 % EX PTCH
1.0000 | MEDICATED_PATCH | CUTANEOUS | Status: DC
  Administered 2020-09-30 – 2020-10-04 (×5): 1 via TRANSDERMAL
  Filled 2020-09-30 (×5): qty 1

## 2020-09-30 MED ORDER — ACETAMINOPHEN 325 MG PO TABS
650.0000 mg | ORAL_TABLET | Freq: Four times a day (QID) | ORAL | Status: DC | PRN
  Administered 2020-09-30 – 2020-10-01 (×3): 650 mg via ORAL
  Filled 2020-09-30 (×4): qty 2

## 2020-09-30 MED ORDER — POTASSIUM CHLORIDE CRYS ER 20 MEQ PO TBCR
40.0000 meq | EXTENDED_RELEASE_TABLET | Freq: Two times a day (BID) | ORAL | Status: AC
  Administered 2020-09-30 – 2020-10-01 (×2): 40 meq via ORAL
  Filled 2020-09-30 (×2): qty 2

## 2020-09-30 MED ORDER — SODIUM CHLORIDE 0.9% FLUSH
3.0000 mL | Freq: Two times a day (BID) | INTRAVENOUS | Status: DC
  Administered 2020-09-30 – 2020-10-03 (×8): 3 mL via INTRAVENOUS

## 2020-09-30 MED ORDER — TRAZODONE HCL 50 MG PO TABS
150.0000 mg | ORAL_TABLET | Freq: Every day | ORAL | Status: DC
  Administered 2020-09-30 – 2020-10-03 (×4): 150 mg via ORAL
  Filled 2020-09-30 (×3): qty 1
  Filled 2020-09-30: qty 3

## 2020-09-30 MED ORDER — PANTOPRAZOLE SODIUM 40 MG PO TBEC
40.0000 mg | DELAYED_RELEASE_TABLET | Freq: Two times a day (BID) | ORAL | Status: DC
  Administered 2020-09-30 – 2020-10-04 (×7): 40 mg via ORAL
  Filled 2020-09-30 (×7): qty 1

## 2020-09-30 NOTE — ED Notes (Signed)
hgb 7.7 post transfusion. MD and resident made aware.

## 2020-09-30 NOTE — Consult Note (Addendum)
**Covering for Dr. Collene Mares  Referring Provider:  Family Medicine Teaching Service       Primary Care Physician:  Pcp, No Primary Gastroenterologist:  Juanita Craver, MD  Reason for Consultation:    anemia               ASSESSMENT / PLAN   # 65 year old female known to Dr. Collene Mares being admitted with new, severe iron deficiency anemia on warfarin and almost daily NSAID use. She is heme NEGATIVE.  Visually impaired so cannot say whether or not she has had any over bleeding at home. She reports a negative Cologuard 2 weeks ago.  Rule out PUD in the setting of frequent NSAID use.  Rule out erosive esophagitis given significant reflux symptoms.   -- Patient will need an EGD when INR< 2.  Anticipate the EGD will be done on Monday morning by Dr. Collene Mares.  --In the interim patient is getting PRBCs.  --Will add BID Protonix --Clear liquids okay -- Adding CBC and INR for a.m. labs  # Solid food dysphagia without associated weight loss.  Dysphagia could be from severe esophagitis . Rule out peptic stricture.  Esophageal neoplasm seems less likely.  --Further evaluation at time of EGD   HISTORY OF PRESENT ILLNESS                                                                                                                         Chief Complaint:  anemia  Emily Peck is a 65 y.o. female with a past medical history significant for GERD, CVA on chronic warfarin, postlaminectomy syndrome of the lumbar and thoracic regions.   Patient in ED with complaints of lightheadedness and shortness of breath for two weeks.  She was seen by PCP yesterday and received a call to go to the ED for low hemoglobin.   In the ED patient is hemodynamically stable.  Hemoglobin is 6 and she is iron deficient with a ferritin of 7.  Patient takes warfarin for history of CVA, INR is 25.  Renal function is normal.  Albumin 3.2, LFTs otherwise normal.  She is visually impaired and does not know if she has had any blood in her  stools.  She says her BMs are irregular, sometimes she may not have a BM for a week and there hasn't been any change from baseline. She reports a normal colonoscopy at age 52.  She says a Cologuard 2 months ago was normal.  Patient takes ibuprofen 1-2 times a day almost daily on a regular basis.  She has no abdominal pain but does complain of abdominal wall spasms on both sides of her abdomen as waist level. The spasms are random and not related to eating or bowel movements.  She hasn't had any nausea / vomiting. She has a longstanding history of GERD and has taken what she thinks is daily protonix for years. She lost her bottle of protonix two weeks ago and has since been without  reflux treatment.  She doesn't feel like protonix helps as she still gets heartburn and also has solid food dysphagia . She cannot say for sure how long dysphagia has been present but knows that it has been going on for several months  She hasn't had any weight loss and in fact reports weight gain.   Patient endorses a history of gastric cancer in an aunt and colon cancer in an uncle.  SIGNIFICANT DIAGNOTIC STUDIES   No imaging this admission   PREVIOUS ENDOSCOPIC EVALUATIONS   She reports a normal colonoscopy at age 62.  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 Authorizing Provider  acetaminophen-codeine (TYLENOL #4) 300-60 MG tablet Take 1 tablet by mouth 4 (four) times daily as needed. for pain Patient taking differently: Take 1 tablet by mouth 4 (four) times daily as needed for moderate pain. 09/22/20  Yes Bayard Hugger, NP  Ascorbic Acid (VITAMIN C PO) Take 1 tablet by mouth daily.   Yes [provider]  B Complex Vitamins (VITAMIN B COMPLEX PO) Take 1 tablet by mouth daily.   Yes [provider]  Camphor-Eucalyptus-Menthol (VICKS VAPORUB EX) Apply topically. Uses on feet prn for pain  (family dollar brand)   Yes [provider]  diphenhydrAMINE (BENADRYL) 25 MG tablet Take 25-50 mg by mouth 4 (four) times daily as needed for itching. Takes with Tylenol #4 due to codeine allergy.   Yes [provider]  escitalopram (LEXAPRO) 20 MG tablet Take 20 mg by mouth at bedtime.  12/13/13  Yes [provider]  furosemide (LASIX) 20 MG tablet Take 1 tablet (20 mg total) by mouth daily for 7 days. Patient taking differently: Take 20 mg by mouth daily as needed for fluid. 05/10/20 11/11/20 Yes Carmin Muskrat, MD  gabapentin (NEURONTIN) 600 MG tablet Take 2 tablets (1,200 mg total) by mouth 3 (three) times daily. 03/29/20  Yes Lomax, Amy, NP  ibuprofen (ADVIL) 800 MG tablet Take 800 mg by mouth 2 (two) times daily as needed for mild pain. 01/31/20  Yes [provider]  Lido-Capsaicin-Men-Methyl Sal 0.5-0.035-5-20 % PTCH Place one patch on the bottom of each foot every night at bedtime 03/29/20  Yes Lomax, Amy, NP  methocarbamol (ROBAXIN) 500 MG tablet Take 1 tablet (500 mg total) by mouth every 8 (eight) hours as needed for muscle spasms. 06/15/20  Yes Bayard Hugger, NP  potassium chloride (KLOR-CON) 10 MEQ tablet Take 10 mEq by mouth daily. 01/04/19  Yes [provider]  Propylene Glycol 0.6 % SOLN Place 1 drop into both  eyes as needed (dry eyes).  Yes [provider]  sodium chloride (OCEAN) 0.65 % SOLN nasal spray Place 1 spray into both nostrils as needed for congestion.   Yes [provider]  topiramate (TOPAMAX) 200 MG tablet Take 200 mg by mouth at bedtime. 03/30/20  Yes [provider]  traZODone (DESYREL) 150 MG tablet Take 150 mg by mouth at bedtime. 07/13/14  Yes [provider]  VITAMIN D PO Take 1 tablet by mouth daily.   Yes [provider]  warfarin (COUMADIN) 6 MG tablet Take 6-9 mg by mouth daily. Take '9mg'$  Monday, Wednesday, and Fridays, then all other days take '6mg'$ s daily   Yes [provider]    Current Facility-Administered Medications  Medication Dose Route Frequency Provider Last Rate Last Admin   acetaminophen (TYLENOL) tablet 650 mg  650 mg Oral Q6H PRN Marianna Payment, MD       Or   acetaminophen (TYLENOL) suppository 650 mg  650 mg Rectal Q6H PRN Marianna Payment, MD       potassium chloride SA (KLOR-CON) CR tablet 40 mEq  40 mEq Oral BID Delene Ruffini, MD       sodium chloride flush (NS) 0.9 % injection 3 mL  3 mL Intravenous Q12H Marianna Payment, MD   3 mL at 09/30/20 1445   Current Outpatient Medications  Medication Sig Dispense Refill   acetaminophen-codeine (TYLENOL #4) 300-60 MG tablet Take 1 tablet by mouth 4 (four) times daily as needed. for pain (Patient taking differently: Take 1 tablet by mouth 4 (four) times daily as needed for moderate pain.) 120 tablet 2   Ascorbic Acid (VITAMIN C PO) Take 1 tablet by mouth daily.     B Complex Vitamins (VITAMIN B COMPLEX PO) Take 1 tablet by mouth daily.     Camphor-Eucalyptus-Menthol (VICKS VAPORUB EX) Apply topically. Uses on feet prn for pain  (family dollar brand)     diphenhydrAMINE (BENADRYL) 25 MG tablet Take 25-50 mg by mouth 4 (four) times daily as needed for itching. Takes with Tylenol #4 due to codeine allergy.     escitalopram (LEXAPRO) 20 MG tablet Take 20 mg by mouth at  bedtime.   0   furosemide (LASIX) 20 MG tablet Take 1 tablet (20 mg total) by mouth daily for 7 days. (Patient taking differently: Take 20 mg by mouth daily as needed for fluid.) 30 tablet 0   gabapentin (NEURONTIN) 600 MG tablet Take 2 tablets (1,200 mg total) by mouth 3 (three) times daily. 540 tablet 3   ibuprofen (ADVIL) 800 MG tablet Take 800 mg by mouth 2 (two) times daily as needed for mild pain.     Lido-Capsaicin-Men-Methyl Sal 0.5-0.035-5-20 % PTCH Place one patch on the bottom of each foot every night at bedtime 60 patch 11   methocarbamol (ROBAXIN) 500 MG tablet Take 1 tablet (500 mg total) by mouth every 8 (eight) hours as needed for muscle spasms. 90 tablet 1   potassium chloride (KLOR-CON) 10 MEQ tablet Take 10 mEq by mouth daily.     Propylene Glycol 0.6 % SOLN Place 1 drop into both eyes as needed (dry eyes).     sodium chloride (OCEAN) 0.65 % SOLN nasal spray Place 1 spray into both nostrils as needed for congestion.     topiramate (TOPAMAX) 200 MG tablet Take 200 mg by mouth at bedtime.     traZODone (DESYREL) 150 MG tablet Take 150 mg by mouth at bedtime.  0   VITAMIN D PO Take 1 tablet by  mouth daily.     warfarin (COUMADIN) 6 MG tablet Take 6-9 mg by mouth daily. Take '9mg'$  Monday, Wednesday, and Fridays, then all other days take '6mg'$ s daily      Allergies as of 09/30/2020 - Review Complete 09/30/2020  Allergen Reaction Noted   Codeine  01/21/2020   No known allergies  01/08/2016    Family History  Problem Relation Age of Onset   Cancer Mother    Diabetes Mother     Social History   Socioeconomic History   Marital status: Single    Spouse name: Not on file   Number of children: 2   Years of education: Not on file   Highest education level: Not on file  Occupational History   Not on file  Tobacco Use   Smoking status: Never   Smokeless tobacco: Never  Vaping Use   Vaping Use: Never used  Substance and Sexual Activity   Alcohol use: No   Drug use: No    Sexual activity: Not on file  Other Topics Concern   Not on file  Social History Narrative   Not on file   Social Determinants of Health   Financial Resource Strain: Not on file  Food Insecurity: Not on file  Transportation Needs: Not on file  Physical Activity: Not on file  Stress: Not on file  Social Connections: Not on file  Intimate Partner Violence: Not on file    Review of Systems: All systems reviewed and negative except where noted in HPI.   OBJECTIVE    Physical Exam: Vital signs in last 24 hours: Temp:  [98 F (36.7 C)-99.4 F (37.4 C)] 98.2 F (36.8 C) (07/30 1500) Pulse Rate:  [68-84] 68 (07/30 1500) Resp:  [12-18] 14 (07/30 1500) BP: (111-130)/(52-80) 123/60 (07/30 1500) SpO2:  [94 %-100 %] 100 % (07/30 1500)   General:   Alert  female in NAD Psych:  Pleasant, cooperative. Normal mood and affect. Eyes:  Pupils equal, sclera clear, no icterus.   Conjunctiva pink. Ears:  Normal auditory acuity. Nose:  No deformity, discharge,  or lesions. Neck:  Supple; no masses Lungs:  Clear throughout to auscultation.   No wheezes, crackles, or rhonchi.  Heart:  Regular rate and rhythm;  1+ BLE edema Abdomen:  Soft, non-distended, nontender, BS active, no palp mass   Rectal:  Deferred  Msk:  Symmetrical without gross deformities. . Neurologic:  Alert and  oriented x4;  grossly normal neurologically. Skin:  Intact without significant lesions or rashes.     Scheduled inpatient medications  potassium chloride  40 mEq Oral BID   sodium chloride flush  3 mL Intravenous Q12H      Intake/Output from previous day: No intake/output data recorded. Intake/Output this shift: Total I/O In: 315 [Blood:315] Out: -    Lab Results: Recent Labs    09/30/20 1050  WBC 5.9  HGB 6.0*  HCT 20.6*  PLT 302   BMET Recent Labs    09/30/20 1050  NA 138  K 3.3*  CL 111  CO2 22  GLUCOSE 107*  BUN 12  CREATININE 0.67  CALCIUM 8.7*   LFT Recent Labs     09/30/20 1050  PROT 6.6  ALBUMIN 3.2*  AST 18  ALT 14  ALKPHOS 71  BILITOT 0.4   PT/INR Recent Labs    09/30/20 1300  LABPROT 26.6*  INR 2.5*   Hepatitis Panel No results for input(s): HEPBSAG, HCVAB, HEPAIGM, HEPBIGM in the last 72 hours.   Marland Kitchen  CBC Latest Ref Rng & Units 09/30/2020 05/10/2020 05/08/2020  WBC 4.0 - 10.5 K/uL 5.9 6.9 7.3  Hemoglobin 12.0 - 15.0 g/dL 6.0(LL) 11.5(L) 11.9(L)  Hematocrit 36.0 - 46.0 % 20.6(L) 35.3(L) 37.5  Platelets 150 - 400 K/uL 302 251 257    . CMP Latest Ref Rng & Units 09/30/2020 05/10/2020 05/08/2020  Glucose 70 - 99 mg/dL 107(H) 103(H) 92  BUN 8 - 23 mg/dL '12 23 11  '$ Creatinine 0.44 - 1.00 mg/dL 0.67 0.78 0.71  Sodium 135 - 145 mmol/L 138 139 138  Potassium 3.5 - 5.1 mmol/L 3.3(L) 3.5 3.6  Chloride 98 - 111 mmol/L 111 113(H) 108  CO2 22 - 32 mmol/L 22 19(L) 21(L)  Calcium 8.9 - 10.3 mg/dL 8.7(L) 9.1 9.2  Total Protein 6.5 - 8.1 g/dL 6.6 6.9 -  Total Bilirubin 0.3 - 1.2 mg/dL 0.4 0.2(L) -  Alkaline Phos 38 - 126 U/L 71 65 -  AST 15 - 41 U/L 18 19 -  ALT 0 - 44 U/L 14 18 -   Studies/Results: No results found.  Active Problems:   Symptomatic anemia    Tye Savoy, NP-C @  09/30/2020, 4:05 PM    Attending physician's note   I have taken an interval history, reviewed the chart and examined the patient. I agree with the Advanced Practitioner's note, impression and recommendations.   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 Heme -ve stools. Neg cologuard. Last colon at age 47- neg. Nl BUN/Cr GERD with occ eso dysphagia. H/O NSAID intake. CVA on coumadin. INR 2.5  Plan: -Agree with transfusion.  Maintain Hb>7 -Hold Coumadin and would let INR trend down. -Add Protonix. -EGD/colon Monday with Dr Mann/Hung.  Would like INR</= 1.8.   Carmell Austria, MD [facility] [phone]

## 2020-09-30 NOTE — ED Provider Notes (Signed)
[facility]ERG[facility]Y DEPARTMENT Provider Note   CSN: [id number] Arrival date & time: 09/30/20  1024     History Chief Complaint  Patient presents with   Dizziness   Shortness of Breath    Emily Peck is a 65 y.o. female with a hx of GERD, prior stroke, chronic anticoagulation on warfarin and guillain barre syndrome who presents to the ED due to abnormal outpatient labs. Patient reports dyspnea with exertion, palpitations, and intermittent lightheadedness especially with position changes over the past 2 weeks. No other specific alleviating/aggravating factors. Seen by PCP who drew labs yesterday- low hgb and was told to come to the ED. Patient denies fever, N/V, chest pain, abdominal pain, or syncope. She is unsure about abnormal stool- states she has not checked. Does not see GI. She is on warfarin and takes an 800 mg ibuprofen daily. Denies EtOH use. Colonoscopy @ age 68 was normal, had negative cologuard recently as well.  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   Nasal septal deviation 04/19/2019   Excessive daytime sleepiness 04/19/2019   Neuropathy 04/19/2019   Sleep-related headache 04/19/2019   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[place]) 04/19/2019   Rotator cuff syndrome of both shoulders 06/09/2018   Cervicalgia 03/19/2018   Disorder of rotator cuff syndrome of right shoulder and allied disorder 01/09/2018   Primary  osteoarthritis of right knee 01/09/2016   Peripheral polyneuropathy 10/19/2015   Knee pain, chronic 06/11/2013   Thoracic postlaminectomy syndrome 06/11/2013   Pes planus of both feet 06/11/2013   Trochanteric bursitis of both hips 11/06/2012   Lumbosacral spondylosis without myelopathy 05/09/2011   DEPRESSION 07/24/2006   CARPAL TUNNEL SYNDROME, RIGHT 07/24/2006   MYOPIA, 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     Family History  Problem Relation Age of Onset   Cancer Mother    Diabetes Mother     Social History   Tobacco Use   Smoking status: Never   Smokeless tobacco: Never  Vaping Use   Vaping Use: Never used  Substance Use Topics   Alcohol use: No   Drug use: No    Home Medications Prior to Admission medications   Medication Sig Start Date End Date Taking? Authorizing Provider  acetaminophen-codeine (TYLENOL #4) 300-60 MG tablet Take 1  tablet by mouth 4 (four) times daily as needed. for pain 09/22/20   Bayard Hugger, NP  Ascorbic Acid (VITAMIN C PO) Take 1 tablet by mouth daily.    [provider]  B Complex Vitamins (VITAMIN B COMPLEX PO) Take 1 tablet by mouth daily.    [provider]  Camphor-Eucalyptus-Menthol (VICKS VAPORUB EX) Apply topically. Uses on feet prn for pain  (family dollar brand)    [provider]  escitalopram (LEXAPRO) 20 MG tablet Take 20 mg by mouth at bedtime.  12/13/13   [provider]  furosemide (LASIX) 20 MG tablet Take 1 tablet (20 mg total) by mouth daily for 7 days. 05/10/20 05/17/20  Carmin Muskrat, MD  gabapentin (NEURONTIN) 600 MG tablet Take 2 tablets (1,200 mg total) by mouth 3 (three) times daily. 03/29/20   Lomax, Amy, NP  ibuprofen (ADVIL) 800 MG tablet  01/31/20   [provider]  Lido-Capsaicin-Men-Methyl Sal 0.5-0.035-5-20 % PTCH Place one patch on the bottom of each foot every night at bedtime 03/29/20   Lomax, Amy, NP  Liniments (BEN GAY EX) Apply 1 application topically as needed (joint pain).    [provider]  methocarbamol (ROBAXIN) 500 MG tablet Take 1 tablet (500 mg total) by mouth every 8 (eight) hours as needed for muscle spasms. 06/15/20   Bayard Hugger, NP  Multiple Vitamin (MULTIVITAMIN) tablet Take 1 tablet by mouth daily. OTC    [provider]  potassium chloride (KLOR-CON) 10 MEQ tablet Take 1 tablet by mouth daily. 01/04/19   [provider]  Propylene Glycol 0.6 % SOLN Place 1 drop into both eyes as needed (dry eyes).    [provider]  topiramate (TOPAMAX) 200 MG tablet Take 200 mg by mouth at bedtime. 03/30/20   [provider]  traZODone (DESYREL) 150 MG tablet Take 150 mg by mouth at bedtime. 07/13/14   [provider]  VITAMIN D PO Take 1 tablet by mouth daily.    [provider]  warfarin (COUMADIN) 6 MG tablet Take 6-9 mg by mouth daily. Take '9mg'$  Monday, Wednesday, and Fridays, then all other days take '6mg'$ s daily    [provider]    Allergies    Codeine and No known allergies  Review of Systems   Review of Systems  Constitutional:  Negative for chills and fever.  Respiratory:  Positive for shortness of breath.   Cardiovascular:  Positive for palpitations. Negative for chest pain.  Gastrointestinal:  Negative for abdominal pain, nausea and vomiting.  Neurological:  Positive for light-headedness. Negative for syncope.  All other systems  reviewed and are negative.  Physical Exam Updated Vital Signs BP (!) 115/52 (BP Location: Left Arm)   Pulse 83   Temp 99.4 F (37.4 C) (Oral)   Resp 16   SpO2 94%   Physical Exam Vitals and nursing note reviewed.  Constitutional:      General: She is not in acute distress.    Appearance: She is well-developed. She is not toxic-appearing.  HENT:     Head: Normocephalic and atraumatic.  Eyes:     Comments: R eye absent. Conjunctival pallor noted.   Cardiovascular:     Rate and Rhythm: Normal rate and regular rhythm.  Pulmonary:     Effort: Pulmonary effort is normal. No respiratory distress.     Breath sounds: Normal breath sounds. No wheezing, rhonchi or rales.  Abdominal:     General: There is no distension.  Palpations: Abdomen is soft.     Tenderness: There is no abdominal tenderness.  Genitourinary:    Comments: Chaperone present- NT DRE with soft brown stool, no BRBPR Musculoskeletal:     Cervical back: Neck supple.  Skin:    General: Skin is warm and dry.     Findings: No rash.  Neurological:     Mental Status: She is alert.     Comments: Clear speech.   Psychiatric:        Behavior: Behavior normal.    ED Results / Procedures / Treatments   Labs (all labs ordered are listed, but only abnormal results are displayed) Labs Reviewed  CBC WITH DIFFERENTIAL/PLATELET - Abnormal; Notable for the following components:      Result Value   RBC 2.07 (*)    Hemoglobin 6.0 (*)    HCT 20.6 (*)    MCHC 29.1 (*)    RDW 17.0 (*)    nRBC 0.3 (*)    All other components within normal limits  COMPREHENSIVE METABOLIC PANEL - Abnormal; Notable for the following components:   Potassium 3.3 (*)    Glucose, Bld 107 (*)    Calcium 8.7 (*)    Albumin 3.2 (*)    All other components within normal limits  POC OCCULT BLOOD, ED  TYPE AND SCREEN    EKG EKG Interpretation  Date/Time:  Saturday September 30 2020 10:44:37 EDT Ventricular Rate:  83 PR Interval:  156 QRS  Duration: 82 QT Interval:  384 QTC Calculation: 451 R Axis:   -5 Text Interpretation: Normal sinus rhythm Minimal voltage criteria for LVH, may be normal variant ( R in aVL ) Borderline ECG Confirmed by Octaviano Glow [phone]) on 09/30/2020 12:51:06 PM  Radiology No results found.  Procedures .Critical Care  Date/Time: 09/30/2020 1:24 PM Performed by: Amaryllis Dyke, PA-C Authorized by: Amaryllis Dyke, PA-C    CRITICAL CARE Performed by: Kennith Maes   Total critical care time: 35 minutes  Critical care time was exclusive of separately billable procedures and treating other patients.  Critical care was necessary to treat or prevent imminent or life-threatening deterioration.  Critical care was time spent personally by me on the following activities: development of treatment plan with patient and/or surrogate as well as nursing, discussions with consultants, evaluation of patient's response to treatment, examination of patient, obtaining history from patient or surrogate, ordering and performing treatments and interventions, ordering and review of laboratory studies, ordering and review of radiographic studies, pulse oximetry and re-evaluation of patient's condition.   Medications Ordered in ED Medications - No data to display  ED Course  I have reviewed the triage vital signs and the nursing notes.  Pertinent labs & imaging results that were available during my care of the patient were reviewed by me and considered in my medical decision making (see chart for details).  Clinical Course as of 09/30/20 1323  Sat Sep 30, 2020  1251 This is a 65 year old female on Coumadin for clotting disorder presenting to the ED with fatigue, lightheadedness ongoing for "a few weeks".  Vital signs of been stable in the ED.  She has a relatively benign abdominal and physical exam.  She does have conjunctival pallor.  Labs are notable for anemia with a hemoglobin of 6.0 (last  hgb was 11 four months ago).  Hemoccult was negative.  This is a normocytic anemia.  It is not clear what the underlying causes, although I do still suspect this may be related  to slow GI bleed given that she is on Coumadin.  The patient was consented for blood transfusion given that she was symptomatic.  She will be admitted to the hospital.  At this point I have a low suspicion for acute hemorrhage or shock. [MT]    Clinical Course User Index [MT] Wyvonnia Dusky, MD   MDM Rules/Calculators/A&P                           Patient presents to the ED with complaints of abnormal outpatient labs with dyspnea on exertion, palpitations, and lightheadedness x 2 weeks.   Additional history obtained:  Additional history obtained from chart review & nursing note review.   EKG: Normal sinus rhythm Minimal voltage criteria for LVH, may be normal variant ( R in aVL ) Borderline ECG   Lab Tests:  I reviewed and interpreted labs, which included:  CBC: Anemia w/ hgb 6.0 hct 20.6-normocytic.  Anemia panel sent. CMP: Mild hypokalemia and hypocalcemia.  Liver function test within normal limits.  ED Course:  Patient presents to the emergency department with symptomatic anemia for the past couple of weeks currently with a hemoglobin/hematocrit of 6.0/20.6.  Unclear definitive etiology to patient's anemia- Normocytic.  Anemia panel sent. Fecal occult testing is negative, there is no bright red blood or melena at this time. Will check PT/INR given she is on coumadin though.  Given she is symptomatic we will start blood transfusion and discussed with hospitalist service for admission.  Discussed with attending Dr. Langston Masker who has evaluated the patient and is in agreement.  13:24: CONSULT: Discussed with internal medicine residency service- accept admission.   Portions of this note were generated with Lobbyist. Dictation errors may occur despite best attempts at proofreading.  Final Clinical  Impression(s) / ED Diagnoses Final diagnoses:  Symptomatic anemia    Rx / DC Orders ED Discharge Orders     None        Amaryllis Dyke, PA-C 09/30/20 1325    Lajean Saver, MD 10/01/20 [phone]

## 2020-09-30 NOTE — ED Triage Notes (Addendum)
C/o feeling lightheaded and SOB x 2 weeks.  Seen by PCP yesterday and had labs.  Received call to come to ED this morning due to low Hgb.  Denies abd pain.  Denies blood in stool.

## 2020-09-30 NOTE — H&P (Signed)
Date: 09/30/2020               Patient Name:  Emily Peck MRN: [id number]  DOB: 04/20/1955 Age / Sex: 65 y.o., female   PCP: Pcp, No         Medical Service: Internal Medicine Teaching Service         Attending Physician: Dr. Sid Falcon, MD    First Contact: Dr. Elliot Gurney Pager: [phone]  Second Contact: Dr. 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 She thought she was sick with pneumonia and went to the doctor yesterday and found to have a low Hgb. Instructed to come in today for symptomatic anemia.  No reported melena, hematochezia, no straining with BM, no constipation. Not sure how often she goes. Denies abd pain and no reported blood in her stool. Colonoscopy at 50 which was normal and Cologuard a few weeks ago negative. States that she has "very bad acid reflux". She has a had an EGD 20 years ago and cant remember what they found. She takes Ibuprofen 800 mg BID for 2-3 years  She denies any N/V, CP, abd pain, or syncope. She states that she gets really bad "muscle spasms" on either side of her abdomen that have been happening more frequently lately.  Frequent nose bleeding, "basically all of her life". Has had some sinus problems lately, and that make her feel like her nose bleeds more. 2-3 nose bleeds per weeks, puts tissue in her nose to clog it up, unsure how long.  Meds: Warfarin Tylenol Lexipro Trazadone Gabapentin Topiramate  Ibuprofen 800 BID Current Meds  Medication Sig   acetaminophen-codeine (TYLENOL #4) 300-60 MG tablet Take 1 tablet by mouth 4 (four) times daily as needed. for pain (Patient  taking differently: Take 1 tablet by mouth 4 (four) times daily as needed for moderate pain.)   Ascorbic Acid (VITAMIN C PO) Take 1 tablet by mouth daily.   B Complex Vitamins (VITAMIN B COMPLEX PO) Take 1 tablet by mouth daily.   Camphor-Eucalyptus-Menthol (VICKS VAPORUB EX) Apply topically. Uses on feet prn for pain  (family dollar brand)   diphenhydrAMINE (BENADRYL) 25 MG tablet Take 25-50 mg by mouth 4 (four) times daily as needed for itching. Takes with Tylenol #4 due to codeine allergy.   escitalopram (LEXAPRO) 20 MG tablet Take 20 mg by mouth at bedtime.    furosemide (LASIX) 20 MG tablet Take 1 tablet (20 mg total) by mouth daily for 7 days. (Patient taking differently: Take 20 mg by mouth daily as needed for fluid.)   gabapentin (NEURONTIN) 600 MG tablet Take 2 tablets (1,200 mg total) by mouth 3 (three) times daily.   ibuprofen (ADVIL) 800 MG tablet Take 800 mg by mouth 2 (two) times daily as needed for mild pain.   Lido-Capsaicin-Men-Methyl Sal 0.5-0.035-5-20 % PTCH Place one patch on the bottom of each foot every night at bedtime   methocarbamol (ROBAXIN) 500 MG tablet Take 1 tablet (500 mg total) by mouth every 8 (eight) hours as needed for muscle spasms.   potassium chloride (KLOR-CON) 10 MEQ tablet Take 10 mEq  by mouth daily.   Propylene Glycol 0.6 % SOLN Place 1 drop into both eyes as needed (dry eyes).   sodium chloride (OCEAN) 0.65 % SOLN nasal spray Place 1 spray into both nostrils as needed for congestion.   topiramate (TOPAMAX) 200 MG tablet Take 200 mg by mouth at bedtime.   traZODone (DESYREL) 150 MG tablet Take 150 mg by mouth at bedtime.   VITAMIN D PO Take 1 tablet by mouth daily.   warfarin (COUMADIN) 6 MG tablet Take 6-9 mg by mouth daily. Take '9mg'$  Monday, Wednesday, and Fridays, then all other days take '6mg'$ s daily     Allergies: Allergies as of 09/30/2020 - Review Complete 09/30/2020  Allergen Reaction Noted   Codeine  01/21/2020   No known allergies  01/08/2016    Past Medical History:  Diagnosis Date   Anxiety    Clotting disorder ([facility])    Depression    Dyspnea    GERD (gastroesophageal reflux disease)    Headache    Lumbosacral spondylosis without myelopathy    Neuropathy    Postlaminectomy syndrome, lumbar region    Postlaminectomy syndrome, thoracic region    Stroke [facility])     Family History: none reported  Social History: Denies any ETOH or tobacco no drug  Lives with Son and helps her as she is visually impaired.  Review of Systems: Has chronic back pain with multiple operations and has been worse lately and takea pain medicine 4 times a day   Physical Exam: Blood pressure 123/60, pulse 68, temperature 98.2 F (36.8 C), temperature source Oral, resp. rate 14, SpO2 100 %. Physical Exam Constitutional:      Appearance: She is obese.  HENT:     Head: Normocephalic and atraumatic.  Eyes:     Comments: Enucleation right Conjunctival pallor  Cardiovascular:     Rate and Rhythm: Normal rate and regular rhythm.  Pulmonary:     Effort: Pulmonary effort is normal.     Breath sounds: Normal breath sounds.  Abdominal:     General: Bowel sounds are normal.     Palpations: Abdomen is soft.  Musculoskeletal:     Cervical back: Neck supple.     Right lower leg: Tenderness present.     Left lower leg: Tenderness present.  Skin:    General: Skin is warm and dry.  Neurological:     General: No focal deficit present.     Mental Status: She is alert and oriented to person, place, and time.  Psychiatric:        Mood and Affect: Mood normal.        Behavior: Behavior normal.     EKG: personally reviewed my interpretation is normal sinus  CXR: personally reviewed my interpretation is none  Assessment & Plan by Problem: Active Problems:   Symptomatic anemia  #symptomatic iron deficiency anemia - 6.0 on admission with DOE, palpitations, lightheadedness.  Baseline 11.  - FOBT negative. Patient has not looked to see if any  change in stool color. Most recent colonoscopy 16 years ago. Most recent EGD 20 years ago - reports very bad acid reflux, not taking any PPI. On warfarin and has been taking Ibuprofen '800mg'$  BID for past 2-3 years.  Other potential causes include frequent nosebleeds for many years, though she reports that she is able to stop the bleeding quickly each time. No reported large episode of epistaxis that would explain sudden drop in hgb. - receiving 1 unit pRBCs and f/u cbc -  monitor with cbc - GI consulted, will follow up their recommendations   # dysphagia - dysphagia diet - dysphagia to solids, able to tolerate liquids - SLP eval  Hx of stroke on Warfarin Dural venous sinus started on warfarin 2004.   Hypokalemia - 3.3 on admission. Repleted. - monitor.  GERD - protonix  Post-laminectomy pain - back pain and takes '800mg'$  tylenol BID at home. - lidocaine patch and tylenol  Anxiety depression  Dispo: Admit patient to Inpatient with expected length of stay greater than 2 midnights.  Signed: Delene Ruffini, MD 09/30/2020, 4:11 PM  Pager: [phone] After 5pm on weekdays and 1pm on weekends: On Call pager: [phone]

## 2020-09-30 NOTE — ED Provider Notes (Signed)
Emergency Medicine Provider Triage Evaluation Note  Emily Peck , a 65 y.o. female  was evaluated in triage.  Pt sent by her primary care due to abnormal labs. She was told her hgb was low.  Says that for the past 2 weeks she has become increasingly short of breath and light headed.  Has required transfusion in the past Review of Systems  Positive: SOB, fatigue, dizziness. Negative: CP, palpitations, symcope.  Physical Exam  BP (!) 115/52 (BP Location: Left Arm)   Pulse 83   Temp 99.4 F (37.4 C) (Oral)   Resp 16   SpO2 94%  Gen:   Awake, no distress . Resp:  Normal effort. MSK:   Moves extremities without difficulty.   Medical Decision Making  Medically screening exam initiated at 10:49 AM.  Appropriate orders placed.  Emily Peck was informed that the remainder of the evaluation will be completed by another provider, this initial triage assessment does not replace that evaluation, and the importance of remaining in the ED until their evaluation is complete.    Rhae Hammock, PA-C 09/30/20 1049    Lajean Saver, MD 09/30/20 1302

## 2020-09-30 NOTE — Hospital Course (Addendum)
7/30 - 1 unit transfused. GI consulted, planning EGD and colon Monday.         Couple week history of SHOB with exertion, and generalized weakness, and palpitations She thought she was sick with pneumonia and went to the doctor yesterday and found to have a low Hgb She denies any obvious signs of bleeding, no straining, no constipation?? No sure how often she goes Frequent nose bleeding, "basically all of her life" Has had some sinus problems lately, and that make her feel like her nose bleeds more. 2-3 nose bleeds per weeks, puts tissue in her nose to clog it up, unsure how long.  Has chronic back pain with multiple operations and has been worse lately and takea pain medicine 4 times a day  States that she has "very bad acid reflux". 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   PMH: back problems,   Warfarin Tylenol Lexipro Trazadone Gabapentin Topiramate  Ibuprofen??  Past Medical History:  Diagnosis Date   Anxiety    Clotting disorder ([place])    Depression    Dyspnea    GERD (gastroesophageal reflux disease)    Headache    Lumbosacral spondylosis without myelopathy    Neuropathy    Postlaminectomy syndrome, lumbar region    Postlaminectomy syndrome, thoracic region    Stroke (HCC)    Hgb of 6.0 elevated RDW   2 weeks light headed, shortness of breath with exertion, palpatations FOBT 9(-) Colonoscopy  65 yo that was negative and recent cologaurd that was negative.    CLINICAL DATA:  PATIENT  WITH SEVERE HEADACHES, NAUSEA WITH EDEMA OF THE RIGHT FRONTOPARIETAL CORTEX.  CAROTID AND CEREBRAL ARTERIOGRAMS  FOLLOWING A FULL EXPLANATION OF THE PROCEDURE,  ALONG WITH THE POTENTIAL ASSOCIATED COMPLICATIONS,  AND INFORMED WITNESSED CONSENT WAS OBTAINED.  THE RIGHT GROIN WAS PREPPED AND DRAPED IN THE USUAL STERILE FASHION.  THEREAFTER USING THE MODIFIED  SELDINGER TECHNIQUE, TRANSFEMORAL ACCESS INTO THE RIGHT COMMON FEMORAL ARTERY WAS OBTAINED WITHOUT  ANY DIFFICULTY.  OVER A .035 INCH GUIDEWIRE, A 5 Halfway.  THROUGH THIS, AND ALSO OVER A .035 INCH GUIDEWIRE, A 5 FRENCH JB-I CATHETER WAS ADVANCED INTO THE  AORTIC ARCH REGION AND SELECTIVE CANNULATION ARTERIOGRAMS WERE PERFORMED OF THE RIGHT COMMON  CAROTID ARTERY, THE RIGHT VERTEBRAL ARTERY, THE LEFT COMMON CAROTID ARTERY AND THE LEFT VERTEBRAL  ARTERY, IN THAT ORDER.  THERE WERE NO ACUTE COMPLICATIONS. THE PATIENT TOLERATED THE PROCEDURE WELL.  MEDICATIONS UTILIZED:  VERSED 2 MG IV, 75  MCG IV.  CONTRAST UTILIZED:  OMNIPAQUE 300, APPROXIMATELY 80 CC.  FINDINGS:  THE RIGHT COMMON CAROTID ARTERIOGRAM  DEMONSTRATES THE RIGHT EXTERNAL CAROTID ARTERY  ORIGIN AND BRANCHES TO BE NORMAL.  THE INTERNAL CAROTID ARTERY AT THE BULB END DISTALLY IS NORMAL.  THE PETROUS, THE CAVERNOUS AND THE SUPRACLINOID ICA ARE INTACT.  THE RIGHT MIDDLE AND THE RIGHT ANTERIOR CEREBRAL ARTERIES OPACIFY NORMALLY INTO THE  CAPILLARY PHASE.  THE EARLY VENOUS PHASE DEMONSTRATES PATCHY OPACIFICATION OF THE POSTERIOR HALF OF THE SUPERIOR  SAGITTAL SINUS WITH SIGNIFICANTLY DELAYED ATTENUATION AND FLOW OF THE ANTERIOR HALF OF THE SUPERIOR  SAGITTAL SINUS.  ALSO NOTED IS THE LACK OF OPACIFICATION OF THE CORTICAL VEINS IN THE ANTERIOR HALF OF THE FRONTAL  LOBE CONVEXITY AND ALSO THE RIGHT PARIETAL CORTICAL CONVEXITY.  FILLING DEFECTS ARE ALSO SEEN IN  THE POSTERIOR HALF OF THE SUPERIOR SAGITTAL SINUS.  THE DELAYED IMAGES DEMONSTRATE OPACIFICATION OF  THE TRANSVERSE SINUSES AND THE SIGMOID SINUSES AND  THE INTERNAL JUGULAR VEINS.  ALSO DEMONSTRATED IS OPACIFICATION OF THE STRAIGHT SINUS, THE INTERNAL  CEREBRAL VEINS, THE THALAMOSTRIATE, THE VEIN OF GALEN  AND THE INFERIOR SAGITTAL SINUS.  THE RIGHT VERTEBRAL  ARTERY ORIGIN IS NORMAL.  THIS VESSEL ASCENDS NORMALLY TO THE CRANIAL SKULL  BASE.  THERE IS NORMAL OPACIFICATION OF THE  RIGHT POSTERIOR/INFERIOR CEREBRAL ARTERY, THE BASILAR  ARTERY, THE  POSTERIOR CEREBELLAR ARTERIES, THE SUPERIOR CEREBELLAR ARTERIES, AND THE INFERIOR  CEREBELLAR ARTERIES INTO THE CAPILLARY AND VENOUS PHASES.  THE LEFT COMMON CAROTID ARTERIOGRAM DEMONSTRATES THE LEFT EXTERNAL CAROTID ARTERY ORIGIN AND  BRANCHES TO BE NORMAL.  THE LEFT INTERNAL CAROTID ARTERY AT THE BULB END DISTALLY IS NORMAL. THE PETROUS, CAVERNOUS AND THE   SUPRACLINOID SEGMENTS ARE NORMAL.  THE LEFT MIDDLE AND THE LEFT ANTERIOR CEREBRAL ARTERIES OPACIFY NORMALLY INTO THE CAPILLARY PHASES.  THERE IS SLIGHTLY DELAYED CORTICAL VENOUS DRAINAGE OVERLYING THE CERVICAL CONVEXITY INTO THE  SUPERIOR SAGITTAL SINUS.  HOWEVER, THIS IS CORTICAL VENOUS OPACIFICATION OVERLYING THE CEREBRAL  CONVEXITIES THROUGHOUT THE ENTIRETY OF THE SUPERIOR SAGITTAL SINUS.  THE SUPERIOR SAGITTAL SINUS  FLOW REMAINS ATTENUATED IN ITS ANTERIOR ONE HALF.  THE LEFT VERTEBRAL  ARETRY  ORIGIN IS NORMAL.  THE VESSEL ASCENDS NORMALLY INTO THE CRANIAL SKULL  BASE.  THERE IS NORMAL OPACIFICATION OF THE LEFT POSTERIOR/INFERIOR CEREBELLAR ARTERY, THE  VERTEBROBASILAR JUNCTION, THE BASILAR ARTERY,  POSTERIOR CEREBRAL ARTERIES, THE SUPERIOR CEREBELLAR  ARTERIES AND THE INFERIOR/INFERIOR CEREBELLAR ARTERIES INTO THE CAPILLARY AND VENOUS PHASES.  IMPRESSION  LACK OF OPACIFICATION OF THE CORTICAL VEINS OVERLYING THE RIGHT FRONTAL CERVICAL CONVEXITY, AND THE  RIGHT ANTERIOR PARIETAL CEREBRAL CONVEXITY.  FILLING DEFECTS IN THE POSTERIOR HALF OF THE SUPERIOR SAGITTAL SINUS ON THE RIGHT CAROTID  ARTERY  INJECTION WITH ATTENUATED CALIBER OF THE SUPERIOR SAGITTAL SINUS AT THE ANTERIOR HALF.  THE ABOVE FINDINGS ARE HIGHLY SUSPICIOUS OF CORTICAL VENOUS THROMBOSIS WITH SECONDARY DELAYED  OPACIFICATION OF THE DURAL SINUS ON THE RIGHT SIDE, ESPECIALLY THE ANTERIOR HALF.  FILLING DEFECTS IN THE POSTERIOR HALF OF THE SUPERIOR SAGITTAL SINUS SUSPICIOUS OF TRAUMA.  NO ANEURYSMS, ARTERIAL VENOUS MALFORMATIONS OR DURAL ARTERIAL VENOUS FISTULA OR OCCLUSIONS ARE SEEN.  FINDINGS  CLINICAL DATA:  HEADACHE.  MR BRAIN WITH CONTRAST  PATIENT INITIALLY HAD MR SCAN WITHOUT CONTRAST ON 03/31/02, RETURNING FOR POST-CONTRAST ENHANCED  IMAGING 04/01/02.  AT [facility] TIME, IN ADDITION TO THE POST CONTRAST ENHANCED IMAGES, THIN SECTION  FLAIR AND CORONAL SEQUENCE AND THIN SECTION DIFFUSION SEQUENCE THROUGH THE RIGHT PARIETAL REGIONS  WERE ALSO OBTAINED.  THERE REMAINS ABNORMAL APPEARANCE IN THE RIGHT PARIETAL REGION WITH GYRIFORM SLIGHT THICKENING OF  GRAY MATTER AND LOSS OF SULCI WITHOUT ABNORMAL ENHANCEMENT WITH MINIMAL LINEAR HYPERINTENSITY ON  DIFFUSION-WEIGHTED SEQUENCE.  THIS IS NONSPECIFIC WITH REGARD TO ETIOLOGY AND MAY BE RELATED TO  SUPERFICIAL CORTICAL VEIN THROMBOSIS RATHER THAN ARTERIAL INFARCT.  OTHER CONSIDERATIONS CANNOT BE  COMPLETELY EXCLUDED INCLUDING CHANGES RELATED TO UNDERLYING:  INFECTION/CEREBRITIS, VASCULITIS,  POST-TRAUMATIC CHANGES, SUBARACHNOID HEMORRHAGE, INFLAMMATORY PROCESS (i.e.,  VASCULITIS/SARCOIDOSIS) OR LOW GRADE GLIOMA.  THE PRIME CONSIDERATION IS THAT OF A SUPERFICIAL  CORTICAL VEIN THROMBOSIS (OR GIVEN THE PATIENTS NECK PAIN, ARTERIAL DISSECTION).  CEREBROSPINAL  FLUID HAS BEEN COLLECTED AND CORRELATION WITH SUCH RECOMMENDED TO EXCLUDE SUBARACHNOID HEMORRHAGE  OR INFECTION.  TO EXCLUDE LOW GRADE TUMOR, FOLLOW-UP MR SCAN IN THE NEXT 4 TO 8 WEEKS IS  RECOMMENCED.  ONCE AGAIN NOTED ARE NONSPECIFIC WHITE MATTER TYPE CHANGES IN PERIVENTRICULAR AND SUBCORTICAL  REGION AS WELL AS IN  THE PONS.  ETIOLOGY INDETERMINATE WITH CONSIDERATIONS AS PREVIOUSLY DESCRIBED.  IMPRESSION  PERSISTENT ABNORMALITY RIGHT PARIETAL REGION WITHOUT ABNORMAL ENHANCEMENT AS NOTED ABOVE.   1. 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  Therapeutic on Coumadin.  2. Streptococcal pharyngitis group A with mild cervical lymphadenopathy     improved on intravenous antibiotics.  3. Neck pain.  Etiology uncertain, but likely related to 1 and 2 above.

## 2020-10-01 DIAGNOSIS — D649 Anemia, unspecified: Secondary | ICD-10-CM

## 2020-10-01 DIAGNOSIS — K2950 Unspecified chronic gastritis without bleeding: Secondary | ICD-10-CM | POA: Diagnosis not present

## 2020-10-01 LAB — TYPE AND SCREEN
ABO/RH(D): O POS
Antibody Screen: NEGATIVE
Unit division: 0

## 2020-10-01 LAB — CBC
HCT: 23.3 % — ABNORMAL LOW (ref 36.0–46.0)
HCT: 25 % — ABNORMAL LOW (ref 36.0–46.0)
Hemoglobin: 7.1 g/dL — ABNORMAL LOW (ref 12.0–15.0)
Hemoglobin: 7.4 g/dL — ABNORMAL LOW (ref 12.0–15.0)
MCH: 28.9 pg (ref 26.0–34.0)
MCH: 28.4 pg (ref 26.0–34.0)
MCHC: 30.5 g/dL (ref 30.0–36.0)
MCHC: 29.6 g/dL — ABNORMAL LOW (ref 30.0–36.0)
MCV: 94.7 fL (ref 80.0–100.0)
MCV: 95.8 fL (ref 80.0–100.0)
Platelets: 276 10*3/uL (ref 150–400)
Platelets: 312 10*3/uL (ref 150–400)
RBC: 2.46 MIL/uL — ABNORMAL LOW (ref 3.87–5.11)
RBC: 2.61 MIL/uL — ABNORMAL LOW (ref 3.87–5.11)
RDW: 16.9 % — ABNORMAL HIGH (ref 11.5–15.5)
RDW: 16.9 % — ABNORMAL HIGH (ref 11.5–15.5)
WBC: 8.3 10*3/uL (ref 4.0–10.5)
WBC: 7 10*3/uL (ref 4.0–10.5)
nRBC: 0.4 % — ABNORMAL HIGH (ref 0.0–0.2)
nRBC: 0.3 % — ABNORMAL HIGH (ref 0.0–0.2)

## 2020-10-01 LAB — BPAM RBC
Blood Product Expiration Date: 202208312359
ISSUE DATE / TIME: 202207301414
Unit Type and Rh: 5100

## 2020-10-01 LAB — PROTIME-INR
INR: 2.3 — ABNORMAL HIGH (ref 0.8–1.2)
Prothrombin Time: 25.4 seconds — ABNORMAL HIGH (ref 11.4–15.2)

## 2020-10-01 LAB — BASIC METABOLIC PANEL
Anion gap: 4 — ABNORMAL LOW (ref 5–15)
BUN: 8 mg/dL (ref 8–23)
CO2: 20 mmol/L — ABNORMAL LOW (ref 22–32)
Calcium: 8 mg/dL — ABNORMAL LOW (ref 8.9–10.3)
Chloride: 115 mmol/L — ABNORMAL HIGH (ref 98–111)
Creatinine, Ser: 0.62 mg/dL (ref 0.44–1.00)
GFR, Estimated: >60 mL/min (ref >60)
Glucose, Bld: 83 mg/dL (ref 70–99)
Potassium: 3.8 mmol/L (ref 3.5–5.1)
Sodium: 139 mmol/L (ref 135–145)

## 2020-10-01 MED ORDER — PEG-KCL-NACL-NASULF-NA ASC-C 100 G PO SOLR
0.5000 | Freq: Once | ORAL | Status: AC
  Administered 2020-10-02: 100 g via ORAL
  Filled 2020-10-01: qty 1

## 2020-10-01 MED ORDER — ACETAMINOPHEN-CODEINE #4 300-60 MG PO TABS: 1.0000 | ORAL_TABLET | Freq: Four times a day (QID) | ORAL | Status: DC | PRN

## 2020-10-01 MED ORDER — PEG-KCL-NACL-NASULF-NA ASC-C 100 G PO SOLR
0.5000 | Freq: Once | ORAL | Status: AC
  Administered 2020-10-01: 100 g via ORAL
  Filled 2020-10-01: qty 1

## 2020-10-01 MED ORDER — PEG-KCL-NACL-NASULF-NA ASC-C 100 G PO SOLR: 1.0000 | Freq: Once | ORAL | Status: DC

## 2020-10-01 MED ORDER — MORPHINE SULFATE (PF) 2 MG/ML IV SOLN: 2.0000 mg | INTRAVENOUS | Status: DC | PRN

## 2020-10-01 NOTE — Progress Notes (Signed)
HD#0 SUBJECTIVE:  Patient Summary: Emily Peck is a 65 y.o. 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   Overnight Events: no acute events overnight    Interm History: Patient seen and evaluated at bedside. She report sthat she sfeeling well this morning. She endorses some lightheadedness, chest pain and palpitations and shortness of breath when she gets up to ambulate.  OBJECTIVE:  Vital Signs: Vitals:   10/01/20 0300 10/01/20 0400 10/01/20 0500 10/01/20 0600  BP: (!) 108/50 (!) 125/56 (!) 133/56 106/77  Pulse: 65 67 80 69  Resp: '18 11 15 13  '$ Temp:      TempSrc:      SpO2: 100% 99% 100% 98%   Supplemental O2: Room Air SpO2: 98 %  There were no vitals filed for this visit.   Intake/Output Summary (Last 24 hours) at 10/01/2020 0646 Last data filed at 09/30/2020 1955 Gross per 24 hour  Intake 811.67 ml  Output 300 ml  Net 511.67 ml   Net IO Since Admission: 511.67 mL [10/01/20 0646]  Physical Exam: Physical Exam Constitutional:      Appearance: She is obese.  HENT:     Head: Normocephalic and atraumatic.  Eyes:     Comments: Enucleation right Conjunctival pallor, improving from prior  Cardiovascular:     Rate and Rhythm: Normal rate and regular rhythm.  Pulmonary:     Effort: Pulmonary effort is normal.     Breath sounds: Normal breath sounds.  Abdominal:     General: Bowel sounds are normal.     Palpations: Abdomen is soft.  Neurological:     General: No focal deficit present.     Mental Status: She is alert and oriented to person, place, and time.  Psychiatric:        Mood and Affect: Mood normal.        Behavior: Behavior normal.    Patient Lines/Drains/Airways Status     Active Line/Drains/Airways     Name Placement date Placement time Site Days    Peripheral IV 09/30/20 20 G 1.16" Right;Anterior;Distal Forearm 09/30/20  1303  Forearm  1            Pertinent Labs: CBC Latest Ref Rng & Units 10/01/2020 09/30/2020 09/30/2020  WBC 4.0 - 10.5 K/uL 8.3 7.5 5.9  Hemoglobin 12.0 - 15.0 g/dL 7.1(L) 7.7(L) 6.0(LL)  Hematocrit 36.0 - 46.0 % 23.3(L) 25.6(L) 20.6(L)  Platelets 150 - 400 K/uL 276 297 302    CMP Latest Ref Rng & Units 10/01/2020 09/30/2020 05/10/2020  Glucose 70 - 99 mg/dL 83 107(H) 103(H)  BUN 8 - 23 mg/dL '8 12 23  '$ Creatinine 0.44 - 1.00 mg/dL 0.62 0.67 0.78  Sodium 135 - 145 mmol/L 139 138 139  Potassium 3.5 - 5.1 mmol/L 3.8 3.3(L) 3.5  Chloride 98 - 111 mmol/L 115(H) 111 113(H)  CO2 22 - 32 mmol/L 20(L) 22 19(L)  Calcium 8.9 - 10.3 mg/dL 8.0(L) 8.7(L) 9.1  Total Protein 6.5 - 8.1 g/dL - 6.6 6.9  Total Bilirubin 0.3 - 1.2 mg/dL - 0.4 0.2(L)  Alkaline Phos 38 - 126 U/L - 71 65  AST 15 - 41 U/L - 18 19  ALT 0 - 44 U/L - 14 18    No results for input(s): GLUCAP in the last 72 hours.   Pertinent Imaging: No  results found.  ASSESSMENT/PLAN:  Assessment: Principal Problem:   Symptomatic anemia   Emily Peck is a 65 y.o. with pertinent PMH of GERD, prior dural venous sinus thrombosis on Warfarin and Guillan barre who presents with 2 weeks of DOE, weakness, palpitations, and intermittent lightheadedness admitted for workup of susymptomatic anemia on hospital day 0  Plan: #symptomatic iron deficiency anemia - 6.0 on admission with DOE, palpitations, lightheadedness.  Baseline 11. - FOBT negative. Patient has not looked to see if any change in stool color. Most recent colonoscopy 16 years ago. Most recent EGD 20 years ago - reports very bad acid reflux, not taking any PPI. On warfarin and has been taking Ibuprofen '800mg'$  BID for past 2-3 years.  Other potential causes include frequent nosebleeds for many years, though she reports that she is able to stop the bleeding quickly each time. No reported large episode of  epistaxis that would explain sudden drop in hgb. - 1 unit PRBC with improvement to 7.7. hgb down to 7.1 with improvement to 7.4 on repeat cbc - monitor with cbc - GI planning EGD when INR less than 2, likely Monday  - BID protonix - Clear liquid diet     # dysphagia - possibly severe esophagitis, r/o peptic stricture - GI to evaluate during EGD - dysphagia diet/ clear liquids - dysphagia to solids, able to tolerate liquids - f/u SLP eval   Hx of stroke on Warfarin Dural venous sinus started on warfarin 2004.   Hypokalemia - 3.8 on admission. Repleted and improved. - monitor.   GERD - protonix   Post-laminectomy pain - tylenol #4 for moderate pain, IV morphine for severe pain if not controlled with tylenol   Anxiety depression  Best Practice: Diet: Clear liquid diet and dysphagia IVF: Fluids: none, Rate: None VTE: SCDs Start: 09/30/20 1432 Code: Full AB: none   Signature: Delene Ruffini, MD  Internal Medicine Resident, PGY-1 [facility]  Pager: [phone] 6:46 AM, 10/01/2020   Please contact the on call pager after 5 pm and on weekends at [phone].

## 2020-10-01 NOTE — ED Notes (Signed)
Attempted to give report, unable to receive at this time

## 2020-10-01 NOTE — ED Notes (Signed)
Spoke with pts son Nicole Kindred. He would like to be updated with any changes [phone]

## 2020-10-01 NOTE — ED Notes (Signed)
Lunch tray delivered.

## 2020-10-01 NOTE — ED Notes (Signed)
Breakfast tray delivered

## 2020-10-01 NOTE — Progress Notes (Signed)
Received pt from ED. Pt GCS 15, no apparent sob or distress, pt denies feeling lightheaded upon arrival. Bed alarm activated and pt instructed on fall risk precautions

## 2020-10-01 NOTE — Progress Notes (Signed)
  Date: 10/01/2020  Patient name: Emily Peck  Medical record number: [id number]  Date of birth: 17-Dec-1955   I have seen and evaluated [name] and discussed their care with the Residency Team. Briefly, Ms. Prevette is a 65 year old woman with PMH of GERD, dural venous thrombus on coumadin who presented with symptomatic anemia in the setting of daily NSAID use and coumadin use.  She is also noted to have solid food dysphagia with food getting stuck and GERD symptoms.  GI was consulted and plan for EGD when INR is < 2.0.   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  Conjunctivae do not appear pail.  Anicteric on the left.  HENT: Neck is supple, MMM CV: RR, NR, faint 2/6 systolic murmur at the LUSB, trace pedal edema.  Leg pain to palpation.  Pulm: Breathing comfortably on room air.  No wheezing Abd: Soft, +BS, ND MSK: She has tender extremities to light tough, normal tone and bulk Skin: Chronic skin changes in the lower extremities, no rash or wounds on exposed skin Psych: Pleasant mood, reports pain.   Assessment and Plan: I have seen and evaluated the patient as outlined above. I agree with the formulated Assessment and Plan as detailed in the residents' note, with the following changes:   Symptomatic anemia Upper GI symptoms Dysphagia - GI is following, plan for endoscopy after coumadin < 2 - Hold coumadin, start heparin after INR < 2 for dural venous thrombosis - SLP consult for swallowing, esophageal symptoms - Trend Hgb, this AM was 7.1, will recheck, patient feels improved from her anemia stand point - Transfuse to keep Hgb > 7 - Hold NSAIDs - PPI started - Change to clear diet this afternoon in anticipation of EGD, NPO at [place]  Chronic pack pain - Resume tylenol - reported allergy to  codeine of itching, she is also on benadryl at home and takes Tylenol #4 - Consider starting IV narcotic for short term given she will no longer be on NSAIDs and holding tylenol #4   Other issues per Dr. Josephina Gip daily note.   Sid Falcon, MD 7/31/20228:36 AM

## 2020-10-01 NOTE — ED Notes (Signed)
Attempted to give report-no answer.

## 2020-10-01 NOTE — ED Notes (Signed)
Attempted to call report, unable to receive at this time

## 2020-10-01 NOTE — Evaluation (Signed)
Clinical/Bedside Swallow Evaluation Patient Details  Name: Emily Peck MRN: [id number] Date of Birth: [birth year]  Today's Date: 10/01/2020 Time: SLP Start Time (ACUTE ONLY): 1215 SLP Stop Time (ACUTE ONLY): 1230 SLP Time Calculation (min) (ACUTE ONLY): 15 min  Past Medical History:  Past Medical History:  Diagnosis Date   Anxiety    Clotting disorder (HCC)    Depression    Dyspnea    GERD (gastroesophageal reflux disease)    Headache    Lumbosacral spondylosis without myelopathy    Neuropathy    Postlaminectomy syndrome, lumbar region    Postlaminectomy syndrome, thoracic region    Stroke [facility])    Past Surgical History:  Past Surgical History:  Procedure Laterality Date   ABDOMINAL HYSTERECTOMY     BACK SURGERY     EYE SURGERY     eye removed right    KNEE ARTHROSCOPY     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 Patient reported she has h/o "very bad acid reflux" and last EGD was about 20 years ago. GI has seen patient during current admission and recommendation is for EGD/colonoscopy on 8/1.   Assessment / Plan / Recommendation Clinical Impression  Patient presents with suspected primary esophageal dysphagia but cannot r/o pharyngeal component. When consuming straw sips of plain water (thin consistency), swallow initiation appeared timely and no overt s/s aspiration or penetration observed. Solids were not trialed secondary to patient's GERD and currently patient reports only tolerating liquids. As patient is already being followed by GI and is to have EGD/colonscopy next date (8/1), SLP to defer any further ST  interventions until after EGD results and decision can be made at that time if an objective swallow test. (MBS ) is needed. SLP Visit Diagnosis: Dysphagia, unspecified (R13.10)    Aspiration Risk  Mild aspiration risk    Diet Recommendation Thin liquid   Liquid Administration via: Cup;Straw Medication Administration: Crushed with puree Supervision: Patient able to self feed Compensations: Slow rate;Small sips/bites Postural Changes: Remain upright for at least 30 minutes after po intake;Seated upright at 90 degrees    Other  Recommendations Oral Care Recommendations: Oral care BID   Follow up Recommendations Other (comment) (TBD pending EGD)      Frequency and Duration min 1 x/week  1 week       Prognosis Prognosis for Safe Diet Advancement: Good      Swallow Study   General Date of Onset: 09/30/20 HPI: Patient is a 65 y.o. 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 Patient reported she has h/o "very bad acid reflux" and last EGD was about 20 years ago. GI has seen patient during current admission and recommendation is for EGD/colonoscopy on 8/1. Type of Study: Bedside Swallow Evaluation Previous Swallow Assessment: None found Diet Prior to this Study: Thin liquids;Other (Comment) (clear liquids) Temperature Spikes Noted: No Respiratory Status: Room air History of Recent Intubation: No Behavior/Cognition: Alert;Pleasant mood;Cooperative Oral Cavity Assessment: Within Functional Limits Oral Care Completed by SLP: No Oral Cavity - Dentition: Adequate natural dentition  Vision: Functional for self-feeding Self-Feeding Abilities: Able to feed self Patient Positioning: Upright in bed Baseline Vocal Quality: Normal Volitional Cough: Strong Volitional Swallow: Able to elicit    Oral/Motor/Sensory Function Overall Oral  Motor/Sensory Function: Within functional limits   Ice Chips     Thin Liquid Thin Liquid: Within functional limits Presentation: Self Fed;Straw    Nectar Thick     Honey Thick     Puree Puree: Not tested   Solid     Solid: Not tested      Sonia Baller, MA, CCC-SLP Speech Therapy

## 2020-10-02 ENCOUNTER — Encounter (HOSPITAL_COMMUNITY): Payer: Self-pay | Admitting: Internal Medicine

## 2020-10-02 ENCOUNTER — Observation Stay (HOSPITAL_COMMUNITY): Payer: Medicare HMO | Admitting: Anesthesiology

## 2020-10-02 ENCOUNTER — Encounter (HOSPITAL_COMMUNITY)
Admission: EM | Disposition: A | Discharge status: Home / Self Care | Payer: Self-pay | Source: Home / Self Care | Attending: Emergency Medicine

## 2020-10-02 DIAGNOSIS — E876 Hypokalemia: Secondary | ICD-10-CM | POA: Diagnosis not present

## 2020-10-02 DIAGNOSIS — K2950 Unspecified chronic gastritis without bleeding: Secondary | ICD-10-CM | POA: Diagnosis not present

## 2020-10-02 DIAGNOSIS — K5730 Diverticulosis of large intestine without perforation or abscess without bleeding: Secondary | ICD-10-CM | POA: Diagnosis not present

## 2020-10-02 DIAGNOSIS — Z20822 Contact with and (suspected) exposure to covid-19: Secondary | ICD-10-CM | POA: Diagnosis not present

## 2020-10-02 HISTORY — PX: BIOPSY: SHX5522

## 2020-10-02 HISTORY — PX: COLONOSCOPY WITH PROPOFOL: SHX5780

## 2020-10-02 HISTORY — PX: ESOPHAGOGASTRODUODENOSCOPY (EGD) WITH PROPOFOL: SHX5813

## 2020-10-02 HISTORY — PX: GIVENS CAPSULE STUDY: SHX5432

## 2020-10-02 LAB — CBC
HCT: 23.7 % — ABNORMAL LOW (ref 36.0–46.0)
Hemoglobin: 7.4 g/dL — ABNORMAL LOW (ref 12.0–15.0)
MCH: 28.8 pg (ref 26.0–34.0)
MCHC: 31.2 g/dL (ref 30.0–36.0)
MCV: 92.2 fL (ref 80.0–100.0)
Platelets: 308 10*3/uL (ref 150–400)
RBC: 2.57 MIL/uL — ABNORMAL LOW (ref 3.87–5.11)
RDW: 16.5 % — ABNORMAL HIGH (ref 11.5–15.5)
WBC: 7.2 10*3/uL (ref 4.0–10.5)
nRBC: 0.3 % — ABNORMAL HIGH (ref 0.0–0.2)

## 2020-10-02 LAB — BASIC METABOLIC PANEL
Anion gap: 7 (ref 5–15)
BUN: 5 mg/dL — ABNORMAL LOW (ref 8–23)
CO2: 20 mmol/L — ABNORMAL LOW (ref 22–32)
Calcium: 8.7 mg/dL — ABNORMAL LOW (ref 8.9–10.3)
Chloride: 114 mmol/L — ABNORMAL HIGH (ref 98–111)
Creatinine, Ser: 0.71 mg/dL (ref 0.44–1.00)
GFR, Estimated: >60 mL/min (ref >60)
Glucose, Bld: 92 mg/dL (ref 70–99)
Potassium: 3.6 mmol/L (ref 3.5–5.1)
Sodium: 141 mmol/L (ref 135–145)

## 2020-10-02 LAB — PROTIME-INR
INR: 1.7 — ABNORMAL HIGH (ref 0.8–1.2)
Prothrombin Time: 20 seconds — ABNORMAL HIGH (ref 11.4–15.2)

## 2020-10-02 SURGERY — ESOPHAGOGASTRODUODENOSCOPY (EGD) WITH PROPOFOL
Anesthesia: Monitor Anesthesia Care

## 2020-10-02 MED ORDER — ACETAMINOPHEN-CODEINE #3 300-30 MG PO TABS
1.0000 | ORAL_TABLET | Freq: Four times a day (QID) | ORAL | Status: DC | PRN
  Administered 2020-10-02 – 2020-10-04 (×5): 1 via ORAL
  Filled 2020-10-02 (×5): qty 1

## 2020-10-02 MED ORDER — PROPOFOL 500 MG/50ML IV EMUL
INTRAVENOUS | Status: DC | PRN
  Administered 2020-10-02: 125 ug/kg/min via INTRAVENOUS

## 2020-10-02 MED ORDER — DIPHENHYDRAMINE HCL 25 MG PO CAPS
25.0000 mg | ORAL_CAPSULE | Freq: Four times a day (QID) | ORAL | Status: DC | PRN
  Administered 2020-10-02 – 2020-10-03 (×2): 25 mg via ORAL
  Filled 2020-10-02 (×2): qty 1

## 2020-10-02 MED ORDER — MUSCLE RUB 10-15 % EX CREA
TOPICAL_CREAM | CUTANEOUS | Status: DC | PRN
  Filled 2020-10-02: qty 85

## 2020-10-02 MED ORDER — VICKS VAPORUB 4.73-1.2-2.6 % EX OINT: TOPICAL_OINTMENT | Freq: Every evening | CUTANEOUS | Status: DC | PRN

## 2020-10-02 MED ORDER — PROPOFOL 10 MG/ML IV BOLUS
INTRAVENOUS | Status: DC | PRN
  Administered 2020-10-02: 20 mg via INTRAVENOUS

## 2020-10-02 MED ORDER — CODEINE SULFATE 15 MG PO TABS
30.0000 mg | ORAL_TABLET | Freq: Four times a day (QID) | ORAL | Status: DC | PRN
  Administered 2020-10-02 – 2020-10-03 (×2): 30 mg via ORAL
  Filled 2020-10-02 (×2): qty 2

## 2020-10-02 MED ORDER — LACTATED RINGERS IV SOLN
INTRAVENOUS | Status: AC | PRN
  Administered 2020-10-02: 1000 mL via INTRAVENOUS

## 2020-10-02 SURGICAL SUPPLY — 25 items

## 2020-10-02 NOTE — Anesthesia Preprocedure Evaluation (Addendum)
Anesthesia Evaluation  Patient identified by MRN, date of birth, ID band Patient awake    Reviewed: Allergy & Precautions, NPO status , Patient's Chart, lab work & pertinent test results  Airway Mallampati: I  TM Distance: >3 FB Neck ROM: Full    Dental  (+) Edentulous Upper, Edentulous Lower   Pulmonary    breath sounds clear to auscultation       Cardiovascular negative cardio ROS   Rhythm:Regular Rate:Normal     Neuro/Psych  Headaches, PSYCHIATRIC DISORDERS Anxiety Depression  Neuromuscular disease CVA    GI/Hepatic Neg liver ROS, GERD  ,  Endo/Other  negative endocrine ROS  Renal/GU negative Renal ROS     Musculoskeletal  (+) Arthritis ,   Abdominal Normal abdominal exam  (+)   Peds  Hematology negative hematology ROS (+)   Anesthesia Other Findings   Reproductive/Obstetrics                            Anesthesia Physical Anesthesia Plan  ASA: 3  Anesthesia Plan: General   Post-op Pain Management:    Induction: Intravenous  PONV Risk Score and Plan: 0 and Propofol infusion  Airway Management Planned: Natural Airway, Simple Face Mask and Nasal Cannula  Additional Equipment: None  Intra-op Plan:   Post-operative Plan:   Informed Consent: I have reviewed the patients History and Physical, chart, labs and discussed the procedure including the risks, benefits and alternatives for the proposed anesthesia with the patient or authorized representative who has indicated his/her understanding and acceptance.       Plan Discussed with: CRNA  Anesthesia Plan Comments: (Lab Results      Component                Value               Date                      WBC                      7.2                 10/02/2020                HGB                      7.4 (L)             10/02/2020                HCT                      23.7 (L)            10/02/2020                MCV                       92.2                10/02/2020                PLT                      308                 10/02/2020           )  Anesthesia Quick Evaluation  

## 2020-10-02 NOTE — Progress Notes (Signed)
SLP Cancellation Note  Patient Details Name: KRYSTINA ANUSZEWSKI MRN: [id number] DOB: 17-May-1955   Cancelled treatment:       Reason Eval/Treat Not Completed: Other (comment) (Pt is currently NPO for EGD. SLP will follow up on subsequent date.)  [place] I. Hardin Negus, [place], [place] Office number [phone] Pager [phone]  Horton Marshall 10/02/2020, 12:25 PM

## 2020-10-02 NOTE — Op Note (Signed)
[facility] Patient Name: Emily Peck Procedure Date : 10/02/2020 MRN: [id number] Attending MD: Juanita Craver , MD Date of Birth: 10-09-1955 CSN: [id number] Age: 65 Admit Type: Inpatient Procedure:                EGD with antral biopsies. Indications:              Iron deficiency anemia, Abnormal weight loss,                            Dysphagia, Gastro-esophageal reflux disease-history                            of NSAID use. Providers:                Juanita Craver, MD, Elmer Ramp. Tilden Dome, RN, [employer],                            Rejeana Brock, CRNA Referring MD:             Jamal Collin. Hensel, MD Medicines:                Monitored Anesthesia Care Complications:            No immediate complications. Estimated Blood Loss:     Estimated blood loss was minimal. Procedure:                Pre-Anesthesia Assessment: - Prior to the                            procedure, a history and physical was performed,                            and patient medications and allergies were                            reviewed. The patient's tolerance of previous                            anesthesia was also reviewed. The risks and                            benefits of the procedure and the sedation options                            and risks were discussed with the patient. All                            questions were answered, and informed consent was                            obtained. Prior Anticoagulants: The patient has                            taken Coumadin (Warfarin), last dose was 3 days  prior to procedure. ASA Grade Assessment: IV - A                            patient with severe systemic disease that is a                            constant threat to life. After reviewing the risks                            and benefits, the patient was deemed in                            satisfactory condition to undergo the procedure.                             After obtaining informed consent, the endoscope was                            passed under direct vision. Throughout the                            procedure, the patient's blood pressure, pulse, and                            oxygen saturations were monitored continuously. The                            [id number]-H190 ([id number] Olympus endoscope was introduced                            through the mouth, and advanced to the second part                            of duodenum. The EGD was accomplished without                            difficulty. The patient tolerated the procedure                            well. Scope In: Scope Out: Findings:      The examined esophagus and GEJ appeared widely patent and normal; SCJ       was measured at 35 cm.      A few dispersed erosions with no stigmata of recent bleeding were found       in the gastric antrum along with some patchy gastritis-biopsies done to       rule out H. pylori by pathology.      The cardia and gastric fundus were normal on retroflexion.      The examined duodenum was normal. Impression:               - Normal appearing, widely patent esophagus and GEJ.                           - Erosive gastropathy with  no stigmata of recent                            bleeding-biopsied to rule out H pylori by pathology.                           - Normal examined duodenum. Moderate Sedation:      MAC used. Recommendation:           - Clear liquid diet daily.                           - Continue present medications; Warfarin can be                            restarted once the small bowel capsule study has                            been done.                           - Await pathology results.                           - We will schedule a small bowel capsule study for                            tomorrow.                           - Return to my office in 2 weeks. Procedure Code(s):        --- Professional ---                            [phone], Esophagogastroduodenoscopy, flexible,                            transoral; with biopsy, single or multiple Diagnosis Code(s):        --- Professional ---                           D50.9, Iron deficiency anemia, unspecified                           R63.4, Abnormal weight loss                           R13.10, Dysphagia, unspecified                           K21.9, Gastro-esophageal reflux disease without                            esophagitis CPT copyright 2019 American [employer]. All rights reserved. The codes documented in this report are preliminary and upon coder review may  be revised to meet current compliance requirements. Juanita Craver, MD Juanita Craver, MD 10/02/2020 2:58:16 PM This report has been signed electronically.  Number of Addenda: 0 

## 2020-10-02 NOTE — Transfer of Care (Signed)
Immediate Anesthesia Transfer of Care Note  Patient: Emily Peck  Procedure(s) Performed: ESOPHAGOGASTRODUODENOSCOPY (EGD) WITH PROPOFOL COLONOSCOPY WITH PROPOFOL BIOPSY  Patient Location: Endoscopy Unit  Anesthesia Type:MAC  Level of Consciousness: awake, alert  and oriented  Airway & Oxygen Therapy: Patient Spontanous Breathing and Patient connected to nasal cannula oxygen  Post-op Assessment: Report given to RN and Post -op Vital signs reviewed and stable  Post vital signs: Reviewed and stable  Last Vitals:  Vitals Value Taken Time  BP 147/83 10/02/20 1450  Temp 36.6 C 10/02/20 1441  Pulse 90 10/02/20 1452  Resp 21 10/02/20 1452  SpO2 98 % 10/02/20 1452  Vitals shown include unvalidated device data.  Last Pain:  Vitals:   10/02/20 1441  TempSrc: Oral  PainSc: 0-No pain         Complications: No notable events documented.

## 2020-10-02 NOTE — H&P (View-Only) (Signed)
SLP Cancellation Note  Patient Details Name: Emily Peck MRN: [id number] DOB: [birth year]   Cancelled treatment:       Reason Eval/Treat Not Completed: Other (comment) (Pt is currently NPO for EGD. SLP will follow up on subsequent date.)  Inell Mimbs I. Hardin Negus, [place], [place] Office number [phone] Pager [phone]  Horton Marshall 10/02/2020, 12:25 PM

## 2020-10-02 NOTE — Evaluation (Signed)
Physical Therapy Evaluation Patient Details Name: Emily Peck MRN: [id number] DOB: 1955-07-13 Today's Date: 10/02/2020   History of Present Illness  Pt is a 65 y/o female admitted 7/30 secondary to symptomatic anemia. Likely for EGD on 8/1. PMH includes axiety, CVA, R TKA, and visually impaired.  Clinical Impression  Pt admitted secondary to problem above with deficits below. Pt requiring min guard for mobility tasks using cane. Pt reporting increased fatigue which limited mobility tolerance. Educated about using rollator at home for increased safety. Recommending HHPT at d/c to address current deficits. Will continue to follow acutely.     Follow Up Recommendations Home health PT    Equipment Recommendations  None recommended by PT    Recommendations for Other Services       Precautions / Restrictions Precautions Precautions: Fall Precaution Comments: Visually impaired at baseline. Restrictions Weight Bearing Restrictions: No      Mobility  Bed Mobility               General bed mobility comments: Sitting in chair upon entry    Transfers Overall transfer level: Needs assistance Equipment used: Straight cane Transfers: Sit to/from Stand Sit to Stand: Min guard         General transfer comment: Min guard for safety. Required momentum to stand.  Ambulation/Gait Ambulation/Gait assistance: Min guard Gait Distance (Feet): 50 Feet Assistive device: Straight cane Gait Pattern/deviations: Step-through pattern;Decreased stride length Gait velocity: Decreased   General Gait Details: Slow, very guarded gait. Distance limited to fatigue. Required directional cues secondary to visual deficits. Discussed using rollator at home for increased safety.  Stairs            Wheelchair Mobility    Modified Rankin (Stroke Patients Only)       Balance Overall balance assessment: Mild deficits observed, not formally tested                                            Pertinent Vitals/Pain Pain Assessment: Faces Faces Pain Scale: Hurts a little bit Pain Location: back Pain Descriptors / Indicators: Aching;Discomfort Pain Intervention(s): Limited activity within patient's tolerance;Monitored during session;Repositioned    Home Living Family/patient expects to be discharged to:: Private residence Living Arrangements: Children Available Help at Discharge: Family;Available PRN/intermittently Type of Home: House Home Access: Ramped entrance     Home Layout: One level Home Equipment: Shower seat;Bedside commode;Cane - single point;Walker - 4 wheels      Prior Function Level of Independence: Needs assistance   Gait / Transfers Assistance Needed: Needs supervision outside of home secondary to visual impairments. Uses cane for mobility.  ADL's / Homemaking Assistance Needed: Scat transport, son cooks. Independent with ADLs.        Hand Dominance        Extremity/Trunk Assessment   Upper Extremity Assessment Upper Extremity Assessment: Overall WFL for tasks assessed    Lower Extremity Assessment Lower Extremity Assessment: Generalized weakness    Cervical / Trunk Assessment Cervical / Trunk Assessment: Normal  Communication   Communication: No difficulties  Cognition Arousal/Alertness: Awake/alert Behavior During Therapy: WFL for tasks assessed/performed Overall Cognitive Status: Within Functional Limits for tasks assessed  General Comments      Exercises     Assessment/Plan    PT Assessment Patient needs continued PT services  PT Problem List Decreased strength;Decreased balance;Decreased activity tolerance;Decreased mobility;Decreased knowledge of use of DME       PT Treatment Interventions DME instruction;Gait training;Therapeutic activities;Functional mobility training;Therapeutic exercise;Balance training;Patient/family education    PT Goals  (Current goals can be found in the Care Plan section)  Acute Rehab PT Goals Patient Stated Goal: to go home PT Goal Formulation: With patient Time For Goal Achievement: 10/16/20 Potential to Achieve Goals: Good    Frequency Min 3X/week   Barriers to discharge        Co-evaluation               AM-PAC PT "6 Clicks" Mobility  Outcome Measure Help needed turning from your back to your side while in a flat bed without using bedrails?: A Little Help needed moving from lying on your back to sitting on the side of a flat bed without using bedrails?: A Little Help needed moving to and from a bed to a chair (including a wheelchair)?: A Little Help needed standing up from a chair using your arms (e.g., wheelchair or bedside chair)?: A Little Help needed to walk in hospital room?: A Little Help needed climbing 3-5 steps with a railing? : A Little 6 Click Score: 18    End of Session Equipment Utilized During Treatment: Gait belt Activity Tolerance: Patient limited by fatigue Patient left: in chair;with call bell/phone within reach Nurse Communication: Mobility status PT Visit Diagnosis: Unsteadiness on feet (R26.81);Muscle weakness (generalized) (M62.81)    Time: [id number] PT Time Calculation (min) (ACUTE ONLY): 14 min   Charges:   PT Evaluation $PT Eval Low Complexity: 1 Low          Lou Miner, DPT  Acute Rehabilitation Services  Pager: [phone] Office: [phone]   Rudean Hitt 10/02/2020, 11:41 AM

## 2020-10-02 NOTE — Op Note (Signed)
[facility] Patient Name: Emily Peck Procedure Date : 10/02/2020 MRN: [id number] Attending MD: Juanita Craver , MD Date of Birth: 1955-08-27 CSN: [id number] Age: 65 Admit Type: Inpatient Procedure:                Diagnostic colonoscopy. Indications:              Iron deficiency anemia, Abnormal weight loss,                            Colorectal cancer screening. Providers:                Juanita Craver, MD, Elmer Ramp. Tilden Dome, RN, [employer],                            Rejeana Brock, CRNA Referring MD:             Jamal Collin. Hensel, MD Medicines:                Monitored Anesthesia Care Complications:            No immediate complications. Estimated Blood Loss:     Estimated blood loss: none. Procedure:                Pre-Anesthesia Assessment: - Prior to the                            procedure, a history and physical was performed,                            and patient medications and allergies were                            reviewed. The patient's tolerance of previous                            anesthesia was also reviewed. The risks and                            benefits of the procedure and the sedation options                            and risks were discussed with the patient. All                            questions were answered, and informed consent was                            obtained. Prior Anticoagulants: The patient has                            taken Coumadin (warfarin), last dose was 3 days                            prior to procedure. ASA Grade Assessment: IV - A  patient with severe systemic disease that is a                            constant threat to life. After reviewing the risks                            and benefits, the patient was deemed in                            satisfactory condition to undergo the procedure.                            After obtaining informed consent, the colonoscope                             was passed under direct vision. Throughout the                            procedure, the patient's blood pressure, pulse, and                            oxygen saturations were monitored continuously. The                            CF-HQ190L ([id number]) Olympus colonoscope was                            introduced through the anus and advanced to the the                            terminal ileum, with identification of the                            appendiceal orifice and IC valve. The colonoscopy                            was performed with difficulty as the patient                            desaturated for a few minutes but then was                            resuscitated by a face mask and suctioning of oral                            secretions. The patient tolerated the procedure                            well. The quality of the bowel preparation was                            good. The terminal ileum, the ileocecal valve, the  appendiceal orifice and the rectum were                            photographed. The bowel preparation used was                            GoLYTELY via split dose instruction. Scope In: 2:20:34 PM Scope Out: 2:33:06 PM Scope Withdrawal Time: 0 hours 3 minutes 34 seconds  Total Procedure Duration: 0 hours 12 minutes 32 seconds  Findings:      A couple of small-mouthed diverticula were found in the sigmoid colon.      The terminal ileum appeared normal.      No additional abnormalities were found on retroflexion. Impression:               - A couple of diverticula in the sigmoid colon; the                            rest of the colon appeared normal.                           - The examined portion of the ileum was normal.                           - No specimens collected. Moderate Sedation:      MAC used. Recommendation:           - Clear liquid diet today.                           - Continue present medications; restart  Warfarin                            after the small bowel capsule study.                           - Repeat stool Cologaurd DNA test in 3 years for                            surveillance.                           - Return to GI office in 2 weeks. Procedure Code(s):        --- Professional ---                           [phone], Colonoscopy, flexible; diagnostic, including                            collection of specimen(s) by brushing or washing,                            when performed (separate procedure) Diagnosis Code(s):        --- Professional ---                           D50.9, Iron deficiency anemia,  unspecified                           R63.4, Abnormal weight loss                           Z12.11, Encounter for screening for malignant                            neoplasm of colon CPT copyright 2019 American Medical Association. All rights reserved. The codes documented in this report are preliminary and upon coder review may  be revised to meet current compliance requirements. Juanita Craver, MD Juanita Craver, MD 10/02/2020 3:11:21 PM This report has been signed electronically. Number of Addenda: 0

## 2020-10-02 NOTE — Interval H&P Note (Signed)
History and Physical Interval Note:  10/02/2020 1:54 PM  [place]  has presented today for surgery, with the diagnosis of severe iron deficiency anemia.  The various methods of treatment have been discussed with the patient and family. After consideration of risks, benefits and other options for treatment, the patient has consented to  Procedure(s): ESOPHAGOGASTRODUODENOSCOPY (EGD) WITH PROPOFOL (N/A) COLONOSCOPY WITH PROPOFOL (N/A) as a surgical intervention.  The patient's history has been reviewed, patient examined, no change in status, stable for surgery.  I have reviewed the patient's chart and labs.  Questions were answered to the patient's satisfaction.     Juanita Craver

## 2020-10-02 NOTE — Anesthesia Procedure Notes (Signed)
Procedure Name: MAC Date/Time: 10/02/2020 2:05 PM Performed by: Inda Coke, CRNA Pre-anesthesia Checklist: Patient identified, Emergency Drugs available, Suction available, Timeout performed and Patient being monitored Patient Re-evaluated:Patient Re-evaluated prior to induction Oxygen Delivery Method: Nasal cannula Induction Type: IV induction Dental Injury: Teeth and Oropharynx as per pre-operative assessment

## 2020-10-02 NOTE — Anesthesia Postprocedure Evaluation (Signed)
Anesthesia Post Note  Patient: Emily Peck  Procedure(s) Performed: ESOPHAGOGASTRODUODENOSCOPY (EGD) WITH PROPOFOL COLONOSCOPY WITH PROPOFOL BIOPSY GIVENS CAPSULE STUDY     Patient location during evaluation: PACU Anesthesia Type: MAC Level of consciousness: awake and alert Pain management: pain level controlled Vital Signs Assessment: post-procedure vital signs reviewed and stable Respiratory status: spontaneous breathing, nonlabored ventilation, respiratory function stable and patient connected to nasal cannula oxygen Cardiovascular status: stable and blood pressure returned to baseline Postop Assessment: no apparent nausea or vomiting Anesthetic complications: no   No notable events documented.  Last Vitals:  Vitals:   10/02/20 1511 10/02/20 1521  BP: (!) 166/85 (!) 142/85  Pulse: 80 77  Resp: 17 16  Temp:    SpO2: 100% 100%    Last Pain:  Vitals:   10/02/20 1521  TempSrc:   PainSc: 0-No pain                 Effie Berkshire

## 2020-10-02 NOTE — Progress Notes (Signed)
Patient's BM appears clear yellow watery. No stool noted. RN unable to sign consent because pt and son have a few questions rhat RN unable to answered. PRe-op check list completed.

## 2020-10-02 NOTE — Progress Notes (Addendum)
HD#2 SUBJECTIVE:  Patient Summary: Emily Peck is a 65 y.o. 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   Overnight Events: no acute events overnight    Interm History: Patient seen and evaluated at bedside. She report that she is feeling well this morning. She endorses some some shortness of breath when walking but stated she walked in the hallway yesterday. She denied CP, abdominal pain or N/V.   OBJECTIVE:  Vital Signs: Vitals:   10/02/20 0042 10/02/20 0527 10/02/20 1214 10/02/20 1312  BP: 128/69 135/85 (!) 145/71 (!) 160/93  Pulse: 80 77 75 72  Resp: '18 18 16 '$ (!) 21  Temp: 98.4 F (36.9 C) 98.5 F (36.9 C) 98.2 F (36.8 C) 98.1 F (36.7 C)  TempSrc: Oral Oral Oral Temporal  SpO2: 100% 98% 100% 100%  Weight:    97.8 kg  Height:    '5\' 6"'$  (1.676 m)   Supplemental O2: Room Air SpO2: 100 %  Filed Weights   10/02/20 1312  Weight: 97.8 kg     Intake/Output Summary (Last 24 hours) at 10/02/2020 1436 Last data filed at 10/02/2020 1415 Gross per 24 hour  Intake 0 ml  Output 500 ml  Net -500 ml   Net IO Since Admission: 11.67 mL [10/02/20 1436]  Physical Exam: Physical Exam Constitutional:      Appearance: She is obese.  HENT:     Head: Normocephalic and atraumatic.  Eyes:     Comments: Enucleation right Conjunctival pallor, improving from prior  Cardiovascular:     Rate and Rhythm: Normal rate and regular rhythm.  Pulmonary:     Effort: Pulmonary effort is normal.     Breath sounds: Normal breath sounds.  Abdominal:     General: Bowel sounds are normal.     Palpations: Abdomen is soft.     Tenderness: There is abdominal tenderness (lower quadrant and LLQ pain).  Musculoskeletal:        General: Normal range of motion.     Right lower leg: Tenderness present. No edema.      Left lower leg: Tenderness present. No edema.  Skin:    General: Skin is warm.     Capillary Refill: Capillary refill takes less than 2 seconds.  Neurological:     General: No focal deficit present.     Mental Status: She is alert and oriented to person, place, and time.  Psychiatric:        Mood and Affect: Mood normal.        Behavior: Behavior normal.    Patient Lines/Drains/Airways Status     Active Line/Drains/Airways     Name Placement date Placement time Site Days   Peripheral IV 09/30/20 20 G 1.16" Right;Anterior;Distal Forearm 09/30/20  1303  Forearm  1            Pertinent Labs: CBC Latest Ref Rng & Units 10/02/2020 10/01/2020 10/01/2020  WBC 4.0 - 10.5 K/uL 7.2 7.0 8.3  Hemoglobin 12.0 - 15.0 g/dL 7.4(L) 7.4(L) 7.1(L)  Hematocrit 36.0 - 46.0 % 23.7(L) 25.0(L) 23.3(L)  Platelets 150 - 400 K/uL 308 312 276    CMP Latest Ref Rng & Units 10/02/2020 10/01/2020 09/30/2020  Glucose 70 - 99 mg/dL 92 83 107(H)  BUN 8 - 23 mg/dL 5(L) 8 12  Creatinine 0.44 - 1.00 mg/dL 0.71 0.62  0.67  Sodium 135 - 145 mmol/L 141 139 138  Potassium 3.5 - 5.1 mmol/L 3.6 3.8 3.3(L)  Chloride 98 - 111 mmol/L 114(H) 115(H) 111  CO2 22 - 32 mmol/L 20(L) 20(L) 22  Calcium 8.9 - 10.3 mg/dL 8.7(L) 8.0(L) 8.7(L)  Total Protein 6.5 - 8.1 g/dL - - 6.6  Total Bilirubin 0.3 - 1.2 mg/dL - - 0.4  Alkaline Phos 38 - 126 U/L - - 71  AST 15 - 41 U/L - - 18  ALT 0 - 44 U/L - - 14    No results for input(s): GLUCAP in the last 72 hours.   Pertinent Imaging: No results found.  ASSESSMENT/PLAN:  Assessment: Principal Problem:   Symptomatic anemia   Emily Peck is a 65 y.o. 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  Baseline Hgb is 11. Ddx: GI blood loss vs epistaxis vs IDA vs Anemia of chronic disease. FOBT negative. Patient has not looked to see if any change in stool color. Most recent colonoscopy 16 years ago. Most recent EGD 20 years ago GI bleed still can't be ruled out. Reports very bad acid reflux, taking PPI. On warfarin and has history of Ibuprofen '800mg'$  BID for past 2-3 years making esophagitis likely. Other potential causes include frequent nosebleeds for many years, though she reports that she is able to stop the bleeding quickly each time. No reported large episode of epistaxis that would explain sudden drop in hgb making epistaxis less likely.   Plan: - 1 unit PRBC with improvement to 7.7. hgb down to 7.1 with improvement to 7.4 on repeat cbc - monitor with cbc - GI planning EGD this afternoon 10/02/20 - BID protonix  Dysphagia possibly severe esophagitis, r/o peptic stricture  Plan: - GI to evaluate during EGD - dysphagia diet/ clear liquids - dysphagia to solids, able to tolerate liquids - f/u SLP eval   Hx of stroke on Warfarin Dural venous sinus started on warfarin 2004.  Plan: -Restart anticoagulation after procedure  Hypokalemia Patient had K of 3.3 on admission. It was repleted and improved. Current K is 3.6.  Plan: - Continue to monitor   GERD Patient has GERD that is managed by Protonix 40 mg BID. Patient endorsed compliance but still stated she had heartburn  Plan: -Protonix 40 mg BID -Recs from GI after endoscopy to see if we need to increase   Post-laminectomy pain Patient has chronic back pain.   Plan: - Tylenol #4 for moderate pain, IV morphine for severe pain if not controlled with tylenol   Best Practice: Diet: Clear liquid diet and dysphagia IVF: Fluids: none, Rate: None VTE: SCDs Start: 09/30/20 1432 Code: Full AB: none   Signature: Idamae Schuller, MD Internal Medicine Resident, PGY-1 [facility]  2:36 PM, 10/02/2020   Please contact the on call pager after 5 pm and on weekends at [phone].

## 2020-10-02 NOTE — TOC Initial Note (Addendum)
Transition of Care [facility]) - Initial/Assessment Note    Patient Details  Name: Emily Peck MRN: [id number] Date of Birth: 02/08/1956  [facility]) CM/SW Contact:    Marilu Favre, RN Phone Number: 10/02/2020, 12:35 PM  Clinical Narrative:                 Confirmed address and phone number with patient. Patient has a cane and walker at home already. Her son stays with her.   Patient in agreement for HHPT . Choice offered patient would like [facility]. Messaged Cory with [facility] awaiting call back . Will need orders and face to face .   Tommi Rumps with [facility] accepted  PCP Elise Benne   Expected Discharge Plan: [place]     Patient Goals and CMS Choice Patient states their goals for this hospitalization and ongoing recovery are:: to return to home CMS Medicare.gov Compare Post Acute Care list provided to:: Patient Choice offered to / list presented to : Patient  Expected Discharge Plan and Services Expected Discharge Plan: [place] Choice: [place] arrangements for the past 2 months: Single Family Home                 DME Arranged: N/A         HH Arranged: PT HH Agency: [facility] Date [place]: 10/02/20 Time Opp: [id number] Representative spoke with at [place]: Cory left message  Prior Living Arrangements/Services Living arrangements for the past 2 months: [place] Lives with:: Adult Children Patient language and need for interpreter reviewed:: Yes Do you feel safe going back to the place where you live?: Yes      Need for Family Participation in Patient Care: Yes (Comment) Care giver support system in place?: Yes (comment)   Criminal Activity/Legal Involvement Pertinent to Current Situation/Hospitalization: No - Comment as needed  Activities of Daily Living Home Assistive Devices/Equipment: Environmental consultant (specify type), Cane (specify quad or  straight), Shower chair without back ADL Screening (condition at time of admission) Patient's cognitive ability adequate to safely complete daily activities?: Yes Is the patient deaf or have difficulty hearing?: No Does the patient have difficulty seeing, even when wearing glasses/contacts?: Yes Does the patient have difficulty concentrating, remembering, or making decisions?: No Patient able to express need for assistance with ADLs?: No Does the patient have difficulty dressing or bathing?: No Independently performs ADLs?: Yes (appropriate for developmental age) Does the patient have difficulty walking or climbing stairs?: Yes Weakness of Legs: Both Weakness of Arms/Hands: None  Permission Sought/Granted   Permission granted to share information with : No              Emotional Assessment Appearance:: Appears stated age Attitude/Demeanor/Rapport: Engaged Affect (typically observed): Accepting Orientation: : Oriented to Self, Oriented to Place, Oriented to  Time, Oriented to Situation   Psych Involvement: No (comment)  Admission diagnosis:  Symptomatic anemia [D64.9] Patient Active Problem List   Diagnosis Date Noted   Symptomatic anemia 09/30/2020   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   Category 4 blindness of left eye 04/19/2019   Nocturia more than twice per night 04/19/2019   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   Nasal septal deviation 04/19/2019   Excessive daytime sleepiness 04/19/2019   Neuropathy 04/19/2019   Sleep-related headache 04/19/2019   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/19/2019   Rotator cuff syndrome of both  shoulders 06/09/2018   Cervicalgia 03/19/2018   Disorder of rotator cuff syndrome of right shoulder and allied disorder 01/09/2018   Primary osteoarthritis of right knee 01/09/2016   Peripheral polyneuropathy 10/19/2015   Knee pain, chronic 06/11/2013   Thoracic postlaminectomy syndrome 06/11/2013   Pes planus of both  feet 06/11/2013   Trochanteric bursitis of both hips 11/06/2012   Lumbosacral spondylosis without myelopathy 05/09/2011   DEPRESSION 07/24/2006   CARPAL TUNNEL SYNDROME, RIGHT 07/24/2006   MYOPIA, 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   DEGENERATIVE JOINT DISEASE 07/24/2006   DISORDER, LUMBAR DISC W/MYELOPATHY 07/24/2006   Cerebral venous sinus thrombosis 07/24/2006   AVULSION, EYE 12/23/2005   PCP:  Pcp, No Pharmacy:   [facility] [phone] - [address], [place] [address] Phone: [phone] Fax: [phone]  [facility] [phone] - [place], [place] [address] AT [place] [address] [place] [address] Phone: [phone] Fax: [phone]     Social Determinants of Health (No Name) Interventions    Readmission Risk Interventions No flowsheet data found.

## 2020-10-03 ENCOUNTER — Encounter (HOSPITAL_COMMUNITY): Payer: Self-pay | Admitting: Gastroenterology

## 2020-10-03 ENCOUNTER — Telehealth: Payer: Self-pay | Admitting: *Deleted

## 2020-10-03 DIAGNOSIS — K2950 Unspecified chronic gastritis without bleeding: Secondary | ICD-10-CM | POA: Diagnosis not present

## 2020-10-03 LAB — CBC WITH DIFFERENTIAL/PLATELET
Abs Immature Granulocytes: 0.05 10*3/uL (ref 0.00–0.07)
Basophils Absolute: 0.1 10*3/uL (ref 0.0–0.1)
Basophils Relative: 0 %
Eosinophils Absolute: 0.1 10*3/uL (ref 0.0–0.5)
Eosinophils Relative: 1 %
HCT: 24.2 % — ABNORMAL LOW (ref 36.0–46.0)
Hemoglobin: 7.4 g/dL — ABNORMAL LOW (ref 12.0–15.0)
Immature Granulocytes: 0 %
Lymphocytes Relative: 18 %
Lymphs Abs: 2.2 10*3/uL (ref 0.7–4.0)
MCH: 28.2 pg (ref 26.0–34.0)
MCHC: 30.6 g/dL (ref 30.0–36.0)
MCV: 92.4 fL (ref 80.0–100.0)
Monocytes Absolute: 1.2 10*3/uL — ABNORMAL HIGH (ref 0.1–1.0)
Monocytes Relative: 10 %
Neutro Abs: 8.5 10*3/uL — ABNORMAL HIGH (ref 1.7–7.7)
Neutrophils Relative %: 71 %
Platelets: 349 10*3/uL (ref 150–400)
RBC: 2.62 MIL/uL — ABNORMAL LOW (ref 3.87–5.11)
RDW: 16.5 % — ABNORMAL HIGH (ref 11.5–15.5)
WBC: 12 10*3/uL — ABNORMAL HIGH (ref 4.0–10.5)
nRBC: 0 % (ref 0.0–0.2)

## 2020-10-03 LAB — BASIC METABOLIC PANEL
Anion gap: 6 (ref 5–15)
BUN: 5 mg/dL — ABNORMAL LOW (ref 8–23)
CO2: 23 mmol/L (ref 22–32)
Calcium: 8.6 mg/dL — ABNORMAL LOW (ref 8.9–10.3)
Chloride: 109 mmol/L (ref 98–111)
Creatinine, Ser: 0.69 mg/dL (ref 0.44–1.00)
GFR, Estimated: >60 mL/min (ref >60)
Glucose, Bld: 88 mg/dL (ref 70–99)
Potassium: 3.2 mmol/L — ABNORMAL LOW (ref 3.5–5.1)
Sodium: 138 mmol/L (ref 135–145)

## 2020-10-03 LAB — PROTIME-INR
INR: 1.5 — ABNORMAL HIGH (ref 0.8–1.2)
Prothrombin Time: 17.7 seconds — ABNORMAL HIGH (ref 11.4–15.2)

## 2020-10-03 LAB — HEPARIN LEVEL (UNFRACTIONATED): Heparin Unfractionated: 0.38 IU/mL (ref 0.30–0.70)

## 2020-10-03 LAB — SURGICAL PATHOLOGY

## 2020-10-03 MED ORDER — SODIUM CHLORIDE 0.9 % IV SOLN
25.0000 mg | Freq: Once | INTRAVENOUS | Status: AC
  Administered 2020-10-03: 25 mg via INTRAVENOUS
  Filled 2020-10-03: qty 0.5

## 2020-10-03 MED ORDER — WARFARIN SODIUM 6 MG PO TABS
9.0000 mg | ORAL_TABLET | Freq: Once | ORAL | Status: AC
  Administered 2020-10-03: 9 mg via ORAL
  Filled 2020-10-03: qty 1

## 2020-10-03 MED ORDER — SODIUM CHLORIDE 0.9 % IV SOLN: 500.0000 mg | Freq: Once | INTRAVENOUS | Status: DC

## 2020-10-03 MED ORDER — SODIUM CHLORIDE 0.9 % IV SOLN
500.0000 mg | Freq: Once | INTRAVENOUS | Status: AC
  Administered 2020-10-03: 500 mg via INTRAVENOUS
  Filled 2020-10-03 (×2): qty 10

## 2020-10-03 MED ORDER — WARFARIN - PHARMACIST DOSING INPATIENT: Freq: Every day | Status: DC

## 2020-10-03 MED ORDER — GABAPENTIN 400 MG PO CAPS: 400.0000 mg | ORAL_CAPSULE | Freq: Three times a day (TID) | ORAL | Status: DC

## 2020-10-03 MED ORDER — HEPARIN (PORCINE) 25000 UT/250ML-% IV SOLN
1100.0000 [IU]/h | INTRAVENOUS | Status: AC
  Administered 2020-10-03 – 2020-10-04 (×2): 1100 [IU]/h via INTRAVENOUS
  Filled 2020-10-03 (×3): qty 250

## 2020-10-03 MED ORDER — POTASSIUM CHLORIDE CRYS ER 20 MEQ PO TBCR
40.0000 meq | EXTENDED_RELEASE_TABLET | Freq: Two times a day (BID) | ORAL | Status: DC
  Administered 2020-10-03: 40 meq via ORAL
  Filled 2020-10-03: qty 2

## 2020-10-03 MED ORDER — GABAPENTIN 400 MG PO CAPS
800.0000 mg | ORAL_CAPSULE | Freq: Three times a day (TID) | ORAL | Status: DC
  Administered 2020-10-03 – 2020-10-04 (×4): 800 mg via ORAL
  Filled 2020-10-03 (×4): qty 2

## 2020-10-03 MED ORDER — POTASSIUM CHLORIDE CRYS ER 20 MEQ PO TBCR
40.0000 meq | EXTENDED_RELEASE_TABLET | Freq: Two times a day (BID) | ORAL | Status: DC
  Administered 2020-10-04: 40 meq via ORAL
  Filled 2020-10-03 (×2): qty 2

## 2020-10-03 NOTE — Telephone Encounter (Signed)
Return Emily Peck,  Call she reports she is in the hospital. She was instructed to call office, after she is discharged. She verbalizes understanding.

## 2020-10-03 NOTE — Progress Notes (Addendum)
HD#2 SUBJECTIVE:  Patient Summary: Emily Peck is a 65 y.o. 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   Overnight Events: no acute events overnight    Interm History: Patient seen and evaluated at bedside. She report that she is feeling well this morning. She still endorses some some shortness of breath when walking but stated she walked in the hallway yesterday. She denied CP, abdominal pain or N/V. She appeared more energetic today.   OBJECTIVE:  Vital Signs: Vitals:   10/02/20 1550 10/02/20 1746 10/03/20 0054 10/03/20 0544  BP:  (!) 155/81 121/61 128/66  Pulse:  84 87 75  Resp:  '16 17 18  '$ Temp:  98.4 F (36.9 C) 98.8 F (37.1 C) 98.6 F (37 C)  TempSrc:  Oral Oral Oral  SpO2:  100% 97% 99%  Weight: 97.8 kg     Height: '5\' 6"'$  (1.676 m)      Supplemental O2: Room Air SpO2: 99 %  Filed Weights   10/02/20 1312 10/02/20 1550  Weight: 97.8 kg 97.8 kg     Intake/Output Summary (Last 24 hours) at 10/03/2020 1355 Last data filed at 10/02/2020 1438 Gross per 24 hour  Intake 600 ml  Output 0 ml  Net 600 ml    Net IO Since Admission: 611.67 mL [10/03/20 1355]  Physical Exam: Physical Exam Constitutional:      Appearance: She is obese.  HENT:     Head: Normocephalic and atraumatic.  Eyes:     Comments: Enucleation right Conjunctival pallor, improving from prior  Cardiovascular:     Rate and Rhythm: Normal rate and regular rhythm.  Pulmonary:     Effort: Pulmonary effort is normal.     Breath sounds: Normal breath sounds.  Abdominal:     General: Bowel sounds are normal.     Palpations: Abdomen is soft.     Tenderness: There is abdominal tenderness (lower quadrant and LLQ pain).  Musculoskeletal:        General: Normal range of motion.     Right lower leg: Tenderness  present. No edema.     Left lower leg: Tenderness present. No edema.  Skin:    General: Skin is warm.     Capillary Refill: Capillary refill takes less than 2 seconds.  Neurological:     General: No focal deficit present.     Mental Status: She is alert and oriented to person, place, and time.  Psychiatric:        Mood and Affect: Mood normal.        Behavior: Behavior normal.    Patient Lines/Drains/Airways Status     Active Line/Drains/Airways     Name Placement date Placement time Site Days   Peripheral IV 09/30/20 20 G 1.16" Right;Anterior;Distal Forearm 09/30/20  1303  Forearm  1            Pertinent Labs: CBC Latest Ref Rng & Units 10/03/2020 10/02/2020 10/01/2020  WBC 4.0 - 10.5 K/uL 12.0(H) 7.2 7.0  Hemoglobin 12.0 - 15.0 g/dL 7.4(L) 7.4(L) 7.4(L)  Hematocrit 36.0 - 46.0 % 24.2(L) 23.7(L) 25.0(L)  Platelets 150 - 400 K/uL 349 308 312    CMP Latest Ref Rng & Units 10/03/2020 10/02/2020 10/01/2020  Glucose 70 - 99 mg/dL 88 92 83  BUN 8 - 23 mg/dL 5(L) 5(L) 8  Creatinine  0.44 - 1.00 mg/dL 0.69 0.71 0.62  Sodium 135 - 145 mmol/L 138 141 139  Potassium 3.5 - 5.1 mmol/L 3.2(L) 3.6 3.8  Chloride 98 - 111 mmol/L 109 114(H) 115(H)  CO2 22 - 32 mmol/L 23 20(L) 20(L)  Calcium 8.9 - 10.3 mg/dL 8.6(L) 8.7(L) 8.0(L)  Total Protein 6.5 - 8.1 g/dL - - -  Total Bilirubin 0.3 - 1.2 mg/dL - - -  Alkaline Phos 38 - 126 U/L - - -  AST 15 - 41 U/L - - -  ALT 0 - 44 U/L - - -    No results for input(s): GLUCAP in the last 72 hours.   Pertinent Imaging: No results found.  ASSESSMENT/PLAN:  Assessment: Principal Problem:   Symptomatic anemia   Emily Peck is a 65 y.o. 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  Baseline Hgb is 11. Ddx: GI blood loss vs epistaxis vs IDA vs Anemia of chronic disease. FOBT negative. Patient has not looked to see if any change in stool color. Most recent colonoscopy 16 years ago. Most recent EGD 20 years ago GI bleed still can't be ruled out. Reports very bad acid reflux, taking PPI. On warfarin and has history of Ibuprofen '800mg'$  BID for past 2-3 years making esophagitis likely. Other potential causes include frequent nosebleeds for many years, though she reports that she is able to stop the bleeding quickly each time. No known large episode of epistaxis that would explain sudden drop in hgb making epistaxis less likely. But this may be the source if GI is negative for blood loss as patient has multiple episode per week and has not had an episode since admission and hgb has been stable.   Plan: - 1 unit PRBC in ED with improvement to 7.7. Hgb down to 7.1 with improvement to 7.4 on repeat cbc (10/03/20) - monitor with cbc - GI planning EGD this afternoon 10/02/20  -Colonoscopy showing some diverticula but no source of bleeding.  -EGD showing some erosion in the antrum and likely are cause of her bleeding per Dr. Benson Peck interpretation.  - BID protonix -Nose bleed possible source. Follow up ENT outpatient.   Dysphagia possibly severe esophagitis, r/o peptic stricture  Plan: - GI to evaluate during EGD  -no stricture, or esopaghitis - dysphagia diet/ clear liquids - dysphagia to solids, able to tolerate liquids - Esophogram pending    Hx of stroke on Warfarin Dural venous sinus started on warfarin 2004.  Plan: -Restarted anticoagulation with heparin bridge then coumadin.  Hypokalemia Patient had K of 3.3 on admission. It was repleted and improved. Current K is 3.2.  Plan: - Continue to monitor -Replete potassium   GERD Patient has GERD that is managed by Protonix 40 mg BID. Patient endorsed  compliance but still stated she had heartburn  Plan: -Protonix 40 mg BID -Recs from GI after endoscopy to see if we need to increase (10/03/20) -GI follow up in 2 weeks   Post-laminectomy pain Patient has chronic back pain.   Plan: - Tylenol #4 for moderate pain, IV morphine for severe pain if not controlled with tylenol  Peripheral Neuropathy: Patient has baseline neuropathy managed on gabapentin 1200 mg TID.  Plan: -Start  800 mg BID of gabapentin as patient finished her procedures.  Best Practice: Diet: Clear liquid diet and dysphagia IVF: Fluids: none, Rate: None VTE: SCDs Start: 09/30/20 1432 Code: Full AB: none   Signature: Idamae Schuller, MD Internal Medicine Resident, PGY-1 [facility]  1:55 PM, 10/03/2020

## 2020-10-03 NOTE — Progress Notes (Signed)
Doctors notified of K level this morning during rounds.

## 2020-10-03 NOTE — Discharge Summary (Addendum)
Name: Emily Peck MRN: [id number] DOB: [birth year] 65 y.o. PCP: Pcp, No  Date of Admission: 09/30/2020 10:32 AM Date of Discharge: 10/04/20 Attending Physician: Angelica Pou, MD  Discharge Diagnosis: 1. Symptomatic iron deficiency anemia 2. Dysphagia 3. Hypokalemia 4. GERD 5. Hx of stroke on Warfarin 6. 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 for pain What changed:  reasons to take this additional instructions   diphenhydrAMINE 25 MG tablet Commonly known as: BENADRYL Take 25-50 mg by mouth 4 (four) times daily as needed for itching. Takes with Tylenol #4 due to codeine allergy.   enoxaparin 100 MG/ML injection Commonly known as: LOVENOX Inject 1 syringe into the skin every 12 (twelve) hours for 5 days.   escitalopram 20 MG tablet Commonly known as: LEXAPRO Take 20 mg by mouth at bedtime.   furosemide 20 MG tablet Commonly known as: LASIX Take 1 tablet (20 mg total) by mouth daily for 7 days. What changed:  when to take this reasons to take this   gabapentin 600 MG tablet Commonly known as: NEURONTIN Take 2 tablets (1,200 mg total) by mouth 3 (three) times daily.   Lido-Capsaicin-Men-Methyl Sal 0.5-0.035-5-20 % Ptch Place one patch on the bottom of each foot every night at bedtime   methocarbamol 500 MG tablet Commonly known as: ROBAXIN Take 1 tablet (500 mg total) by mouth every 8 (eight) hours as needed for muscle spasms.   pantoprazole 40 MG tablet Commonly known as: PROTONIX Take 1 tablet (40 mg total) by mouth 2 (two) times daily.    potassium chloride 10 MEQ tablet Commonly known as: KLOR-CON Take 10 mEq by mouth daily.   Propylene Glycol 0.6 % Soln Place 1 drop into both eyes as needed (dry eyes).   sodium chloride 0.65 % Soln nasal spray Commonly known as: OCEAN Place 1 spray into both nostrils as needed for congestion.   topiramate 200 MG tablet Commonly known as: TOPAMAX Take 200 mg by mouth at bedtime.   traZODone 150 MG tablet Commonly known as: DESYREL Take 150 mg by mouth at bedtime.   VICKS VAPORUB EX Apply topically. Uses on feet prn for pain  ([employer] brand)   VITAMIN B COMPLEX PO Take 1 tablet by mouth daily.   VITAMIN C PO Take 1 tablet by mouth daily.   VITAMIN D PO Take 1 tablet by mouth daily.   warfarin 6 MG tablet Commonly known as: COUMADIN Take 6-9 mg by mouth daily. Take '9mg'$  Monday, Wednesday, and Fridays, then all other days take '6mg'$ s daily        Disposition and follow-up:   Emily Peck was discharged from [facility] in Stable condition.  At the hospital follow up visit please address:  1.  Anemia: patient presented with symptomatic anemia with gastric erosions as a potential cause. She is to follow up with GI in 2 weeks.  2. Dysphagia: Patient has dysphagia that is stable. GI will continue to follow up  with this patient.  3. Hypokalemia: Patient has low potassium and will benefit from long term repletion.  2.  Labs / imaging needed at time of follow-up:  Labs: CBC, BMP Imaging: None  3.  Pending labs/ test needing follow-up: None  Follow-up Appointments:  Follow-up Information     [facility] Follow up.   Specialty: Home Health Services Contact information: [address] [place] [address] [phone]                 Hospital Course by date: 09/30/20-Patient presented to ED with DOE of 2 weeks, weakness, palpitations, and intermittent lightheadedness. She also complained of dysphagia. She  endorsed taking ibuprofen 800 mg BID for 2-3 years. She also endorsed nose bleeds on a weekly basis. Patient hgb was 6.0 on admission and after 1 unit transfusion it was 7.7 and currently stabilized at 7.4. Patient admitted to inpatient. GI consulted. Speech consulted.  10/01/20-08/02-No acute events. GI performed EGD and colonoscopy with EGD showing gastric erosion and colonoscopy showing diverticula but no significant abnormality.  10/04/20-Esophogram some esophageal dysmotility and possible slight narrowing at the GE junction. Otherwise patient stable and feeling great. Discharge was discussed with the patient and patient agreed to discharge with follow up at our IM center and GI follow up.   Discharge Subjective:  Patient seen at bedside. She is doing well and feeling great. She was excited about discharge. I explained the cause for her anemia and she confirmed understanding. She denied any symptoms but stated if she can't take NSAIDs for her pain what else can she take. Gave several option but advised follow up with pain clinic.  Discharge Exam:   BP 140/73 (BP Location: Left Arm)   Pulse 72   Temp 98.2 F (36.8 C) (Oral)   Resp 16   Ht '5\' 6"'$  (1.676 m)   Wt 97.8 kg   SpO2 98%   BMI 34.80 kg/m  Discharge exam:  Physical Exam Constitutional:      General: She is not in acute distress.    Appearance: Normal appearance. She is well-developed. She is not ill-appearing.  HENT:     Head: Normocephalic and atraumatic.     Right Ear: External ear normal.     Left Ear: External ear normal.     Nose: Nose normal.     Mouth/Throat:     Mouth: Mucous membranes are moist.     Pharynx: Oropharynx is clear.  Eyes:     Conjunctiva/sclera: Conjunctivae normal.  Cardiovascular:     Rate and Rhythm: Normal rate and regular rhythm.     Pulses: Normal pulses.     Heart sounds: Normal heart sounds. No murmur heard.   No friction rub. No gallop.  Pulmonary:     Effort: Pulmonary effort is normal.  No respiratory distress.     Breath sounds: Normal breath sounds. No stridor. No wheezing or rhonchi.  Abdominal:     General: Bowel sounds are normal. There is no distension.     Palpations: Abdomen is soft. There is no mass.  Musculoskeletal:        General: No swelling. Normal range of motion.     Cervical back: Normal range of motion and neck supple.     Right lower leg: Tenderness (neuropathic pain) present.     Left lower leg: Tenderness (neuropathic pain) present.  Skin:    General: Skin is warm.     Capillary Refill: Capillary refill takes less than  2 seconds.     Coloration: Skin is not jaundiced.     Findings: No erythema, lesion or rash.  Neurological:     General: No focal deficit present.     Mental Status: She is alert and oriented to person, place, and time. Mental status is at baseline.  Psychiatric:        Mood and Affect: Mood normal.        Behavior: Behavior normal.     Pertinent Labs, Studies, and Procedures:  CBC Latest Ref Rng & Units 10/04/2020 10/03/2020 10/02/2020  WBC 4.0 - 10.5 K/uL 9.5 12.0(H) 7.2  Hemoglobin 12.0 - 15.0 g/dL 7.2(L) 7.4(L) 7.4(L)  Hematocrit 36.0 - 46.0 % 24.0(L) 24.2(L) 23.7(L)  Platelets 150 - 400 K/uL 358 349 308   CMP Latest Ref Rng & Units 10/04/2020 10/03/2020 10/02/2020  Glucose 70 - 99 mg/dL 88 88 92  BUN 8 - 23 mg/dL 5(L) 5(L) 5(L)  Creatinine 0.44 - 1.00 mg/dL 0.70 0.69 0.71  Sodium 135 - 145 mmol/L 140 138 141  Potassium 3.5 - 5.1 mmol/L 3.4(L) 3.2(L) 3.6  Chloride 98 - 111 mmol/L 111 109 114(H)  CO2 22 - 32 mmol/L 23 23 20(L)  Calcium 8.9 - 10.3 mg/dL 8.5(L) 8.6(L) 8.7(L)  Total Protein 6.5 - 8.1 g/dL - - -  Total Bilirubin 0.3 - 1.2 mg/dL - - -  Alkaline Phos 38 - 126 U/L - - -  AST 15 - 41 U/L - - -  ALT 0 - 44 U/L - - -    Discharge Instructions: Discharge Instructions     Call MD for:  difficulty breathing, headache or visual disturbances   Complete by: As directed    Call MD for:  extreme fatigue   Complete by: As  directed    Call MD for:  hives   Complete by: As directed    Call MD for:  persistant dizziness or light-headedness   Complete by: As directed    Call MD for:  persistant nausea and vomiting   Complete by: As directed    Call MD for:  redness, tenderness, or signs of infection (pain, swelling, redness, odor or green/yellow discharge around incision site)   Complete by: As directed    Call MD for:  severe uncontrolled pain   Complete by: As directed    Call MD for:  temperature >100.4   Complete by: As directed    Diet - low sodium heart healthy   Complete by: As directed    Diet full liquid   Complete by: As directed    Discharge instructions   Complete by: As directed    You were in the hospital for SOB on exertion. You came in with low hemoglobin and were given 1 unit of blood. Your endoscopy showed some irritation in the stomach that may explain the low hemoglobin. We advise you to stop taking ibuprofen as it can lead to that stomach irritation. We also advise you to take protonix 40 mg BID to protect your stomach from acid which can cause irritation. GI will see you outpatient in 2 weeks to check your hemoglobin and see if your bleeding has stopped. They will also talk to you about your trouble swallowing. Until then try avoiding solid that are hard to digest and and eat that are soft and easy to swallow. A list will be provided to you. We will see you in our clinic next week to see how you are doing and refer you  to ENT for your nose bleeds. You will also be getting another IV iron transfusion that we will schedule in the clinic. You will be receiving home health PT upon discharge.   Increase activity slowly   Complete by: As directed        Signed: Idamae Schuller, MD 10/04/2020, 4:30 PM   Pager: [phone]

## 2020-10-03 NOTE — Progress Notes (Signed)
  Speech Language Pathology Treatment: Dysphagia  Patient Details Name: Emily Peck MRN: [id number] DOB: 07-Oct-1955 Today's Date: 10/03/2020 Time: [id number] SLP Time Calculation (min) (ACUTE ONLY): 10 min  Assessment / Plan / Recommendation Clinical Impression  Appreciate EGD report from Gastroenterology. Discussed pt status/POC with RN who reports awaiting of SLP services to advance pt diet as indicated. Pt upright in chair and alert, eager to consume solid POs. She reports significant choking events at home and that she has to eat slow and alternate bites with sips. This date she consumed small bites of puree and regular textured cracker without overt s/sx of aspiration, however she did report transient globus sensation in x1 instance post cracker intake. Thin liquids via straw also unremarkable for s/sx of aspiration. Educated pt in regards to taking small bites/sips at slow rate and alternating solids with liquids in order to decrease risk for aspiration. Recommend initiate dysphagia 2/thin liquid diet with SLP to f/u for tolerance and advance further as indicated.    HPI HPI: Patient is a 65 y.o. 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 Patient reported she has h/o "very bad acid reflux" and last EGD was about 20 years ago. GI has seen patient during current admission and recommendation is for EGD/colonoscopy on 8/1.      SLP Plan  Continue with current plan of care       Recommendations  Diet recommendations: Dysphagia 2 (fine chop);Thin liquid Liquids provided via: Straw;Cup Medication Administration: Whole meds with puree Supervision: Patient able to self feed Compensations: Slow rate;Small sips/bites;Follow solids with liquid Postural Changes and/or Swallow Maneuvers: Seated upright 90 degrees                Oral  Care Recommendations: Oral care BID Follow up Recommendations: Other (comment) (TBD) SLP Visit Diagnosis: Dysphagia, unspecified (R13.10) Plan: Continue with current plan of care       [place], [place], [place] Office Number: [phone]   Acie Fredrickson 10/03/2020, 2:18 PM

## 2020-10-03 NOTE — Progress Notes (Signed)
ANTICOAGULATION CONSULT NOTE   Pharmacy Consult:  Heparin / Coumadin Indication: History of dural venous sinus thrombosis   Allergies  Allergen Reactions   Codeine     Other reaction(s): itching   No Known Allergies     Patient Measurements: Height: '5\' 6"'$  (167.6 cm) Weight: 97.8 kg (215 lb 9.8 oz) IBW/kg (Calculated) : 59.3 Heparin Dosing Weight: 81 kg  Vital Signs: Temp: 97.7 F (36.5 C) (08/02 2324) Temp Source: Oral (08/02 2324) BP: 111/71 (08/02 2324) Pulse Rate: 67 (08/02 2324)  Labs: Recent Labs    10/01/20 0501 10/01/20 0902 10/02/20 0346 10/02/20 0647 10/03/20 0437 10/03/20 1513 10/03/20 2216  HGB 7.1* 7.4* 7.4*  --  7.4*  --   --   HCT 23.3* 25.0* 23.7*  --  24.2*  --   --   PLT 276 312 308  --  349  --   --   LABPROT 25.4*  --   --  20.0*  --  17.7*  --   INR 2.3*  --   --  1.7*  --  1.5*  --   HEPARINUNFRC  --   --   --   --   --   --  0.38  CREATININE 0.62  --  0.71  --  0.69  --   --      Estimated Creatinine Clearance: 81.6 mL/min (by C-G formula based on SCr of 0.69 mg/dL).   Medical History: Past Medical History:  Diagnosis Date   Anxiety    Clotting disorder (HCC)    Depression    Dyspnea    GERD (gastroesophageal reflux disease)    Headache    Lumbosacral spondylosis without myelopathy    Neuropathy    Postlaminectomy syndrome, lumbar region    Postlaminectomy syndrome, thoracic region    Stroke [facility])      Assessment: 26 YOF with history of dural sinus thrombosis on Coumadin PTA.  Patient was sent to the hospital by PCP for symptomatic anemia.  S/p EGD which showed diverticula in the sigmoid colon.  INR came down to 1.7 and Pharmacy consulted to bridge IV heparin to Coumadin.  Hemoglobin stable in the 7s post transfusion; platelet count WNL; no bleeding reported.  8/2 PM update:  Heparin level therapeutic  INR 1.5  Goal of Therapy:  INR 2-3 Heparin level 0.3-0.7 units/ml Monitor platelets by anticoagulation protocol: Yes    Plan:  Cont heparin at 1100 units/hr Confirmatory heparin level with AM labs  Narda Bonds, PharmD, [place] Pharmacist Phone: [phone]

## 2020-10-03 NOTE — Progress Notes (Signed)
ANTICOAGULATION CONSULT NOTE - Initial Consult  Pharmacy Consult:  Heparin / Coumadin Indication: History of dural venous sinus thrombosis   Allergies  Allergen Reactions   Codeine     Other reaction(s): itching   No Known Allergies     Patient Measurements: Height: '5\' 6"'$  (167.6 cm) Weight: 97.8 kg (215 lb 9.8 oz) IBW/kg (Calculated) : 59.3 Heparin Dosing Weight: 81 kg  Vital Signs: Temp: 98.6 F (37 C) (08/02 0544) Temp Source: Oral (08/02 0544) BP: 128/66 (08/02 0544) Pulse Rate: 75 (08/02 0544)  Labs: Recent Labs    09/30/20 2012 10/01/20 0501 10/01/20 0902 10/02/20 0346 10/02/20 0647 10/03/20 0437  HGB 7.7* 7.1* 7.4* 7.4*  --  7.4*  HCT 25.6* 23.3* 25.0* 23.7*  --  24.2*  PLT 297 276 312 308  --  349  APTT 35  --   --   --   --   --   LABPROT  --  25.4*  --   --  20.0*  --   INR  --  2.3*  --   --  1.7*  --   CREATININE  --  0.62  --  0.71  --  0.69    Estimated Creatinine Clearance: 81.6 mL/min (by C-G formula based on SCr of 0.69 mg/dL).   Medical History: Past Medical History:  Diagnosis Date   Anxiety    Clotting disorder (HCC)    Depression    Dyspnea    GERD (gastroesophageal reflux disease)    Headache    Lumbosacral spondylosis without myelopathy    Neuropathy    Postlaminectomy syndrome, lumbar region    Postlaminectomy syndrome, thoracic region    Stroke [facility])      Assessment: 58 YOF with history of dural sinus thrombosis on Coumadin PTA.  Patient was sent to the hospital by PCP for symptomatic anemia.  S/p EGD which showed diverticula in the sigmoid colon.  INR came down to 1.7 and Pharmacy consulted to bridge IV heparin to Coumadin.  Hemoglobin stable in the 7s post transfusion; platelet count WNL; no bleeding reported.  Goal of Therapy:  INR 2-3 Heparin level 0.3-0.7 units/ml Monitor platelets by anticoagulation protocol: Yes   Plan:  Heparin gtt at 1100 units/hr - no bolus with INR at 1.7 Coumadin '9mg'$  PO today Check 6 hr  heparin level Daily heparin level, PT / INR and CBC  Alyssah Algeo D. Mina Marble, PharmD, BCPS, [place] 10/03/2020, 2:09 PM

## 2020-10-03 NOTE — Plan of Care (Signed)
  Problem: Health Behavior/Discharge Planning: Goal: Ability to manage health-related needs will improve Outcome: Progressing   

## 2020-10-03 NOTE — Telephone Encounter (Signed)
Oral swab drug screen was consistent for prescribed medications.  ?

## 2020-10-04 ENCOUNTER — Observation Stay (HOSPITAL_COMMUNITY): Payer: Medicare HMO

## 2020-10-04 ENCOUNTER — Other Ambulatory Visit (HOSPITAL_COMMUNITY): Payer: Self-pay

## 2020-10-04 DIAGNOSIS — G8929 Other chronic pain: Secondary | ICD-10-CM

## 2020-10-04 DIAGNOSIS — R1310 Dysphagia, unspecified: Secondary | ICD-10-CM

## 2020-10-04 DIAGNOSIS — E876 Hypokalemia: Secondary | ICD-10-CM | POA: Diagnosis not present

## 2020-10-04 DIAGNOSIS — K219 Gastro-esophageal reflux disease without esophagitis: Secondary | ICD-10-CM

## 2020-10-04 DIAGNOSIS — D509 Iron deficiency anemia, unspecified: Secondary | ICD-10-CM

## 2020-10-04 DIAGNOSIS — K2950 Unspecified chronic gastritis without bleeding: Secondary | ICD-10-CM | POA: Diagnosis not present

## 2020-10-04 DIAGNOSIS — Z8673 Personal history of transient ischemic attack (TIA), and cerebral infarction without residual deficits: Secondary | ICD-10-CM

## 2020-10-04 LAB — CBC
HCT: 24 % — ABNORMAL LOW (ref 36.0–46.0)
Hemoglobin: 7.2 g/dL — ABNORMAL LOW (ref 12.0–15.0)
MCH: 28.1 pg (ref 26.0–34.0)
MCHC: 30 g/dL (ref 30.0–36.0)
MCV: 93.8 fL (ref 80.0–100.0)
Platelets: 358 10*3/uL (ref 150–400)
RBC: 2.56 MIL/uL — ABNORMAL LOW (ref 3.87–5.11)
RDW: 16.4 % — ABNORMAL HIGH (ref 11.5–15.5)
WBC: 9.5 10*3/uL (ref 4.0–10.5)
nRBC: 0.2 % (ref 0.0–0.2)

## 2020-10-04 LAB — BASIC METABOLIC PANEL
Anion gap: 6 (ref 5–15)
BUN: 5 mg/dL — ABNORMAL LOW (ref 8–23)
CO2: 23 mmol/L (ref 22–32)
Calcium: 8.5 mg/dL — ABNORMAL LOW (ref 8.9–10.3)
Chloride: 111 mmol/L (ref 98–111)
Creatinine, Ser: 0.7 mg/dL (ref 0.44–1.00)
GFR, Estimated: >60 mL/min (ref >60)
Glucose, Bld: 88 mg/dL (ref 70–99)
Potassium: 3.4 mmol/L — ABNORMAL LOW (ref 3.5–5.1)
Sodium: 140 mmol/L (ref 135–145)

## 2020-10-04 LAB — HEPARIN LEVEL (UNFRACTIONATED): Heparin Unfractionated: 0.5 IU/mL (ref 0.30–0.70)

## 2020-10-04 LAB — PROTIME-INR
INR: 1.5 — ABNORMAL HIGH (ref 0.8–1.2)
Prothrombin Time: 18.3 seconds — ABNORMAL HIGH (ref 11.4–15.2)

## 2020-10-04 MED ORDER — ENOXAPARIN SODIUM 100 MG/ML IJ SOSY
100.0000 mg | PREFILLED_SYRINGE | Freq: Two times a day (BID) | INTRAMUSCULAR | 0 refills | Status: DC
  Filled 2020-10-04: qty 10, 8d supply, fill #0

## 2020-10-04 MED ORDER — ENOXAPARIN SODIUM 100 MG/ML IJ SOSY
95.0000 mg | PREFILLED_SYRINGE | Freq: Two times a day (BID) | INTRAMUSCULAR | Status: DC
  Administered 2020-10-04: 95 mg via SUBCUTANEOUS
  Filled 2020-10-04: qty 1

## 2020-10-04 MED ORDER — POTASSIUM CHLORIDE CRYS ER 20 MEQ PO TBCR: 40.0000 meq | EXTENDED_RELEASE_TABLET | Freq: Once | ORAL | Status: DC

## 2020-10-04 MED ORDER — PANTOPRAZOLE SODIUM 40 MG PO TBEC: 40.0000 mg | DELAYED_RELEASE_TABLET | Freq: Two times a day (BID) | ORAL | 0 refills | Status: DC

## 2020-10-04 MED ORDER — PANTOPRAZOLE SODIUM 40 MG PO TBEC
40.0000 mg | DELAYED_RELEASE_TABLET | Freq: Two times a day (BID) | ORAL | 0 refills | Status: AC
  Filled 2020-10-04: qty 60, 30d supply, fill #0

## 2020-10-04 MED ORDER — WARFARIN SODIUM 6 MG PO TABS
9.0000 mg | ORAL_TABLET | Freq: Once | ORAL | Status: AC
  Administered 2020-10-04: 9 mg via ORAL
  Filled 2020-10-04: qty 1

## 2020-10-04 NOTE — Progress Notes (Signed)
Nutrition Consult  Received consult for a list of dysphagia 2 foods for patient. Per SLP, recommendation is for a dysphagia 3 diet with thin liquids. Provided "IDDSI Level 6 Soft and Bite-Sized Nutrition Therapy" handout in discharge instructions for RN to print out for patient. Plans for D/C home today. No further nutrition needs at this time.   Lucas Mallow, RD, LDN, CNSC Please refer to [facility] for contact information.

## 2020-10-04 NOTE — Discharge Instructions (Addendum)
IDDSI Level 6 Soft And Bite-Sized (Blue) Nutrition Therapy  You may be prescribed a level 6 soft & bite-sized diet if you are unable to safely bite off pieces of food but you are able to chew the food into piecesthat can be swallowed. Your registered dietitian nutritionist (RDN) can give you tips to make sure your favorite foods are safe to eat.   How to Test Your Food Foods that you can easily break apart with the side of a fork or spoon are considered safe to eat on the Level 6 Soft & Bite-Sized diet. To make sure thefood is soft enough to eat, use the IDDSI fork pressure test. The IDDSI fork pressure test: Using your thumb, press down on the food item with a fork until the coloring of your nail turns white. The food passes thetest when it is completely squashed and does not go back to its original shape. Make sure your food is the right size. Food particles should be no larger than 1.5 cm x 1.5 cm. For your reference: The width of a standard dinner fork or your thumbnail is about 1.5 cm. All foods you eat must meet food test requirements. Cooking method and servingtemperature might affect texture. Ask your clinician for additional resources.    Tips Cut all your food into pieces smaller than 1.5 cm x 1.5 cm. Foods should be soft, tender, and moist so they are easier to chew and swallow. Foods that have "mixed-consistency" can separate into solids and thin liquids or multiple textures. Mixed-consistency foods may be OK to eat if prepared in a safe way. Strain the liquid from these foods before serving or blend together to make one consistency. Examples:   Cereal with milk drained Canned fruit served with juice or syrup drained Soups, stews, and some casseroles, served with thick enough broth, gravy, or sauce based on testing methods Watermelon, which is considered a juicy food "where juice separates from solid" and is excluded in most IDDSI diet levels. Moist foods are easier to swallow. To  keep foods moist, add small amounts of gravy, sauce, vegetable juice or cooking water, fruit juice, milk, or half-and-half. Avoid large chunks of food or hard foods that need to be chewed thoroughly. Avoid very sticky, chewy or crunchy foods. See Foods Not Recommended list for examples. Monitor foods while reheating to make sure they don't form a tough outer crust.    Foods Recommended and Not Recommended Food lists are based on the International Dysphagia Diet Standardization Initiative (IDDSI) Framework. Use IDDSI testing methods to confirm your foodsand drinks are safe. The following table is not a complete list of foods recommended. Other foodsmay be OK to eat as long as they meet the IDDSI food testing requirements. Food Group Foods Recommended Foods Not Recommended  Grains All well-moistened, soft cooked hot cereals with lumps no larger than 1.5 cm x1.5 cm pieces, drained and served without excess liquid. Cold cereal (if approved by your clinician) with lumps no larger than 1.5 cm x1.5 cm, softened, drained and served without excess liquid. Pregelled, soaked bread that is consistently moist throughout, with no sectionsthat have hardened. Starches including pasta, potato, and rice dishes with pieces no larger than1.5 cm x 1.5 cm. Not sticky. Couscous, quinoa, or rice held together with smooth, thick sauce that keepsrice from separating into individual grains. Soft, cooked pasta dishes with pieces no larger than 1.5 cm x 1.5 cm; saucedoes not separate from pasta. Dry bread, toast, crackers, biscuits, muffins, pancakes, waffles, bread  dressing Grainy, sticky, or glutinous rice     Protein Foods A note about red meat, pork, lamb, chicken, and casseroles: If food cannot be served soft at 1.5 cm by 1.5 cm, instead mince the food and serve moist byfollowing Level 5, Minced & Moist guidelines. Cooked, moist, soft, and tender red meat, including beef, pork, or lamb,chopped no larger than 1.5 cm x 1.5  cm pieces. Cooked, moist, soft, and tender poultry, including skinless chicken or turkey,chopped no larger than 1.5 cm pieces x 1.5 cm. Cooked, moist, soft, and tender seafood, including fish (salmon, herring, and sardines), shrimp, lobster, clams, and scallops; these foods should be softenough to break and serve in pieces no larger than 1.5 cm x 1.5 cm pieces. Eggs and egg substitutes, no larger than 1.5 cm x 1.5 cm pieces. Casseroles with soft chunks of meat, ground meats, or tender meats no larger than 1.5 cm x 1.5 cm pieces served in mildly, moderately, or extremely thicksauce with excess liquid drained Moistened soy foods, such as tofu or tempeh; soft enough to break and serve inpieces no larger than 1.5 cm x 1.5 cm pieces Moistened meat alternatives, such as veggie burgers, and sausages based onplant protein; chopped no larger than 1.5 cm x 1.5 cm pieces Sausages without skin cut into pieces no larger than 1.5 cm x 1.5 cm or ground Moistened legumes with soft skins/shells, such as dried beans, lentils, or peas no larger than 1.5 cm x 1.5 cm pieces and without tough skins. If incorporatedinto a recipe, must pass IDDSI testing methods. Protein foods in sizes larger than 1.5 cm x 1.5 cm pieces Chicken, [place] and fish with skin on or with bones Sausage skin Protein foods served with undrained thin liquids, such as beef stew, chowders,and casseroles Fish with skin on or with bones; fish that you cannot break into small pieces(1.5 cm x 1.5 cm) Fried eggs Whole nuts and seeds (including peanuts and almonds), pistachios, and sunflowerseeds Nut and seed butters, unless they are smooth and used in a recipe that meetstesting guidelines Dried beans, lentils or peas that have hard shells or skins; or do not meetsize expectations or do not pass IDDSI testing methods  Dairy and Dairy Alternatives Yogurt (without nuts or coconut), cottage cheese with lumps no larger than 1.5cm x 1.5 cm, and soft cheeses that  aren't sticky or chewy Cream cheese, sour cream, pudding, custard and whipped topping Frozen desserts such as ice cream, sherbet, malts, and frozen yogurt ifrecommended by your clinician Milk, fortified soymilk, fortified nut milk in the liquid consistencyrecommended by your clinician Yogurt with nuts or coconut Hard or dry cheese, cheeses in pieces bigger than 1.5 cm x 1.5 cm pieces, andcheese that is sticky or chewy Frozen desserts such as, ice cream, sherbet, malts, and frozen yogurt, unlessapproved by a clinician    Vegetables Soft cooked vegetables and starchy vegetables that are served steamed or boiledin pieces no larger than 1.5 cm x 1.5 cm Peas, creamed peas and creamed corn with soft skins and free of husks; may be blended or incorporated into IDDSI safe stews and soups.  If incorporated intoa recipe, must pass IDDSI testing methods. Vegetable juices must meet the IDDSI liquid consistency recommended by yourclinician All raw vegetables Stir-fried or fried vegetables Undercooked vegetables Peas with tough skins or shells Corn with husks Cooked vegetables that are fibrous, tough, firm, or stringy such as greenbeans, peapods, and rhubarb Sticky or crunchy potatoes such as mashed potatoes and potato casseroles withhard tops  Fruit Canned and cooked fruits, drained of excess juices, served cut up into piecesno larger than 1.5 cm x 1.5 cm or served minced or mashed. Soft, peeled fresh fruits such as peaches, nectarines, kiwi, cantaloupe, and, soft berries with small seeds (for example, strawberries), cut up into piecesno larger than 1.5 cm x 1.5 cm or serve minced or mashed 100% fruit juice in the liquid consistency recommended by your clinician     Fresh fruits that are difficult to chew, have peels/skins/pits/seeds that aredifficult to chew, or are hard and crunchy such as apples, grapes, or pears Stringy, high-pulp fruits, fibrous fruits such as papaya, pineapple, rhubarb ormango Fruits  with high water content or juice that separates from solid in the mouthsuch as melons (unless assessed as safe by clinician) Round or long fruits such as grapes Uncooked dried fruits such as prunes, raisins or apricots Fruit leather, fruit roll-ups, fruit snacks, dried fruits  Beverages Coffee, tea, water, 100% fruit juice, and nutritional supplements in the liquidconsistency recommended by your clinician Liquid consistencies other than the liquid consistency recommended by yourclinician  Other Prepared foods, including casseroles, salads, baked goods, and snacks made fromrecommended ingredients and served in 1.5 cm x 1.5 cm pieces Pureed strained soups or soups with small soft pieces (1.5 cm x 1.5 cm) thatmeet testing guidelines All seasonings and sweeteners, including honey Soups, stews, casseroles, and prepared foods that have pieces bigger than 1.5cm x 1.5 cm and that contain liquids that separate from the solid foods Jelly      Level 6 Soft & Bite Sized (Blue) Sample 1-Day Menu View Nutrient Info Breakfast 1 cup oatmeal, moist, excess liquid drained, with lumps no larger than 1.5 cm x1.5 cm 1 scrambled egg cut into 1.5 cm x 1.5 cm pieces 1 slice pregelled, soaked bread 2 teaspoons butter  cup mandarin oranges, drained with pieces no larger than 1.5 cm x 1.5 cm 1 cup 1% milk (in the liquid consistency recommended by your clinician)  Lunch 1 cup moist beef stew, excess liquid drained, prepared with pieces no largerthan 1.5 x 1.5 cm, and served in mildly, moderately, or extremely thick sauce  cup cottage cheese, excess liquid drained with pieces no larger than 1.5 cm [id number] cm 1 slice pregelled, soaked bread  cup fruit cocktail, canned, drained, with pieces no larger than 1.5 x 1.5 cm  cup cinnamon custard 1 cup 1% milk (in the liquid consistency recommended by your clinician)  Evening Meal 3 ounces chicken, moist, tender with pieces no larger than 1.5 x 1.5 cm 2 tablespoons of gravy   cup cooked potatoes, soft, cut into pieces no larger than 1.5 x 1.5 cm  cup cooked carrots, soft, cut into pieces no larger than 1.5 x 1.5 cm 1 slice pregelled, soaked bread 1 tablespoon margarine, soft, tub  cup canned peaches, drained, cut into pieces no larger than 1.5 x 1.5 cm 1 cup 1% milk (in the liquid consistency recommended by your clinician)  Evening Snack  cup vanilla yogurt

## 2020-10-04 NOTE — Progress Notes (Addendum)
ANTICOAGULATION CONSULT NOTE   Pharmacy Consult:  Heparin / Coumadin Indication: History of dural venous sinus thrombosis   Allergies  Allergen Reactions   Codeine     Other reaction(s): itching   No Known Allergies     Patient Measurements: Height: '5\' 6"'$  (167.6 cm) Weight: 97.8 kg (215 lb 9.8 oz) IBW/kg (Calculated) : 59.3 Heparin Dosing Weight: 81 kg  Vital Signs: Temp: 98.1 F (36.7 C) (08/03 0525) Temp Source: Oral (08/03 0525) BP: 126/68 (08/03 0525) Pulse Rate: 64 (08/03 0525)  Labs: Recent Labs    10/02/20 0346 10/02/20 0647 10/03/20 0437 10/03/20 1513 10/03/20 2216 10/04/20 0343  HGB 7.4*  --  7.4*  --   --  7.2*  HCT 23.7*  --  24.2*  --   --  24.0*  PLT 308  --  349  --   --  358  LABPROT  --  20.0*  --  17.7*  --   --   INR  --  1.7*  --  1.5*  --   --   HEPARINUNFRC  --   --   --   --  0.38 0.50  CREATININE 0.71  --  0.69  --   --  0.70     Estimated Creatinine Clearance: 81.6 mL/min (by C-G formula based on SCr of 0.7 mg/dL).   Medical History: Past Medical History:  Diagnosis Date   Anxiety    Clotting disorder (HCC)    Depression    Dyspnea    GERD (gastroesophageal reflux disease)    Headache    Lumbosacral spondylosis without myelopathy    Neuropathy    Postlaminectomy syndrome, lumbar region    Postlaminectomy syndrome, thoracic region    Stroke [facility])      Assessment: 27 YOF with history of dural sinus thrombosis on Coumadin PTA.  Patient was sent to the hospital by PCP for symptomatic anemia.  S/p EGD which showed diverticula in the sigmoid colon.  INR came down to 1.7 and Pharmacy consulted to bridge IV heparin to Coumadin.  Hemoglobin stable in the 7s post transfusion; platelet count WNL; no bleeding reported.  Heparin level therapeutic x2 on heparin 1100 units/hr. Hgb 7.2. Plt wnl. No bleeding noted. INR 1.5 - warfarin resumed yesterday.   Warfarin prior to admission regimen: warfarin '6mg'$  daily except '9mg'$  on MWF  Goal of  Therapy:  INR 2-3 Heparin level 0.3-0.7 units/ml Monitor platelets by anticoagulation protocol: Yes   Plan:  Continue heparin at 1100 units/hr Warfarin '9mg'$  x1  Monitor daily heparin level, INR, CBC, and s/s of bleeding    Cristela Felt, PharmD, BCPS Clinical Pharmacist 10/04/2020 11:54 AM  Addendum: Pharmacy asked to change to enoxaparin in preparation of discharge.   Plan: stop heparin and start enoxaparin '95mg'$  q12h

## 2020-10-04 NOTE — Progress Notes (Signed)
Physical Therapy Treatment Patient Details Name: Emily Peck MRN: [id number] DOB: Jun 13, 1955 Today's Date: 10/04/2020    History of Present Illness Pt is a 65 y/o female admitted 7/30 secondary to symptomatic anemia. Likely for EGD on 8/1. PMH includes axiety, CVA, R TKA, and visually impaired.    PT Comments    Pt supine in bed on entry, reporting she is going home today. Pt agreeable to therapy and is making progress towards her goals however continues to be limited in safe mobility by DoE, decreased strength, and endurance. Pt is mod I for bed mobility, and min guard for transfers and ambulation with her cane. Unable to tolerate stair training after ambulation, however discussed that her son will assist her into the house when she discharges this afternoon. Provided pt with gait belt and educated on how son should provide maximal support. Pt verbalizes understanding and is grateful for the belt. D/c plans remain appropriate. PT will continue to follow acutely.  Follow Up Recommendations  Home health PT     Equipment Recommendations  None recommended by PT       Precautions / Restrictions Precautions Precautions: Fall Precaution Comments: Visually impaired at baseline. Restrictions Weight Bearing Restrictions: No    Mobility  Bed Mobility Overal bed mobility: Modified Independent             General bed mobility comments: increased effort, no physical assist required    Transfers Overall transfer level: Needs assistance Equipment used: Straight cane Transfers: Sit to/from Stand Sit to Stand: Min guard         General transfer comment: Min guard for safety. Required momentum to stand.  Ambulation/Gait Ambulation/Gait assistance: Min guard Gait Distance (Feet): 50 Feet Assistive device: Straight cane Gait Pattern/deviations: Step-through pattern;Decreased stride length Gait velocity: Decreased Gait velocity interpretation: <1.31 ft/sec, indicative of  household ambulator General Gait Details: slow, waddling gait pattern. again discussed Rollator however pt declines in favor of her cane, limited by 3/4 DoE         Balance Overall balance assessment: Mild deficits observed, not formally tested                                          Cognition Arousal/Alertness: Awake/alert Behavior During Therapy: WFL for tasks assessed/performed Overall Cognitive Status: Within Functional Limits for tasks assessed                                           General Comments General comments (skin integrity, edema, etc.): Discussed stairs at home to get in, report son is able bodied and usually helps her in the house. Educated pt on safe support from son and provided her with a gait belt to improve son's ability to provide support      Pertinent Vitals/Pain Pain Assessment: Faces Faces Pain Scale: Hurts a little bit Pain Location: back Pain Descriptors / Indicators: Aching;Discomfort Pain Intervention(s): Limited activity within patient's tolerance;Monitored during session;Repositioned       Prior Function            PT Goals (current goals can now be found in the care plan section) Acute Rehab PT Goals Patient Stated Goal: to go home PT Goal Formulation: With patient Time For Goal Achievement: 10/16/20 Potential to Achieve Goals: Good Progress  towards PT goals: Progressing toward goals    Frequency    Min 3X/week      PT Plan Current plan remains appropriate       AM-PAC PT "6 Clicks" Mobility   Outcome Measure  Help needed turning from your back to your side while in a flat bed without using bedrails?: A Little Help needed moving from lying on your back to sitting on the side of a flat bed without using bedrails?: A Little Help needed moving to and from a bed to a chair (including a wheelchair)?: A Little Help needed standing up from a chair using your arms (e.g., wheelchair or bedside  chair)?: A Little Help needed to walk in hospital room?: A Little Help needed climbing 3-5 steps with a railing? : A Little 6 Click Score: 18    End of Session Equipment Utilized During Treatment: Gait belt Activity Tolerance: Patient limited by fatigue Patient left: with call bell/phone within reach;in bed Nurse Communication: Mobility status PT Visit Diagnosis: Unsteadiness on feet (R26.81);Muscle weakness (generalized) (M62.81)     Time: [id number] PT Time Calculation (min) (ACUTE ONLY): 17 min  Charges:  $Gait Training: 8-22 mins                     Mikyla Schachter B. Migdalia Dk PT, DPT Acute Rehabilitation Services Pager [phone] Office [phone]    Emily Peck 10/04/2020, 1:36 PM

## 2020-10-04 NOTE — Progress Notes (Addendum)
  Speech Language Pathology Treatment: Dysphagia  Patient Details Name: Emily Peck MRN: [id number] DOB: Jul 04, 1955 Today's Date: 10/04/2020 Time: [id number] SLP Time Calculation (min) (ACUTE ONLY): 22 min  Assessment / Plan / Recommendation Clinical Impression  Pt was seen for dysphagia treatment. She was alert and cooperative during the session. Pt reported that she has been tolerating the current diet without overt s/sx of aspiration, but that she questions how she would do with more advanced solids. Pt demonstrated coughing twice with dual consistency boluses when she used thin liquids to assist with mastication of regular texture solids. Pt stated that "I just got strangled on some of the crumbs...it do that sometimes".  Pt reported that peaches did not "stick", but that "I can feel it, it's letting me know don't be stuffing". Mastication and oral clearance were WFL. A dysphagia 3 diet with thin liquids is recommended at this time. A modified barium swallow study is recommended to further assess the oropharyngeal function. Pt reported that she has heard that she is to be discharged today. If this is the case, an outpatient study may be completed. Pt has verbalized understanding and agreement with recommendations, and has indicated that she can return as an outpatient for the MBS if, as she hopes, she is discharged today.    HPI HPI: Patient is a 65 y.o. 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 Patient reported she has h/o "very bad acid reflux" and last EGD was about 20 years ago. GI has seen patient during current admission and recommendation is for EGD/colonoscopy on 8/1.      SLP Plan  MBS       Recommendations  Diet recommendations: Thin liquid;Dysphagia 3 (mechanical soft) Liquids provided via: Straw;Cup Medication  Administration: Whole meds with puree Supervision: Patient able to self feed Compensations: Slow rate;Small sips/bites;Follow solids with liquid Postural Changes and/or Swallow Maneuvers: Seated upright 90 degrees                Oral Care Recommendations: Oral care BID Follow up Recommendations: Other (comment) (TBD) SLP Visit Diagnosis: Dysphagia, unspecified (R13.10) Plan: MBS       Emily Peck I. Emily Peck, [place], [facility] Office number [phone] Pager [phone]                Emily Peck 10/04/2020, 4:37 PM

## 2020-10-10 ENCOUNTER — Telehealth: Payer: Self-pay | Admitting: Internal Medicine

## 2020-10-10 ENCOUNTER — Telehealth: Payer: Self-pay | Admitting: Registered Nurse

## 2020-10-10 NOTE — Telephone Encounter (Signed)
-----   Message from Idamae Schuller, MD sent at 10/04/2020  1:46 PM EDT ----- Regarding: Hospital Follow up [place],  Could we make a one time hospital follow up for this patient next week. This will be a hospital follow up with some lab work and referral to ENT that the provider seeing her will make.   Sincerely, Idamae Schuller

## 2020-10-10 NOTE — Telephone Encounter (Addendum)
Return Emily Peck call,  Discharge summary was reviewed. Med list Reviewed. Methocarbamol noted, will call her pharmacy. Placed a call to Emily Peck regarding the above, no answer. Left message to return the call.

## 2020-10-10 NOTE — Telephone Encounter (Signed)
Patient states she is in need of muscle relaxer's. If they could please be called in to the pharmacy

## 2020-10-10 NOTE — Telephone Encounter (Signed)
TOC HFU appointment 10/17/20 at 9:30 am.  Unable to reach patient via telephone message left asking to call the clinic back.  Appointment card was mailed with appointment date and time.

## 2020-10-16 NOTE — Telephone Encounter (Signed)
Transition Care Management Unsuccessful Follow-up Telephone Call  Date of discharge and from where:  Discharged 10/04/20 from the hospital.  Attempts:  1st Attempt  Reason for unsuccessful TCM follow-up call:  Left voice message

## 2020-10-17 ENCOUNTER — Encounter: Payer: Medicare HMO | Admitting: Student

## 2020-10-18 NOTE — Telephone Encounter (Signed)
Transition Care Management Unsuccessful Follow-up Telephone Call  Date of discharge and from where:  Discharged 10/04/20 from the hospital.  Attempts:  2nd Attempt  Reason for unsuccessful TCM follow-up call:  Pt called; no answer.

## 2020-10-20 ENCOUNTER — Encounter: Payer: Self-pay | Admitting: Student

## 2020-10-20 ENCOUNTER — Other Ambulatory Visit: Payer: Self-pay

## 2020-10-20 ENCOUNTER — Ambulatory Visit (INDEPENDENT_AMBULATORY_CARE_PROVIDER_SITE_OTHER): Payer: Medicare HMO | Admitting: Student

## 2020-10-20 VITALS — BP 115/69 | HR 71 | Temp 98.6°F | Ht 63.0 in | Wt 213.1 lb

## 2020-10-20 DIAGNOSIS — D509 Iron deficiency anemia, unspecified: Secondary | ICD-10-CM | POA: Diagnosis not present

## 2020-10-20 DIAGNOSIS — D508 Other iron deficiency anemias: Secondary | ICD-10-CM | POA: Diagnosis not present

## 2020-10-20 NOTE — Patient Instructions (Signed)
We are going to schedule you an IV iron infusion appointment.   Please keep your regularly scheduled appointment with your PCP. Please ask them about an ENT referral for your frequent nose bleeds.   Please let us know if you need anything else.

## 2020-10-23 DIAGNOSIS — D509 Iron deficiency anemia, unspecified: Secondary | ICD-10-CM | POA: Insufficient documentation

## 2020-10-23 NOTE — Progress Notes (Signed)
   CC: F/u after recent hospitalization   HPI:  Ms.Denesia L Belmonte is a 65 y.o. F with PMH below who presents for f/u after recent hospitalization. Please see assessment and plan under notes tab for further details.    Past Medical History:  Diagnosis Date   Anxiety    Clotting disorder ([place])    Depression    Dyspnea    GERD (gastroesophageal reflux disease)    Headache    Lumbosacral spondylosis without myelopathy    Neuropathy    Postlaminectomy syndrome, lumbar region    Postlaminectomy syndrome, thoracic region    Stroke [facility])    Review of Systems: Please see assessment and plan under notes tab for further details.    Physical Exam:  Vitals:   10/20/20 0948  BP: 115/69  Pulse: 71  Temp: 98.6 F (37 C)  TempSrc: Oral  SpO2: 100%  Weight: 213 lb 1.6 oz (96.7 kg)  Height: '5\' 3"'$  (1.6 m)   Gen: No acute distress, in wheel chair  CV: RRR, no murmurs Pulm: Non labored breathing on RA, no wheezing or crackles  Abd: Soft, NT, ND  Ext: No edema of BLE   Assessment & Plan:   See Encounters Tab for problem based charting.  Patient discussed with Dr. Jimmye Norman

## 2020-10-23 NOTE — Assessment & Plan Note (Signed)
Patient was hospitalized 7/30-8/3 due to weakness and DOE she was found to have Hgb 6.0 and ferritin of 7 and TSAT of 2%. Patient reported chronic NSAID use. An EGD and colonoscopy were performed. EGD showed erosive gastropathy, superficial erosions and superficial ulcerations. This was thought to be the etiology of her anemia. She was given 1 u of pRBCs and 1x dose of Infed '500mg'$ . On discharge her Hgb was 7.2. Patient recently seen by GI (Dr. Collene Mares). She was noted to have a Hgb of 9.4. She denies any dark tarry stools or BRBPR or hematemesis.   She will have follow up with her PCP Dr. Saverio Danker with [facility] in the next week.   -Patient scheduled for 2 additional doses of IV iron.  -She will follow up with her PCP for additional labs.   Regarding her nose bleeds that she first mentioned to our inpatient team. Patient will ask her PCP for a referral to ENT for further evaluation.

## 2020-10-24 ENCOUNTER — Telehealth: Payer: Self-pay

## 2020-10-24 NOTE — Telephone Encounter (Signed)
Received a phone call from Dr. Saverio Danker w/ [facility] who states she is patient's PCP.  She is asking if patient has been scheduled to receive an iron infusion and states she is the patient's PCP and knows nothing about this.    Pt had an office visit with Dr. Eulas Post on 10/20/20 for a HFU and per his office notes, it is noted that Dr. Saverio Danker is patient's PCP and pt is to schedule f/u with Dr. Jimmye Norman.  However, Dr. Eulas Post is now listed as patient's PCP in epic.  Pt has an iron infusion scheduled for 11/02/20 which was ordered by Dr. Eulas Post.  Dr. Jimmye Norman is confused about why [facility] is seeing another providers patient for a HFU,  because she also sees patient for HFU.  She states she was aware pt had a f/u on Friday, but assumed it was with GI. Dr. Jimmye Norman very pleasant during conversation, asking if someone could call and discuss situation with her [phone]  States when she saw the patient, she knew nothing about scheduled iron infusion and that the patient is also confused.  Will forward to Dr. Leafy Kindle, as well as Dorris and Dr. Eulas Post. Thank you, SChaplin, RN,BSN

## 2020-10-24 NOTE — Telephone Encounter (Signed)
I called and left a message with Pt iron infusion  appt   11/02/20  @ 8 am

## 2020-10-24 NOTE — Addendum Note (Signed)
Addended by: Althea Grimmer on: 10/24/2020 09:05 AM   Modules accepted: Orders, SmartSet

## 2020-10-31 NOTE — Progress Notes (Signed)
Internal Medicine Clinic Attending  Case discussed with Dr. Carter  At the time of the visit.  We reviewed the resident's history and exam and pertinent patient test results.  I agree with the assessment, diagnosis, and plan of care documented in the resident's note.  

## 2020-11-02 ENCOUNTER — Encounter (HOSPITAL_COMMUNITY): Payer: Medicare HMO

## 2020-11-10 ENCOUNTER — Other Ambulatory Visit: Payer: Self-pay

## 2020-11-10 ENCOUNTER — Ambulatory Visit (HOSPITAL_COMMUNITY)
Admission: RE | Admit: 2020-11-10 | Discharge: 2020-11-10 | Disposition: A | Discharge status: Home / Self Care | Payer: Medicare HMO | Source: Ambulatory Visit | Attending: Internal Medicine | Admitting: Internal Medicine

## 2020-11-10 DIAGNOSIS — D509 Iron deficiency anemia, unspecified: Secondary | ICD-10-CM | POA: Insufficient documentation

## 2020-11-10 MED ORDER — SODIUM CHLORIDE 0.9 % IV SOLN
500.0000 mg | Freq: Every day | INTRAVENOUS | Status: DC
  Administered 2020-11-10: 500 mg via INTRAVENOUS
  Filled 2020-11-10: qty 10

## 2020-11-16 ENCOUNTER — Encounter (HOSPITAL_COMMUNITY): Payer: Medicare HMO

## 2020-11-20 ENCOUNTER — Other Ambulatory Visit: Payer: Self-pay

## 2020-11-20 ENCOUNTER — Encounter (HOSPITAL_COMMUNITY)
Admission: RE | Admit: 2020-11-20 | Discharge: 2020-11-20 | Disposition: A | Discharge status: Home / Self Care | Payer: Medicare HMO | Source: Ambulatory Visit | Attending: Internal Medicine | Admitting: Internal Medicine

## 2020-11-20 DIAGNOSIS — D509 Iron deficiency anemia, unspecified: Secondary | ICD-10-CM | POA: Insufficient documentation

## 2020-11-20 MED ORDER — SODIUM CHLORIDE 0.9 % IV SOLN
500.0000 mg | Freq: Every day | INTRAVENOUS | Status: DC
  Administered 2020-11-20: 500 mg via INTRAVENOUS
  Filled 2020-11-20: qty 10

## 2020-12-29 ENCOUNTER — Other Ambulatory Visit: Payer: Self-pay

## 2020-12-29 ENCOUNTER — Encounter: Payer: Self-pay | Admitting: Registered Nurse

## 2020-12-29 ENCOUNTER — Encounter: Payer: Medicare HMO | Attending: Registered Nurse | Admitting: Registered Nurse

## 2020-12-29 VITALS — BP 109/70 | HR 77 | Ht 63.0 in | Wt 201.0 lb

## 2020-12-29 DIAGNOSIS — M47817 Spondylosis without myelopathy or radiculopathy, lumbosacral region: Secondary | ICD-10-CM | POA: Insufficient documentation

## 2020-12-29 DIAGNOSIS — M25561 Pain in right knee: Secondary | ICD-10-CM | POA: Insufficient documentation

## 2020-12-29 DIAGNOSIS — G894 Chronic pain syndrome: Secondary | ICD-10-CM | POA: Diagnosis present

## 2020-12-29 DIAGNOSIS — G8929 Other chronic pain: Secondary | ICD-10-CM | POA: Diagnosis present

## 2020-12-29 DIAGNOSIS — Z5181 Encounter for therapeutic drug level monitoring: Secondary | ICD-10-CM | POA: Diagnosis present

## 2020-12-29 DIAGNOSIS — M5412 Radiculopathy, cervical region: Secondary | ICD-10-CM | POA: Diagnosis present

## 2020-12-29 DIAGNOSIS — M25562 Pain in left knee: Secondary | ICD-10-CM | POA: Insufficient documentation

## 2020-12-29 DIAGNOSIS — M546 Pain in thoracic spine: Secondary | ICD-10-CM | POA: Insufficient documentation

## 2020-12-29 DIAGNOSIS — M7061 Trochanteric bursitis, right hip: Secondary | ICD-10-CM | POA: Diagnosis present

## 2020-12-29 DIAGNOSIS — M7062 Trochanteric bursitis, left hip: Secondary | ICD-10-CM | POA: Insufficient documentation

## 2020-12-29 DIAGNOSIS — M5416 Radiculopathy, lumbar region: Secondary | ICD-10-CM | POA: Insufficient documentation

## 2020-12-29 DIAGNOSIS — M542 Cervicalgia: Secondary | ICD-10-CM | POA: Insufficient documentation

## 2020-12-29 DIAGNOSIS — Z79891 Long term (current) use of opiate analgesic: Secondary | ICD-10-CM | POA: Diagnosis present

## 2020-12-29 MED ORDER — ACETAMINOPHEN-CODEINE #4 300-60 MG PO TABS: 1.0000 | ORAL_TABLET | Freq: Four times a day (QID) | ORAL | 2 refills | Status: DC | PRN

## 2020-12-29 NOTE — Progress Notes (Signed)
Subjective:    Patient ID: Emily Peck, female    DOB: 12/31/1955, 65 y.o.   MRN: [id number]  HPI: Emily Peck is a 65 y.o. female who returns for follow up appointment for chronic pain and medication refill. She states her pain is located in her neck radiating into her bilateral shoulders, mid- lower back pain radiating into her bilateral hips and bilateral lower extremities. She also reports bilateral knee pain. She rates her pain 4. Her current exercise regime is walking and performing stretching exercises.  Emily Peck Morphine equivalent is 36.00  MME.   Last Oral Swab was Performed on 09/22/2020, it was consistent.     Pain Inventory Average Pain 7 Pain Right Now 4 My pain is aching  In the last 24 hours, has pain interfered with the following? General activity 9 Relation with others 9 Enjoyment of life 8 What TIME of day is your pain at its worst? daytime Sleep (in general) Fair  Pain is worse with: walking, bending, and standing Pain improves with: rest and heat/ice Relief from Meds: 4  Family History  Problem Relation Age of Onset   Cancer Mother    Diabetes Mother    Social History   Socioeconomic History   Marital status: Single    Spouse name: Not on file   Number of children: 2   Years of education: Not on file   Highest education level: Not on file  Occupational History   Not on file  Tobacco Use   Smoking status: Never   Smokeless tobacco: Never  Vaping Use   Vaping Use: Never used  Substance and Sexual Activity   Alcohol use: No   Drug use: No   Sexual activity: Not on file  Other Topics Concern   Not on file  Social History Narrative   Not on file   Social Determinants of Health   Financial Resource Strain: Not on file  Food Insecurity: Not on file  Transportation Needs: Not on file  Physical Activity: Not on file  Stress: Not on file  Social Connections: Not on file   Past Surgical History:  Procedure Laterality Date    ABDOMINAL HYSTERECTOMY     BACK SURGERY     BIOPSY  10/02/2020   Procedure: BIOPSY;  Surgeon: Juanita Craver, MD;  Location: [place];  Service: Endoscopy;;   COLONOSCOPY WITH PROPOFOL N/A 10/02/2020   Procedure: COLONOSCOPY WITH PROPOFOL;  Surgeon: Juanita Craver, MD;  Location[facility]COPY;  Service: Endoscopy;  Laterality: N/A;   ESOPHAGOGASTRODUODENOSCOPY (EGD) WITH PROPOFOL N/A 10/02/2020   Procedure: ESOPHAGOGASTRODUODENOSCOPY (EGD) WITH PROPOFOL;  Surgeon: Juanita Craver, MD;  Location: [facility] ENDOSCOPY;  Service: Endoscopy;  Laterality: N/A;   EYE SURGERY     eye removed right    GIVENS CAPSULE STUDY N/A 10/02/2020   Procedure: GIVENS CAPSULE STUDY;  Surgeon: Juanita Craver, MD;  Location: [facility] ENDOSCOPY;  Service: Endoscopy;  Laterality: N/A;   KNEE ARTHROSCOPY     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   Procedure: TOTAL KNEE ARTHROPLASTY;  Surgeon: Melrose Nakayama, MD;  Location: [place];  Service: Orthopedics;  Laterality: Right;   Past Surgical History:  Procedure Laterality Date   ABDOMINAL HYSTERECTOMY     BACK SURGERY     BIOPSY  10/02/2020   Procedure: BIOPSY;  Surgeon: Juanita Craver, MD;  Location: [place];  Service: Endoscopy;;   COLONOSCOPY WITH PROPOFOL N/A 10/02/2020   Procedure: COLONOSCOPY WITH PROPOFOL;  Surgeon: Juanita Craver, MD;  Location: [facility]  ENDOSCOPY;  Service: Endoscopy;  Laterality: N/A;   ESOPHAGOGASTRODUODENOSCOPY (EGD) WITH PROPOFOL N/A 10/02/2020   Procedure: ESOPHAGOGASTRODUODENOSCOPY (EGD) WITH PROPOFOL;  Surgeon: Juanita Craver, MD;  Location: [facility] ENDOSCOPY;  Service: Endoscopy;  Laterality: N/A;   EYE SURGERY     eye removed right    GIVENS CAPSULE STUDY N/A 10/02/2020   Procedure: GIVENS CAPSULE STUDY;  Surgeon: Juanita Craver, MD;  Location: [facility] ENDOSCOPY;  Service: Endoscopy;  Laterality: N/A;   KNEE ARTHROSCOPY     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   Procedure: TOTAL KNEE ARTHROPLASTY;  Surgeon: Melrose Nakayama, MD;  Location: [place];  Service: Orthopedics;   Laterality: Right;   Past Medical History:  Diagnosis Date   Anxiety    Clotting disorder (HCC)    Depression    Dyspnea    GERD (gastroesophageal reflux disease)    Headache    Lumbosacral spondylosis without myelopathy    Neuropathy    Postlaminectomy syndrome, lumbar region    Postlaminectomy syndrome, thoracic region    Stroke (HCC)    Ht 5\' 3"  (1.6 m)   Wt 201 lb (91.2 kg)   BMI 35.61 kg/m   Opioid Risk Score:   Fall Risk Score:  `1  Depression screen PHQ 2/9  Depression screen [facility] 2/9 12/29/2020 10/20/2020 09/22/2020 03/17/2020 07/22/2019 03/09/2019 06/09/2018  Decreased Interest 0 0 0 0 0 0 1  Down, Depressed, Hopeless 0 0 0 0 0 0 1  PHQ - 2 Score 0 0 0 0 0 0 2  Altered sleeping - 0 - - - - -  Tired, decreased energy - 0 - - - - -  Change in appetite - 0 - - - - -  Feeling bad or failure about yourself  - 0 - - - - -  Trouble concentrating - 0 - - - - -  Moving slowly or fidgety/restless - 0 - - - - -  Suicidal thoughts - 0 - - - - -  PHQ-9 Score - 0 - - - - -  Difficult doing work/chores - Not difficult at all - - - - -  Some recent data might be hidden     Review of Systems  Constitutional: Negative.   HENT: Negative.    Eyes: Negative.   Respiratory: Negative.    Cardiovascular: Negative.   Gastrointestinal: Negative.   Endocrine: Negative.   Genitourinary: Negative.   Musculoskeletal:  Positive for back pain.  Skin: Negative.   Allergic/Immunologic: Negative.   Neurological: Negative.   Hematological: Negative.   Psychiatric/Behavioral: Negative.        Objective:   Physical Exam Vitals and nursing note reviewed.  Constitutional:      Appearance: Normal appearance.  Neck:     Comments: Cervical Paraspinal Tenderness: C-5-C-6  Cardiovascular:     Rate and Rhythm: Normal rate and regular rhythm.     Pulses: Normal pulses.     Heart sounds: Normal heart sounds.  Pulmonary:     Effort: Pulmonary effort is normal.     Breath sounds: Normal breath  sounds.  Musculoskeletal:     Cervical back: Normal range of motion and neck supple.     Comments: Normal Muscle Bulk and Muscle Testing Reveals:  Upper Extremities: Full ROM and Muscle Strength 5/5 Thoracic Paraspinal Tenderness: T-1-T-3 Lumbar Paraspinal Tenderness: L-3-L-5 Bilateral Greater Trochanter Tenderness Lower Extremities: Full ROM and Muscle Strength 5/5 Arises from chair slowly using cane for support Narrow Based  Gait     Skin:  General: Skin is warm and dry.  Neurological:     Mental Status: She is alert and oriented to person, place, and time.  Psychiatric:        Mood and Affect: Mood normal.        Behavior: Behavior normal.         Assessment & Plan:  1.Cervicalgia:Cervical Radiculitis Continue HEP as Tolerated. Continue to Monitor. 12/29/2020 2. Rotator Cuff Syndrome of Both Shoulders: Continue HEP as Tolerated. Continue to Monitor. 12/29/2020 3. 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 12/29/2020 Refilled:Tylenol #4 one tablet 4 times a day as needed #120 We will continue the opioid monitoring program, this consists of regular clinic visits, examinations, urine drug screen, pill counts as well as use of [place] Controlled Substance Reporting system. A 12 month History has been reviewed on the [place] Controlled Substance Reporting System on 12/29/2020 4. Lumbosacral Spondylosis/ Lumbar Radiculitis: Continue current medication regimen with Gabapentin. S/ P Right MBB on 10/01/2019. Continue to monitor. 12/29/2020 5. Muscle Spasm: Continue current medication regimen with Methocarbamol.  12/29/2020. 6. Chronic Bilateral Knee Pain: Continue HEP as Tolerated. Continue current medication regimen. Continue to Monitor.   F/U in 3 months

## 2021-01-01 ENCOUNTER — Emergency Department (HOSPITAL_COMMUNITY): Payer: Medicare HMO

## 2021-01-01 ENCOUNTER — Emergency Department (HOSPITAL_COMMUNITY)
Admission: EM | Admit: 2021-01-01 | Discharge: 2021-01-01 | Disposition: A | Discharge status: Home / Self Care | Payer: Medicare HMO | Attending: Emergency Medicine | Admitting: Emergency Medicine

## 2021-01-01 DIAGNOSIS — R42 Dizziness and giddiness: Secondary | ICD-10-CM | POA: Insufficient documentation

## 2021-01-01 DIAGNOSIS — Z7901 Long term (current) use of anticoagulants: Secondary | ICD-10-CM | POA: Insufficient documentation

## 2021-01-01 DIAGNOSIS — Z96651 Presence of right artificial knee joint: Secondary | ICD-10-CM | POA: Diagnosis not present

## 2021-01-01 LAB — URINALYSIS, ROUTINE W REFLEX MICROSCOPIC
Bacteria, UA: NONE SEEN
Bilirubin Urine: NEGATIVE
Glucose, UA: NEGATIVE mg/dL
Ketones, ur: NEGATIVE mg/dL
Leukocytes,Ua: NEGATIVE
Nitrite: NEGATIVE
Protein, ur: NEGATIVE mg/dL
Specific Gravity, Urine: 1.014 (ref 1.005–1.030)
pH: 5 (ref 5.0–8.0)

## 2021-01-01 LAB — BASIC METABOLIC PANEL
Anion gap: 6 (ref 5–15)
BUN: 15 mg/dL (ref 8–23)
CO2: 23 mmol/L (ref 22–32)
Calcium: 9 mg/dL (ref 8.9–10.3)
Chloride: 108 mmol/L (ref 98–111)
Creatinine, Ser: 0.86 mg/dL (ref 0.44–1.00)
GFR, Estimated: >60 mL/min (ref >60)
Glucose, Bld: 76 mg/dL (ref 70–99)
Potassium: 3.8 mmol/L (ref 3.5–5.1)
Sodium: 137 mmol/L (ref 135–145)

## 2021-01-01 LAB — CBC
HCT: 37.3 % (ref 36.0–46.0)
Hemoglobin: 11.4 g/dL — ABNORMAL LOW (ref 12.0–15.0)
MCH: 30.6 pg (ref 26.0–34.0)
MCHC: 30.6 g/dL (ref 30.0–36.0)
MCV: 100 fL (ref 80.0–100.0)
Platelets: 217 10*3/uL (ref 150–400)
RBC: 3.73 MIL/uL — ABNORMAL LOW (ref 3.87–5.11)
RDW: 17.8 % — ABNORMAL HIGH (ref 11.5–15.5)
WBC: 6.9 10*3/uL (ref 4.0–10.5)
nRBC: 0 % (ref 0.0–0.2)

## 2021-01-01 LAB — PROTIME-INR
INR: 3.2 — ABNORMAL HIGH (ref 0.8–1.2)
Prothrombin Time: 33.1 seconds — ABNORMAL HIGH (ref 11.4–15.2)

## 2021-01-01 NOTE — ED Notes (Signed)
Patient transported to MRI 

## 2021-01-01 NOTE — ED Triage Notes (Signed)
Pt. Stated, Donnald Garre had some light headedness for about 3-4 days, Ive fallen a few times, and my breathing is not good.

## 2021-01-01 NOTE — ED Notes (Signed)
Pt remains off the unit in MRI at this time.

## 2021-01-01 NOTE — ED Notes (Signed)
Pt provided discharge instructions and prescription information. Pt was given the opportunity to ask questions and questions were answered. Discharge signature not obtained in the setting of the COVID-19 pandemic in order to reduce high touch surfaces.   Pt encouraged to use her walker at home for ambulation as opposed to a cane. Pt to f/u with PCP in upcoming week.

## 2021-01-01 NOTE — ED Provider Notes (Signed)
Emergency Medicine Provider Triage Evaluation Note  Emily Peck , a 65 y.o. female  was evaluated in triage.  Pt complains of lightheadedness and shortness of breath of 4-day duration.  Numbness duration patient also reports multiple falls within the house. On warfarin.   Review of Systems  Positive: Falls, lightheadedness, change in vision, shortness of Negative: Fever, headache, abdominal pain, nausea  Physical Exam  BP 117/74 (BP Location: Right Arm)   Pulse 62   Temp 98.2 F (36.8 C) (Oral)   Resp 16   SpO2 100%  Gen:   Awake, no distress   Resp:  Normal effort  MSK:   Moves extremities without difficulty  Other:    Medical Decision Making  Medically screening exam initiated at 9:48 AM.  Appropriate orders placed.  Emily Peck was informed that the remainder of the evaluation will be completed by another provider, this initial triage assessment does not replace that evaluation, and the importance of remaining in the ED until their evaluation is complete.     Evlyn Courier, PA-C 01/01/21 [id number]    Lorelle Gibbs, DO 01/02/21 [phone]

## 2021-01-01 NOTE — ED Provider Notes (Signed)
[facility] EMERGENCY DEPARTMENT Provider Note  CSN: [id number] Arrival date & time: 01/01/21 0935    History Chief Complaint  Patient presents with  . Dizziness  . Shortness of Breath    Emily Peck is a 65 y.o. 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 Similar to when she had symptomatic anemia 3 months ago except this time she is not having ringing in her ears. She reports she feels off balance when she stands to walk. She denies any CP but states her 'breathing has been off'. No fever. She has been falling at home. She reports prior history of stroke but did not have any residual deficits.    Past Medical History:  Diagnosis Date  . Anxiety   . Clotting disorder ([place])   . Depression   . Dyspnea   . GERD (gastroesophageal reflux disease)   . Headache   . Lumbosacral spondylosis without myelopathy   . Neuropathy   . Postlaminectomy syndrome, lumbar region   . Postlaminectomy syndrome, thoracic region   . Stroke [facility])     Past Surgical History:  Procedure Laterality Date  . ABDOMINAL HYSTERECTOMY    . BACK SURGERY    . BIOPSY  10/02/2020   Procedure: BIOPSY;  Surgeon: Juanita Craver, MD;  Location: [place];  Service: Endoscopy;;  . COLONOSCOPY WITH PROPOFOL N/A 10/02/2020   Procedure: COLONOSCOPY WITH PROPOFOL;  Surgeon: Juanita Craver, MD;  Location: [facility] ENDOSCOPY;  Service: Endoscopy;  Laterality: N/A;  . ESOPHAGOGASTRODUODENOSCOPY (EGD) WITH PROPOFOL N/A 10/02/2020   Procedure: ESOPHAGOGASTRODUODENOSCOPY (EGD) WITH PROPOFOL;  Surgeon: Juanita Craver, MD;  Location: [facility] ENDOSCOPY;  Service: Endoscopy;  Laterality: N/A;  . EYE SURGERY     eye removed right   . GIVENS CAPSULE STUDY N/A 10/02/2020   Procedure: GIVENS CAPSULE STUDY;  Surgeon: Juanita Craver, MD;  [facility] END[facility]COPY;  Service: Endoscopy;  Laterality: N/A;  . KNEE ARTHROSCOPY    . 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    Procedure: TOTAL KNEE ARTHROPLASTY;  Surgeon: Melrose Nakayama, MD;  Location: [place];  Service: Orthopedics;  Laterality: Right;    Family History  Problem Relation Age of Onset  . Cancer Mother   . Diabetes Mother     Social History   Tobacco Use  . Smoking status: Never  . Smokeless tobacco: Never  Vaping Use  . Vaping Use: Never used  Substance Use Topics  . Alcohol use: No  . Drug use: No     Home Medications Prior to Admission medications   Medication Sig Start Date End Date Taking? Authorizing Provider  acetaminophen-codeine (TYLENOL #4) 300-60 MG tablet Take 1 tablet by mouth 4 (four) times daily as needed. for pain 12/29/20   Bayard Hugger, NP  Ascorbic Acid (VITAMIN C PO) Take 1 tablet by mouth daily.    [provider]  B Complex Vitamins (VITAMIN B COMPLEX PO) Take 1 tablet by mouth daily.    [provider]  Camphor-Eucalyptus-Menthol (VICKS VAPORUB EX) Apply topically. Uses on feet prn for pain  (family dollar brand)    [provider]  diphenhydrAMINE (BENADRYL) 25 MG tablet Take 25-50 mg by mouth 4 (four) times daily as needed for itching. Takes with Tylenol #4 due to codeine allergy.    [provider]  enoxaparin (LOVENOX) 100 MG/ML injection Inject 1 syringe into the skin every 12 (twelve) hours for 5 days. 10/04/20 10/12/20  Virl Axe, MD  escitalopram (LEXAPRO) 20 MG tablet Take 20 mg by mouth at bedtime.  12/13/13   [provider]  furosemide (LASIX) 20 MG tablet Take 1 tablet (20 mg total) by mouth daily for 7 days. 05/10/20 11/11/20  Carmin Muskrat, MD  gabapentin (NEURONTIN) 600 MG tablet Take 2 tablets (1,200 mg total) by mouth 3 (three) times daily. 03/29/20   Lomax, Amy, NP  Lido-Capsaicin-Men-Methyl Sal 0.5-0.035-5-20 % PTCH Place one patch on the bottom of each foot every night at bedtime 03/29/20   Lomax, Amy, NP  methocarbamol (ROBAXIN) 500 MG tablet Take 1 tablet (500 mg total) by mouth every 8 (eight)  hours as needed for muscle spasms. 06/15/20   Bayard Hugger, NP  pantoprazole (PROTONIX) 40 MG tablet Take 1 tablet (40 mg total) by mouth 2 (two) times daily. 10/04/20   Virl Axe, MD  potassium chloride (KLOR-CON) 10 MEQ tablet Take 10 mEq by mouth daily. 01/04/19   [provider]  Propylene Glycol 0.6 % SOLN Place 1 drop into both eyes as needed (dry eyes).    [provider]  sodium chloride (OCEAN) 0.65 % SOLN nasal spray Place 1 spray into both nostrils as needed for congestion.    [provider]  topiramate (TOPAMAX) 200 MG tablet Take 200 mg by mouth at bedtime. 03/30/20   [provider]  traZODone (DESYREL) 150 MG tablet Take 150 mg by mouth at bedtime. 07/13/14   [provider]  VITAMIN D PO Take 1 tablet by mouth daily.    [provider]  warfarin (COUMADIN) 6 MG tablet Take 6-9 mg by mouth daily. Take 9mg  Monday, Wednesday, and Fridays, then all other days take 6mg s daily    [provider]     Allergies    Codeine and No known allergies   Review of Systems   Review of Systems A comprehensive review of systems was completed and negative except as noted in HPI.    Physical Exam BP 120/67 (BP Location: Right Arm)   Pulse (!) 57   Temp 98.2 F (36.8 C) (Oral)   Resp 17   SpO2 100%   Physical Exam Vitals and nursing note reviewed.  Constitutional:      Appearance: Normal appearance.  HENT:     Head: Normocephalic and atraumatic.     Nose: Nose normal.     Mouth/Throat:     Mouth: Mucous membranes are moist.  Eyes:     Extraocular Movements: Extraocular movements intact.     Conjunctiva/sclera: Conjunctivae normal.     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  Pulmonary:     Effort: Pulmonary effort is normal.     Breath sounds: Normal breath sounds.  Abdominal:     General: Abdomen is flat.     Palpations: Abdomen is soft.     Tenderness: There is no  abdominal tenderness.  Musculoskeletal:        General: No swelling. Normal range of motion.     Cervical back: Neck supple.  Skin:    General: Skin is warm and dry.  Neurological:     General: No focal deficit present.     Mental Status: She is alert and oriented to person, place, and time.     Cranial Nerves: No cranial nerve deficit.     Sensory: No sensory deficit.     Motor: No weakness.     Coordination: Coordination normal.  Psychiatric:  Mood and Affect: Mood normal.     ED Results / Procedures / Treatments   Labs (all labs ordered are listed, but only abnormal results are displayed) Labs Reviewed  CBC - Abnormal; Notable for the following components:      Result Value   RBC 3.73 (*)    Hemoglobin 11.4 (*)    RDW 17.8 (*)    All other components within normal limits  BASIC METABOLIC PANEL  URINALYSIS, ROUTINE W REFLEX MICROSCOPIC  CBG MONITORING, ED    EKG EKG Interpretation  Date/Time:  Monday January 01 2021 09:41:08 EDT Ventricular Rate:  59 PR Interval:  160 QRS Duration: 74 QT Interval:  430 QTC Calculation: 425 R Axis:   9 Text Interpretation: Sinus bradycardia Nonspecific T wave abnormality Abnormal ECG No significant change since last tracing Confirmed by Calvert Cantor [id number]) on 01/01/2021 11:16:59 AM  Radiology DG Chest 2 View  Result Date: 01/01/2021 CLINICAL DATA:  shortness of breath EXAM: CHEST - 2 VIEW COMPARISON:  March 2022 FINDINGS: Hypoventilatory exam results in bronchovascular crowding at the lung bases. The cardiomediastinal silhouette appears within normal limits given technique. Partially visualized thoracolumbar posterior fusion hardware. No pleural effusion. No pneumothorax. No consolidative opacity. No acute osseous abnormality. IMPRESSION: No acute abnormality identified within the chest within the limits of a hypoventilatory exam. Electronically Signed   By: Albin Felling M.D.   On: 01/01/2021 10:21   CT Head Wo  Contrast  Result Date: 01/01/2021 CLINICAL DATA:  Headache, new or worsening. Lightheaded. Falling and dizziness. EXAM: CT HEAD WITHOUT CONTRAST TECHNIQUE: Contiguous axial images were obtained from the base of the skull through the vertex without intravenous contrast. COMPARISON:  11/29/2016 FINDINGS: Brain: Newly seen area of indistinct low-density within the right cerebellum, not present in 2018. The age of this cerebellar infarction is uncertain but recent infarction is not excluded. No evidence of hemorrhage. Cerebral hemispheres show moderate changes of chronic small vessel disease affecting the white matter, progressive since 2018. No definite acute infarction. No mass lesion, hemorrhage, hydrocephalus or extra-axial collection. Vascular: There is atherosclerotic calcification of the major vessels at the base of the brain. Skull: Negative Sinuses/Orbits: Sinuses are clear.  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 Other: None IMPRESSION: Newly seen area of low-density in the right cerebellum, not present in 2018. This is consistent with a cerebellar stroke but is age indeterminate. It could possibly be recent. No associated hemorrhage. Moderate chronic small-vessel ischemic changes of the cerebral hemispheric white matter, progressive since 2018. Electronically Signed   By: Nelson Chimes M.D.   On: 01/01/2021 11:16    Procedures Procedures  Medications Ordered in the ED Medications - No data to display   MDM Rules/Calculators/A&P MDM  Patient with nonspecific dizziness, CT head shows possible cerebellar infarct. Will send for MRI. CBC and BMP are unremarkable. INR added.  ED Course  I have reviewed the triage vital signs and the nursing notes.  Pertinent labs & imaging results that were available during my care of the patient were reviewed by me and considered in my medical decision making (see chart for details).  Clinical Course as of 01/01/21 [place] Jan 01, 2021  1222 Patient not a candidate  for tPA or LVO intervention given duration of her symptoms.  [CS]  9242 INR is therapeutic [CS]  6834 MRI is neg for stroke. She is feeling better, would like to go home. No other concerning findings with her ED workup and she has been able to ambulate  with a steady gait. Advised to follow up with her PCP or [facility] if symptoms worsen.  [CS]    Clinical Course User Index [CS] Truddie Hidden, MD    Final Clinical Impression(s) / ED Diagnoses Final diagnoses:  None    Rx / DC Orders ED Discharge Orders     None        Truddie Hidden, MD 01/01/21 1553

## 2021-01-01 NOTE — ED Notes (Addendum)
Intermittent ShOB for a few days. Pt denies any ShOB at time of assessment. Pt also associates lightheadedness that happens with orthostatic changes.

## 2021-02-14 ENCOUNTER — Telehealth: Payer: Self-pay | Admitting: Physical Medicine & Rehabilitation

## 2021-02-14 NOTE — Telephone Encounter (Signed)
Patient would like a shot in tailbone she states, as soon as possible. Please advise what procedure we may schedule her for and we will call and set up.  Thanks.

## 2021-02-23 ENCOUNTER — Encounter: Payer: Self-pay | Admitting: Nurse Practitioner

## 2021-03-09 ENCOUNTER — Encounter: Payer: Medicare HMO | Attending: Registered Nurse | Admitting: Registered Nurse

## 2021-03-09 ENCOUNTER — Other Ambulatory Visit: Payer: Self-pay

## 2021-03-09 VITALS — BP 110/74 | HR 71 | Ht 63.0 in | Wt 198.0 lb

## 2021-03-09 DIAGNOSIS — M546 Pain in thoracic spine: Secondary | ICD-10-CM | POA: Diagnosis present

## 2021-03-09 DIAGNOSIS — G8929 Other chronic pain: Secondary | ICD-10-CM | POA: Insufficient documentation

## 2021-03-09 DIAGNOSIS — Z79891 Long term (current) use of opiate analgesic: Secondary | ICD-10-CM | POA: Insufficient documentation

## 2021-03-09 DIAGNOSIS — M5412 Radiculopathy, cervical region: Secondary | ICD-10-CM | POA: Diagnosis present

## 2021-03-09 DIAGNOSIS — M25562 Pain in left knee: Secondary | ICD-10-CM | POA: Diagnosis present

## 2021-03-09 DIAGNOSIS — M7062 Trochanteric bursitis, left hip: Secondary | ICD-10-CM | POA: Diagnosis present

## 2021-03-09 DIAGNOSIS — M542 Cervicalgia: Secondary | ICD-10-CM | POA: Diagnosis not present

## 2021-03-09 DIAGNOSIS — G894 Chronic pain syndrome: Secondary | ICD-10-CM | POA: Insufficient documentation

## 2021-03-09 DIAGNOSIS — M47817 Spondylosis without myelopathy or radiculopathy, lumbosacral region: Secondary | ICD-10-CM | POA: Diagnosis present

## 2021-03-09 DIAGNOSIS — Z5181 Encounter for therapeutic drug level monitoring: Secondary | ICD-10-CM | POA: Diagnosis not present

## 2021-03-09 DIAGNOSIS — M7061 Trochanteric bursitis, right hip: Secondary | ICD-10-CM | POA: Diagnosis present

## 2021-03-09 DIAGNOSIS — M25561 Pain in right knee: Secondary | ICD-10-CM | POA: Insufficient documentation

## 2021-03-09 DIAGNOSIS — M5416 Radiculopathy, lumbar region: Secondary | ICD-10-CM | POA: Diagnosis present

## 2021-03-09 MED ORDER — ACETAMINOPHEN-CODEINE #4 300-60 MG PO TABS: 1.0000 | ORAL_TABLET | Freq: Four times a day (QID) | ORAL | 2 refills | Status: DC | PRN

## 2021-03-09 NOTE — Progress Notes (Signed)
Subjective:    Patient ID: Emily Peck, female    DOB: [birth year], 66 y.o.   MRN: [id number]  HPI: Emily Peck is a 66 y.o. female who returns for follow up appointment for chronic pain and medication refill. She states her pain is located in her  neck radiating into her bilateral shoulders and mid- lower back pain. Emily Peck states her lower back pain has increased in intensity and requesting an injection. Her last RightL5 dorsal ramus., Right L4 and Right L3 medial branch radio frequency neurotomy under fluoroscopic guidance was performed on 10/01/2019, she received good relief. Indication: Low back pain due to lumbar spondylosis which has been relieved on 2 occasions by greater than 50% by lumbar medial branch blocks and prior RFA for 64months. The above will be discussed with Dr Letta Pate, if he agrees with plan  she will be scheduled for RightL5 dorsal ramus., Right L4 and Right L3 medial branch radio frequency neurotomy under fluoroscopic guidance.  She also reports bilateral hip and bilateral knee pain. She rates her pain 7. Her current exercise regime is walking and performing stretching exercises.  Emily Peck Morphine equivalent is 36.00 MME.   Oral Swab was Performed today.    Pain Inventory Average Pain 7 Pain Right Now 7 My pain is constant, sharp, and stabbing  In the last 24 hours, has pain interfered with the following? General activity 7 Relation with others 7 Enjoyment of life 7 What TIME of day is your pain at its worst? daytime, evening, and night Sleep (in general) Fair  Pain is worse with: walking, bending, standing, and some activites Pain improves with: rest and heat/ice Relief from Meds: 7  Family History  Problem Relation Age of Onset   Cancer Mother    Diabetes Mother    Social History   Socioeconomic History   Marital status: Single    Spouse name: Not on file   Number of children: 2   Years of education: Not on file   Highest education  level: Not on file  Occupational History   Not on file  Tobacco Use   Smoking status: Never   Smokeless tobacco: Never  Vaping Use   Vaping Use: Never used  Substance and Sexual Activity   Alcohol use: No   Drug use: No   Sexual activity: Not on file  Other Topics Concern   Not on file  Social History Narrative   Not on file   Social Determinants of Health   Financial Resource Strain: Not on file  Food Insecurity: Not on file  Transportation Needs: Not on file  Physical Activity: Not on file  Stress: Not on file  Social Connections: Not on file   Past Surgical History:  Procedure Laterality Date   ABDOMINAL HYSTERECTOMY     BACK SURGERY     BIOPSY  10/02/2020   Procedure: BIOPSY;  Surgeon: Juanita Craver, MD;  Location: [place];  Service: Endoscopy;;   COLONOSCOPY WITH PROPOFOL N/A 10/02/2020   Procedure: COLONOSCOPY WITH PROPOFOL;  Surgeon: Juanita Craver, MD;  Location: [facility] ENDOSCOPY;  Service: Endoscopy;  Laterality: N/A;   ESOPHAGOGASTRODUODENOSCOPY (EGD) WITH PROPOFOL N/A 10/02/2020   Procedure: ESOPHAGOGASTRODUODENOSCOPY (EGD) WITH PROPOFOL;  Surgeon: Juanita Craver, MD;  Location: [facility] ENDOSCOPY;  Service: Endoscopy;  Laterality: N/A;   EYE SURGERY     eye removed right    GIVENS CAPSULE STUDY N/A 10/02/2020   Procedure: GIVENS CAPSULE STUDY;  Surgeon: Juanita Craver, MD;  Location: [place] ENDOSCOPY;  Service: Endoscopy;  Laterality: N/A;   KNEE ARTHROSCOPY     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   Procedure: TOTAL KNEE ARTHROPLASTY;  Surgeon: Melrose Nakayama, MD;  Location: [place];  Service: Orthopedics;  Laterality: Right;   Past Surgical History:  Procedure Laterality Date   ABDOMINAL HYSTERECTOMY     BACK SURGERY     BIOPSY  10/02/2020   Procedure: BIOPSY;  Surgeon: Juanita Craver, MD;  Location: [place];  Service: Endoscopy;;   COLONOSCOPY WITH PROPOFOL N/A 10/02/2020   Procedure: COLONOSCOPY WITH PROPOFOL;  Surgeon: Juanita Craver, MD;  Location: [facility] ENDOSCOPY;  Service:  Endoscopy;  Laterality: N/A;   ESOPHAGOGASTRODUODENOSCOPY (EGD) WITH PROPOFOL N/A 10/02/2020   Procedure: ESOPHAGOGASTRODUODENOSCOPY (EGD) WITH PROPOFOL;  Surgeon: Juanita Craver, MD;  Location: [facility] ENDOSCOPY;  Service: Endoscopy;  Laterality: N/A;   EYE SURGERY     eye removed right    GIVENS CAPSULE STUDY N/A 10/02/2020   Procedure: GIVENS CAPSULE STUDY;  Surgeon: Juanita Craver, MD;  Location: [facility] ENDOSCOPY;  Service: Endoscopy;  Laterality: N/A;   KNEE ARTHROSCOPY     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   Procedure: TOTAL KNEE ARTHROPLASTY;  Surgeon: Melrose Nakayama, MD;  Location: [place];  Service: Orthopedics;  Laterality: Right;   Past Medical History:  Diagnosis Date   Anxiety    Clotting disorder (HCC)    Depression    Dyspnea    GERD (gastroesophageal reflux disease)    Headache    Lumbosacral spondylosis without myelopathy    Neuropathy    Postlaminectomy syndrome, lumbar region    Postlaminectomy syndrome, thoracic region    Stroke (HCC)    BP 110/74    Pulse 71    Ht 5\' 3"  (1.6 m)    Wt 198 lb (89.8 kg)    SpO2 90%    BMI 35.07 kg/m   Opioid Risk Score:   Fall Risk Score:  `1  Depression screen PHQ 2/9  Depression screen [facility] 2/9 12/29/2020 10/20/2020 09/22/2020 03/17/2020 07/22/2019 03/09/2019 06/09/2018  Decreased Interest 0 0 0 0 0 0 1  Down, Depressed, Hopeless 0 0 0 0 0 0 1  PHQ - 2 Score 0 0 0 0 0 0 2  Altered sleeping - 0 - - - - -  Tired, decreased energy - 0 - - - - -  Change in appetite - 0 - - - - -  Feeling bad or failure about yourself  - 0 - - - - -  Trouble concentrating - 0 - - - - -  Moving slowly or fidgety/restless - 0 - - - - -  Suicidal thoughts - 0 - - - - -  PHQ-9 Score - 0 - - - - -  Difficult doing work/chores - Not difficult at all - - - - -  Some recent data might be hidden    Review of Systems  Musculoskeletal:  Positive for back pain.       Hip pain  All other systems reviewed and are negative.     Objective:   Physical Exam Vitals and  nursing note reviewed.  Constitutional:      Appearance: Normal appearance.  Cardiovascular:     Rate and Rhythm: Normal rate and regular rhythm.     Pulses: Normal pulses.     Heart sounds: Normal heart sounds.  Pulmonary:     Effort: Pulmonary effort is normal.     Breath sounds: Normal breath sounds.  Musculoskeletal:     Cervical  back: Normal range of motion and neck supple.     Comments: Normal Muscle Bulk and Muscle Testing Reveals:  Upper Extremities: Full ROM and Muscle Strength  5/5 Thoracic Paraspinal Tenderness: T-7-T-9  Lumbar Hypersensitivity Bilateral Greater Trochanter Tenderness Lower Extremities: Full ROM and Muscle Strength 5/5 Arises from Table slowly using walking Stick  Antalgic Gait     Skin:    General: Skin is warm and dry.  Neurological:     Mental Status: She is alert and oriented to person, place, and time.  Psychiatric:        Mood and Affect: Mood normal.        Behavior: Behavior normal.         Assessment & Plan:  1.Cervicalgia:Cervical Radiculitis Continue HEP as Tolerated. Continue to Monitor. 03/09/2021 2. Rotator Cuff Syndrome of Both Shoulders: Continue HEP as Tolerated. Continue to Monitor. 03/09/2021 3. 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 Continue HEPO as Tolerated. Continue to Monitor. 03/09/2021 Refilled:Tylenol #4 one tablet 4 times a day as needed #120 We will continue the opioid monitoring program, this consists of regular clinic visits, examinations, urine drug screen, pill counts as well as use of [place] Controlled Substance Reporting system. A 12 month History has been reviewed on the [facility] on 03/09/2021. 4. Lumbosacral Spondylosis: Scheduled for RightL5 dorsal ramus., Right L4 and Right L3 medial branch radio frequency neurotomy under fluoroscopic guidance  Her last procedure was performed on 10/01/2019,she had good relief with the above procedure.   5. Lumbar Radiculitis: Continue current medication regimen with Gabapentin. Continue to monitor. 03/09/2021 6. Muscle Spasm: Continue current medication regimen with Methocarbamol.  03/09/2021. 7. Chronic Bilateral Knee Pain: Continue HEP as Tolerated. Continue current medication regimen. Continue to Monitor. 03/09/2021

## 2021-03-12 ENCOUNTER — Telehealth: Payer: Self-pay | Admitting: Registered Nurse

## 2021-03-12 ENCOUNTER — Encounter: Payer: Self-pay | Admitting: Registered Nurse

## 2021-03-12 NOTE — Telephone Encounter (Signed)
Placed a call to Ms. Renea Ee, Ms. Caffee is requesting Back injection for increase intensity of lower back pain. Dr Letta Pate agrees with injection and Ms. Rothschild verbalizes understanding.

## 2021-03-12 NOTE — Telephone Encounter (Signed)
Dr Letta Pate,  I seen Emily Peck on 03/09/2021, she is requesting injection, she is having increase intensity of lower back pain. She is scheduled for Radiofrequency, her last one was 10/01/2019. Can you please review my note, to see if you are in agreement with the procedure.

## 2021-03-12 NOTE — Telephone Encounter (Signed)
Placed a call to Ms. Adams,  No Answer, left message to return the call.

## 2021-03-12 NOTE — Telephone Encounter (Signed)
Dr Letta Pate,  When I seen her she stated increase back pain, I will call her today and ask again.  Thank You

## 2021-03-14 LAB — DRUG TOX MONITOR 1 W/CONF, ORAL FLD
Amphetamines: NEGATIVE ng/mL (ref <10)
Barbiturates: NEGATIVE ng/mL (ref <10)
Benzodiazepines: NEGATIVE ng/mL (ref <0.50)
Buprenorphine: NEGATIVE ng/mL (ref <0.10)
Cocaine: NEGATIVE ng/mL (ref <5.0)
Codeine: 67.9 ng/mL — ABNORMAL HIGH (ref <2.5)
Dihydrocodeine: NEGATIVE ng/mL (ref <2.5)
Fentanyl: NEGATIVE ng/mL (ref <0.10)
Heroin Metabolite: NEGATIVE ng/mL (ref <1.0)
Hydrocodone: NEGATIVE ng/mL (ref <2.5)
Hydromorphone: NEGATIVE ng/mL (ref <2.5)
MARIJUANA: NEGATIVE ng/mL (ref <2.5)
MDMA: NEGATIVE ng/mL (ref <10)
Meprobamate: NEGATIVE ng/mL (ref <2.5)
Methadone: NEGATIVE ng/mL (ref <5.0)
Morphine: NEGATIVE ng/mL (ref <2.5)
Nicotine Metabolite: NEGATIVE ng/mL (ref <5.0)
Norhydrocodone: NEGATIVE ng/mL (ref <2.5)
Noroxycodone: NEGATIVE ng/mL (ref <2.5)
Opiates: POSITIVE ng/mL — AB (ref <2.5)
Oxycodone: NEGATIVE ng/mL (ref <2.5)
Oxymorphone: NEGATIVE ng/mL (ref <2.5)
Phencyclidine: NEGATIVE ng/mL (ref <10)
Tapentadol: NEGATIVE ng/mL (ref <5.0)
Tramadol: NEGATIVE ng/mL (ref <5.0)
Zolpidem: NEGATIVE ng/mL (ref <5.0)

## 2021-03-14 LAB — DRUG TOX ALC METAB W/CON, ORAL FLD: Alcohol Metabolite: NEGATIVE ng/mL (ref <25)

## 2021-03-22 ENCOUNTER — Telehealth: Payer: Self-pay | Admitting: *Deleted

## 2021-03-22 NOTE — Telephone Encounter (Signed)
Oral swab drug screen was consistent for prescribed medications.  ?

## 2021-03-29 ENCOUNTER — Ambulatory Visit: Payer: Medicare Other | Admitting: Family Medicine

## 2021-04-27 ENCOUNTER — Other Ambulatory Visit: Payer: Self-pay

## 2021-04-27 ENCOUNTER — Encounter: Payer: Medicare HMO | Attending: Registered Nurse | Admitting: Physical Medicine & Rehabilitation

## 2021-04-27 ENCOUNTER — Encounter: Payer: Self-pay | Admitting: Physical Medicine & Rehabilitation

## 2021-04-27 VITALS — BP 101/67 | HR 76 | Temp 98.0°F | Ht 63.0 in | Wt 201.4 lb

## 2021-04-27 DIAGNOSIS — M47817 Spondylosis without myelopathy or radiculopathy, lumbosacral region: Secondary | ICD-10-CM | POA: Diagnosis present

## 2021-04-27 NOTE — Patient Instructions (Signed)
You had a radio frequency procedure today This was done to alleviate joint pain in your lumbar area We injected lidocaine which is a local anesthetic.  You may experience soreness at the injection sites. You may also experienced some irritation of the nerves that were heated I'm recommending ice for 30 minutes every 2 hours as needed for the next 24-48 hours   

## 2021-04-27 NOTE — Progress Notes (Signed)
°  PROCEDURE RECORD [facility]   Name: Emily Peck DOB:1956/01/11 MRN: [id number]  Date:04/27/2021  Physician: Alysia Penna, MD    Nurse/CMA: Jorja Loa MA  Allergies:  Allergies  Allergen Reactions   Codeine     Other reaction(s): itching Other reaction(s): itching   No Known Allergies     Consent Signed: Yes.    Is patient diabetic? No.  CBG today? .  Pregnant: No. LMP: No LMP recorded. Patient has had a hysterectomy. (age 74-55)  Anticoagulants: yes (Warfarin) Anti-inflammatory: no Antibiotics: no  Procedure: Right L5,4,3 Radiofrequency  Position: Prone Start Time: 11:09 AM  End Time: 11:30 AM  Fluoro Time: 1:00  RN/CMA Jones, RMA Blinda Turek MA    Time 10:21 am 11:34 AM    BP 101/67 130/84     Pulse 76 72    Respirations 16 16    O2 Sat 96 98    S/S 6 6    Pain Level 9/10 4/10     D/C home with [name], patient A & O X 3, D/C instructions reviewed, and sits independently.

## 2021-04-27 NOTE — Progress Notes (Signed)
Left L5 dorsal ramus., left L4 and left L3 medial branch radio frequency neurotomy under fluoroscopic guidance  Indication: Low back pain due to lumbar spondylosis which has been relieved on 2 occasions by greater than 50% by lumbar medial branch blocks at corresponding levels.  Informed consent was obtained after describing risks and benefits of the procedure with the patient, this includes bleeding, bruising, infection, paralysis and medication side effects. The patient wishes to proceed and has given written consent. The patient was placed in a prone position. The lumbar and sacral area was marked and prepped with Betadine. A 25-gauge 1-1/2 inch needle was inserted into the skin and subcutaneous tissue at 3 sites in one ML of 1% lidocaine was injected into each site. Then a 18-gauge 10 cm radio frequency needle (would consider 15cm for subsequent, since needle buried to hub) with a 1 cm curved active tip was inserted targeting the left S1 SAP/sacral ala junction. Bone contact was made and confirmed with lateral imaging.  motor stimulation at 2 Hz confirm proper needle location followed by injection of one ML of 1% MPF lidocaine. Then the left L5 SAP/transverse process junction was targeted. Bone contact was made and confirmed with lateral imaging motor stimulation at 2 Hz confirm proper needle location followed by injection of one ML of the solution containing one ML of  1% MPF lidocaine. Then the left L4 SAP/transverse process junction was targeted. Bone contact was made and confirmed with lateral imaging. motor stimulation at 2 Hz confirm proper needle location followed by injection of one ML of the solution containing one ML of1% MPF lidocaine. Radio frequency lesion  at [facility] for 90 seconds was performed. Needles were removed. Post procedure instructions and vital signs were performed. Patient tolerated procedure well. Followup appointment was given.

## 2021-05-14 ENCOUNTER — Telehealth: Payer: Self-pay

## 2021-05-14 NOTE — Telephone Encounter (Signed)
Patient called stating that is in a lot of pain, she can barely walk. States had RF done recently and still in pain. Takes Tylenol #4 but not working. ?

## 2021-05-15 MED ORDER — METHOCARBAMOL 500 MG PO TABS: 500.0000 mg | ORAL_TABLET | Freq: Three times a day (TID) | ORAL | 0 refills | Status: DC | PRN

## 2021-05-16 ENCOUNTER — Telehealth: Payer: Self-pay | Admitting: *Deleted

## 2021-05-16 NOTE — Telephone Encounter (Signed)
Received a call from Emily Mare NP [facility]. Emily Peck called her today and her complaint is that her pain medication is causing her itching  ( has documented allergy to codeine) and it is not helping her back pain. She reports the RF did nothing for her back. Please advise. ?

## 2021-05-16 NOTE — Telephone Encounter (Signed)
Emily Peck called back saying she can't get out of bed. Per Dr Letta Pate she "needs to go to ED, not sure what is going on, may have new issue". I spoke with her and she said ok. ?

## 2021-05-17 MED ORDER — TRAMADOL HCL 50 MG PO TABS: 50.0000 mg | ORAL_TABLET | Freq: Four times a day (QID) | ORAL | 0 refills | Status: DC | PRN

## 2021-05-18 ENCOUNTER — Encounter: Payer: Self-pay | Admitting: *Deleted

## 2021-05-18 NOTE — Telephone Encounter (Signed)
Emily Peck called back and says that Tramadol did not help her AT ALL, when she was put on it before.  She is making her appt to follow up with Dr Letta Pate, but does not want the tramadol. ?

## 2021-05-18 NOTE — Telephone Encounter (Signed)
Opened in error

## 2021-05-29 ENCOUNTER — Encounter: Payer: Medicare HMO | Attending: Registered Nurse | Admitting: Physical Medicine & Rehabilitation

## 2021-05-29 ENCOUNTER — Other Ambulatory Visit: Payer: Self-pay

## 2021-05-29 ENCOUNTER — Encounter: Payer: Self-pay | Admitting: Physical Medicine & Rehabilitation

## 2021-05-29 VITALS — BP 131/84 | HR 73 | Ht 63.0 in | Wt 204.0 lb

## 2021-05-29 DIAGNOSIS — M542 Cervicalgia: Secondary | ICD-10-CM | POA: Diagnosis present

## 2021-05-29 DIAGNOSIS — M47817 Spondylosis without myelopathy or radiculopathy, lumbosacral region: Secondary | ICD-10-CM | POA: Diagnosis present

## 2021-05-29 MED ORDER — HYDROCODONE-ACETAMINOPHEN 5-325 MG PO TABS: 1.0000 | ORAL_TABLET | Freq: Two times a day (BID) | ORAL | 0 refills | Status: DC | PRN

## 2021-05-29 NOTE — Progress Notes (Signed)
? ?Subjective:  ? ? Patient ID: Emily Peck, female    DOB: 11/22/1955, 66 y.o.   MRN: [id number] ? ?HPI ?CC:  Acute exacerbation of chronic low back pain  ?04/27/21- Left L5 dorsal ramus., left L4 and left L3 medial branch radio frequency neurotomy under fluoroscopic guidance ?Had to quit work due to back pain .  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 ?In the last 24 hours, has pain interfered with the following? ?General activity 2 ?Relation with others 2 ?Enjoyment of life 2 ?What TIME of day is your pain at its worst? morning , daytime, and evening ?Sleep (in general) Fair ? ?Pain is worse with: walking, sitting, and standing ?Pain improves with: medication ?Relief from Meds: 1 ? ?Family History  ?Problem Relation Age of Onset  ? Cancer Mother   ? Diabetes Mother   ? ?Social History  ? ?Socioeconomic History  ? Marital status: Single  ?  Spouse name: Not on file  ? Number of children: 2  ? Years of education: Not on file  ? Highest education level: Not on file  ?Occupational History  ? Not on file  ?Tobacco Use  ? Smoking status: Never  ? Smokeless tobacco: Never  ?Vaping Use  ? Vaping Use: Never used  ?Substance and Sexual Activity  ? Alcohol use: No  ? Drug use: No  ? Sexual activity: Not on file  ?Other Topics Concern  ? Not on file  ?Social History Narrative  ? Not on file  ? ?Social Determinants of Health  ? ?Financial Resource Strain: Not on file  ?Food Insecurity: Not on file  ?Transportation Needs: Not on file  ?Physical Activity: Not on file  ?Stress: Not on file  ?Social Connections: Not on file  ? ?Past Surgical History:  ?Procedure Laterality Date  ? ABDOMINAL HYSTERECTOMY    ? BACK SURGERY    ? BIOPSY  10/02/2020  ? Procedure: BIOPSY;  Surgeon: Juanita Craver, MD;  Location: [place];  Service: Endoscopy;;  ? COLONOSCOPY WITH PROPOFOL N/A 10/02/2020  ? Procedure:  COLONOSCOPY WITH PROPOFOL;  Surgeon: Juanita Craver, MD;  Location: [facility] ENDOSCOPY;  Service: Endoscopy;  Laterality: N/A;  ? ESOPHAGOGASTRODUODENOSCOPY (EGD) WITH PROPOFOL N/A 10/02/2020  ? Procedure: ESOPHAGOGASTRODUODENOSCOPY (EGD) WITH PROPOFOL;  Surgeon: Juanita Craver, MD;  Location: [facility] ENDOSCOPY;  Service: Endoscopy;  Laterality: N/A;  ? EYE SURGERY    ? eye removed right   ? GIVENS CAPSULE STUDY N/A 10/02/2020  ? Procedure: GIVENS CAPSULE STUDY;  Surgeon: Juanita Craver, MD;  Location: [facility] ENDOSCOPY;  Service: Endoscopy;  Laterality: N/A;  ? KNEE ARTHROSCOPY    ? 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  ? Procedure: TOTAL KNEE ARTHROPLASTY;  Surgeon: Melrose Nakayama, MD;  Location: [place];  Service: Orthopedics;  Laterality: Right;  ? ?Past Surgical History:  ?Procedure Laterality Date  ? ABDOMINAL HYSTERECTOMY    ? BACK SURGERY    ? BIOPSY  10/02/2020  ? Procedure: BIOPSY;  Surgeon: Juanita Craver, MD;  Location: [place];  Service: Endoscopy;;  ? COLONOSCOPY WITH PROPOFOL N/A 10/02/2020  ? Procedure: COLONOSCOPY WITH PROPOFOL;  Surgeon: Juanita Craver, MD;  Location: [facility] ENDOSCOPY;  Service: Endoscopy;  Laterality: N/A;  ? ESOPHAGOGASTRODUODENOSCOPY (EGD) WITH PROPOFOL N/A 10/02/2020  ? Procedure: ESOPHAGOGASTRODUODENOSCOPY (EGD) WITH PROPOFOL;  Surgeon: Juanita Craver, MD;  Location: [facility] ENDOSCOPY;  Service: Endoscopy;  Laterality: N/A;  ?  EYE SURGERY    ? eye removed right   ? GIVENS CAPSULE STUDY N/A 10/02/2020  ? Procedure: GIVENS CAPSULE STUDY;  Surgeon: Juanita Craver, MD;  Location: [facility] ENDOSCOPY;  Service: Endoscopy;  Laterality: N/A;  ? KNEE ARTHROSCOPY    ? 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  ? Procedure: TOTAL KNEE ARTHROPLASTY;  Surgeon: Melrose Nakayama, MD;  Location: [place];  Service: Orthopedics;  Laterality: Right;  ? ?Past Medical History:  ?Diagnosis Date  ? Anxiety   ? Clotting disorder ([place])   ? Depression   ? Dyspnea   ? GERD (gastroesophageal reflux disease)   ? Headache   ? Lumbosacral spondylosis without  myelopathy   ? Neuropathy   ? Postlaminectomy syndrome, lumbar region   ? Postlaminectomy syndrome, thoracic region   ? Stroke [facility])   ? ?There were no vitals taken for this visit. ? ?Opioid Risk Score:   ?Fall Risk Score:  `1 ? ?Depression screen PHQ 2/9 ? ? ?  12/29/2020  ?  8:58 AM 10/20/2020  ?  9:58 AM 09/22/2020  ?  8:37 AM 03/17/2020  ?  9:21 AM 07/22/2019  ?  1:33 PM 03/09/2019  ?  1:43 PM 06/09/2018  ?  2:01 PM  ?Depression screen PHQ 2/9  ?Decreased Interest 0 0 0 0 0 0 1  ?Down, Depressed, Hopeless 0 0 0 0 0 0 1  ?PHQ - 2 Score 0 0 0 0 0 0 2  ?Altered sleeping  0       ?Tired, decreased energy  0       ?Change in appetite  0       ?Feeling bad or failure about yourself   0       ?Trouble concentrating  0       ?Moving slowly or fidgety/restless  0       ?Suicidal thoughts  0       ?PHQ-9 Score  0       ?Difficult doing work/chores  Not difficult at all       ?  ? ?Review of Systems  ?Musculoskeletal:  Positive for back pain.  ?     Bilateral hip pain  ?All other systems reviewed and are negative. ? ?   ?Objective:  ? Physical Exam ?Tenderness to palpation bilateral upper trap and right levator scapula right cervical paraspinal adjacent to C7 ?She has full range of motion in the cervical spine although extension is slightly painful. 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 ? ? ?   ?Assessment & Plan:  ?1.  Chronic multifactorial pain.  She does have some significant lumbar spondylosis but no signs of radiculopathy or myelopathy.  She did not get her usual degree of relief  following radiofrequency neurotomy performed on left L 3 L4-L5 medial branches.  Possibly technical given some weight gain she may need a longer needle electrode.  This can be repeated at the end of August. ?We discussed that her last MRI was 2010 and she has had pain prior to her repeat RF and has persisted afterwards so that she has had greater than 6  weeks unrelenting discomfort which has limited her movement and actually go into retirement.  We will recheck MRI of the lumbar spine ? ?2.  Neck pain question myofascial versus fibromyalgia she does have multiple tender areas in typical trigger point distribution, certainly may have cervical spondylosis as well we will check x-rays ?Trigger Point Injection ? ?Indication: Cervical myofascial pain not relieved by medication management and other conservative care. ? ?Informed consent was obtained after describing risk and benefits of the procedure with the patient, this includes bleeding, bruising, infection and medication side effects.  The patient wishes to proceed and has given written consent.  The patient was placed in a seated position.  The bilateral upper trapezius bilateral levator scapula right C7 paraspinal area was marked and prepped with Betadine.  It was entered with a 25-gauge 1-1/2 inch needle and 1 mL of 1% lidocaine was injected into each of 5 trigger points, after negative draw back for blood.  The patient tolerated the procedure well.  Post procedure instructions were given. ? ?3.  Fibromyalgia syndrome we discussed that aquatic therapy would be very beneficial and she states that she would start this when she is done working in the next couple weeks. ?In terms of medication management this is limited by her other medications, she is on multiple serotonergic agents i.e. Lexapro and trazodone so duloxetine may cause excessive serotonin, serotonin syndrome. ?She is on very high-dose gabapentin which can be used in fibromyalgia but does not seem to be  benefiting patient, she may be a candidate for or pregabalin but she would need to be weaned off the gabapentin.  The patient states that she did not want to try pregabalin due to potential for blurring of visi

## 2021-05-29 NOTE — Patient Instructions (Signed)

## 2021-06-05 ENCOUNTER — Ambulatory Visit: Payer: Medicare HMO | Admitting: Physical Medicine & Rehabilitation

## 2021-06-09 ENCOUNTER — Ambulatory Visit (HOSPITAL_COMMUNITY): Admission: RE | Admit: 2021-06-09 | Payer: Medicare HMO | Source: Ambulatory Visit

## 2021-06-22 ENCOUNTER — Ambulatory Visit: Payer: Medicare HMO | Admitting: Physical Medicine & Rehabilitation

## 2021-06-28 ENCOUNTER — Telehealth: Payer: Self-pay

## 2021-06-28 NOTE — Telephone Encounter (Signed)
Patient is calling to request a refill on her Hydrocodone. She has an appointment set for 07/17/21. Per PMP, last fill was 05/29/21 ?

## 2021-06-29 MED ORDER — HYDROCODONE-ACETAMINOPHEN 5-325 MG PO TABS: 1.0000 | ORAL_TABLET | Freq: Two times a day (BID) | ORAL | 0 refills | Status: DC | PRN

## 2021-06-29 NOTE — Addendum Note (Signed)
Addended by: Charlett Blake on: 06/29/2021 09:12 AM ? ? Modules accepted: Orders ? ?

## 2021-07-02 ENCOUNTER — Ambulatory Visit (HOSPITAL_COMMUNITY)
Admission: RE | Admit: 2021-07-02 | Discharge: 2021-07-02 | Disposition: A | Discharge status: Home / Self Care | Payer: Medicare HMO | Source: Ambulatory Visit | Attending: Physical Medicine & Rehabilitation | Admitting: Physical Medicine & Rehabilitation

## 2021-07-02 DIAGNOSIS — M47817 Spondylosis without myelopathy or radiculopathy, lumbosacral region: Secondary | ICD-10-CM | POA: Insufficient documentation

## 2021-07-17 ENCOUNTER — Encounter: Payer: Self-pay | Admitting: Physical Medicine & Rehabilitation

## 2021-07-17 ENCOUNTER — Encounter: Payer: Medicare HMO | Attending: Registered Nurse | Admitting: Physical Medicine & Rehabilitation

## 2021-07-17 VITALS — BP 98/67 | HR 80 | Ht 63.0 in | Wt 199.6 lb

## 2021-07-17 DIAGNOSIS — M47817 Spondylosis without myelopathy or radiculopathy, lumbosacral region: Secondary | ICD-10-CM | POA: Insufficient documentation

## 2021-07-17 NOTE — Progress Notes (Signed)
? ?Subjective:  ? ? Patient ID: Emily Peck, female    DOB: October 01, 1955, 66 y.o.   MRN: [id number] ? 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  Although not noting and reports there is evidence of T12-L1 instrumented fusion.  Remainder of study as reported by radiologist.  New order finding of L2-3 stenosis but this is mild she does not have any hip or thigh pain at this new.  Would expect some on the left side only given radiologic findings. ?Pt switched from T#4 to Hydrocodone '5mg'$  BID ? ?Pain Inventory ?Average Pain 4 ?Pain Right Now 6 ?My pain is intermittent, sharp, and stabbing ? ?In the last 24 hours, has pain interfered with the following? ?General activity 6 ?Relation with others 6 ?Enjoyment of life 6 ?What TIME of day is your pain at its worst? varies ?Sleep (in general) Good ? ?Pain is worse with: walking, bending, and standing ?Pain improves with: medication ?Relief from Meds: 5 ? ?Family History  ?Problem Relation Age of Onset  ? Cancer Mother   ? Diabetes Mother   ? ?Social History  ? ?Socioeconomic History  ? Marital status: Single  ?  Spouse name: Not on file  ? Number of children: 2  ? Years of education: Not on file  ? Highest education level: Not on file  ?Occupational History  ? Not on file  ?Tobacco Use  ? Smoking status: Never  ? Smokeless tobacco: Never  ?Vaping Use  ? Vaping Use: Never used  ?Substance and Sexual Activity  ? Alcohol use: No  ? Drug use: No  ? Sexual activity: Not on file  ?Other Topics Concern  ? Not on file  ?Social History Narrative  ? Not on file  ? ?Social Determinants of Health  ? ?Financial Resource Strain: Not on file  ?Food Insecurity: Not on file  ?Transportation Needs: Not on  file  ?Physical Activity: Not on file  ?Stress: Not on file  ?Social Connections: Not on file  ? ?Past Surgical History:  ?Procedure Laterality Date  ? ABDOMINAL HYSTERECTOMY    ? BACK SURGERY    ? BIOPSY  10/02/2020  ? Procedure: BIOPSY;  Surgeon: Juanita Craver, MD;  Location: [place];  Service: Endoscopy;;  ? COLONOSCOPY WITH PROPOFOL N/A 10/02/2020  ? Procedure: COLONOSCOPY WITH PROPOFOL;  Surgeon: Juanita Craver, MD;  Location: [facility] ENDOSCOPY;  Service: Endoscopy;  Laterality: N/A;  ? ESOPHAGOGASTRODUODENOSCOPY (EGD) WITH PROPOFOL N/A 10/02/2020  ? Procedure: ESOPHAGOGASTRODUODENOSCOPY (EGD) WITH PROPOFOL;  Surgeon: Juanita Craver, MD;  Location: [facility] ENDOSCOPY;  Service: Endoscopy;  Laterality: N/A;  ? EYE SURGERY    ? eye removed right   ? GIVENS CAPSULE STUDY N/A 10/02/2020  ? Procedure: GIVENS CAPSULE STUDY;  Surgeon: Juanita Craver, MD;  Location: [facility] ENDOSCOPY;  Service: Endoscopy;  Laterality: N/A;  ? KNEE ARTHROSCOPY    ? 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  ? Procedure: TOTAL KNEE ARTHROPLASTY;  Surgeon: Melrose Nakayama, MD;  Location: [place];  Service: Orthopedics;  Laterality: Right;  ? ?Past Surgical History:  ?Procedure Laterality Date  ? ABDOMINAL HYSTERECTOMY    ? BACK SURGERY    ? BIOPSY  10/02/2020  ? Procedure: BIOPSY;  Surgeon: Juanita Craver, MD;  Location:  [facility] ENDOSCOPY;  Service: Endoscopy;;  ? COLONOSCOPY WITH PROPOFOL N/A 10/02/2020  ? Procedure: COLONOSCOPY WITH PROPOFOL;  Surgeon: Juanita Craver, MD;  Location: [facility] ENDOSCOPY;  Service: Endoscopy;  Laterality: N/A;  ? ESOPHAGOGASTRODUODENOSCOPY (EGD) WITH PROPOFOL N/A 10/02/2020  ? Procedure: ESOPHAGOGASTRODUODENOSCOPY (EGD) WITH PROPOFOL;  Surgeon: Juanita Craver, MD;  Location: [facility] ENDOSCOPY;  Service: Endoscopy;  Laterality: N/A;  ? EYE SURGERY    ? eye removed right   ? GIVENS CAPSULE STUDY N/A 10/02/2020  ? Procedure: GIVENS CAPSULE STUDY;  Surgeon: Juanita Craver, MD;  Location: [facility] ENDOSCOPY;  Service: Endoscopy;  Laterality: N/A;  ? KNEE ARTHROSCOPY    ? TOTAL  KNEE ARTHROPLASTY Right 01/09/2016  ? Procedure: TOTAL KNEE ARTHROPLASTY;  Surgeon: Melrose Nakayama, MD;  Location: [place];  Service: Orthopedics;  Laterality: Right;  ? ?Past Medical History:  ?Diagnosis Date  ? Anxiety   ? Clotting disorder ([place])   ? Depression   ? Dyspnea   ? GERD (gastroesophageal reflux disease)   ? Headache   ? Lumbosacral spondylosis without myelopathy   ? Neuropathy   ? Postlaminectomy syndrome, lumbar region   ? Postlaminectomy syndrome, thoracic region   ? Stroke [facility])   ? ?BP 98/67   Pulse 80   Ht '5\' 3"'$  (1.6 m)   Wt 199 lb 9.6 oz (90.5 kg)   SpO2 97%   BMI 35.36 kg/m?  ? ?Opioid Risk Score:   ?Fall Risk Score:  `1 ? ?Depression screen PHQ 2/9 ? ? ?  05/29/2021  ?  2:35 PM 12/29/2020  ?  8:58 AM 10/20/2020  ?  9:58 AM 09/22/2020  ?  8:37 AM 03/17/2020  ?  9:21 AM 07/22/2019  ?  1:33 PM 03/09/2019  ?  1:43 PM  ?Depression screen PHQ 2/9  ?Decreased Interest 2 0 0 0 0 0 0  ?Down, Depressed, Hopeless 2 0 0 0 0 0 0  ?PHQ - 2 Score 4 0 0 0 0 0 0  ?Altered sleeping   0      ?Tired, decreased energy   0      ?Change in appetite   0      ?Feeling bad or failure about yourself    0      ?Trouble concentrating   0      ?Moving slowly or fidgety/restless   0      ?Suicidal thoughts   0      ?PHQ-9 Score   0      ?Difficult doing work/chores   Not difficult at all      ?  ? ?Review of Systems  ?Musculoskeletal:  Positive for back pain.  ?     Bilateral hip pain  ?All other systems reviewed and are negative. ? ?   ?Objective:  ? Physical Exam ?Vitals and nursing note reviewed.  ?Constitutional:   ?   Appearance: She is obese.  ?HENT:  ?   Head: Normocephalic and atraumatic.  ?Musculoskeletal:  ?   Comments: Pain to palpation lumbar paraspinal area right greater than left side ?Pain with lumbar extension she can get to neutral ?Forward flexion is 50% accompanied by some improvement in back pain.  ?Skin: ?   General: Skin is warm and dry.  ?Neurological:  ?   Mental Status: She is alert and oriented to  person, place, and time.  ?   Comments: Negative straight leg raising bilaterally ?Motor strength is 5/5 bilateral hip flexor knee extensor ankle dorsiflexor ?Sensation intact light  touch bilateral lower extremities  ?Psychiatric:     ?   Mood and Affect: Mood normal.     ?   Behavior: Behavior normal.  ? ? ? ? ? ?   ?Assessment & Plan:  ? ?1.  Lumbar spondylosis without myelopathy did not get usual degree of relief after radiofrequency neurotomy last treatment several months ago.  Change medications from Tylenol with codeine to hydrocodone 5 mg twice daily with some improvement in pain.  She has modified her activity level and she is walking less now. ?Reviewed MRI of the lumbar spine did not show any significant new findings.  As expected significant lumbar facet arthropathy worst at L4-5.  The newer finding of stenosis at L2-3 does not correlate well with her symptoms or physical exam findings.  It is also mild and likely not causing any symptomatology. ?We discussed that she has limited range of motion lumbar spine and this is likely adding to her pain.  Recommend aquatic physical therapy.  Have made referral ?Would like to see her back in around 6 weeks ?

## 2021-07-27 ENCOUNTER — Telehealth: Payer: Self-pay | Admitting: Physical Medicine & Rehabilitation

## 2021-07-27 MED ORDER — HYDROCODONE-ACETAMINOPHEN 5-325 MG PO TABS: 1.0000 | ORAL_TABLET | Freq: Two times a day (BID) | ORAL | 0 refills | Status: DC | PRN

## 2021-07-27 NOTE — Telephone Encounter (Signed)
Patient called back inquiring if her medication had been called in

## 2021-07-27 NOTE — Telephone Encounter (Signed)
Sent in at 12:38 pm today.

## 2021-07-27 NOTE — Telephone Encounter (Signed)
Patient is asking for her Hydrocodone to be sent to pharmacy.

## 2021-07-31 ENCOUNTER — Encounter: Payer: Self-pay | Admitting: Family Medicine

## 2021-07-31 ENCOUNTER — Ambulatory Visit (INDEPENDENT_AMBULATORY_CARE_PROVIDER_SITE_OTHER): Payer: Medicare HMO | Admitting: Family Medicine

## 2021-07-31 VITALS — BP 104/67 | HR 83 | Ht 63.0 in | Wt 200.0 lb

## 2021-07-31 DIAGNOSIS — G629 Polyneuropathy, unspecified: Secondary | ICD-10-CM

## 2021-07-31 MED ORDER — GABAPENTIN 600 MG PO TABS: 1200.0000 mg | ORAL_TABLET | Freq: Three times a day (TID) | ORAL | 3 refills | Status: DC

## 2021-07-31 NOTE — Progress Notes (Signed)
PATIENT: Emily Peck DOB: 10-Oct-1955  REASON FOR VISIT: follow up HISTORY FROM: patient  Chief Complaint  Patient presents with   Follow-up    Rm 10, alone. Here for yearly f/u. Pt c/o of burning and swelling in legs. Last month pt has had shooting pn in neck, using heating pad.     HISTORY OF PRESENT ILLNESS:  07/31/21 ALL: Amanat returns for follow up for peripheral neuropathy. Emily Peck continues gabapentin '1200mg'$  TID. Emily Peck was last seen 03/2020 and we attempted to get a topical compounded neuropathy cream covered. Emily Peck was unable to afford. Emily Peck talked to a rep about Qutenza but wasn't sure Emily Peck could afford. Emily Peck continues to have waxing and waning burning sensation of both feet. Dorsal area of foot bothers her more, recently. Emily Peck also reports swelling of her feet over the past year. Emily Peck is taking Lasix. Compression stockings rarely used as they tend to increase neuropathy pain. Emily Peck is followed closely by Dr Letta Pate with pain management.   03/29/2020 ALL:  Emily Peck returns today for follow up for peripheral neuropathy on gabapentin. Emily Peck continues '1200mg'$  TID. We ordered a compounded neuropathy cream at last visit but Emily Peck reports that Emily Peck never heard back about this. I will resend orders today. Pain continues. Her feet burn all the time. Emily Peck feels it is most bothersome at night. Emily Peck has significant pain in the lower legs and they are very tender to touch. They stay red. Emily Peck is followed by pain management on Tylenol $ but it does not help at all. Emily Peck has consistent low back pain. Emily Peck tries to be active but hurts all the time. Emily Peck continues to have falls regularly. Emily Peck feels it is related to not being able to see. Emily Peck does use a cane at all times. Emily Peck is able to navigate public transportation.   Emily Peck was seen by Dr Brett Fairy in 04/2019 for excessive daytime sleepiness. Sleep study was normal with no evidence of sleep disordered breathing. 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 Emily Peck  was encouraged to focus on sleep hygiene. Melatonin recommended. Emily Peck has not noted any significant changes but admits that sleep schedule is not consistent.    03/30/2019 ALL: Emily Peck is a 66 y.o. female here today for follow up for peripheral neuropathy. Emily Peck continues to have burning pain in bilateral lower extremities. Emily Peck is taking gabapentin '1200mg'$  three times daily. Emily Peck does feel that this help. Pain is better with rest, worse with activity. Emily Peck denies changes in her gait. Emily Peck does have a history of falls. Emily Peck contributes this to vision loss. 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 Emily Peck is followed closely by PCP. Emily Peck denies falls when Emily Peck uses her cane.    HISTORY: (copied from Harper Martin's note on 10/15/2017)  UPDATE 8/14/2019CM Emily Peck, 66 year old female returns for follow-up with polyneuropathy in her feet for years which has progressed over time.  It is a pins-and-needles sensation.  Emily Peck is currently on gabapentin with benefit.  Emily Peck continues to see Dr. Deanna Artis for chronic low back pain and lumbar medial branch blocks.  Emily Peck has a appointment in September.  Emily Peck claims the injections last about 6 months.  Emily Peck has had 3 falls since last seen however Emily Peck has a prosthetic eye on the right and decreased vision on the left.  Emily Peck has [facility] a specialist [facility] that has said nothing else can be done for her vision.  Emily Peck was encouraged to  be safe with her ambulation. Emily Peck ambulates with a single-point cane.  Emily Peck returns for reevaluation     UPDATE 05/04/2018CM Emily Peck, 66 year old female returns for follow-up with history of polyneuropathy in her feet for years which has progressively worsened over time. Emily Peck has a pins and needles burning in the feet, used to be only on the bottom of the feet balance on the top of the feet as well.Emily Peck had right knee replacement November 2017. Emily Peck is still in physical therapy. Emily Peck continues to see Dr.Kirsteins  for  chronic low back pain and receives lumbar medial branch blocks. Emily Peck has an appointment today. Emily Peck says the injections last about 6 months. Emily Peck continues to have lot of left leg pain due to her knee surgery. Emily Peck denies any falls. Emily Peck is ambulating with single-point cane. Emily Peck returns for reevaluation   HISTORY 10/19/15 Emily Peck is a 66 y.o. female here as a referral from Dr. Baird Cancer for neuropathy in the feet. PMHx of hyperlipidemia, chronic lumbar radiculopathy, polyneuropathy, obesity, thoracic compression fractures T10-T11 s/pT12-L1 fusion, depression, glaucoma, retinal detachment, macular degeneration and poor vision.. Emily Peck has had neuropathy in the feet for years. Progressively worsening, continuous. Pins and needles, burning in the feet, it used to be under the bottom now it on the top also. Emily Peck denies diabetes or using any medications that can cause neuropathy Emily Peck has never been exposed to toxins that can cause neuropathy. Mother was diabetic and had neuropathy. Associated ith cramps in the feet. The burning is the worst. Worse at night. The gabapentin helps. Emily Peck takes '600mg'$  4x a day. Continous all day. Unknown inciting events. No medications that can induce neuropathy. No alcohol use. Emily Peck takes '600mg'$  neurontin 4x a day.    REVIEW OF SYSTEMS: Out of a complete 14 system review of symptoms, the patient complains only of the following symptoms, neuropathy, chronic pain, vision loss and all other reviewed systems are negative.   ALLERGIES: Allergies  Allergen Reactions   Codeine     Other reaction(s): itching Other reaction(s): itching   No Known Allergies     HOME MEDICATIONS: Outpatient Medications Prior to Visit  Medication Sig Dispense Refill   Ascorbic Acid (VITAMIN C PO) Take 1 tablet by mouth daily.     B Complex Vitamins (VITAMIN B COMPLEX PO) Take 1 tablet by mouth daily.     Camphor-Eucalyptus-Menthol (VICKS VAPORUB EX) Apply topically. Uses on feet prn for pain  (family  dollar brand)     diazepam (VALIUM) 10 MG tablet diazepam 10 mg tablet  TAKE 1 TABLET BY MOUTH 30 MINS BEFORE PROCEDURE     diphenhydrAMINE (BENADRYL) 25 MG tablet Take 25-50 mg by mouth 4 (four) times daily as needed for itching. Takes with Tylenol #4 due to codeine allergy.     escitalopram (LEXAPRO) 20 MG tablet Take 20 mg by mouth at bedtime.   0   FEROSUL 325 (65 Fe) MG tablet Take 325 mg by mouth daily.     fluticasone (FLONASE) 50 MCG/ACT nasal spray Place 1 spray into both nostrils in the morning and at bedtime.     HYDROcodone-acetaminophen (NORCO) 5-325 MG tablet Take 1 tablet by mouth 2 (two) times daily as needed for moderate pain. 60 tablet 0   ibuprofen (ADVIL) 800 MG tablet ibuprofen 800 mg tablet  TAKE 1 TABLET BY MOUTH TWICE DAILY     ketoconazole (NIZORAL) 2 % cream ketoconazole 2 % topical cream  APPLY TWICE DAILY     Lido-Capsaicin-Men-Methyl Sal  0.5-0.035-5-20 % PTCH Place one patch on the bottom of each foot every night at bedtime 60 patch 11   methocarbamol (ROBAXIN) 500 MG tablet Take 1 tablet (500 mg total) by mouth every 8 (eight) hours as needed for muscle spasms. 45 tablet 0   neomycin-polymyxin-dexamethasone (MAXITROL) 0.1 % ophthalmic suspension neomycin-polymyxin-dexameth 3.5 mg/mL-10,000 unit/mL-0.1% eye drops  SHAKE LIQUID AND INSTILL 1 DROP IN RIGHT EYE TWICE DAILY     pantoprazole (PROTONIX) 40 MG tablet Take 1 tablet (40 mg total) by mouth 2 (two) times daily. 60 tablet 0   potassium chloride (KLOR-CON M) 10 MEQ tablet Take 10 mEq by mouth 1 day or 1 dose.     potassium chloride (KLOR-CON) 10 MEQ tablet Take 10 mEq by mouth daily.     Propylene Glycol 0.6 % SOLN Place 1 drop into both eyes as needed (dry eyes).     sodium chloride (OCEAN) 0.65 % SOLN nasal spray Place 1 spray into both nostrils as needed for congestion.     topiramate (TOPAMAX) 200 MG tablet Take 200 mg by mouth at bedtime.     traZODone (DESYREL) 150 MG tablet Take 150 mg by mouth at  bedtime.  0   VITAMIN D PO Take 1 tablet by mouth daily.     warfarin (COUMADIN) 6 MG tablet Take 6-9 mg by mouth daily. Take '9mg'$  Monday, Wednesday, and Fridays, then all other days take '6mg'$ s daily     gabapentin (NEURONTIN) 600 MG tablet Take 2 tablets (1,200 mg total) by mouth 3 (three) times daily. 540 tablet 3   enoxaparin (LOVENOX) 100 MG/ML injection Inject 1 syringe into the skin every 12 (twelve) hours for 5 days. 10 mL 0   furosemide (LASIX) 20 MG tablet Take 1 tablet (20 mg total) by mouth daily for 7 days. 30 tablet 0   No facility-administered medications prior to visit.    PAST MEDICAL HISTORY: Past Medical History:  Diagnosis Date   Anxiety    Clotting disorder ([place])    Depression    Dyspnea    GERD (gastroesophageal reflux disease)    Headache    Lumbosacral spondylosis without myelopathy    Neuropathy    Postlaminectomy syndrome, lumbar region    Postlaminectomy syndrome, thoracic region    Stroke [facility])     PAST SURGICAL HISTORY: Past Surgical History:  Procedure Laterality Date   ABDOMINAL HYSTERECTOMY     BACK SURGERY     BIOPSY  10/02/2020   Procedure: BIOPSY;  Surgeon: Juanita Craver, MD;  Location: [place];  Service: Endoscopy;;   COLONOSCOPY WITH PROPOFOL N/A 10/02/2020   Procedure: COLONOSCOPY WITH PROPOFOL;  Surgeon: Juanita Craver, MD;  Location: [place];  Service: Endoscopy;  Laterality: N/A;   ESOPHAGOGASTRODUODENOSCOPY (EGD) WITH PROPOFOL N/A 10/02/2020   Procedure: ESOPHAGOGASTRODUODENOSCOPY (EGD) WITH PROPOFOL;  Surgeon: Juanita Craver, MD;  Location: [facility] ENDOSCOPY;  Service: Endoscopy;  Laterality: N/A;   EYE SURGERY     eye removed right    GIVENS CAPSULE STUDY N/A 10/02/2020   Procedure: GIVENS CAPSULE STUDY;  Surgeon: Juanita Craver, MD;  Location[facility] ENDOSCOPY;  Service: Endoscopy;  Laterality: N/A;   KNEE ARTHROSCOPY     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   Procedure: TOTAL KNEE ARTHROPLASTY;  Surgeon: Melrose Nakayama, MD;  Location: [place];   Service: Orthopedics;  Laterality: Right;    FAMILY HISTORY: Family History  Problem Relation Age of Onset   Cancer Mother    Diabetes Mother     SOCIAL  HISTORY: Social History   Socioeconomic History   Marital status: Single    Spouse name: Not on file   Number of children: 2   Years of education: Not on file   Highest education level: Not on file  Occupational History   Not on file  Tobacco Use   Smoking status: Never   Smokeless tobacco: Never  Vaping Use   Vaping Use: Never used  Substance and Sexual Activity   Alcohol use: No   Drug use: No   Sexual activity: Not on file  Other Topics Concern   Not on file  Social History Narrative   Not on file   Social Determinants of Health   Financial Resource Strain: Not on file  Food Insecurity: Not on file  Transportation Needs: Not on file  Physical Activity: Not on file  Stress: Not on file  Social Connections: Not on file  Intimate Partner Violence: Not on file    PHYSICAL EXAM  Vitals:   07/31/21 1500  BP: 104/67  Pulse: 83  Weight: 200 lb (90.7 kg)  Height: '5\' 3"'$  (1.6 m)    Body mass index is 35.43 kg/m.  Generalized: Well developed, in no acute distress  Cardiology: normal rate and rhythm, no murmur noted Respiratory: clear to auscultation bilaterally  Neurological examination  Mentation: Alert oriented to time, place, history taking. Follows all commands speech and language fluent Cranial nerve II-XII: Pupils were equal round reactive to light. Left Extraocular movements were full, right prosthesis, peripheral field loss of left eye.  Motor: The motor testing reveals 4+ over 5 strength of all 4 extremities. Good symmetric motor tone is noted throughout.  Sensory: Sensory testing is intact to soft touch on all 4 extremities. No evidence of extinction is noted. Pinprick testing intact bilaterally with exception of decreased sensation of heel, thickened skin noted of heels.  Coordination: Cerebellar  testing reveals good finger-nose-finger and heel-to-shin bilaterally.  Gait and station: Gait is stable with single prong cane, slight right limp noted   Skin: bruising noted to bilateral shins from accident at [place] where Emily Peck walked into a flat bed. No open wounds or obvious signs of infection.   DIAGNOSTIC DATA (LABS, IMAGING, TESTING) - I reviewed patient records, labs, notes, testing and imaging myself where available.      View : No data to display.           Lab Results  Component Value Date   WBC 6.9 01/01/2021   HGB 11.4 (L) 01/01/2021   HCT 37.3 01/01/2021   MCV 100.0 01/01/2021   PLT 217 01/01/2021      Component Value Date/Time   NA 137 01/01/2021 0954   K 3.8 01/01/2021 0954   CL 108 01/01/2021 0954   CO2 23 01/01/2021 0954   GLUCOSE 76 01/01/2021 0954   BUN 15 01/01/2021 0954   CREATININE 0.86 01/01/2021 0954   CALCIUM 9.0 01/01/2021 0954   PROT 6.6 09/30/2020 1050   PROT 6.9 04/19/2019 1059   ALBUMIN 3.2 (L) 09/30/2020 1050   AST 18 09/30/2020 1050   ALT 14 09/30/2020 1050   ALKPHOS 71 09/30/2020 1050   BILITOT 0.4 09/30/2020 1050   GFRNONAA >60 01/01/2021 0954   GFRAA >60 02/25/2018 1329   Lab Results  Component Value Date   CHOL  07/11/2009    180        ATP III CLASSIFICATION:  <200     mg/dL   Desirable  200-239  mg/dL  Borderline High  >=240    mg/dL   High          HDL 66 07/11/2009   LDLCALC (H) 07/11/2009    103        Total Cholesterol/HDL:CHD Risk Coronary Heart Disease Risk Table                     Men   Women  1/2 Average Risk   3.4   3.3  Average Risk       5.0   4.4  2 X Average Risk   9.6   7.1  3 X Average Risk  23.4   11.0        Use the calculated Patient Ratio above and the CHD Risk Table to determine the patient's CHD Risk.        ATP III CLASSIFICATION (LDL):  <100     mg/dL   Optimal  100-129  mg/dL   Near or Above                    Optimal  130-159  mg/dL   Borderline  160-189  mg/dL   High  >190      mg/dL   Very High   TRIG 53 07/11/2009   CHOLHDL 2.7 07/11/2009   Lab Results  Component Value Date   HGBA1C 5.4 04/19/2019   Lab Results  Component Value Date   VITAMINB12 4,770 (H) 09/30/2020      ASSESSMENT AND PLAN 66 y.o. year old female  has a past medical history of Anxiety, Clotting disorder ([place]), Depression, Dyspnea, GERD (gastroesophageal reflux disease), Headache, Lumbosacral spondylosis without myelopathy, Neuropathy, Postlaminectomy syndrome, lumbar region, Postlaminectomy syndrome, thoracic region, and Stroke ([place]). here with     ICD-10-CM   1. Peripheral polyneuropathy  G62.9         Jadia continues to have significant burning pain of bilateral feet. Emily Peck does not feel symptoms are any worse and are improved with gabapentin. Emily Peck is currently at max dose of gabapentin. No obvious adverse effects noted but we have reviewed potential for increased dizziness and sedation. Emily Peck continues chronic pain medications as well and we will monitor closely. Emily Peck is followed closely by PCP . No recent falls. I will send notes to pain management to see if they offer Qutenza therapy. Emily Peck will continue close follow up with PCP for edema. Healthy lifestyle habits encouraged. Emily Peck will call for any worsening and follow up with me in 1 year. Emily Peck verbalizes understanding and agreement with this plan.    No orders of the defined types were placed in this encounter.    Meds ordered this encounter  Medications   gabapentin (NEURONTIN) 600 MG tablet    Sig: Take 2 tablets (1,200 mg total) by mouth 3 (three) times daily.    Dispense:  540 tablet    Refill:  3    Order Specific Question:   Supervising Provider    Answer:   Bess Harvest, FNP-C 07/31/2021, 4:24 PM [facility] [address] [place], [place] [address] [phone]

## 2021-07-31 NOTE — Patient Instructions (Signed)
Below is our plan:  We will continue gabapentin '1200mg'$  three times daily. Ask pain management about possible Qutenza treatments for neuropathy pain. Continue close follow up with PCP for swelling in legs. Consider compression stockings for swelling.   Please make sure you are staying well hydrated. I recommend 50-60 ounces daily. Well balanced diet and regular exercise encouraged. Consistent sleep schedule with 6-8 hours recommended.   Please continue follow up with care team as directed.   Follow up with me in 1 year   You may receive a survey regarding today's visit. I encourage you to leave honest feed back as I do use this information to improve patient care. Thank you for seeing me today!

## 2021-08-01 ENCOUNTER — Telehealth: Payer: Self-pay | Admitting: *Deleted

## 2021-08-01 NOTE — Telephone Encounter (Signed)
Emily Lomax,NP sent following message today: "Will you guys please let her know to listen out from Dr Letta Pate' office. They should be calling to get her scheduled with Dr Curlene Dolphin for consideration of Qutenza (capsaicin patch treatment for neuropathy)."  I called pt and left detailed message for her relaying above note. Asked her to call us back if she has any questions/concerns.

## 2021-08-23 ENCOUNTER — Ambulatory Visit: Payer: Medicare HMO

## 2021-08-27 ENCOUNTER — Ambulatory Visit: Payer: Medicare HMO | Attending: Physical Medicine & Rehabilitation

## 2021-08-27 ENCOUNTER — Other Ambulatory Visit: Payer: Self-pay

## 2021-08-27 ENCOUNTER — Telehealth: Payer: Self-pay

## 2021-08-27 DIAGNOSIS — M5459 Other low back pain: Secondary | ICD-10-CM | POA: Insufficient documentation

## 2021-08-27 DIAGNOSIS — M47817 Spondylosis without myelopathy or radiculopathy, lumbosacral region: Secondary | ICD-10-CM | POA: Diagnosis not present

## 2021-08-27 DIAGNOSIS — M6281 Muscle weakness (generalized): Secondary | ICD-10-CM | POA: Insufficient documentation

## 2021-08-27 DIAGNOSIS — R2689 Other abnormalities of gait and mobility: Secondary | ICD-10-CM | POA: Insufficient documentation

## 2021-08-27 NOTE — Telephone Encounter (Signed)
Patient is calling for refill on Hydrocodone. Per PMP, last fill 07/28/21. She states the pharmacy is out of the 5-325 mg but they have 30 10-325 mg. She states she will call around and she will let us know if she finds it tomorrow att her appointment

## 2021-08-27 NOTE — Telephone Encounter (Signed)
Patient called back and stated [facility] on [address] has the Hydrocodone 5-325 mg. Please send to [facility]

## 2021-08-28 ENCOUNTER — Encounter: Payer: Self-pay | Admitting: Physical Medicine & Rehabilitation

## 2021-08-28 ENCOUNTER — Encounter: Payer: Medicare HMO | Attending: Registered Nurse | Admitting: Physical Medicine & Rehabilitation

## 2021-08-28 VITALS — BP 116/76 | HR 88 | Ht 63.0 in | Wt 201.0 lb

## 2021-08-28 DIAGNOSIS — M47817 Spondylosis without myelopathy or radiculopathy, lumbosacral region: Secondary | ICD-10-CM | POA: Insufficient documentation

## 2021-08-28 DIAGNOSIS — G894 Chronic pain syndrome: Secondary | ICD-10-CM | POA: Diagnosis present

## 2021-08-28 DIAGNOSIS — G629 Polyneuropathy, unspecified: Secondary | ICD-10-CM | POA: Diagnosis present

## 2021-08-28 MED ORDER — HYDROCODONE-ACETAMINOPHEN 5-325 MG PO TABS: 1.0000 | ORAL_TABLET | Freq: Two times a day (BID) | ORAL | 0 refills | Status: DC | PRN

## 2021-08-31 ENCOUNTER — Encounter (HOSPITAL_BASED_OUTPATIENT_CLINIC_OR_DEPARTMENT_OTHER): Payer: Medicare HMO | Admitting: Physical Medicine & Rehabilitation

## 2021-08-31 ENCOUNTER — Encounter: Payer: Self-pay | Admitting: Physical Medicine & Rehabilitation

## 2021-08-31 VITALS — BP 114/77 | HR 64 | Ht 63.0 in | Wt 201.0 lb

## 2021-08-31 DIAGNOSIS — G894 Chronic pain syndrome: Secondary | ICD-10-CM

## 2021-08-31 DIAGNOSIS — M47817 Spondylosis without myelopathy or radiculopathy, lumbosacral region: Secondary | ICD-10-CM | POA: Diagnosis not present

## 2021-08-31 DIAGNOSIS — G629 Polyneuropathy, unspecified: Secondary | ICD-10-CM

## 2021-08-31 NOTE — Progress Notes (Unsigned)
Qutenza [facility] [id number]    Subjective:    Patient ID: Emily Peck, female    DOB: 09-11-1955, 66 y.o.   MRN: [id number]  HPI Patient is a 66 year old female with past medical history of polyneuropathy, lower back pain, carpal tunnel syndrome, chronic knee pain who is here for  Qutenza.  Pain started with pins-and-needles and burning in her feet.  Initially was on the bottom of her feet but now it is at the top of her feet as well.  She is followed closely by Dr. Letta Pate for management of her chronic pain.  She is also followed by neurology for peripheral neuropathy.  She has been on gabapentin 1200 mg 3 times daily for her neuropathy.   Pain is burning quality throughout her feet and is severe and interferes with her sleep.  She continues to have severe pain despite maximum dose of gabapentin.  She would like to try Qutenza for her neuropathic pain.   Family History  Problem Relation Age of Onset   Cancer Mother    Diabetes Mother    Social History   Socioeconomic History   Marital status: Single    Spouse name: Not on file   Number of children: 2   Years of education: Not on file   Highest education level: Not on file  Occupational History   Not on file  Tobacco Use   Smoking status: Never   Smokeless tobacco: Never  Vaping Use   Vaping Use: Never used  Substance and Sexual Activity   Alcohol use: No   Drug use: No   Sexual activity: Not on file  Other Topics Concern   Not on file  Social History Narrative   Not on file   Social Determinants of Health   Financial Resource Strain: Not on file  Food Insecurity: Not on file  Transportation Needs: Not on file  Physical Activity: Not on file  Stress: Not on file  Social Connections: Not on file   Past Surgical History:  Procedure Laterality Date   ABDOMINAL HYSTERECTOMY     BACK SURGERY     BIOPSY  10/02/2020   Procedure: BIOPSY;  Surgeon: Juanita Craver, MD;  Location: [place];  Service: Endoscopy;;    COLONOSCOPY WITH PROPOFOL N/A 10/02/2020   Procedure: COLONOSCOPY WITH PROPOFOL;  Surgeon: Juanita Craver, MD;  Location: [place];  Service: Endoscopy;  Laterality: N/A;   ESOPHAGOGASTRODUODENOSCOPY (EGD) WITH PROPOFOL N/A 10/02/2020   Procedure: ESOPHAGOGASTRODUODENOSCOPY (EGD) WITH PROPOFOL;  Surgeon: Juanita Craver, MD;  Location: [facility] ENDOSCOPY;  Service: Endoscopy;  Laterality: N/A;   EYE SURGERY     eye removed right    GIVENS CAPSULE STUDY N/A 10/02/2020   Procedure: GIVENS CAPSULE STUDY;  Surgeon: Juanita Craver, MD;  Location: [facility] ENDOSCOPY;  Service: Endoscopy;  Laterality: N/A;   KNEE ARTHROSCOPY     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   Procedure: TOTAL KNEE ARTHROPLASTY;  Surgeon: Melrose Nakayama, MD;  Location: [place];  Service: Orthopedics;  Laterality: Right;   Past Surgical History:  Procedure Laterality Date   ABDOMINAL HYSTERECTOMY     BACK SURGERY     BIOPSY  10/02/2020   Procedure: BIOPSY;  Surgeon: Juanita Craver, MD;  Location: [place];  Service: Endoscopy;;   COLONOSCOPY WITH PROPOFOL N/A 10/02/2020   Procedure: COLONOSCOPY WITH PROPOFOL;  Surgeon: Juanita Craver, MD;  Location: [facility] ENDOSCOPY;  Service: Endoscopy;  Laterality: N/A;   ESOPHAGOGASTRODUODENOSCOPY (EGD) WITH PROPOFOL N/A 10/02/2020   Procedure: ESOPHAGOGASTRODUODENOSCOPY (EGD) WITH PROPOFOL;  Surgeon: Juanita Craver, MD;  Location: [facility] ENDOSCOPY;  Service: Endoscopy;  Laterality: N/A;   EYE SURGERY     eye removed right    GIVENS CAPSULE STUDY N/A 10/02/2020   Procedure: GIVENS CAPSULE STUDY;  Surgeon: Juanita Craver, MD;  Location: [facility] ENDOSCOPY;  Service: Endoscopy;  Laterality: N/A;   KNEE ARTHROSCOPY     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   Procedure: TOTAL KNEE ARTHROPLASTY;  Surgeon: Melrose Nakayama, MD;  Location: [place];  Service: Orthopedics;  Laterality: Right;   Past Medical History:  Diagnosis Date   Anxiety    Clotting disorder (HCC)    Depression    Dyspnea    GERD (gastroesophageal reflux disease)     Headache    Lumbosacral spondylosis without myelopathy    Neuropathy    Postlaminectomy syndrome, lumbar region    Postlaminectomy syndrome, thoracic region    Stroke (HCC)    BP 114/77   Pulse 64   Ht '5\' 3"'$  (1.6 m)   Wt 91.2 kg   SpO2 95%   BMI 35.61 kg/m   Opioid Risk Score:   Fall Risk Score:  `1  Depression screen [facility] 2/9     08/28/2021   10:43 AM 05/29/2021    2:35 PM 12/29/2020    8:58 AM 10/20/2020    9:58 AM 09/22/2020    8:37 AM 03/17/2020    9:21 AM 07/22/2019    1:33 PM  Depression screen PHQ 2/9  Decreased Interest 0 2 0 0 0 0 0  Down, Depressed, Hopeless 0 2 0 0 0 0 0  PHQ - 2 Score 0 4 0 0 0 0 0  Altered sleeping    0     Tired, decreased energy    0     Change in appetite    0     Feeling bad or failure about yourself     0     Trouble concentrating    0     Moving slowly or fidgety/restless    0     Suicidal thoughts    0     PHQ-9 Score    0     Difficult doing work/chores    Not difficult at all        Review of Systems  Constitutional: Negative.   Respiratory: Negative.    Cardiovascular: Negative.        Objective:   Physical Exam Vitals and nursing note reviewed.  Constitutional:      General: She is awake.     Appearance: Normal appearance. She is well-groomed.  HENT:     Head: Normocephalic and atraumatic.  Eyes:     Conjunctiva/sclera: Conjunctivae normal.     Pupils: Pupils are equal, round, and reactive to light.  Pulmonary:     Effort: No tachypnea, accessory muscle usage or respiratory distress.  Neurological:     Mental Status: She is alert.  Psychiatric:        Mood and Affect: Mood normal.        Behavior: Behavior normal.   No focal neurological deficits noted Patient ambulates with a cane Altered sensation throughout b/l LE No noted skin breakdown in the bilateral lower extremities    Assessment & Plan:  Bilateral lower extremity polyneuropathy -Patient has chronic peripheral neuropathy closely monitored by  neurology.  She continues to have pain despite maximum dose of gabapentin -Discussed risks and benefits of Qutenza -1 patch of Qutenza was applied to the area  of pain. Ice packs were applied during the procedure to ensure patient comfort. Blood pressure was monitored every 15 minutes. The patient tolerated the procedure well. Post-procedure instructions were given and follow-up has been scheduled.  -40 minute q54month Qutenza for diabetic peripheral neuropathy

## 2021-09-06 ENCOUNTER — Encounter: Payer: Self-pay | Admitting: Rehabilitative and Restorative Service Providers"

## 2021-09-06 ENCOUNTER — Ambulatory Visit
Payer: Medicare HMO | Attending: Physical Medicine & Rehabilitation | Admitting: Rehabilitative and Restorative Service Providers"

## 2021-09-06 DIAGNOSIS — M6281 Muscle weakness (generalized): Secondary | ICD-10-CM | POA: Diagnosis present

## 2021-09-06 DIAGNOSIS — M5459 Other low back pain: Secondary | ICD-10-CM | POA: Diagnosis present

## 2021-09-06 DIAGNOSIS — R2689 Other abnormalities of gait and mobility: Secondary | ICD-10-CM | POA: Insufficient documentation

## 2021-09-06 NOTE — Therapy (Signed)
OUTPATIENT PHYSICAL THERAPY TREATMENT NOTE   Patient Name: Emily Peck MRN: [id number] DOB:22-Feb-1956, 66 y.o., female Today's Date: 09/06/2021  PCP: Darrol Jump, NP REFERRING PROVIDER: Alysia Penna,  MD  END OF SESSION:   PT End of Session - 09/06/21 1054     Visit Number 2    Date for PT Re-Evaluation 10/22/21    Authorization Type [employer]    Authorization Time Period 08/27/21-10/22/21    Authorization - Visit Number 2    Authorization - Number of Visits 12    Progress Note Due on Visit 10    PT Start Time 1055    PT Stop Time 1135    PT Time Calculation (min) 40 min    Activity Tolerance Patient tolerated treatment well    Behavior During Therapy WFL for tasks assessed/performed             Past Medical History:  Diagnosis Date   Anxiety    Clotting disorder ([place])    Depression    Dyspnea    GERD (gastroesophageal reflux disease)    Headache    Lumbosacral spondylosis without myelopathy    Neuropathy    Postlaminectomy syndrome, lumbar region    Postlaminectomy syndrome, thoracic region    Stroke [facility])    Past Surgical History:  Procedure Laterality Date   ABDOMINAL HYSTERECTOMY     BACK SURGERY     BIOPSY  10/02/2020   Procedure: BIOPSY;  Surgeon: Juanita Craver, MD;  Location: [place];  Service: Endoscopy;;   COLONOSCOPY WITH PROPOFOL N/A 10/02/2020   Procedure: COLONOSCOPY WITH PROPOFOL;  Surgeon: Juanita Craver, MD;  Location[facility] END[facility]COPY;  Service: Endoscopy;  Laterality: N/A;   ESOPHAGOGASTRODUODENOSCOPY (EGD) WITH PROPOFOL N/A 10/02/2020   Procedure: ESOPHAGOGASTRODUODENOSCOPY (EGD) WITH PROPOFOL;  Surgeon: Juanita Craver, MD;  Location: [facility] ENDOSCOPY;  Service: Endoscopy;  Laterality: N/A;   EYE SURGERY     eye removed right    GIVENS CAPSULE STUDY N/A 10/02/2020   Procedure: GIVENS CAPSULE STUDY;  Surgeon: Juanita Craver, MD;  Location: [facility] ENDOSCOPY;  Service: Endoscopy;  Laterality: N/A;   KNEE ARTHROSCOPY     TOTAL KNEE ARTHROPLASTY Right  01/09/2016   Procedure: TOTAL KNEE ARTHROPLASTY;  Surgeon: Melrose Nakayama, MD;  Location: [place];  Service: Orthopedics;  Laterality: Right;   Patient Active Problem List   Diagnosis Date Noted   Iron deficiency anemia 10/23/2020   Symptomatic anemia 09/30/2020   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   Category 4 blindness of left eye 04/19/2019   Nocturia more than twice per night 04/19/2019   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   Nasal septal deviation 04/19/2019   Excessive daytime sleepiness 04/19/2019   Neuropathy 04/19/2019   Sleep-related headache 04/19/2019   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[place]) 04/19/2019   Rotator cuff syndrome of both shoulders 06/09/2018   Cervicalgia 03/19/2018   Disorder of rotator cuff syndrome of right shoulder and allied disorder 01/09/2018   Primary osteoarthritis of right knee 01/09/2016   Peripheral polyneuropathy 10/19/2015   Knee pain, chronic 06/11/2013   Thoracic postlaminectomy syndrome 06/11/2013   Pes planus of both feet 06/11/2013   Trochanteric bursitis of both hips 11/06/2012   Lumbosacral spondylosis without myelopathy 05/09/2011   DEPRESSION 07/24/2006   CARPAL TUNNEL SYNDROME, RIGHT 07/24/2006   MYOPIA, 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  Requires SBA for mobility  SUBJECTIVE: Pt reports that she has done some of her HEP, but not  daily.  States that she had some increased pain on the rainy days.  PAIN:  Are you having pain? Yes: NPRS scale: 3-4/10 Pain location: lumbar Pain description: sharp and aching Aggravating factors: certain movements Relieving factors: rest  PATIENT GOALS:  reduce LBP  OBJECTIVE: (objective measures completed at initial evaluation unless otherwise dated)   DIAGNOSTIC FINDINGS:  MRI 07/02/21: Progressive disc degeneration at L2-3 with mild spinal and left neural foraminal stenosis. 2. Severe L4-5 facet arthrosis with new trace anterolisthesis. No stenosis.   PATIENT SURVEYS:  FOTO 6 (goal is 18)   SCREENING FOR RED FLAGS: Bowel or bladder incontinence: No Spinal tumors: No Cauda equina syndrome: No Compression fracture: No Abdominal aneurysm: No   COGNITION:           Overall cognitive status: Within functional limits for tasks assessed                          SENSATION: WFL   MUSCLE LENGTH: Hamstrings limited by 25-50% with pain    POSTURE: rounded shoulders, forward head, and flexed trunk    PALPATION: Diffuse palpable tenderness over bil lumbar paraspinals and gluteals    LUMBAR ROM:    Not tested due to balance deficits.    LOWER EXTREMITY ROM:      Limited by 25-50% bilaterally with pain    LOWER EXTREMITY MMT:     Bil hips 4-/5 with pain, knees 4/5 with pain   LUMBAR SPECIAL TESTS:  Straight leg raise test: Positive and Slump test: Positive bil- due to pain with all movement    FUNCTIONAL TESTS:  5x sit to stand: 21 seconds with use of hands    GAIT: Distance walked: 100 Assistive device utilized: Single point cane Level of assistance: CGA (secondary to vision) Comments: flexed trunk and antalgic gait       TODAY'S TREATMENT:  09/06/2021: Nustep level 1 x6 minutes with PT present to discuss status Seated with 1# ankle weights:  heel/toe raises, marching, LAQ, hip abduction scissors.  BLE 2x10 reps Sit to/from stand x10 reps with UE  use Lower Trunk Rotation 5 x 10 sec hold Single knee to chest 3 x 10 sec hold bilat Hooklying posterior pelvic tilt 2x10 Trigger Point Dry-Needling  Treatment instructions: Expect mild to moderate muscle soreness. S/S of pneumothorax if dry needled over a lung field, and to seek immediate medical attention should they occur. Patient verbalized understanding of these instructions and education. Patient Consent Given: Yes Education handout provided: Yes Muscles treated: bilateral lumbar multifidi Electrical stimulation performed: No Parameters: N/A Treatment response/outcome: located trigger points utilizing skilled palpation. Able to palpate muscle twitch response and muscle elongation following.  Date:  08/25/21 HEP established-see below  Aquatic info       PATIENT EDUCATION:  Education details: Access Code: [email or web address] Person educated: Patient Education method: Explanation, Demonstration, and Handouts Education comprehension: verbalized understanding and returned demonstration     HOME EXERCISE PROGRAM: Access Code: [email or web address] URL: [email or web address] Date: 08/27/2021 Prepared by: Claiborne Billings  Exercises - Supine Lower Trunk Rotation  - 1 x daily - 7 x weekly - 1 sets - 3 reps - 20 hold - Hooklying Single Knee to Chest  - 3 x daily - 7 x weekly - 1 sets - 3 reps - 20 hold - Seated Hamstring Stretch  - 3 x daily - 7 x weekly - 1 sets - 3 reps - 20 hold   ASSESSMENT:   CLINICAL IMPRESSION: Ms Roarty presents to skilled rehabilitation reporting overall decreased pain since initial evaluation, but does state that she spent a couple of days in bed secondary to pain and did not do as much of her HEP as was recommended.  Pt requires cuing during therex for technique and pacing, especially during lumbar stretching to not pull stretch too far to avoid pain.  Pt agreeable to trying dry needling therapy for back pain.  Following session, pt reported decreased pain and "feeling  better." Pt to have first aquatic PT session next week and she is eager to work in the pool.  Pt continues to require skilled PT to progress towards goal related activities.     OBJECTIVE IMPAIRMENTS Abnormal gait, decreased activity tolerance, decreased strength, increased muscle spasms, impaired flexibility, postural dysfunction, and pain.    ACTIVITY LIMITATIONS carrying, lifting, standing, squatting, and stairs   PARTICIPATION LIMITATIONS: meal prep, cleaning, laundry, and community activity   PERSONAL FACTORS Age and 1-2 comorbidities: chronic LBP, lumbar fusion, legally blind  are also affecting patient's functional outcome.    REHAB POTENTIAL: Good   CLINICAL DECISION MAKING: Evolving/moderate complexity   EVALUATION COMPLEXITY: Moderate     GOALS: Goals reviewed with patient? Yes   SHORT TERM GOALS: Target date: 09/24/2021   Be independent in initial HEP Baseline: Goal status: Ongoing   2.  Report > or = to 25% reduction in LBP with standing and walking Baseline: 6-7/10 Goal status: INITIAL   3.  Verbalize and demonstrate postural corrections to improve standing posture for ADLs and gait  Baseline: flexed trunk in standing  Goal status: INITIAL       LONG TERM GOALS: Target date: 10/22/2021   Be independent in advanced HEP Baseline:  Goal status: INITIAL   2.  Increase FOTO to > or = to 44 Baseline: 37 Goal status: INITIAL   3.  Report > or = to 50% reduction in LBP with standing and walking  Baseline: 6-7/10 Goal status: INITIAL   4.  Demonstrate erect standing posture due to improved endurance and strength > or = to 50% of the time Baseline:  Goal status: INITIAL   5.  Perform 5x sit to stand in < or = to 13 seconds to improve balance and mobility  Baseline: 21 seconds  Goal status: INITIAL         PLAN: PT FREQUENCY: 1-2x/week   PT DURATION: 8 weeks   PLANNED INTERVENTIONS: Therapeutic exercises, Therapeutic activity, Neuromuscular  re-education, Balance training, Gait training, Patient/Family education, Joint mobilization, Stair training, Aquatic Therapy, Dry Needling, Electrical stimulation, Spinal manipulation, Spinal mobilization, Cryotherapy, Moist heat, Taping, and Manual therapy.   PLAN FOR NEXT SESSION: update HEP as indicated, assess response to dry needling and perform as indicated, gentle mobility, core stability    Juel Burrow, PT 09/06/2021, 11:44 AM  [facility] [address], [place], [place] [address] Phone # [phone] Fax [phone]

## 2021-09-06 NOTE — Patient Instructions (Signed)
Trigger Point Dry Needling ? What is Trigger Point Dry Needling (DN)? o DN is a physical therapy technique used to treat muscle pain and dysfunction.  Specifically, DN helps deactivate muscle trigger points (muscle knots).  o A thin filiform needle is used to penetrate the skin and stimulate the underlying  trigger point. The goal is for a local twitch response (LTR) to occur and for the  trigger point to relax. No medication of any kind is injected during the procedure.  ? What Does Trigger Point Dry Needling Feel Like?  o The procedure feels different for each individual patient. Some patients report  that they do not actually feel the needle enter the skin and overall the process is  not painful. Very mild bleeding may occur. However, many patients feel a deep  cramping in the muscle in which the needle was inserted. This is the local twitch  response.  ? How Will I feel after the treatment? o Soreness is normal, and the onset of soreness may not occur for a few hours.  Typically this soreness does not last longer than two days.  o Bruising is uncommon, however; ice can be used to decrease any possible  bruising.  o In rare cases feeling tired or nauseous after the treatment is normal. In addition,  your symptoms may get worse before they get better, this period will typically not  last longer than 24 hours.  ? What Can I do After My Treatment? o Increase your hydration by drinking more water for the next 24 hours. o You may place ice or heat on the areas treated that have become sore, however,  do not use heat on inflamed or bruised areas. Heat often brings more relief post  needling. o You can continue your regular activities, but vigorous activity is not  recommended initially after the treatment for 24 hours. DN is best combined with other physical therapy such as strengthening, stretching, and other  therapies.  [facility] [address], [address] [place], [place] [address] Phone # [phone] Fax [phone]  

## 2021-09-10 ENCOUNTER — Encounter (HOSPITAL_BASED_OUTPATIENT_CLINIC_OR_DEPARTMENT_OTHER): Payer: Self-pay | Admitting: Physical Therapy

## 2021-09-10 ENCOUNTER — Ambulatory Visit (HOSPITAL_BASED_OUTPATIENT_CLINIC_OR_DEPARTMENT_OTHER): Payer: Medicare HMO | Attending: Physical Medicine & Rehabilitation | Admitting: Physical Therapy

## 2021-09-10 DIAGNOSIS — Z96659 Presence of unspecified artificial knee joint: Secondary | ICD-10-CM | POA: Insufficient documentation

## 2021-09-10 DIAGNOSIS — H548 Legal blindness, as defined in USA: Secondary | ICD-10-CM | POA: Diagnosis not present

## 2021-09-10 DIAGNOSIS — Z981 Arthrodesis status: Secondary | ICD-10-CM | POA: Diagnosis not present

## 2021-09-10 DIAGNOSIS — M47817 Spondylosis without myelopathy or radiculopathy, lumbosacral region: Secondary | ICD-10-CM | POA: Insufficient documentation

## 2021-09-10 DIAGNOSIS — M5459 Other low back pain: Secondary | ICD-10-CM | POA: Diagnosis not present

## 2021-09-10 DIAGNOSIS — M6281 Muscle weakness (generalized): Secondary | ICD-10-CM | POA: Insufficient documentation

## 2021-09-10 DIAGNOSIS — R2689 Other abnormalities of gait and mobility: Secondary | ICD-10-CM | POA: Diagnosis not present

## 2021-09-10 NOTE — Therapy (Signed)
OUTPATIENT PHYSICAL THERAPY TREATMENT NOTE   Patient Name: Emily Peck MRN: [id number] DOB:[birth year], 66 y.o., female Today's Date: 09/10/2021  PCP: Darrol Jump, NP REFERRING PROVIDER: Alysia Penna,  MD  END OF SESSION:   PT End of Session - 09/10/21 0822     Visit Number 3    Date for PT Re-Evaluation 10/22/21    Authorization Type [employer]    Authorization Time Period 08/27/21-10/22/21    Authorization - Visit Number 3    Authorization - Number of Visits 12    Progress Note Due on Visit 10    PT Start Time 0830    PT Stop Time 0915    PT Time Calculation (min) 45 min    Activity Tolerance Patient tolerated treatment well    Behavior During Therapy WFL for tasks assessed/performed             Past Medical History:  Diagnosis Date   Anxiety    Clotting disorder ([place])    Depression    Dyspnea    GERD (gastroesophageal reflux disease)    Headache    Lumbosacral spondylosis without myelopathy    Neuropathy    Postlaminectomy syndrome, lumbar region    Postlaminectomy syndrome, thoracic region    Stroke [facility])    Past Surgical History:  Procedure Laterality Date   ABDOMINAL HYSTERECTOMY     BACK SURGERY     BIOPSY  10/02/2020   Procedure: BIOPSY;  Surgeon: Juanita Craver, MD;  Location: [place];  Service: Endoscopy;;   COLONOSCOPY WITH PROPOFOL N/A 10/02/2020   Procedure: COLONOSCOPY WITH PROPOFOL;  Surgeon: Juanita Craver, MD;  Location: [facility] ENDOSCOPY;  Service: Endoscopy;  Laterality: N/A;   ESOPHAGOGASTRODUODENOSCOPY (EGD) WITH PROPOFOL N/A 10/02/2020   Procedure: ESOPHAGOGASTRODUODENOSCOPY (EGD) WITH PROPOFOL;  Surgeon: Juanita Craver, MD;  Location[facility] ENDOSCOPY;  Service: Endoscopy;  Laterality: N/A;   EYE SURGERY     eye removed right    GIVENS CAPSULE STUDY N/A 10/02/2020   Procedure: GIVENS CAPSULE STUDY;  Surgeon: Juanita Craver, MD;  Location: [facility] ENDOSCOPY;  Service: Endoscopy;  Laterality: N/A;   KNEE ARTHROSCOPY     TOTAL KNEE ARTHROPLASTY Right  01/09/2016   Procedure: TOTAL KNEE ARTHROPLASTY;  Surgeon: Melrose Nakayama, MD;  Location: [place];  Service: Orthopedics;  Laterality: Right;   Patient Active Problem List   Diagnosis Date Noted   Iron deficiency anemia 10/23/2020   Symptomatic anemia 09/30/2020   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   Category 4 blindness of left eye 04/19/2019   Nocturia more than twice per night 04/19/2019   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   Nasal septal deviation 04/19/2019   Excessive daytime sleepiness 04/19/2019   Neuropathy 04/19/2019   Sleep-related headache 04/19/2019   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[place]) 04/19/2019   Rotator cuff syndrome of both shoulders 06/09/2018   Cervicalgia 03/19/2018   Disorder of rotator cuff syndrome of right shoulder and allied disorder 01/09/2018   Primary osteoarthritis of right knee 01/09/2016   Peripheral polyneuropathy 10/19/2015   Knee pain, chronic 06/11/2013   Thoracic postlaminectomy syndrome 06/11/2013   Pes planus of both feet 06/11/2013   Trochanteric bursitis of both hips 11/06/2012   Lumbosacral spondylosis without myelopathy 05/09/2011   DEPRESSION 07/24/2006   CARPAL TUNNEL SYNDROME, RIGHT 07/24/2006   MYOPIA, 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  Requires SBA for mobility  SUBJECTIVE: Pt reports no new changes since last visit.   PAIN:  Are  you having pain? Yes: NPRS scale: 3-4/10 Pain location: lumbar Pain description: sharp and aching Aggravating factors: certain movements Relieving factors: rest  PATIENT GOALS:  reduce LBP  OBJECTIVE: (objective measures completed at initial evaluation unless otherwise dated)   DIAGNOSTIC FINDINGS:  MRI 07/02/21: Progressive disc degeneration at L2-3 with mild spinal and left neural foraminal stenosis. 2. Severe L4-5 facet arthrosis with new trace anterolisthesis. No stenosis.   PATIENT SURVEYS:  FOTO 45 (goal is 8)   SCREENING FOR RED FLAGS: Bowel or bladder incontinence: No Spinal tumors: No Cauda equina syndrome: No Compression fracture: No Abdominal aneurysm: No   COGNITION:           Overall cognitive status: Within functional limits for tasks assessed                          SENSATION: WFL   MUSCLE LENGTH: Hamstrings limited by 25-50% with pain    POSTURE: rounded shoulders, forward head, and flexed trunk    PALPATION: Diffuse palpable tenderness over bil lumbar paraspinals and gluteals    LUMBAR ROM:    Not tested due to balance deficits.    LOWER EXTREMITY ROM:      Limited by 25-50% bilaterally with pain    LOWER EXTREMITY MMT:     Bil hips 4-/5 with pain, knees 4/5 with pain   LUMBAR SPECIAL TESTS:  Straight leg raise test: Positive and Slump test: Positive bil- due to pain with all movement    FUNCTIONAL TESTS:  5x sit to stand: 21 seconds with use of hands    GAIT: Distance walked: 100 Assistive device utilized: Single point cane Level of assistance: CGA (secondary to vision) Comments: flexed trunk and antalgic gait       TODAY'S TREATMENT:  09/10/21: Pt seen for aquatic therapy today.  Treatment took place in water 3.25-4.5 ft in depth at the [place]. Temp of water was 91.  Pt entered/exited the pool via stairs with supervision and bilat rail. * walking forward / backward and side stepping - 3-4 laps of each, UE on  white floatation barbell  * holding wall:  heel/toe raises x 10;  squats x 10; hip ext x 10; hip abdct x 10; hip circles CW (straight knee) x 10 each * sitting on bench in water:  STS x 10 with cues for ant weight shift prior to ascending and core engagement - CGA required;  LAQ with DF for dynamic calf stretch * sitting on 4th step: flutter kick; hip abdct/ add * standing with foot on 2nd step, holding rails - lunge forward for knee ROM, followed by hamstring stretch x 15 sec each leg  Pt requires buoyancy for support and to offload joints with strengthening/balance exercises. Viscosity of the water is needed for resistance of strengthening; water current perturbations provides challenge to standing balance unsupported, requiring increased core activation.   09/06/2021: Nustep level 1 x6 minutes with PT present to discuss status Seated with 1# ankle weights:  heel/toe raises,  marching, LAQ, hip abduction scissors.  BLE 2x10 reps Sit to/from stand x10 reps with UE use Lower Trunk Rotation 5 x 10 sec hold Single knee to chest 3 x 10 sec hold bilat Hooklying posterior pelvic tilt 2x10 Trigger Point Dry-Needling  Treatment instructions: Expect mild to moderate muscle soreness. S/S of pneumothorax if dry needled over a lung field, and to seek immediate medical attention should they occur. Patient verbalized understanding of these instructions and education. Patient Consent Given: Yes Education handout provided: Yes Muscles treated: bilateral lumbar multifidi Electrical stimulation performed: No Parameters: N/A Treatment response/outcome: located trigger points utilizing skilled palpation. Able to palpate muscle twitch response and muscle elongation following.      PATIENT EDUCATION:  Education details: Access Code: [email or web address] Person educated: Patient Education method: Consulting civil engineer, Media planner, and Handouts Education comprehension: verbalized understanding and returned demonstration      HOME EXERCISE PROGRAM: Access Code: [email or web address] URL: [email or web address] Date: 08/27/2021 Prepared by: Claiborne Billings   Exercises - Supine Lower Trunk Rotation  - 1 x daily - 7 x weekly - 1 sets - 3 reps - 20 hold - Hooklying Single Knee to Chest  - 3 x daily - 7 x weekly - 1 sets - 3 reps - 20 hold - Seated Hamstring Stretch  - 3 x daily - 7 x weekly - 1 sets - 3 reps - 20 hold   ASSESSMENT:   CLINICAL IMPRESSION: Pt demonstrates decreased flexibility in Lt calf and hamstring and decreased balance in R SLS requiring UE support on wall.  Due to visual impairments and decreased steadiness/confidence, pt requires CGA to SBA from therapist in the water. UE on floatation device (noodle/barbell) assisted with pt's balance. She reported elimination of back pain while submerged 50-75%.   Pt continues to require skilled PT to progress towards goal related activities.     OBJECTIVE IMPAIRMENTS Abnormal gait, decreased activity tolerance, decreased strength, increased muscle spasms, impaired flexibility, postural dysfunction, and pain.    ACTIVITY LIMITATIONS carrying, lifting, standing, squatting, and stairs   PARTICIPATION LIMITATIONS: meal prep, cleaning, laundry, and community activity   PERSONAL FACTORS Age and 1-2 comorbidities: chronic LBP, lumbar fusion, legally blind  are also affecting patient's functional outcome.    REHAB POTENTIAL: Good   CLINICAL DECISION MAKING: Evolving/moderate complexity   EVALUATION COMPLEXITY: Moderate     GOALS: Goals reviewed with patient? Yes   SHORT TERM GOALS: Target date: 09/24/2021   Be independent in initial HEP Baseline: Goal status: Ongoing   2.  Report > or = to 25% reduction in LBP with standing and walking Baseline: 6-7/10 Goal status: INITIAL   3.  Verbalize and demonstrate postural corrections to improve standing posture for ADLs and gait  Baseline: flexed trunk in standing  Goal status: INITIAL       LONG TERM GOALS:  Target date: 10/22/2021   Be independent in advanced HEP Baseline:  Goal status: INITIAL   2.  Increase FOTO to > or = to 44 Baseline: 37 Goal status: INITIAL   3.  Report > or = to 50% reduction in LBP with standing and walking  Baseline: 6-7/10 Goal status: INITIAL   4.  Demonstrate erect standing posture due to improved endurance and strength > or = to 50% of the time Baseline:  Goal status: INITIAL   5.  Perform 5x sit to stand in < or = to 13 seconds to improve balance and mobility  Baseline: 21 seconds  Goal status: INITIAL  PLAN: PT FREQUENCY: 1-2x/week   PT DURATION: 8 weeks   PLANNED INTERVENTIONS: Therapeutic exercises, Therapeutic activity, Neuromuscular re-education, Balance training, Gait training, Patient/Family education, Joint mobilization, Stair training, Aquatic Therapy, Dry Needling, Electrical stimulation, Spinal manipulation, Spinal mobilization, Cryotherapy, Moist heat, Taping, and Manual therapy.   PLAN FOR NEXT SESSION: update HEP as indicated, assess response to dry needling and perform as indicated, gentle mobility, core stability   Kerin Perna, PTA 09/10/21 9:33 AM

## 2021-09-13 ENCOUNTER — Ambulatory Visit: Payer: Medicare HMO | Admitting: Rehabilitative and Restorative Service Providers"

## 2021-09-13 DIAGNOSIS — M6281 Muscle weakness (generalized): Secondary | ICD-10-CM | POA: Diagnosis not present

## 2021-09-13 DIAGNOSIS — R2689 Other abnormalities of gait and mobility: Secondary | ICD-10-CM

## 2021-09-13 DIAGNOSIS — M5459 Other low back pain: Secondary | ICD-10-CM

## 2021-09-13 NOTE — Therapy (Signed)
OUTPATIENT PHYSICAL THERAPY TREATMENT NOTE   Patient Name: Emily Peck MRN: [id number] DOB:[birth year], 66 y.o., female Today's Date: 09/13/2021  PCP: Darrol Jump, NP REFERRING PROVIDER: Alysia Penna,  MD  END OF SESSION:   PT End of Session - 09/13/21 1035     Visit Number 4    Date for PT Re-Evaluation 10/22/21    Authorization Type [employer]    Authorization Time Period 08/27/21-10/22/21    Authorization - Visit Number 4    Authorization - Number of Visits 12    Progress Note Due on Visit 10    PT Start Time 1010    PT Stop Time 1050    PT Time Calculation (min) 40 min    Activity Tolerance Patient tolerated treatment well    Behavior During Therapy WFL for tasks assessed/performed              Past Medical History:  Diagnosis Date   Anxiety    Clotting disorder ([place])    Depression    Dyspnea    GERD (gastroesophageal reflux disease)    Headache    Lumbosacral spondylosis without myelopathy    Neuropathy    Postlaminectomy syndrome, lumbar region    Postlaminectomy syndrome, thoracic region    Stroke [facility])    Past Surgical History:  Procedure Laterality Date   ABDOMINAL HYSTERECTOMY     BACK SURGERY     BIOPSY  10/02/2020   Procedure: BIOPSY;  Surgeon: Juanita Craver, MD;  Location: [place];  Service: Endoscopy;;   COLONOSCOPY WITH PROPOFOL N/A 10/02/2020   Procedure: COLONOSCOPY WITH PROPOFOL;  Surgeon: Juanita Craver, MD;  Location[facility] ENDOSCOPY;  Service: Endoscopy;  Laterality: N/A;   ESOPHAGOGASTRODUODENOSCOPY (EGD) WITH PROPOFOL N/A 10/02/2020   Procedure: ESOPHAGOGASTRODUODENOSCOPY (EGD) WITH PROPOFOL;  Surgeon: Juanita Craver, MD;  Location: [facility] ENDOSCOPY;  Service: Endoscopy;  Laterality: N/A;   EYE SURGERY     eye removed right    GIVENS CAPSULE STUDY N/A 10/02/2020   Procedure: GIVENS CAPSULE STUDY;  Surgeon: Juanita Craver, MD;  Location: [facility] ENDOSCOPY;  Service: Endoscopy;  Laterality: N/A;   KNEE ARTHROSCOPY     TOTAL KNEE ARTHROPLASTY Right  01/09/2016   Procedure: TOTAL KNEE ARTHROPLASTY;  Surgeon: Melrose Nakayama, MD;  Location: [place];  Service: Orthopedics;  Laterality: Right;   Patient Active Problem List   Diagnosis Date Noted   Iron deficiency anemia 10/23/2020   Symptomatic anemia 09/30/2020   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   Category 4 blindness of left eye 04/19/2019   Nocturia more than twice per night 04/19/2019   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   Nasal septal deviation 04/19/2019   Excessive daytime sleepiness 04/19/2019   Neuropathy 04/19/2019   Sleep-related headache 04/19/2019   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[place]) 04/19/2019   Rotator cuff syndrome of both shoulders 06/09/2018   Cervicalgia 03/19/2018   Disorder of rotator cuff syndrome of right shoulder and allied disorder 01/09/2018   Primary osteoarthritis of right knee 01/09/2016   Peripheral polyneuropathy 10/19/2015   Knee pain, chronic 06/11/2013   Thoracic postlaminectomy syndrome 06/11/2013   Pes planus of both feet 06/11/2013   Trochanteric bursitis of both hips 11/06/2012   Lumbosacral spondylosis without myelopathy 05/09/2011   DEPRESSION 07/24/2006   CARPAL TUNNEL SYNDROME, RIGHT 07/24/2006   MYOPIA, 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   DEGENERATIVE JOINT DISEASE 07/24/2006   DISORDER, LUMBAR DISC W/MYELOPATHY 07/24/2006   Cerebral venous sinus thrombosis 07/24/2006   AVULSION, EYE 12/23/2005    REFERRING DIAG: M47.817 (ICD-10-CM) -  Lumbosacral spondylosis without myelopathy  THERAPY DIAG:  No diagnosis found.  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  Requires SBA for mobility  SUBJECTIVE: Pt reports that she enjoyed the dry needling last week and really enjoyed the pool therapy.  Pt reports that her best friend's son was  found dead yesterday.  PAIN:  Are you having pain? Yes: NPRS scale: 4/10 Pain location: lumbar Pain description: sharp and aching Aggravating factors: certain movements Relieving factors: rest  PATIENT GOALS:  reduce LBP  OBJECTIVE: (objective measures completed at initial evaluation unless otherwise dated)   DIAGNOSTIC FINDINGS:  MRI 07/02/21: Progressive disc degeneration at L2-3 with mild spinal and left neural foraminal stenosis. 2. Severe L4-5 facet arthrosis with new trace anterolisthesis. No stenosis.   PATIENT SURVEYS:  08/27/21:  FOTO 37 (goal is 44)     MUSCLE LENGTH: Hamstrings limited by 25-50% with pain    POSTURE: rounded shoulders, forward head, and flexed trunk    PALPATION: Diffuse palpable tenderness over bil lumbar paraspinals and gluteals    LUMBAR ROM:    Not tested due to balance deficits.    LOWER EXTREMITY ROM:      Limited by 25-50% bilaterally with pain    LOWER EXTREMITY MMT:     Bil hips 4-/5 with pain, knees 4/5 with pain   LUMBAR SPECIAL TESTS:  Straight leg raise test: Positive and Slump test: Positive bil- due to pain with all movement    FUNCTIONAL TESTS:  08/27/21:  5x sit to stand: 21 seconds with use of hands    GAIT: Distance walked: 100 Assistive device utilized: Single point cane Level of assistance: CGA (secondary to vision) Comments: flexed trunk and antalgic gait       TODAY'S TREATMENT:  09/13/2021: Nustep level 1 x7 minutes with PT present to discuss status Seated with 1.5# ankle weights:  heel/toe raises, marching, LAQ, hip abduction scissors.  BLE 2x10 reps Seated 3 way green pball rollout 5 x 10 sec Sit to/from stand x10 reps without UE use Seated core series with yellow wt ball:  hip to hip, hip to shoulder, shoulder to shoulder.  X10 bilat Trigger Point Dry-Needling  Treatment instructions: Expect mild to moderate muscle soreness. S/S of pneumothorax if dry needled over a lung field, and to seek immediate  medical attention should they occur. Patient verbalized understanding of these instructions and education. Patient Consent Given: Yes Education handout provided: Yes Muscles treated: bilateral lumbar multifidi Electrical stimulation performed: No Parameters: N/A Treatment response/outcome: located trigger points utilizing skilled palpation. Able to palpate muscle twitch response and muscle elongation following.  09/10/21: Pt seen for aquatic therapy today.  Treatment took place in water 3.25-4.5 ft in depth at the [place]. Temp of water was 91.  Pt entered/exited the pool via stairs with supervision and bilat rail. * walking forward / backward and side stepping - 3-4 laps of each, UE on white floatation barbell  * holding wall:  heel/toe raises x 10;  squats x 10; hip ext x 10; hip abdct x 10; hip circles CW (straight knee) x 10 each * sitting on bench in water:  STS x 10 with cues for ant weight shift prior to ascending and core engagement - CGA required;  LAQ with DF for dynamic calf stretch * sitting on 4th step: flutter kick; hip  abdct/ add * standing with foot on 2nd step, holding rails - lunge forward for knee ROM, followed by hamstring stretch x 15 sec each leg  Pt requires buoyancy for support and to offload joints with strengthening/balance exercises. Viscosity of the water is needed for resistance of strengthening; water current perturbations provides challenge to standing balance unsupported, requiring increased core activation.   09/06/2021: Nustep level 1 x6 minutes with PT present to discuss status Seated with 1# ankle weights:  heel/toe raises, marching, LAQ, hip abduction scissors.  BLE 2x10 reps Sit to/from stand x10 reps with UE use Lower Trunk Rotation 5 x 10 sec hold Single knee to chest 3 x 10 sec hold bilat Hooklying posterior pelvic tilt 2x10 Trigger Point Dry-Needling  Treatment instructions: Expect mild to moderate muscle soreness. S/S of  pneumothorax if dry needled over a lung field, and to seek immediate medical attention should they occur. Patient verbalized understanding of these instructions and education. Patient Consent Given: Yes Education handout provided: Yes Muscles treated: bilateral lumbar multifidi Electrical stimulation performed: No Parameters: N/A Treatment response/outcome: located trigger points utilizing skilled palpation. Able to palpate muscle twitch response and muscle elongation following.      PATIENT EDUCATION:  Education details: Access Code: [email or web address] Person educated: Patient Education method: Consulting civil engineer, Media planner, and Handouts Education comprehension: verbalized understanding and returned demonstration     HOME EXERCISE PROGRAM: Access Code: [email or web address] URL: [email or web address] Date: 08/27/2021 Prepared by: Claiborne Billings   Exercises - Supine Lower Trunk Rotation  - 1 x daily - 7 x weekly - 1 sets - 3 reps - 20 hold - Hooklying Single Knee to Chest  - 3 x daily - 7 x weekly - 1 sets - 3 reps - 20 hold - Seated Hamstring Stretch  - 3 x daily - 7 x weekly - 1 sets - 3 reps - 20 hold   ASSESSMENT:   CLINICAL IMPRESSION: Ms Ace presents to skilled rehabilitation reporting that she was able to clean her living room following dry needling last time.  Pt does report having some increased pain this morning and is wanting to do dry needling again.  Pt reported decreased pain following dry needling and was able to ambulate with  more upright posture following dry needling with decreased antalgic gait pattern.  Pt continues to require skilled PT to progress towards goal related activities.     OBJECTIVE IMPAIRMENTS Abnormal gait, decreased activity tolerance, decreased strength, increased muscle spasms, impaired flexibility, postural dysfunction, and pain.    ACTIVITY LIMITATIONS carrying, lifting, standing, squatting, and stairs   PARTICIPATION LIMITATIONS: meal prep, cleaning,  laundry, and community activity   PERSONAL FACTORS Age and 1-2 comorbidities: chronic LBP, lumbar fusion, legally blind  are also affecting patient's functional outcome.    REHAB POTENTIAL: Good   CLINICAL DECISION MAKING: Evolving/moderate complexity   EVALUATION COMPLEXITY: Moderate     GOALS: Goals reviewed with patient? Yes   SHORT TERM GOALS: Target date: 09/24/2021   Be independent in initial HEP Baseline: Goal status: Ongoing   2.  Report > or = to 25% reduction in LBP with standing and walking Baseline: 6-7/10 Goal status: Ongoing   3.  Verbalize and demonstrate postural corrections to improve standing posture for ADLs and gait  Baseline: flexed trunk in standing  Goal status: INITIAL       LONG TERM GOALS: Target date: 10/22/2021   Be independent in advanced HEP Baseline:  Goal status: INITIAL   2.  Increase FOTO to > or =  to 44 Baseline: 37 Goal status: INITIAL   3.  Report > or = to 50% reduction in LBP with standing and walking  Baseline: 6-7/10 Goal status: INITIAL   4.  Demonstrate erect standing posture due to improved endurance and strength > or = to 50% of the time Baseline:  Goal status: INITIAL   5.  Perform 5x sit to stand in < or = to 13 seconds to improve balance and mobility  Baseline: 21 seconds  Goal status: INITIAL         PLAN: PT FREQUENCY: 1-2x/week   PT DURATION: 8 weeks   PLANNED INTERVENTIONS: Therapeutic exercises, Therapeutic activity, Neuromuscular re-education, Balance training, Gait training, Patient/Family education, Joint mobilization, Stair training, Aquatic Therapy, Dry Needling, Electrical stimulation, Spinal manipulation, Spinal mobilization, Cryotherapy, Moist heat, Taping, and Manual therapy.   PLAN FOR NEXT SESSION: update HEP as indicated, assess response to dry needling and perform as indicated, gentle mobility, core stability   Juel Burrow, PT 09/13/21 11:07 AM  [facility] [address], [place], [place] [address] Phone # [phone] Fax [phone]

## 2021-09-19 ENCOUNTER — Ambulatory Visit: Payer: Medicare HMO

## 2021-09-19 DIAGNOSIS — M6281 Muscle weakness (generalized): Secondary | ICD-10-CM

## 2021-09-19 DIAGNOSIS — M5459 Other low back pain: Secondary | ICD-10-CM

## 2021-09-19 DIAGNOSIS — R2689 Other abnormalities of gait and mobility: Secondary | ICD-10-CM

## 2021-09-19 NOTE — Therapy (Signed)
OUTPATIENT PHYSICAL THERAPY TREATMENT NOTE   Patient Name: Emily Peck MRN: [id number] DOB:February 16, 1956, 66 y.o., female Today's Date: 09/19/2021  PCP: Darrol Jump, NP REFERRING PROVIDER: Alysia Penna,  MD  END OF SESSION:   PT End of Session - 09/19/21 1104     Visit Number 5    Date for PT Re-Evaluation 10/22/21    Authorization Type [employer]    Authorization Time Period 08/27/21-10/22/21    Authorization - Visit Number 5    Authorization - Number of Visits 12    Progress Note Due on Visit 10    PT Start Time 1008    PT Stop Time 1054    PT Time Calculation (min) 46 min    Activity Tolerance Patient tolerated treatment well    Behavior During Therapy WFL for tasks assessed/performed               Past Medical History:  Diagnosis Date   Anxiety    Clotting disorder ([place])    Depression    Dyspnea    GERD (gastroesophageal reflux disease)    Headache    Lumbosacral spondylosis without myelopathy    Neuropathy    Postlaminectomy syndrome, lumbar region    Postlaminectomy syndrome, thoracic region    Stroke [facility])    Past Surgical History:  Procedure Laterality Date   ABDOMINAL HYSTERECTOMY     BACK SURGERY     BIOPSY  10/02/2020   Procedure: BIOPSY;  Surgeon: Juanita Craver, MD;  Location: [place];  Service: Endoscopy;;   COLONOSCOPY WITH PROPOFOL N/A 10/02/2020   Procedure: COLONOSCOPY WITH PROPOFOL;  Surgeon: Juanita Craver, MD;  Location: [facility] ENDOSCOPY;  Service: Endoscopy;  Laterality: N/A;   ESOPHAGOGASTRODUODENOSCOPY (EGD) WITH PROPOFOL N/A 10/02/2020   Procedure: ESOPHAGOGASTRODUODENOSCOPY (EGD) WITH PROPOFOL;  Surgeon: Juanita Craver, MD;  Location: [facility] ENDOSCOPY;  Service: Endoscopy;  Laterality: N/A;   EYE SURGERY     eye removed right    GIVENS CAPSULE STUDY N/A 10/02/2020   Procedure: GIVENS CAPSULE STUDY;  Surgeon: Juanita Craver, MD;  Location: [facility] ENDOSCOPY;  Service: Endoscopy;  Laterality: N/A;   KNEE ARTHROSCOPY     TOTAL KNEE ARTHROPLASTY  Right 01/09/2016   Procedure: TOTAL KNEE ARTHROPLASTY;  Surgeon: Melrose Nakayama, MD;  Location: [place];  Service: Orthopedics;  Laterality: Right;   Patient Active Problem List   Diagnosis Date Noted   Iron deficiency anemia 10/23/2020   Symptomatic anemia 09/30/2020   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   Category 4 blindness of left eye 04/19/2019   Nocturia more than twice per night 04/19/2019   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   Nasal septal deviation 04/19/2019   Excessive daytime sleepiness 04/19/2019   Neuropathy 04/19/2019   Sleep-related headache 04/19/2019   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[place]) 04/19/2019   Rotator cuff syndrome of both shoulders 06/09/2018   Cervicalgia 03/19/2018   Disorder of rotator cuff syndrome of right shoulder and allied disorder 01/09/2018   Primary osteoarthritis of right knee 01/09/2016   Peripheral polyneuropathy 10/19/2015   Knee pain, chronic 06/11/2013   Thoracic postlaminectomy syndrome 06/11/2013   Pes planus of both feet 06/11/2013   Trochanteric bursitis of both hips 11/06/2012   Lumbosacral spondylosis without myelopathy 05/09/2011   DEPRESSION 07/24/2006   CARPAL TUNNEL SYNDROME, RIGHT 07/24/2006   MYOPIA, 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   DEGENERATIVE JOINT DISEASE 07/24/2006   DISORDER, LUMBAR DISC W/MYELOPATHY 07/24/2006   Cerebral venous sinus thrombosis 07/24/2006   AVULSION, EYE 12/23/2005    REFERRING DIAG: M47.817 (  ICD-10-CM) - Lumbosacral spondylosis without myelopathy  THERAPY DIAG:  Muscle weakness (generalized)  Other low back pain  Other abnormalities of gait and mobility  Rationale for Evaluation and Treatment Rehabilitation  PERTINENT HISTORY: 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  Requires SBA for mobility  SUBJECTIVE: I feel 25% better overall.  My back is feeling good  today, my knees are stiff.   PAIN:  Are you having pain? Yes: NPRS scale: 0/10 Pain location: lumbar Pain description: sharp and aching Aggravating factors: certain movements Relieving factors: rest  PATIENT GOALS:  reduce LBP  OBJECTIVE: (objective measures completed at initial evaluation unless otherwise dated)   DIAGNOSTIC FINDINGS:  MRI 07/02/21: Progressive disc degeneration at L2-3 with mild spinal and left neural foraminal stenosis. 2. Severe L4-5 facet arthrosis with new trace anterolisthesis. No stenosis.   PATIENT SURVEYS:  08/27/21:  FOTO 37 (goal is 44)     MUSCLE LENGTH: Hamstrings limited by 25-50% with pain    POSTURE: rounded shoulders, forward head, and flexed trunk    PALPATION: Diffuse palpable tenderness over bil lumbar paraspinals and gluteals    LUMBAR ROM:    Not tested due to balance deficits.    LOWER EXTREMITY ROM:      Limited by 25-50% bilaterally with pain    LOWER EXTREMITY MMT:     Bil hips 4-/5 with pain, knees 4/5 with pain   LUMBAR SPECIAL TESTS:  Straight leg raise test: Positive and Slump test: Positive bil- due to pain with all movement    FUNCTIONAL TESTS:  08/27/21:  5x sit to stand: 21 seconds with use of hands    GAIT: Distance walked: 100 Assistive device utilized: Single point cane Level of assistance: CGA (secondary to vision) Comments: flexed trunk and antalgic gait       TODAY'S TREATMENT: 09/19/2021: Nustep level 4 x8 minutes with PT present to discuss status Sit to stand 2x10 with emphasis on control Seated hamstring stretch 2x20 seconds  Sidelying clam: 2x5 each  Seated with 1.5# ankle weights:  heel/toe raises, marching, LAQ, hip abduction scissors.  BLE 2x10 reps Sit to/from stand x10 reps without UE use  Trigger Point Dry-Needling  Treatment instructions: Expect mild to moderate muscle soreness. S/S of pneumothorax if dry needled over a lung field, and to seek immediate medical attention should they  occur. Patient verbalized understanding of these instructions and education. Patient Consent Given: Yes Education handout provided: Yes Muscles treated: bilateral lumbar multifidi Electrical stimulation performed: No Parameters: N/A Treatment response/outcome: located trigger points utilizing skilled palpation. Able to palpate muscle twitch response and muscle elongation following.  09/13/2021: Nustep level 1 x7 minutes with PT present to discuss status Seated with 1.5# ankle weights:  heel/toe raises, marching, LAQ, hip abduction scissors.  BLE 2x10 reps Seated 3 way green pball rollout 5 x 10 sec Sit to/from stand x10 reps without UE use Seated core series with yellow wt ball:  hip to hip, hip to shoulder, shoulder to shoulder.  X10 bilat Trigger Point Dry-Needling  Treatment instructions: Expect mild to moderate muscle soreness. S/S of pneumothorax if dry needled over a lung field, and to seek immediate medical attention should they occur. Patient verbalized understanding of these instructions and education. Patient Consent Given: Yes Education handout provided: Yes Muscles treated: bilateral lumbar multifidi Electrical stimulation performed: No Parameters: N/A Treatment response/outcome: located trigger points utilizing skilled palpation. Able to  palpate muscle twitch response and muscle elongation following.  09/10/21: Pt seen for aquatic therapy today.  Treatment took place in water 3.25-4.5 ft in depth at the [place]. Temp of water was 91.  Pt entered/exited the pool via stairs with supervision and bilat rail. * walking forward / backward and side stepping - 3-4 laps of each, UE on white floatation barbell  * holding wall:  heel/toe raises x 10;  squats x 10; hip ext x 10; hip abdct x 10; hip circles CW (straight knee) x 10 each * sitting on bench in water:  STS x 10 with cues for ant weight shift prior to ascending and core engagement - CGA required;  LAQ with DF  for dynamic calf stretch * sitting on 4th step: flutter kick; hip abdct/ add * standing with foot on 2nd step, holding rails - lunge forward for knee ROM, followed by hamstring stretch x 15 sec each leg  Pt requires buoyancy for support and to offload joints with strengthening/balance exercises. Viscosity of the water is needed for resistance of strengthening; water current perturbations provides challenge to standing balance unsupported, requiring increased core activation.     PATIENT EDUCATION:  Education details: Access Code: [email or web address] Person educated: Patient Education method: Consulting civil engineer, Media planner, and Handouts Education comprehension: verbalized understanding and returned demonstration     HOME EXERCISE PROGRAM: Access Code: [email or web address] URL: [email or web address] Date: 09/19/2021 Prepared by: Claiborne Billings  Exercises - Supine Lower Trunk Rotation  - 3 x daily - 7 x weekly - 1 sets - 3 reps - 20 hold - Hooklying Single Knee to Chest  - 3 x daily - 7 x weekly - 1 sets - 3 reps - 20 hold - Seated Hamstring Stretch  - 3 x daily - 7 x weekly - 1 sets - 3 reps - 20 hold - Sit to Stand Without Arm Support  - 2 x daily - 7 x weekly - 2 sets - 5 reps - Clamshell  - 2 x daily - 7 x weekly - 2 sets - 5 reps   ASSESSMENT:   CLINICAL IMPRESSION: Pt reports 25% overall reduction in pain since the start of care and arrives today without pain.  Pt reports varied pain levels from 0-8/10 in the low back.  Pt reports compliance with HEP and PT added 2 new exercises for strength.  Pt requires supervision with exercises for verbal cueing.   Clamshell is more challenging with Lt LE and pt required heavy tactile cueing. Pt reported decreased pain following dry needling and was able to ambulate with  more upright posture following dry needling.  Pt continues to require skilled PT to progress towards goal related activities.     OBJECTIVE IMPAIRMENTS Abnormal gait, decreased activity  tolerance, decreased strength, increased muscle spasms, impaired flexibility, postural dysfunction, and pain.    ACTIVITY LIMITATIONS carrying, lifting, standing, squatting, and stairs   PARTICIPATION LIMITATIONS: meal prep, cleaning, laundry, and community activity   PERSONAL FACTORS Age and 1-2 comorbidities: chronic LBP, lumbar fusion, legally blind  are also affecting patient's functional outcome.    REHAB POTENTIAL: Good   CLINICAL DECISION MAKING: Evolving/moderate complexity   EVALUATION COMPLEXITY: Moderate     GOALS: Goals reviewed with patient? Yes   SHORT TERM GOALS: Target date: 09/24/2021   Be independent in initial HEP Baseline: Goal status: MET   2.  Report > or = to 25% reduction in LBP with standing and walking Baseline: 25% reported (09/19/21) Goal status: MET  3.  Verbalize and demonstrate postural corrections to improve standing posture for ADLs and gait  Baseline: flexed trunk in standing  Goal status: INITIAL       LONG TERM GOALS: Target date: 10/22/2021   Be independent in advanced HEP Baseline:  Goal status: INITIAL   2.  Increase FOTO to > or = to 44 Baseline: 37 Goal status: INITIAL   3.  Report > or = to 50% reduction in LBP with standing and walking  Baseline: 6-7/10 Goal status: INITIAL   4.  Demonstrate erect standing posture due to improved endurance and strength > or = to 50% of the time Baseline:  Goal status: INITIAL   5.  Perform 5x sit to stand in < or = to 13 seconds to improve balance and mobility  Baseline: 21 seconds  Goal status: INITIAL         PLAN: PT FREQUENCY: 1-2x/week   PT DURATION: 8 weeks   PLANNED INTERVENTIONS: Therapeutic exercises, Therapeutic activity, Neuromuscular re-education, Balance training, Gait training, Patient/Family education, Joint mobilization, Stair training, Aquatic Therapy, Dry Needling, Electrical stimulation, Spinal manipulation, Spinal mobilization, Cryotherapy, Moist heat, Taping,  and Manual therapy.   PLAN FOR NEXT SESSION: Review new HEP, Continue functional and core strength, flexibility and DN/manual therapy as needed    , PT 09/19/21 11:06 AM   [facility] [address], [address] [place], [place] [address] Phone # [phone] Fax [phone] 

## 2021-09-21 ENCOUNTER — Encounter (HOSPITAL_BASED_OUTPATIENT_CLINIC_OR_DEPARTMENT_OTHER): Payer: Self-pay | Admitting: Physical Therapy

## 2021-09-21 ENCOUNTER — Ambulatory Visit (HOSPITAL_BASED_OUTPATIENT_CLINIC_OR_DEPARTMENT_OTHER): Payer: Medicare HMO | Admitting: Physical Therapy

## 2021-09-21 DIAGNOSIS — M5459 Other low back pain: Secondary | ICD-10-CM

## 2021-09-21 DIAGNOSIS — M47817 Spondylosis without myelopathy or radiculopathy, lumbosacral region: Secondary | ICD-10-CM | POA: Diagnosis not present

## 2021-09-21 DIAGNOSIS — M6281 Muscle weakness (generalized): Secondary | ICD-10-CM

## 2021-09-21 DIAGNOSIS — R2689 Other abnormalities of gait and mobility: Secondary | ICD-10-CM

## 2021-09-21 NOTE — Therapy (Signed)
OUTPATIENT PHYSICAL THERAPY TREATMENT NOTE   Patient Name: Emily Peck MRN: [id number] DOB:1956-01-09, 66 y.o., female Today's Date: 09/21/2021  PCP: Darrol Jump, NP REFERRING PROVIDER: Alysia Penna,  MD  END OF SESSION:   PT End of Session - 09/21/21 0936     Visit Number 6    Date for PT Re-Evaluation 10/22/21    Authorization Type [employer]    Authorization Time Period 08/27/21-10/22/21    Authorization - Visit Number 6    Authorization - Number of Visits 12    Progress Note Due on Visit 10    PT Start Time 0936    PT Stop Time 1018    PT Time Calculation (min) 42 min    Activity Tolerance Patient tolerated treatment well    Behavior During Therapy WFL for tasks assessed/performed               Past Medical History:  Diagnosis Date   Anxiety    Clotting disorder ([place])    Depression    Dyspnea    GERD (gastroesophageal reflux disease)    Headache    Lumbosacral spondylosis without myelopathy    Neuropathy    Postlaminectomy syndrome, lumbar region    Postlaminectomy syndrome, thoracic region    Stroke [facility])    Past Surgical History:  Procedure Laterality Date   ABDOMINAL HYSTERECTOMY     BACK SURGERY     BIOPSY  10/02/2020   Procedure: BIOPSY;  Surgeon: Juanita Craver, MD;  Location: [place];  Service: Endoscopy;;   COLONOSCOPY WITH PROPOFOL N/A 10/02/2020   Procedure: COLONOSCOPY WITH PROPOFOL;  Surgeon: Juanita Craver, MD;  Location: [facility] ENDOSCOPY;  Service: Endoscopy;  Laterality: N/A;   ESOPHAGOGASTRODUODENOSCOPY (EGD) WITH PROPOFOL N/A 10/02/2020   Procedure: ESOPHAGOGASTRODUODENOSCOPY (EGD) WITH PROPOFOL;  Surgeon: Juanita Craver, MD;  Location: [facility] ENDOSCOPY;  Service: Endoscopy;  Laterality: N/A;   EYE SURGERY     eye removed right    GIVENS CAPSULE STUDY N/A 10/02/2020   Procedure: GIVENS CAPSULE STUDY;  Surgeon: Juanita Craver, MD;  Location: [facility] ENDOSCOPY;  Service: Endoscopy;  Laterality: N/A;   KNEE ARTHROSCOPY     TOTAL KNEE ARTHROPLASTY  Right 01/09/2016   Procedure: TOTAL KNEE ARTHROPLASTY;  Surgeon: Melrose Nakayama, MD;  Location: [place];  Service: Orthopedics;  Laterality: Right;   Patient Active Problem List   Diagnosis Date Noted   Iron deficiency anemia 10/23/2020   Symptomatic anemia 09/30/2020   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   Category 4 blindness of left eye 04/19/2019   Nocturia more than twice per night 04/19/2019   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   Nasal septal deviation 04/19/2019   Excessive daytime sleepiness 04/19/2019   Neuropathy 04/19/2019   Sleep-related headache 04/19/2019   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[place]) 04/19/2019   Rotator cuff syndrome of both shoulders 06/09/2018   Cervicalgia 03/19/2018   Disorder of rotator cuff syndrome of right shoulder and allied disorder 01/09/2018   Primary osteoarthritis of right knee 01/09/2016   Peripheral polyneuropathy 10/19/2015   Knee pain, chronic 06/11/2013   Thoracic postlaminectomy syndrome 06/11/2013   Pes planus of both feet 06/11/2013   Trochanteric bursitis of both hips 11/06/2012   Lumbosacral spondylosis without myelopathy 05/09/2011   DEPRESSION 07/24/2006   CARPAL TUNNEL SYNDROME, RIGHT 07/24/2006   MYOPIA, 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   DEGENERATIVE JOINT DISEASE 07/24/2006   DISORDER, LUMBAR DISC W/MYELOPATHY 07/24/2006   Cerebral venous sinus thrombosis 07/24/2006   AVULSION, EYE 12/23/2005    REFERRING DIAG: M47.817 (  ICD-10-CM) - Lumbosacral spondylosis without myelopathy  THERAPY DIAG:  Muscle weakness (generalized)  Other low back pain  Other abnormalities of gait and mobility  Rationale for Evaluation and Treatment Rehabilitation  PERTINENT HISTORY: 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  Requires SBA for mobility  SUBJECTIVE: "I think I woke up on wrong side of bed", describing  a series of mishaps after waking.   Pt reports she gets relief from the DN for about 1.5 days "I wish I could get DN every day".  She states she is able to do more around the house since starting therapy.   PAIN:  Are you having pain? Yes Pain scale:  4/10 Location: Head Description:  Headache, dull/ sharp Aggravating factor: Light Alleviating factor: medicine  Bilat knees are painful only when WB - 6/10; 0/10 at rest  PATIENT GOALS:  reduce LBP  OBJECTIVE: (objective measures completed at initial evaluation unless otherwise dated)   DIAGNOSTIC FINDINGS:  MRI 07/02/21: Progressive disc degeneration at L2-3 with mild spinal and left neural foraminal stenosis. 2. Severe L4-5 facet arthrosis with new trace anterolisthesis. No stenosis.   PATIENT SURVEYS:  08/27/21:  FOTO 37 (goal is 44)     MUSCLE LENGTH: Hamstrings limited by 25-50% with pain    POSTURE: rounded shoulders, forward head, and flexed trunk    PALPATION: Diffuse palpable tenderness over bil lumbar paraspinals and gluteals    LUMBAR ROM:    Not tested due to balance deficits.    LOWER EXTREMITY ROM:      Limited by 25-50% bilaterally with pain    LOWER EXTREMITY MMT:     Bil hips 4-/5 with pain, knees 4/5 with pain   LUMBAR SPECIAL TESTS:  Straight leg raise test: Positive and Slump test: Positive bil- due to pain with all movement    FUNCTIONAL TESTS:  08/27/21:  5x sit to stand: 21 seconds with use of hands    GAIT: Distance walked: 100 Assistive device utilized: Single point cane Level of assistance: CGA (secondary to vision) Comments: flexed trunk and antalgic gait       TODAY'S TREATMENT: 09/21/21: Pt seen for aquatic therapy today.  Treatment took place in water 3.25-4.5 ft in depth at the [place]. Temp of water was 91.  Pt entered/exited the pool via stairs with step-to pattern with supervision and bilat rail.  * walking forward / backward, side stepping, and high knee  marching - 3 laps of each, UE on white floatation barbell   * sitting on bench in water with feet on blue step :  STS x 10 with cues for ant weight shift prior to ascending and core engagement - CGA progressed to SBA;  LAQ with DF for dynamic calf stretch; hip abdct/ add; bicycle motion * Forward step ups with L, retro with R x 5, UE on wall * TUG like exercise working on upright gait with increased speed, without UE support x 4 reps    * holding wall:  heel/toe raises x 10;  squats x 10; hip ext x 10; hip abdct x 10; Stork stance with hip IR/ER (some cramping in calf)   * bilat calf stretch with heels off of step  Pt requires buoyancy for support and to offload joints with strengthening/balance exercises. Viscosity of the water is needed for resistance of strengthening; water current perturbations provides challenge to standing balance unsupported, requiring  increased core activation.  09/19/2021: Nustep level 4 x8 minutes with PT present to discuss status Sit to stand 2x10 with emphasis on control Seated hamstring stretch 2x20 seconds  Sidelying clam: 2x5 each  Seated with 1.5# ankle weights:  heel/toe raises, marching, LAQ, hip abduction scissors.  BLE 2x10 reps Sit to/from stand x10 reps without UE use  Trigger Point Dry-Needling  Treatment instructions: Expect mild to moderate muscle soreness. S/S of pneumothorax if dry needled over a lung field, and to seek immediate medical attention should they occur. Patient verbalized understanding of these instructions and education. Patient Consent Given: Yes Education handout provided: Yes Muscles treated: bilateral lumbar multifidi Electrical stimulation performed: No Parameters: N/A Treatment response/outcome: located trigger points utilizing skilled palpation. Able to palpate muscle twitch response and muscle elongation following.  09/13/2021: Nustep level 1 x7 minutes with PT present to discuss status Seated with 1.5# ankle weights:   heel/toe raises, marching, LAQ, hip abduction scissors.  BLE 2x10 reps Seated 3 way green pball rollout 5 x 10 sec Sit to/from stand x10 reps without UE use Seated core series with yellow wt ball:  hip to hip, hip to shoulder, shoulder to shoulder.  X10 bilat Trigger Point Dry-Needling  Treatment instructions: Expect mild to moderate muscle soreness. S/S of pneumothorax if dry needled over a lung field, and to seek immediate medical attention should they occur. Patient verbalized understanding of these instructions and education. Patient Consent Given: Yes Education handout provided: Yes Muscles treated: bilateral lumbar multifidi Electrical stimulation performed: No Parameters: N/A Treatment response/outcome: located trigger points utilizing skilled palpation. Able to palpate muscle twitch response and muscle elongation following.     PATIENT EDUCATION:  Education details: Access Code: [email or web address] Person educated: Patient Education method: Consulting civil engineer, Media planner, and Handouts Education comprehension: verbalized understanding and returned demonstration     HOME EXERCISE PROGRAM: Access Code: [email or web address] URL: [email or web address] Date: 09/19/2021 Prepared by: Claiborne Billings  Exercises - Supine Lower Trunk Rotation  - 3 x daily - 7 x weekly - 1 sets - 3 reps - 20 hold - Hooklying Single Knee to Chest  - 3 x daily - 7 x weekly - 1 sets - 3 reps - 20 hold - Seated Hamstring Stretch  - 3 x daily - 7 x weekly - 1 sets - 3 reps - 20 hold - Sit to Stand Without Arm Support  - 2 x daily - 7 x weekly - 2 sets - 5 reps - Clamshell  - 2 x daily - 7 x weekly - 2 sets - 5 reps   ASSESSMENT:   CLINICAL IMPRESSION: Pt demonstrates decreased flexibility in Lt calf and hamstring and decreased balance in R SLS requiring UE support on wall. Requires minor cues for more upright posture throughout.  Due to visual impairments  pt requires CGA to SBA from therapist in the water. UE on floatation  device (noodle/barbell) assisted with pt's balance. She continues to report elimination of back pain while submerged 50-75%.   Pt continues to require skilled PT to progress towards goal related activities. Pt continues to require skilled PT to progress towards goal related activities.     OBJECTIVE IMPAIRMENTS Abnormal gait, decreased activity tolerance, decreased strength, increased muscle spasms, impaired flexibility, postural dysfunction, and pain.    ACTIVITY LIMITATIONS carrying, lifting, standing, squatting, and stairs   PARTICIPATION LIMITATIONS: meal prep, cleaning, laundry, and community activity   PERSONAL FACTORS Age and 1-2 comorbidities: chronic LBP, lumbar fusion, legally blind  are also affecting patient's functional outcome.  REHAB POTENTIAL: Good   CLINICAL DECISION MAKING: Evolving/moderate complexity   EVALUATION COMPLEXITY: Moderate     GOALS: Goals reviewed with patient? Yes   SHORT TERM GOALS: Target date: 09/24/2021   Be independent in initial HEP Baseline: Goal status: MET   2.  Report > or = to 25% reduction in LBP with standing and walking Baseline: 25% reported (09/19/21) Goal status: MET   3.  Verbalize and demonstrate postural corrections to improve standing posture for ADLs and gait  Baseline: flexed trunk in standing  Goal status: INITIAL       LONG TERM GOALS: Target date: 10/22/2021   Be independent in advanced HEP Baseline:  Goal status: INITIAL   2.  Increase FOTO to > or = to 44 Baseline: 37 Goal status: INITIAL   3.  Report > or = to 50% reduction in LBP with standing and walking  Baseline: 6-7/10 Goal status: INITIAL   4.  Demonstrate erect standing posture due to improved endurance and strength > or = to 50% of the time Baseline:  Goal status: INITIAL   5.  Perform 5x sit to stand in < or = to 13 seconds to improve balance and mobility  Baseline: 21 seconds  Goal status: INITIAL         PLAN: PT FREQUENCY:  1-2x/week   PT DURATION: 8 weeks   PLANNED INTERVENTIONS: Therapeutic exercises, Therapeutic activity, Neuromuscular re-education, Balance training, Gait training, Patient/Family education, Joint mobilization, Stair training, Aquatic Therapy, Dry Needling, Electrical stimulation, Spinal manipulation, Spinal mobilization, Cryotherapy, Moist heat, Taping, and Manual therapy.   PLAN FOR NEXT SESSION: Review new HEP, Continue functional and core strength, flexibility and DN/manual therapy as needed

## 2021-09-24 ENCOUNTER — Encounter: Payer: Self-pay | Admitting: Rehabilitative and Restorative Service Providers"

## 2021-09-24 ENCOUNTER — Other Ambulatory Visit: Payer: Self-pay | Admitting: Family Medicine

## 2021-09-24 ENCOUNTER — Ambulatory Visit: Payer: Medicare HMO | Admitting: Rehabilitative and Restorative Service Providers"

## 2021-09-24 ENCOUNTER — Telehealth: Payer: Self-pay

## 2021-09-24 DIAGNOSIS — M6281 Muscle weakness (generalized): Secondary | ICD-10-CM | POA: Diagnosis not present

## 2021-09-24 DIAGNOSIS — Z1231 Encounter for screening mammogram for malignant neoplasm of breast: Secondary | ICD-10-CM

## 2021-09-24 DIAGNOSIS — R2689 Other abnormalities of gait and mobility: Secondary | ICD-10-CM

## 2021-09-24 DIAGNOSIS — M5459 Other low back pain: Secondary | ICD-10-CM

## 2021-09-24 NOTE — Therapy (Signed)
OUTPATIENT PHYSICAL THERAPY TREATMENT NOTE   Patient Name: Emily Peck MRN: [id number] DOB:05-Sep-1955, 66 y.o., female Today's Date: 09/24/2021  PCP: Darrol Jump, NP REFERRING PROVIDER: Alysia Penna,  MD  END OF SESSION:   PT End of Session - 09/24/21 1019     Visit Number 7    Date for PT Re-Evaluation 10/22/21    Authorization Type [employer]    Authorization Time Period 08/27/21-10/22/21    Authorization - Visit Number 7    Authorization - Number of Visits 12    Progress Note Due on Visit 10    PT Start Time 6811    PT Stop Time 1055    PT Time Calculation (min) 40 min    Activity Tolerance Patient tolerated treatment well    Behavior During Therapy WFL for tasks assessed/performed               Past Medical History:  Diagnosis Date   Anxiety    Clotting disorder ([place])    Depression    Dyspnea    GERD (gastroesophageal reflux disease)    Headache    Lumbosacral spondylosis without myelopathy    Neuropathy    Postlaminectomy syndrome, lumbar region    Postlaminectomy syndrome, thoracic region    Stroke [facility])    Past Surgical History:  Procedure Laterality Date   ABDOMINAL HYSTERECTOMY     BACK SURGERY     BIOPSY  10/02/2020   Procedure: BIOPSY;  Surgeon: Juanita Craver, MD;  Location: [facility];  Service: Endoscopy;;   COLONOSCOPY WITH PROPOFOL N/A 10/02/2020   Procedure: COLONOSCOPY WITH PROPOFOL;  Surgeon: Juanita Craver, MD;  Location: [facility] ENDOSCOPY;  Service: Endoscopy;  Laterality: N/A;   ESOPHAGOGASTRODUODENOSCOPY (EGD) WITH PROPOFOL N/A 10/02/2020   Procedure: ESOPHAGOGASTRODUODENOSCOPY (EGD) WITH PROPOFOL;  Surgeon: Juanita Craver, MD;  Location: [facility] ENDOSCOPY;  Service: Endoscopy;  Laterality: N/A;   EYE SURGERY     eye removed right    GIVENS CAPSULE STUDY N/A 10/02/2020   Procedure: GIVENS CAPSULE STUDY;  Surgeon: Juanita Craver, MD;  Location: [facility] ENDOSCOPY;  Service: Endoscopy;  Laterality: N/A;   KNEE ARTHROSCOPY     TOTAL KNEE ARTHROPLASTY  Right 01/09/2016   Procedure: TOTAL KNEE ARTHROPLASTY;  Surgeon: Melrose Nakayama, MD;  Location: [place];  Service: Orthopedics;  Laterality: Right;   Patient Active Problem List   Diagnosis Date Noted   Iron deficiency anemia 10/23/2020   Symptomatic anemia 09/30/2020   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   Category 4 blindness of left eye 04/19/2019   Nocturia more than twice per night 04/19/2019   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   Nasal septal deviation 04/19/2019   Excessive daytime sleepiness 04/19/2019   Neuropathy 04/19/2019   Sleep-related headache 04/19/2019   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[place]) 04/19/2019   Rotator cuff syndrome of both shoulders 06/09/2018   Cervicalgia 03/19/2018   Disorder of rotator cuff syndrome of right shoulder and allied disorder 01/09/2018   Primary osteoarthritis of right knee 01/09/2016   Peripheral polyneuropathy 10/19/2015   Knee pain, chronic 06/11/2013   Thoracic postlaminectomy syndrome 06/11/2013   Pes planus of both feet 06/11/2013   Trochanteric bursitis of both hips 11/06/2012   Lumbosacral spondylosis without myelopathy 05/09/2011   DEPRESSION 07/24/2006   CARPAL TUNNEL SYNDROME, RIGHT 07/24/2006   MYOPIA, 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   DEGENERATIVE JOINT DISEASE 07/24/2006   DISORDER, LUMBAR DISC W/MYELOPATHY 07/24/2006   Cerebral venous sinus thrombosis 07/24/2006   AVULSION, EYE 12/23/2005    REFERRING DIAG: M47.817 (  ICD-10-CM) - Lumbosacral spondylosis without myelopathy  THERAPY DIAG:  Muscle weakness (generalized)  Other low back pain  Other abnormalities of gait and mobility  Rationale for Evaluation and Treatment Rehabilitation  PERTINENT HISTORY: 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  Requires SBA for mobility  SUBJECTIVE: Pt reports that she is feeling better today and has  been doing her exercises.  PAIN:  Are you having pain? Yes Pain scale:  2-3/10 Location: Head Description:  Headache, dull/ sharp Aggravating factor: Light Alleviating factor: medicine  Bilat knees are painful only when WB - 6/10; 0/10 at rest  PATIENT GOALS:  reduce LBP  OBJECTIVE: (objective measures completed at initial evaluation unless otherwise dated)   DIAGNOSTIC FINDINGS:  MRI 07/02/21: Progressive disc degeneration at L2-3 with mild spinal and left neural foraminal stenosis. 2. Severe L4-5 facet arthrosis with new trace anterolisthesis. No stenosis.   PATIENT SURVEYS:  08/27/21:  FOTO 37 (goal is 44)     MUSCLE LENGTH: Hamstrings limited by 25-50% with pain    POSTURE: rounded shoulders, forward head, and flexed trunk    PALPATION: Diffuse palpable tenderness over bil lumbar paraspinals and gluteals    LUMBAR ROM:    Not tested due to balance deficits.    LOWER EXTREMITY ROM:      Limited by 25-50% bilaterally with pain    LOWER EXTREMITY MMT:     Bil hips 4-/5 with pain, knees 4/5 with pain   LUMBAR SPECIAL TESTS:  Straight leg raise test: Positive and Slump test: Positive bil- due to pain with all movement    FUNCTIONAL TESTS:  08/27/21:  5x sit to stand: 21 seconds with use of hands    GAIT: Distance walked: 100 Assistive device utilized: Single point cane Level of assistance: CGA (secondary to vision) Comments: flexed trunk and antalgic gait       TODAY'S TREATMENT:  09/24/2021: Nustep level 5 (green machine) x8 minutes with PT present to discuss status Seated with 2# ankle weights:  heel/toe raises, marching, LAQ, hip abduction scissors.  BLE 2x10 reps Sit to stand 2x10 with emphasis on control Seated hamstring stretch 2x20 seconds  Lower trunk rotation 5x10 sec bilat Hooklying single knee to chest 3x10 bilat Sidelying clamshell 2x10  bilat Prone press-ups x10, Prone hip extension x10 bilat  09/21/21: Pt seen for aquatic therapy today.   Treatment took place in water 3.25-4.5 ft in depth at the [facility]. Temp of water was 91.  Pt entered/exited the pool via stairs with step-to pattern with supervision and bilat rail.  * walking forward / backward, side stepping, and high knee marching - 3 laps of each, UE on white floatation barbell   * sitting on bench in water with feet on blue step :  STS x 10 with cues for ant weight shift prior to ascending and core engagement - CGA progressed to SBA;  LAQ with DF for dynamic calf stretch; hip abdct/ add; bicycle motion * Forward step ups with L, retro with R x 5, UE on wall * TUG like exercise working on upright gait with increased speed, without UE support x 4 reps    * holding wall:  heel/toe raises x 10;  squats x 10; hip ext x 10; hip abdct x 10; Stork stance with hip IR/ER (some cramping in calf)   * bilat calf stretch with heels off of step  Pt requires buoyancy for support and to offload joints with strengthening/balance exercises. Viscosity of the water is needed for resistance of strengthening; water current perturbations provides challenge to standing balance unsupported, requiring increased core activation.  09/19/2021: Nustep level 4 x8 minutes with PT present to discuss status Sit to stand 2x10 with emphasis on control Seated hamstring stretch 2x20 seconds  Sidelying clam: 2x5 each  Seated with 1.5# ankle weights:  heel/toe raises, marching, LAQ, hip abduction scissors.  BLE 2x10 reps Sit to/from stand x10 reps without UE use  Trigger Point Dry-Needling  Treatment instructions: Expect mild to moderate muscle soreness. S/S of pneumothorax if dry needled over a lung field, and to seek immediate medical attention should they occur. Patient verbalized understanding of these instructions and education. Patient Consent Given: Yes Education handout provided: Yes Muscles treated: bilateral lumbar multifidi Electrical stimulation performed: No Parameters:  N/A Treatment response/outcome: located trigger points utilizing skilled palpation. Able to palpate muscle twitch response and muscle elongation following.     PATIENT EDUCATION:  Education details: Access Code: [email or web address] Person educated: Patient Education method: Consulting civil engineer, Media planner, and Handouts Education comprehension: verbalized understanding and returned demonstration     HOME EXERCISE PROGRAM: Access Code: [email or web address] URL: [email or web address] Date: 09/19/2021 Prepared by: Claiborne Billings  Exercises - Supine Lower Trunk Rotation  - 3 x daily - 7 x weekly - 1 sets - 3 reps - 20 hold - Hooklying Single Knee to Chest  - 3 x daily - 7 x weekly - 1 sets - 3 reps - 20 hold - Seated Hamstring Stretch  - 3 x daily - 7 x weekly - 1 sets - 3 reps - 20 hold - Sit to Stand Without Arm Support  - 2 x daily - 7 x weekly - 2 sets - 5 reps - Clamshell  - 2 x daily - 7 x weekly - 2 sets - 5 reps   ASSESSMENT:   CLINICAL IMPRESSION: Ms Fesler presents to skilled rehabilitation reporting that she can tell that she is feeling better overall.  Pt able to progress with exercises during session today and able to complete new therex with minimal cuing for improved technique.  Will dry needle next visit, as pt wants to perform this the 2nd visit of the week. Pt continues to require skilled PT to progress towards goal related activities.     OBJECTIVE IMPAIRMENTS Abnormal gait, decreased activity tolerance, decreased strength, increased muscle spasms, impaired flexibility, postural dysfunction, and pain.    ACTIVITY LIMITATIONS carrying, lifting, standing, squatting, and stairs   PARTICIPATION LIMITATIONS: meal prep, cleaning, laundry, and community activity   PERSONAL FACTORS Age and 1-2 comorbidities: chronic LBP, lumbar fusion, legally blind  are also affecting patient's functional outcome.    REHAB POTENTIAL: Good   CLINICAL DECISION MAKING: Evolving/moderate complexity    EVALUATION COMPLEXITY: Moderate     GOALS: Goals reviewed with patient? Yes   SHORT TERM GOALS: Target date: 09/24/2021   Be independent in initial HEP Baseline: Goal status: MET   2.  Report > or = to 25% reduction in LBP with standing and walking Baseline: 25% reported (09/19/21) Goal status: MET   3.  Verbalize and demonstrate postural corrections to improve standing posture for ADLs and gait  Baseline: flexed trunk in standing  Goal status: INITIAL       LONG TERM GOALS: Target date: 10/22/2021   Be independent in advanced HEP Baseline:  Goal status: INITIAL   2.  Increase FOTO to > or = to  44 Baseline: 37 Goal status: INITIAL   3.  Report > or = to 50% reduction in LBP with standing and walking  Baseline: 6-7/10 Goal status: INITIAL   4.  Demonstrate erect standing posture due to improved endurance and strength > or = to 50% of the time Baseline:  Goal status: INITIAL   5.  Perform 5x sit to stand in < or = to 13 seconds to improve balance and mobility  Baseline: 21 seconds  Goal status: INITIAL         PLAN: PT FREQUENCY: 1-2x/week   PT DURATION: 8 weeks   PLANNED INTERVENTIONS: Therapeutic exercises, Therapeutic activity, Neuromuscular re-education, Balance training, Gait training, Patient/Family education, Joint mobilization, Stair training, Aquatic Therapy, Dry Needling, Electrical stimulation, Spinal manipulation, Spinal mobilization, Cryotherapy, Moist heat, Taping, and Manual therapy.   PLAN FOR NEXT SESSION: Review new HEP, Continue functional and core strength, flexibility and DN/manual therapy as needed

## 2021-09-24 NOTE — Telephone Encounter (Signed)
Patient is calling for a refill on Hydrocodone. Per PMP, last fill was 08/28/21

## 2021-09-25 MED ORDER — HYDROCODONE-ACETAMINOPHEN 5-325 MG PO TABS: 1.0000 | ORAL_TABLET | Freq: Two times a day (BID) | ORAL | 0 refills | Status: DC | PRN

## 2021-09-25 NOTE — Addendum Note (Signed)
Addended by: Bayard Hugger on: 09/25/2021 08:27 AM   Modules accepted: Orders

## 2021-09-25 NOTE — Telephone Encounter (Addendum)
PMP was Reviewed:  Dr Letta Pate note was reviewed. Hydrocodone e-scribed today.  My Chart message sent to Ms. Benzel regarding the medication refill.

## 2021-09-26 ENCOUNTER — Encounter: Payer: Medicare HMO | Admitting: Rehabilitative and Restorative Service Providers"

## 2021-10-02 ENCOUNTER — Encounter (HOSPITAL_BASED_OUTPATIENT_CLINIC_OR_DEPARTMENT_OTHER): Payer: Self-pay | Admitting: Physical Therapy

## 2021-10-02 ENCOUNTER — Ambulatory Visit (HOSPITAL_BASED_OUTPATIENT_CLINIC_OR_DEPARTMENT_OTHER): Payer: Medicare HMO | Attending: Physical Medicine & Rehabilitation | Admitting: Physical Therapy

## 2021-10-02 DIAGNOSIS — R2689 Other abnormalities of gait and mobility: Secondary | ICD-10-CM | POA: Diagnosis present

## 2021-10-02 DIAGNOSIS — M6281 Muscle weakness (generalized): Secondary | ICD-10-CM | POA: Diagnosis present

## 2021-10-02 DIAGNOSIS — M5459 Other low back pain: Secondary | ICD-10-CM | POA: Insufficient documentation

## 2021-10-02 NOTE — Therapy (Signed)
OUTPATIENT PHYSICAL THERAPY TREATMENT NOTE   Patient Name: Emily Peck MRN: [id number] DOB:04/24/1955, 66 y.o., female Today's Date: 10/02/2021  PCP: Darrol Jump, NP REFERRING PROVIDER: Alysia Penna,  MD  END OF SESSION:   PT End of Session - 10/02/21 0913     Visit Number 8    Date for PT Re-Evaluation 10/22/21    Authorization Type [employer]    Authorization Time Period 08/27/21-10/22/21    Authorization - Visit Number 8    Authorization - Number of Visits 12    Progress Note Due on Visit 10    PT Start Time 0900    PT Stop Time 0939    PT Time Calculation (min) 39 min    Activity Tolerance Patient tolerated treatment well    Behavior During Therapy WFL for tasks assessed/performed               Past Medical History:  Diagnosis Date   Anxiety    Clotting disorder ([place])    Depression    Dyspnea    GERD (gastroesophageal reflux disease)    Headache    Lumbosacral spondylosis without myelopathy    Neuropathy    Postlaminectomy syndrome, lumbar region    Postlaminectomy syndrome, thoracic region    Stroke [facility])    Past Surgical History:  Procedure Laterality Date   ABDOMINAL HYSTERECTOMY     BACK SURGERY     BIOPSY  10/02/2020   Procedure: BIOPSY;  Surgeon: Juanita Craver, MD;  Location: [place];  Service: Endoscopy;;   COLONOSCOPY WITH PROPOFOL N/A 10/02/2020   Procedure: COLONOSCOPY WITH PROPOFOL;  Surgeon: Juanita Craver, MD;  Location: [facility] ENDOSCOPY;  Service: Endoscopy;  Laterality: N/A;   ESOPHAGOGASTRODUODENOSCOPY (EGD) WITH PROPOFOL N/A 10/02/2020   Procedure: ESOPHAGOGASTRODUODENOSCOPY (EGD) WITH PROPOFOL;  Surgeon: Juanita Craver, MD;  Location: [facility] ENDOSCOPY;  Service: Endoscopy;  Laterality: N/A;   EYE SURGERY     eye removed right    GIVENS CAPSULE STUDY N/A 10/02/2020   Procedure: GIVENS CAPSULE STUDY;  Surgeon: Juanita Craver, MD;  Location: [facility] ENDOSCOPY;  Service: Endoscopy;  Laterality: N/A;   KNEE ARTHROSCOPY     TOTAL KNEE ARTHROPLASTY Right  01/09/2016   Procedure: TOTAL KNEE ARTHROPLASTY;  Surgeon: Melrose Nakayama, MD;  Location: [place];  Service: Orthopedics;  Laterality: Right;   Patient Active Problem List   Diagnosis Date Noted   Iron deficiency anemia 10/23/2020   Symptomatic anemia 09/30/2020   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   Category 4 blindness of left eye 04/19/2019   Nocturia more than twice per night 04/19/2019   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   Nasal septal deviation 04/19/2019   Excessive daytime sleepiness 04/19/2019   Neuropathy 04/19/2019   Sleep-related headache 04/19/2019   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[place]) 04/19/2019   Rotator cuff syndrome of both shoulders 06/09/2018   Cervicalgia 03/19/2018   Disorder of rotator cuff syndrome of right shoulder and allied disorder 01/09/2018   Primary osteoarthritis of right knee 01/09/2016   Peripheral polyneuropathy 10/19/2015   Knee pain, chronic 06/11/2013   Thoracic postlaminectomy syndrome 06/11/2013   Pes planus of both feet 06/11/2013   Trochanteric bursitis of both hips 11/06/2012   Lumbosacral spondylosis without myelopathy 05/09/2011   DEPRESSION 07/24/2006   CARPAL TUNNEL SYNDROME, RIGHT 07/24/2006   MYOPIA, 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   DEGENERATIVE JOINT DISEASE 07/24/2006   DISORDER, LUMBAR DISC W/MYELOPATHY 07/24/2006   Cerebral venous sinus thrombosis 07/24/2006   AVULSION, EYE 12/23/2005    REFERRING DIAG: M47.817 (  ICD-10-CM) - Lumbosacral spondylosis without myelopathy  THERAPY DIAG:  Muscle weakness (generalized)  Other low back pain  Other abnormalities of gait and mobility  Rationale for Evaluation and Treatment Rehabilitation  PERTINENT HISTORY: 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  Requires SBA for mobility  SUBJECTIVE: Pt reports she had a fall in parking lot one week ago;  required help of friend to get back up.  "I'm so thankful I didn't have any bruises.  But I was so sore in both wrists and ankles I had to cancel PT".   PAIN:  Are you having pain? Yes Pain scale:  4/10 Location: Lower back  Description:  achy Aggravating factor: riding in [employer] transportation  Alleviating factor: laying in bed   Bilat knees are painful only when WB - 6/10; 0/10 at rest  PATIENT GOALS:  reduce LBP  OBJECTIVE: (objective measures completed at initial evaluation unless otherwise dated)   DIAGNOSTIC FINDINGS:  MRI 07/02/21: Progressive disc degeneration at L2-3 with mild spinal and left neural foraminal stenosis. 2. Severe L4-5 facet arthrosis with new trace anterolisthesis. No stenosis.   PATIENT SURVEYS:  08/27/21:  FOTO 37 (goal is 44)     MUSCLE LENGTH: Hamstrings limited by 25-50% with pain    POSTURE: rounded shoulders, forward head, and flexed trunk    PALPATION: Diffuse palpable tenderness over bil lumbar paraspinals and gluteals    LUMBAR ROM:    Not tested due to balance deficits.    LOWER EXTREMITY ROM:      Limited by 25-50% bilaterally with pain    LOWER EXTREMITY MMT:     Bil hips 4-/5 with pain, knees 4/5 with pain   LUMBAR SPECIAL TESTS:  Straight leg raise test: Positive and Slump test: Positive bil- due to pain with all movement    FUNCTIONAL TESTS:  08/27/21:  5x sit to stand: 21 seconds with use of hands    GAIT: Distance walked: 100 Assistive device utilized: Single point cane Level of assistance: CGA (secondary to vision) Comments: flexed trunk and antalgic gait       TODAY'S TREATMENT: 10/02/21:  Pt seen for aquatic therapy today.  Treatment took place in water 3.25-4.5 ft in depth at the [facility]. Temp of water was 91.  Pt entered/exited the pool via stairs with step-to pattern with supervision and bilat rail.  * walking forward / backward, side stepping, and high knee marching - 3 laps of each, UE on  white floatation barbell  * Holding wall:  heel/toe raises x 10;  squats x 10 x 2; hip ext x 10; hip abdct x 10; SLS with 2 fingers x 10 sec x 3 each LE * sitting on bench in water:  TUG like exercise working on upright gait with increased speed, without UE support x 4 reps  *Back against wall:  ab set with blue short noodle pull down to lap x 10, x 2 * return to walking with white floatation barbell at 4 ft    * bilat calf stretch with heels off of step x 15s x 2   * R/L hamstring stretch with foot on 2nd step x 3 reps of 15s RLE, x 2 on LLE  Pt requires buoyancy for support and to offload joints with strengthening/balance exercises. Viscosity of the water is needed for resistance of strengthening; water current perturbations provides challenge to standing balance unsupported,  requiring increased core activation.  09/24/2021: Nustep level 5 (green machine) x8 minutes with PT present to discuss status Seated with 2# ankle weights:  heel/toe raises, marching, LAQ, hip abduction scissors.  BLE 2x10 reps Sit to stand 2x10 with emphasis on control Seated hamstring stretch 2x20 seconds  Lower trunk rotation 5x10 sec bilat Hooklying single knee to chest 3x10 bilat Sidelying clamshell 2x10  bilat Prone press-ups x10, Prone hip extension x10 bilat  09/21/21: Pt seen for aquatic therapy today.  Treatment took place in water 3.25-4.5 ft in depth at the [facility]. Temp of water was 91.  Pt entered/exited the pool via stairs with step-to pattern with supervision and bilat rail.  * walking forward / backward, side stepping, and high knee marching - 3 laps of each, UE on white floatation barbell   * sitting on bench in water with feet on blue step :  STS x 10 with cues for ant weight shift prior to ascending and core engagement - CGA progressed to SBA;  LAQ with DF for dynamic calf stretch; hip abdct/ add; bicycle motion * Forward step ups with L, retro with R x 5, UE on wall * TUG like  exercise working on upright gait with increased speed, without UE support x 4 reps    * holding wall:  heel/toe raises x 10;  squats x 10; hip ext x 10; hip abdct x 10; Stork stance with hip IR/ER (some cramping in calf)   * bilat calf stretch with heels off of step  Pt requires buoyancy for support and to offload joints with strengthening/balance exercises. Viscosity of the water is needed for resistance of strengthening; water current perturbations provides challenge to standing balance unsupported, requiring increased core activation.  09/19/2021: Nustep level 4 x8 minutes with PT present to discuss status Sit to stand 2x10 with emphasis on control Seated hamstring stretch 2x20 seconds  Sidelying clam: 2x5 each  Seated with 1.5# ankle weights:  heel/toe raises, marching, LAQ, hip abduction scissors.  BLE 2x10 reps Sit to/from stand x10 reps without UE use  Trigger Point Dry-Needling  Treatment instructions: Expect mild to moderate muscle soreness. S/S of pneumothorax if dry needled over a lung field, and to seek immediate medical attention should they occur. Patient verbalized understanding of these instructions and education. Patient Consent Given: Yes Education handout provided: Yes Muscles treated: bilateral lumbar multifidi Electrical stimulation performed: No Parameters: N/A Treatment response/outcome: located trigger points utilizing skilled palpation. Able to palpate muscle twitch response and muscle elongation following.     PATIENT EDUCATION:  Education details: Access Code: [email or web address] Person educated: Patient Education method: Consulting civil engineer, Media planner, and Handouts Education comprehension: verbalized understanding and returned demonstration     HOME EXERCISE PROGRAM: Access Code: [email or web address] URL: [email or web address] Date: 09/19/2021 Prepared by: Claiborne Billings  Exercises - Supine Lower Trunk Rotation  - 3 x daily - 7 x weekly - 1 sets - 3 reps - 20 hold -  Hooklying Single Knee to Chest  - 3 x daily - 7 x weekly - 1 sets - 3 reps - 20 hold - Seated Hamstring Stretch  - 3 x daily - 7 x weekly - 1 sets - 3 reps - 20 hold - Sit to Stand Without Arm Support  - 2 x daily - 7 x weekly - 2 sets - 5 reps - Clamshell  - 2 x daily - 7 x weekly - 2 sets - 5 reps   ASSESSMENT:   CLINICAL IMPRESSION: Ms Milan presents  with increased soreness/ stiffness from recent fall.  She reports significant reduction of pain while exercising in water; no increase in pain.  R hamstring tighter than LLE and decreased balance on RLE in SLS.  She was able to complete 2nd half of session in water with therapist on deck guiding her, without any LOB.  Pt continues to require skilled PT to progress towards goal related activities.     OBJECTIVE IMPAIRMENTS Abnormal gait, decreased activity tolerance, decreased strength, increased muscle spasms, impaired flexibility, postural dysfunction, and pain.    ACTIVITY LIMITATIONS carrying, lifting, standing, squatting, and stairs   PARTICIPATION LIMITATIONS: meal prep, cleaning, laundry, and community activity   PERSONAL FACTORS Age and 1-2 comorbidities: chronic LBP, lumbar fusion, legally blind  are also affecting patient's functional outcome.    REHAB POTENTIAL: Good   CLINICAL DECISION MAKING: Evolving/moderate complexity   EVALUATION COMPLEXITY: Moderate     GOALS: Goals reviewed with patient? Yes   SHORT TERM GOALS: Target date: 09/24/2021   Be independent in initial HEP Baseline: Goal status: MET   2.  Report > or = to 25% reduction in LBP with standing and walking Baseline: 25% reported (09/19/21) Goal status: MET   3.  Verbalize and demonstrate postural corrections to improve standing posture for ADLs and gait  Baseline: flexed trunk in standing  Goal status: INITIAL       LONG TERM GOALS: Target date: 10/22/2021   Be independent in advanced HEP Baseline:  Goal status: INITIAL   2.  Increase FOTO to >  or = to 44 Baseline: 37 Goal status: INITIAL   3.  Report > or = to 50% reduction in LBP with standing and walking  Baseline: 6-7/10 Goal status: INITIAL   4.  Demonstrate erect standing posture due to improved endurance and strength > or = to 50% of the time Baseline:  Goal status: INITIAL   5.  Perform 5x sit to stand in < or = to 13 seconds to improve balance and mobility  Baseline: 21 seconds  Goal status: INITIAL         PLAN: PT FREQUENCY: 1-2x/week   PT DURATION: 8 weeks   PLANNED INTERVENTIONS: Therapeutic exercises, Therapeutic activity, Neuromuscular re-education, Balance training, Gait training, Patient/Family education, Joint mobilization, Stair training, Aquatic Therapy, Dry Needling, Electrical stimulation, Spinal manipulation, Spinal mobilization, Cryotherapy, Moist heat, Taping, and Manual therapy.   PLAN FOR NEXT SESSION: Review new HEP, Continue functional and core strength, flexibility and DN/manual therapy as needed  Kerin Perna, PTA 10/02/21 9:47 AM

## 2021-10-03 ENCOUNTER — Ambulatory Visit: Payer: Medicare HMO

## 2021-10-04 ENCOUNTER — Ambulatory Visit
Payer: Medicare HMO | Attending: Physical Medicine & Rehabilitation | Admitting: Rehabilitative and Restorative Service Providers"

## 2021-10-04 ENCOUNTER — Encounter: Payer: Self-pay | Admitting: Rehabilitative and Restorative Service Providers"

## 2021-10-04 DIAGNOSIS — M5459 Other low back pain: Secondary | ICD-10-CM | POA: Insufficient documentation

## 2021-10-04 DIAGNOSIS — R2689 Other abnormalities of gait and mobility: Secondary | ICD-10-CM | POA: Insufficient documentation

## 2021-10-04 DIAGNOSIS — M6281 Muscle weakness (generalized): Secondary | ICD-10-CM | POA: Insufficient documentation

## 2021-10-04 NOTE — Therapy (Signed)
OUTPATIENT PHYSICAL THERAPY TREATMENT NOTE   Patient Name: Emily Peck MRN: [id number] DOB:1955-06-17, 66 y.o., female Today's Date: 10/04/2021  PCP: Darrol Jump, NP REFERRING PROVIDER: Alysia Penna,  MD  END OF SESSION:   PT End of Session - 10/04/21 1035     Visit Number 9    Date for PT Re-Evaluation 10/22/21    Authorization Type [employer]    Authorization Time Period 08/27/21-10/22/21    Authorization - Visit Number 9    Authorization - Number of Visits 12    Progress Note Due on Visit 10    PT Start Time 2229    PT Stop Time 1030    PT Time Calculation (min) 15 min    Activity Tolerance Patient tolerated treatment well    Behavior During Therapy WFL for tasks assessed/performed               Past Medical History:  Diagnosis Date   Anxiety    Clotting disorder ([place])    Depression    Dyspnea    GERD (gastroesophageal reflux disease)    Headache    Lumbosacral spondylosis without myelopathy    Neuropathy    Postlaminectomy syndrome, lumbar region    Postlaminectomy syndrome, thoracic region    Stroke [facility])    Past Surgical History:  Procedure Laterality Date   ABDOMINAL HYSTERECTOMY     BACK SURGERY     BIOPSY  10/02/2020   Procedure: BIOPSY;  Surgeon: Juanita Craver, MD;  Location: [place];  Service: Endoscopy;;   COLONOSCOPY WITH PROPOFOL N/A 10/02/2020   Procedure: COLONOSCOPY WITH PROPOFOL;  Surgeon: Juanita Craver, MD;  Location: [facility] ENDOSCOPY;  Service: Endoscopy;  Laterality: N/A;   ESOPHAGOGASTRODUODENOSCOPY (EGD) WITH PROPOFOL N/A 10/02/2020   Procedure: ESOPHAGOGASTRODUODENOSCOPY (EGD) WITH PROPOFOL;  Surgeon: Juanita Craver, MD;  Location[facility] ENDOSCOPY;  Service: Endoscopy;  Laterality: N/A;   EYE SURGERY     eye removed right    GIVENS CAPSULE STUDY N/A 10/02/2020   Procedure: GIVENS CAPSULE STUDY;  Surgeon: Juanita Craver, MD;  Location: [facility] ENDOSCOPY;  Service: Endoscopy;  Laterality: N/A;   KNEE ARTHROSCOPY     TOTAL KNEE ARTHROPLASTY Right  01/09/2016   Procedure: TOTAL KNEE ARTHROPLASTY;  Surgeon: Melrose Nakayama, MD;  Location: [place];  Service: Orthopedics;  Laterality: Right;   Patient Active Problem List   Diagnosis Date Noted   Iron deficiency anemia 10/23/2020   Symptomatic anemia 09/30/2020   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   Category 4 blindness of left eye 04/19/2019   Nocturia more than twice per night 04/19/2019   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   Nasal septal deviation 04/19/2019   Excessive daytime sleepiness 04/19/2019   Neuropathy 04/19/2019   Sleep-related headache 04/19/2019   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[place]) 04/19/2019   Rotator cuff syndrome of both shoulders 06/09/2018   Cervicalgia 03/19/2018   Disorder of rotator cuff syndrome of right shoulder and allied disorder 01/09/2018   Primary osteoarthritis of right knee 01/09/2016   Peripheral polyneuropathy 10/19/2015   Knee pain, chronic 06/11/2013   Thoracic postlaminectomy syndrome 06/11/2013   Pes planus of both feet 06/11/2013   Trochanteric bursitis of both hips 11/06/2012   Lumbosacral spondylosis without myelopathy 05/09/2011   DEPRESSION 07/24/2006   CARPAL TUNNEL SYNDROME, RIGHT 07/24/2006   MYOPIA, 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   DEGENERATIVE JOINT DISEASE 07/24/2006   DISORDER, LUMBAR DISC W/MYELOPATHY 07/24/2006   Cerebral venous sinus thrombosis 07/24/2006   AVULSION, EYE 12/23/2005    REFERRING DIAG: M47.817 (  ICD-10-CM) - Lumbosacral spondylosis without myelopathy  THERAPY DIAG:  Muscle weakness (generalized)  Other low back pain  Other abnormalities of gait and mobility  Rationale for Evaluation and Treatment Rehabilitation  PERTINENT HISTORY: 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  Requires SBA for mobility  SUBJECTIVE: Pt reports that she fell this morning and hit her head.   She also expressed her excitement to getting her new eye prosthetic.  PAIN:  Are you having pain? Yes Pain scale:  7/10 Location: Lower back  Description:  achy Aggravating factor: riding in [employer] transportation  Alleviating factor: laying in bed   Bilat knees are painful only when WB - 6/10; 0/10 at rest  PATIENT GOALS:  reduce LBP  OBJECTIVE: (objective measures completed at initial evaluation unless otherwise dated)   DIAGNOSTIC FINDINGS:  MRI 07/02/21: Progressive disc degeneration at L2-3 with mild spinal and left neural foraminal stenosis. 2. Severe L4-5 facet arthrosis with new trace anterolisthesis. No stenosis.   PATIENT SURVEYS:  08/27/21:  FOTO 37 (goal is 44)     MUSCLE LENGTH: Hamstrings limited by 25-50% with pain    POSTURE: rounded shoulders, forward head, and flexed trunk    PALPATION: Diffuse palpable tenderness over bil lumbar paraspinals and gluteals    LUMBAR ROM:    Not tested due to balance deficits.    LOWER EXTREMITY ROM:      Limited by 25-50% bilaterally with pain    LOWER EXTREMITY MMT:     Bil hips 4-/5 with pain, knees 4/5 with pain   LUMBAR SPECIAL TESTS:  Straight leg raise test: Positive and Slump test: Positive bil- due to pain with all movement    FUNCTIONAL TESTS:  08/27/21:  5x sit to stand: 21 seconds with use of hands    GAIT: Distance walked: 100 Assistive device utilized: Single point cane Level of assistance: CGA (secondary to vision) Comments: flexed trunk and antalgic gait       TODAY'S TREATMENT: 10/04/2021: Pt arrived to PT clinic stating that she is in more pain today because she had a "bad fall" this morning.  Pt reports that she hit her head and other parts of her body and is having increased pain.  Educated pt that due to her being on Coumadin, she would need to be assessed by a doctor prior to performing any exercises or dry needling.  Pt placed call to provider to notify and they stated that they would call  her back to advise.  Educated pt on fall risk prevention strategies such as using her walking stick at all times, even when at home.  Educated pt that when she was talking on the phone, she needs to be stationary and not trying to multitask.  Pt educated to be careful with her dogs and with placement of the dog gate.  Pt verbalized understanding.  10/02/21:  Pt seen for aquatic therapy today.  Treatment took place in water 3.25-4.5 ft in depth at the [facility]. Temp of water was 91.  Pt entered/exited the pool via stairs with step-to pattern with supervision and bilat rail.  * walking forward / backward, side stepping, and high knee marching - 3 laps of each, UE on white floatation barbell  * Holding wall:  heel/toe raises x 10;  squats x 10 x 2; hip ext x 10; hip abdct x 10; SLS with 2 fingers x 10  sec x 3 each LE * sitting on bench in water:  TUG like exercise working on upright gait with increased speed, without UE support x 4 reps  *Back against wall:  ab set with blue short noodle pull down to lap x 10, x 2 * return to walking with white floatation barbell at 4 ft    * bilat calf stretch with heels off of step x 15s x 2   * R/L hamstring stretch with foot on 2nd step x 3 reps of 15s RLE, x 2 on LLE  Pt requires buoyancy for support and to offload joints with strengthening/balance exercises. Viscosity of the water is needed for resistance of strengthening; water current perturbations provides challenge to standing balance unsupported, requiring increased core activation.  09/24/2021: Nustep level 5 (green machine) x8 minutes with PT present to discuss status Seated with 2# ankle weights:  heel/toe raises, marching, LAQ, hip abduction scissors.  BLE 2x10 reps Sit to stand 2x10 with emphasis on control Seated hamstring stretch 2x20 seconds  Lower trunk rotation 5x10 sec bilat Hooklying single knee to chest 3x10 bilat Sidelying clamshell 2x10  bilat Prone press-ups x10, Prone  hip extension x10 bilat     PATIENT EDUCATION:  Education details: Access Code: [email or web address] Person educated: Patient Education method: Consulting civil engineer, Media planner, and Handouts Education comprehension: verbalized understanding and returned demonstration     HOME EXERCISE PROGRAM: Access Code: [email or web address] URL: [email or web address] Date: 09/19/2021 Prepared by: Claiborne Billings  Exercises - Supine Lower Trunk Rotation  - 3 x daily - 7 x weekly - 1 sets - 3 reps - 20 hold - Hooklying Single Knee to Chest  - 3 x daily - 7 x weekly - 1 sets - 3 reps - 20 hold - Seated Hamstring Stretch  - 3 x daily - 7 x weekly - 1 sets - 3 reps - 20 hold - Sit to Stand Without Arm Support  - 2 x daily - 7 x weekly - 2 sets - 5 reps - Clamshell  - 2 x daily - 7 x weekly - 2 sets - 5 reps   ASSESSMENT:   CLINICAL IMPRESSION: Ms Savarino presents with reports of a subsequent fall this AM with hitting her head.  Secondary to pt being on Coumadin regimen, deferred exercise today until pt can be seen by a MD.  Pt placed a call to notify her provider and this PT messaged Dr Letta Pate to make aware.  Pt provided with fall risk prevention education during session today.  Recommended to use cane or walking stick at all times, even at home to decrease her risk of falling, as she admitted that she has not been using a device at home and tripped over the dog's gate.       OBJECTIVE IMPAIRMENTS Abnormal gait, decreased activity tolerance, decreased strength, increased muscle spasms, impaired flexibility, postural dysfunction, and pain.    ACTIVITY LIMITATIONS carrying, lifting, standing, squatting, and stairs   PARTICIPATION LIMITATIONS: meal prep, cleaning, laundry, and community activity   PERSONAL FACTORS Age and 1-2 comorbidities: chronic LBP, lumbar fusion, legally blind  are also affecting patient's functional outcome.    REHAB POTENTIAL: Good   CLINICAL DECISION MAKING: Evolving/moderate complexity    EVALUATION COMPLEXITY: Moderate     GOALS: Goals reviewed with patient? Yes   SHORT TERM GOALS: Target date: 09/24/2021   Be independent in initial HEP Baseline: Goal status: MET   2.  Report > or = to 25% reduction in LBP with standing and walking  Baseline: 25% reported (09/19/21) Goal status: MET   3.  Verbalize and demonstrate postural corrections to improve standing posture for ADLs and gait  Baseline: flexed trunk in standing  Goal status: INITIAL       LONG TERM GOALS: Target date: 10/22/2021   Be independent in advanced HEP Baseline:  Goal status: INITIAL   2.  Increase FOTO to > or = to 44 Baseline: 37 Goal status: INITIAL   3.  Report > or = to 50% reduction in LBP with standing and walking  Baseline: 6-7/10 Goal status: INITIAL   4.  Demonstrate erect standing posture due to improved endurance and strength > or = to 50% of the time Baseline:  Goal status: INITIAL   5.  Perform 5x sit to stand in < or = to 13 seconds to improve balance and mobility  Baseline: 21 seconds  Goal status: INITIAL         PLAN: PT FREQUENCY: 1-2x/week   PT DURATION: 8 weeks   PLANNED INTERVENTIONS: Therapeutic exercises, Therapeutic activity, Neuromuscular re-education, Balance training, Gait training, Patient/Family education, Joint mobilization, Stair training, Aquatic Therapy, Dry Needling, Electrical stimulation, Spinal manipulation, Spinal mobilization, Cryotherapy, Moist heat, Taping, and Manual therapy.   PLAN FOR NEXT SESSION: Follow up with MD from fall on 10/04/2021, Review new HEP, Continue functional and core strength, flexibility and DN/manual therapy as needed   Juel Burrow, PT 10/04/21 10:52 AM  [facility] [address] [place], [place] [address] Phone # [phone] Fax [phone]

## 2021-10-09 ENCOUNTER — Ambulatory Visit (HOSPITAL_BASED_OUTPATIENT_CLINIC_OR_DEPARTMENT_OTHER): Payer: Medicare HMO | Admitting: Physical Therapy

## 2021-10-11 ENCOUNTER — Ambulatory Visit: Payer: Medicare HMO

## 2021-10-16 ENCOUNTER — Ambulatory Visit (HOSPITAL_BASED_OUTPATIENT_CLINIC_OR_DEPARTMENT_OTHER): Payer: Medicare HMO | Admitting: Physical Therapy

## 2021-10-17 ENCOUNTER — Ambulatory Visit
Admission: RE | Admit: 2021-10-17 | Discharge: 2021-10-17 | Disposition: A | Discharge status: Home / Self Care | Payer: Medicare HMO | Source: Ambulatory Visit | Attending: Family Medicine | Admitting: Family Medicine

## 2021-10-17 DIAGNOSIS — Z1231 Encounter for screening mammogram for malignant neoplasm of breast: Secondary | ICD-10-CM

## 2021-10-18 ENCOUNTER — Encounter: Payer: Self-pay | Admitting: Rehabilitative and Restorative Service Providers"

## 2021-10-18 ENCOUNTER — Ambulatory Visit: Payer: Medicare HMO | Admitting: Rehabilitative and Restorative Service Providers"

## 2021-10-18 DIAGNOSIS — M6281 Muscle weakness (generalized): Secondary | ICD-10-CM

## 2021-10-18 DIAGNOSIS — R2689 Other abnormalities of gait and mobility: Secondary | ICD-10-CM

## 2021-10-18 DIAGNOSIS — M5459 Other low back pain: Secondary | ICD-10-CM

## 2021-10-18 NOTE — Therapy (Signed)
OUTPATIENT PHYSICAL THERAPY TREATMENT NOTE   Patient Name: Emily Peck MRN: [id number] DOB:14-Jul-1955, 66 y.o., female Today's Date: 10/18/2021  PCP: Darrol Jump, NP REFERRING PROVIDER: Alysia Penna,  MD   Progress Note Reporting Period 08/27/2021 to 10/18/2021  See note below for Objective Data and Assessment of Progress/Goals.      END OF SESSION:   PT End of Session - 10/18/21 1021     Visit Number 10    Date for PT Re-Evaluation 12/14/21    Authorization Type [employer]    Authorization Time Period 08/27/21-10/22/21 requested additional auth on 10/18/21    Authorization - Visit Number 10    Authorization - Number of Visits 12    Progress Note Due on Visit 20    PT Start Time 1016    PT Stop Time 1055    PT Time Calculation (min) 39 min    Activity Tolerance Patient tolerated treatment well    Behavior During Therapy WFL for tasks assessed/performed               Past Medical History:  Diagnosis Date   Anxiety    Clotting disorder ([place])    Depression    Dyspnea    GERD (gastroesophageal reflux disease)    Headache    Lumbosacral spondylosis without myelopathy    Neuropathy    Postlaminectomy syndrome, lumbar region    Postlaminectomy syndrome, thoracic region    Stroke [facility])    Past Surgical History:  Procedure Laterality Date   ABDOMINAL HYSTERECTOMY     BACK SURGERY     BIOPSY  10/02/2020   Procedure: BIOPSY;  Surgeon: Juanita Craver, MD;  Location: [address];  Service: Endoscopy;;   COLONOSCOPY WITH PROPOFOL N/A 10/02/2020   Procedure: COLONOSCOPY WITH PROPOFOL;  Surgeon: Juanita Craver, MD;  Location: [facility] ENDOSCOPY;  Service: Endoscopy;  Laterality: N/A;   ESOPHAGOGASTRODUODENOSCOPY (EGD) WITH PROPOFOL N/A 10/02/2020   Procedure: ESOPHAGOGASTRODUODENOSCOPY (EGD) WITH PROPOFOL;  Surgeon: Juanita Craver, MD;  Location: [facility] ENDOSCOPY;  Service: Endoscopy;  Laterality: N/A;   EYE SURGERY     eye removed right    GIVENS CAPSULE STUDY N/A 10/02/2020    Procedure: GIVENS CAPSULE STUDY;  Surgeon: Juanita Craver, MD;  Location: [facility] ENDOSCOPY;  Service: Endoscopy;  Laterality: N/A;   KNEE ARTHROSCOPY     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   Procedure: TOTAL KNEE ARTHROPLASTY;  Surgeon: Melrose Nakayama, MD;  Location: [place];  Service: Orthopedics;  Laterality: Right;   Patient Active Problem List   Diagnosis Date Noted   Iron deficiency anemia 10/23/2020   Symptomatic anemia 09/30/2020   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   Category 4 blindness of left eye 04/19/2019   Nocturia more than twice per night 04/19/2019   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   Nasal septal deviation 04/19/2019   Excessive daytime sleepiness 04/19/2019   Neuropathy 04/19/2019   Sleep-related headache 04/19/2019   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[place]) 04/19/2019   Rotator cuff syndrome of both shoulders 06/09/2018   Cervicalgia 03/19/2018   Disorder of rotator cuff syndrome of right shoulder and allied disorder 01/09/2018   Primary osteoarthritis of right knee 01/09/2016   Peripheral polyneuropathy 10/19/2015   Knee pain, chronic 06/11/2013   Thoracic postlaminectomy syndrome 06/11/2013   Pes planus of both feet 06/11/2013   Trochanteric bursitis of both hips 11/06/2012   Lumbosacral spondylosis without myelopathy 05/09/2011   DEPRESSION 07/24/2006   CARPAL TUNNEL SYNDROME, RIGHT 07/24/2006   MYOPIA, 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  DEGENERATIVE JOINT DISEASE 07/24/2006   DISORDER, LUMBAR DISC W/MYELOPATHY 07/24/2006   Cerebral venous sinus thrombosis 07/24/2006   AVULSION, EYE 12/23/2005    REFERRING DIAG: M47.817 (ICD-10-CM) - Lumbosacral spondylosis without myelopathy  THERAPY DIAG:  Muscle weakness (generalized)  Other low back pain  Other abnormalities of gait and mobility  Rationale for Evaluation and Treatment Rehabilitation  PERTINENT HISTORY: 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  Requires SBA for mobility  SUBJECTIVE: Pt reports that her MD told her that she could participate in therapy, but she is scheduled for a head CT scan.  Pt states that she has been having right hip and groin pain since her fall and her MD advised her that the pain could take time.  Pt denies pain in sitting, only when up standing.  PAIN:  Are you having pain? Yes Pain scale:  0-4/10 Location: Lower back and right hip/groin Description:  achy Aggravating factor: riding in [employer] transportation  Alleviating factor: laying in bed   Bilat knees are painful only when WB - 6/10; 0/10 at rest  PATIENT GOALS:  reduce LBP  OBJECTIVE: (objective measures completed at initial evaluation unless otherwise dated)   DIAGNOSTIC FINDINGS:  MRI 07/02/21: Progressive disc degeneration at L2-3 with mild spinal and left neural foraminal stenosis. 2. Severe L4-5 facet arthrosis with new trace anterolisthesis. No stenosis.   PATIENT SURVEYS:  08/27/21:  FOTO 37 (goal is 44) 10/18/2021:  FOTO 40%     MUSCLE LENGTH: Hamstrings limited by 25-50% with pain    POSTURE: rounded shoulders, forward head, and flexed trunk    PALPATION: Diffuse palpable tenderness over bil lumbar paraspinals and gluteals    LUMBAR ROM:    Not tested due to balance deficits.    LOWER EXTREMITY ROM:      Limited by 25-50% bilaterally with pain    LOWER EXTREMITY MMT:     Bil hips 4-/5 with pain, knees 4/5 with pain   LUMBAR SPECIAL TESTS:  Straight leg raise test: Positive and Slump test: Positive bil- due to pain with all movement    FUNCTIONAL TESTS:  08/27/21:  5x sit to stand: 21 seconds with use of hands  10/18/2021:  5 times sit to/from stand:  19.2 sec without use of UE   GAIT: Distance walked: 100 Assistive device utilized: Single point cane Level of assistance: CGA (secondary to vision) Comments: flexed trunk and antalgic  gait       TODAY'S TREATMENT:  10/18/2021: Nustep level 5 (green machine) x8 minutes with PT present to discuss status Seated with 2# ankle weights:  heel/toe raises, marching, LAQ, hip abduction scissors.  BLE 2x10 reps Supine hamstring stretch with strap 2x20 sec bilat (with verbal and tactile cuing for technique) Lower trunk rotation 2x10 bilat Hooklying single knee to chest 3x10 sec bilat  10/04/2021: Pt arrived to PT clinic stating that she is in more pain today because she had a "bad fall" this morning.  Pt reports that she hit her head and other parts of her body and is having increased pain.  Educated pt that due to her being on Coumadin, she would need to be assessed by a doctor prior to performing any exercises or dry needling.  Pt placed call to provider to notify and they stated that they would call her back to advise.  Educated pt on fall risk prevention strategies such as using  her walking stick at all times, even when at home.  Educated pt that when she was talking on the phone, she needs to be stationary and not trying to multitask.  Pt educated to be careful with her dogs and with placement of the dog gate.  Pt verbalized understanding.  10/02/21:  Pt seen for aquatic therapy today.  Treatment took place in water 3.25-4.5 ft in depth at the [place] [facility]. Temp of water was 91.  Pt entered/exited the pool via stairs with step-to pattern with supervision and bilat rail.  * walking forward / backward, side stepping, and high knee marching - 3 laps of each, UE on white floatation barbell  * Holding wall:  heel/toe raises x 10;  squats x 10 x 2; hip ext x 10; hip abdct x 10; SLS with 2 fingers x 10 sec x 3 each LE * sitting on bench in water:  TUG like exercise working on upright gait with increased speed, without UE support x 4 reps  *Back against wall:  ab set with blue short noodle pull down to lap x 10, x 2 * return to walking with white floatation barbell at 4 ft     * bilat calf stretch with heels off of step x 15s x 2   * R/L hamstring stretch with foot on 2nd step x 3 reps of 15s RLE, x 2 on LLE  Pt requires buoyancy for support and to offload joints with strengthening/balance exercises. Viscosity of the water is needed for resistance of strengthening; water current perturbations provides challenge to standing balance unsupported, requiring increased core activation.  09/24/2021: Nustep level 5 (green machine) x8 minutes with PT present to discuss status Seated with 2# ankle weights:  heel/toe raises, marching, LAQ, hip abduction scissors.  BLE 2x10 reps Sit to stand 2x10 with emphasis on control Seated hamstring stretch 2x20 seconds  Lower trunk rotation 5x10 sec bilat Hooklying single knee to chest 3x10 bilat Sidelying clamshell 2x10  bilat Prone press-ups x10, Prone hip extension x10 bilat     PATIENT EDUCATION:  Education details: Access Code: [email or web address] Person educated: Patient Education method: Consulting civil engineer, Media planner, and Handouts Education comprehension: verbalized understanding and returned demonstration     HOME EXERCISE PROGRAM: Access Code: [email or web address] URL: [email or web address] Date: 09/19/2021 Prepared by: Claiborne Billings  Exercises - Supine Lower Trunk Rotation  - 3 x daily - 7 x weekly - 1 sets - 3 reps - 20 hold - Hooklying Single Knee to Chest  - 3 x daily - 7 x weekly - 1 sets - 3 reps - 20 hold - Seated Hamstring Stretch  - 3 x daily - 7 x weekly - 1 sets - 3 reps - 20 hold - Sit to Stand Without Arm Support  - 2 x daily - 7 x weekly - 2 sets - 5 reps - Clamshell  - 2 x daily - 7 x weekly - 2 sets - 5 reps   ASSESSMENT:   CLINICAL IMPRESSION: Ms Forbess presents with reports of continued pain secondary to fall prior to last session, primarily in right hip/groin region.  Pt states that she is having a head CT and will call her MD to follow up secondary to still having increased right hip and knee pain secondary  to falls.  Pt does state that she is using her walking cane in her home and has not been using phone and walking at the same time, additionally, pt is using more caution with dog gate.  Pt was making great progress with goals prior to fall, but since, has had some regression secondary to pain.  Pt has improved on FOTO score and has improved with 5 times sit to/from stand assessment since initial evaluation, but has not yet reach target.  Pt would benefit from an additional certification of skilled rehabilitation to continue to progress towards goal related activities of 1-2x/week for an additional 8 weeks.      OBJECTIVE IMPAIRMENTS Abnormal gait, decreased activity tolerance, decreased strength, increased muscle spasms, impaired flexibility, postural dysfunction, and pain.    ACTIVITY LIMITATIONS carrying, lifting, standing, squatting, and stairs   PARTICIPATION LIMITATIONS: meal prep, cleaning, laundry, and community activity   PERSONAL FACTORS Age and 1-2 comorbidities: chronic LBP, lumbar fusion, legally blind  are also affecting patient's functional outcome.    REHAB POTENTIAL: Good   CLINICAL DECISION MAKING: Evolving/moderate complexity   EVALUATION COMPLEXITY: Moderate     GOALS: Goals reviewed with patient? Yes   SHORT TERM GOALS: Target date: 09/24/2021   Be independent in initial HEP Baseline: Goal status: MET   2.  Report > or = to 25% reduction in LBP with standing and walking Baseline: 25% reported (09/19/21) Goal status: MET   3.  Verbalize and demonstrate postural corrections to improve standing posture for ADLs and gait  Baseline: flexed trunk in standing  Goal status: Goal Met       LONG TERM GOALS: Target date: 12/14/2021   Be independent in advanced HEP Baseline:  Goal status: INITIAL   2.  Increase FOTO to > or = to 44 Baseline: 37 Goal status: Ongoing (see above)   3.  Report > or = to 50% reduction in LBP with standing and walking  Baseline:  6-7/10 Goal status: INITIAL   4.  Demonstrate erect standing posture due to improved endurance and strength > or = to 50% of the time Baseline:  Goal status: INITIAL   5.  Perform 5x sit to stand in < or = to 13 seconds to improve balance and mobility  Baseline: 21 seconds  Goal status: Ongoing (see above)         PLAN: PT FREQUENCY: 1-2x/week   PT DURATION: 8 weeks   PLANNED INTERVENTIONS: Therapeutic exercises, Therapeutic activity, Neuromuscular re-education, Balance training, Gait training, Patient/Family education, Joint mobilization, Stair training, Aquatic Therapy, Dry Needling, Electrical stimulation, Spinal manipulation, Spinal mobilization, Cryotherapy, Moist heat, Taping, and Manual therapy.   PLAN FOR NEXT SESSION: Update HEP as indicated, Continue functional and core strength, flexibility and DN/manual therapy as needed   Juel Burrow, PT 10/18/21 11:02 AM  [facility] [address], [place], [place] [address] Phone # [phone] Fax [phone]

## 2021-10-18 NOTE — Addendum Note (Signed)
Addended by: Juel Burrow L on: 10/18/2021 11:06 AM   Modules accepted: Orders

## 2021-10-23 ENCOUNTER — Encounter: Payer: Medicare HMO | Attending: Registered Nurse | Admitting: Physical Medicine & Rehabilitation

## 2021-10-23 ENCOUNTER — Ambulatory Visit (HOSPITAL_BASED_OUTPATIENT_CLINIC_OR_DEPARTMENT_OTHER): Payer: Medicare HMO | Admitting: Physical Therapy

## 2021-10-23 ENCOUNTER — Encounter (HOSPITAL_BASED_OUTPATIENT_CLINIC_OR_DEPARTMENT_OTHER): Payer: Self-pay

## 2021-10-23 ENCOUNTER — Encounter: Payer: Self-pay | Admitting: Physical Medicine & Rehabilitation

## 2021-10-23 VITALS — BP 126/74 | HR 71 | Ht 63.0 in | Wt 201.8 lb

## 2021-10-23 DIAGNOSIS — M47817 Spondylosis without myelopathy or radiculopathy, lumbosacral region: Secondary | ICD-10-CM | POA: Insufficient documentation

## 2021-10-23 DIAGNOSIS — G894 Chronic pain syndrome: Secondary | ICD-10-CM | POA: Insufficient documentation

## 2021-10-23 DIAGNOSIS — Z5181 Encounter for therapeutic drug level monitoring: Secondary | ICD-10-CM | POA: Diagnosis present

## 2021-10-23 DIAGNOSIS — M25551 Pain in right hip: Secondary | ICD-10-CM | POA: Diagnosis present

## 2021-10-23 DIAGNOSIS — Z79891 Long term (current) use of opiate analgesic: Secondary | ICD-10-CM | POA: Insufficient documentation

## 2021-10-23 MED ORDER — HYDROCODONE-ACETAMINOPHEN 5-325 MG PO TABS
1.0000 | ORAL_TABLET | Freq: Two times a day (BID) | ORAL | 0 refills | Status: AC | PRN
Start: 2021-10-23 — End: ?

## 2021-10-23 MED ORDER — LIDOCAINE HCL (PF) 2 % IJ SOLN
5.0000 mL | Freq: Once | INTRAMUSCULAR | Status: AC
  Administered 2021-10-23: 5 mL

## 2021-10-23 MED ORDER — LIDOCAINE HCL 1 % IJ SOLN
10.0000 mL | Freq: Once | INTRAMUSCULAR | Status: AC
  Administered 2021-10-23: 10 mL

## 2021-10-23 NOTE — Progress Notes (Signed)
RightL5 dorsal ramus., Right L4 and Right L3 medial branch radio frequency neurotomy under fluoroscopic guidance   Indication: Low back pain due to lumbar spondylosis which has been relieved on 2 occasions by greater than 50% by lumbar medial branch blocks at corresponding levels.  Informed consent was obtained after describing risks and benefits of the procedure with the patient, this includes bleeding, bruising, infection, paralysis and medication side effects. The patient wishes to proceed and has given written consent. The patient was placed in a prone position. The lumbar and sacral area was marked and prepped with Betadine. A 25-gauge 1-1/2 inch needle was inserted into the skin and subcutaneous tissue at 3 sites in one ML of 1% lidocaine was injected into each site. Then a 18-gauge 15 cm radio frequency needle with a 1 cm curved active tip was inserted targeting the Right S1 SAP/sacral ala junction. Bone contact was made and confirmed with lateral imaging.  motor stimulation at 2 Hz confirm proper needle location followed by injection of 75m 2% MPF lidocaine. Then the Right L5 SAP/transverse process junction was targeted. Bone contact was made and confirmed with lateral imaging.  motor stimulation at 2 Hz confirm proper needle location followed by injection of 1127m2% MPF lidocaine. Then the Right L4 SAP/transverse process junction was targeted. Bone contact was made and confirmed with lateral imaging. motor stimulation at 2 Hz confirm proper needle location followed by injection of 27m59m% MPF lidocaine. Radio frequency [facility] seconds was performed. Needles were removed. Post procedure instructions and vital signs were performed. Patient tolerated procedure well. Followup appointment was given.  Left L5 dorsal ramus., left L4 and left L3 medial branch radio frequency neurotomy under fluoroscopic guidance  Indication: Low back pain due to lumbar spondylosis which has been relieved on  2 occasions by greater than 50% by lumbar medial branch blocks at corresponding levels.  Informed consent was obtained after describing risks and benefits of the procedure with the patient, this includes bleeding, bruising, infection, paralysis and medication side effects. The patient wishes to proceed and has given written consent. The patient was placed in a prone position. The lumbar and sacral area was marked and prepped with Betadine. A 25-gauge 1-1/2 inch needle was inserted into the skin and subcutaneous tissue at 3 sites in 1-2 ML of 1% lidocaine was injected into each site. Then a 18-gauge 15 cm radio frequency needle with a 1 cm curved active tip was inserted targeting the left S1 SAP/sacral ala junction. Bone contact was made and confirmed with lateral imaging.  motor stimulation at 2 Hz confirm proper needle location followed by injection of one ML of 2% MPF lidocaine. Then the left L5 SAP/transverse process junction was targeted. Bone contact was made and confirmed with lateral imaging motor stimulation at 2 Hz confirm proper needle location followed by injection of one ML of the solution containing one ML of  2% MPF lidocaine. Then the left L4 SAP/transverse process junction was targeted. Bone contact was made and confirmed with lateral imaging. motor stimulation at 2 Hz confirm proper needle location followed by injection of one ML of the solution containing one ML of2% MPF lidocaine. Radio frequency lesion  [facility] 90 seconds was performed. Needles were removed. Post procedure instructions and vital signs were performed. Patient tolerated procedure well. Followup appointment was given.   Total meds  Lidocaine 1% w/preservative  58m53mdocaine 2% MPF 5ml37m

## 2021-10-23 NOTE — Progress Notes (Signed)
  PROCEDURE RECORD [facility]   Name: Emily Peck DOB:Oct 09, 1955 MRN: [id number]  Date:10/23/2021  Physician: Alysia Penna, MD    Nurse/CMA: Ronnald Ramp, RMA  Allergies:  Allergies  Allergen Reactions   Codeine     Other reaction(s): itching Other reaction(s): itching   No Known Allergies     Consent Signed: Yes.    Is patient diabetic? Yes.    CBG today? .  Pregnant: No. LMP: No LMP recorded. Patient has had a hysterectomy. (age [age])  Anticoagulants: yes (Warfarin) Anti-inflammatory: no Antibiotics: no  Procedure: Bilateral L3-4-5 Radiofrequency  Position: Prone Start Time: 10:54 am  End Time: 11:22 am  Fluoro Time: 1:51  RN/CMA Malikhi Ogan, RMA Ronnald Ramp, RMA    Time 10:35 am 11:31 am    BP 103/67 126/74    Pulse 70 71    Respirations 16 18    O2 Sat 98 97    S/S 6 6    Pain Level 3/10 0/10     D/C home with [employer], patient A & O X 3, D/C instructions reviewed, and sits independently.

## 2021-10-24 ENCOUNTER — Ambulatory Visit
Admission: RE | Admit: 2021-10-24 | Discharge: 2021-10-24 | Disposition: A | Discharge status: Home / Self Care | Payer: Medicare HMO | Source: Ambulatory Visit | Attending: Physical Medicine & Rehabilitation | Admitting: Physical Medicine & Rehabilitation

## 2021-10-24 DIAGNOSIS — M25551 Pain in right hip: Secondary | ICD-10-CM

## 2021-10-25 ENCOUNTER — Ambulatory Visit: Payer: Medicare HMO | Admitting: Rehabilitative and Restorative Service Providers"

## 2021-10-26 LAB — TOXASSURE SELECT,+ANTIDEPR,UR

## 2021-10-29 ENCOUNTER — Telehealth: Payer: Self-pay

## 2021-10-29 ENCOUNTER — Telehealth: Payer: Self-pay | Admitting: *Deleted

## 2021-10-29 NOTE — Telephone Encounter (Signed)
Patient calling in to see about her x-ray results, also states she is still having some pain from the nerve block and right hip and groin pain as well, she uses heating pad and when she gets up to walk it is very painful. She wants to know if their are any other suggestions to relieve the pain.

## 2021-10-29 NOTE — Telephone Encounter (Signed)
Urine drug screen for this encounter is consistent for prescribed medication. However there is alcohol present as well.  This is the first inconsistency for Emily Peck, so a formal warning letter was mailed and sent through [facility].

## 2021-10-30 ENCOUNTER — Ambulatory Visit (HOSPITAL_BASED_OUTPATIENT_CLINIC_OR_DEPARTMENT_OTHER): Payer: Medicare HMO | Admitting: Physical Therapy

## 2021-10-30 NOTE — Telephone Encounter (Signed)
Called patient and left Vm in regards to hip xray, lidocaine patches and Rx being sent

## 2021-11-01 ENCOUNTER — Ambulatory Visit: Payer: Medicare HMO

## 2021-11-01 DIAGNOSIS — M6281 Muscle weakness (generalized): Secondary | ICD-10-CM

## 2021-11-01 DIAGNOSIS — R2689 Other abnormalities of gait and mobility: Secondary | ICD-10-CM

## 2021-11-01 DIAGNOSIS — M5459 Other low back pain: Secondary | ICD-10-CM

## 2021-11-01 NOTE — Therapy (Signed)
OUTPATIENT PHYSICAL THERAPY TREATMENT NOTE   Patient Name: Emily Peck MRN: [id number] DOB:1955-06-03, 66 y.o., female Today's Date: 11/01/2021  PCP: Darrol Jump, NP REFERRING PROVIDER: Alysia Penna,  MD        END OF SESSION:   PT End of Session - 11/01/21 1056     Visit Number 11    Date for PT Re-Evaluation 12/14/21    Authorization Type [employer]here    Authorization Time Period 16 visits 8/21-10/13/23    Authorization - Visit Number 2    Authorization - Number of Visits 16    Progress Note Due on Visit 20    PT Start Time 1016    PT Stop Time 1059    PT Time Calculation (min) 43 min    Activity Tolerance Patient tolerated treatment well    Behavior During Therapy WFL for tasks assessed/performed                Past Medical History:  Diagnosis Date   Anxiety    Clotting disorder ([place])    Depression    Dyspnea    GERD (gastroesophageal reflux disease)    Headache    Lumbosacral spondylosis without myelopathy    Neuropathy    Postlaminectomy syndrome, lumbar region    Postlaminectomy syndrome, thoracic region    Stroke [facility])    Past Surgical History:  Procedure Laterality Date   ABDOMINAL HYSTERECTOMY     BACK SURGERY     BIOPSY  10/02/2020   Procedure: BIOPSY;  Surgeon: Juanita Craver, MD;  Location: [place];  Service: Endoscopy;;   COLONOSCOPY WITH PROPOFOL N/A 10/02/2020   Procedure: COLONOSCOPY WITH PROPOFOL;  Surgeon: Juanita Craver, MD;  Location: [facility] ENDOSCOPY;  Service: Endoscopy;  Laterality: N/A;   ESOPHAGOGASTRODUODENOSCOPY (EGD) WITH PROPOFOL N/A 10/02/2020   Procedure: ESOPHAGOGASTRODUODENOSCOPY (EGD) WITH PROPOFOL;  Surgeon: Juanita Craver, MD;  Location: [facility] ENDOSCOPY;  Service: Endoscopy;  Laterality: N/A;   EYE SURGERY     eye removed right    GIVENS CAPSULE STUDY N/A 10/02/2020   Procedure: GIVENS CAPSULE STUDY;  Surgeon: Juanita Craver, MD;  Location: [facility] ENDOSCOPY;  Service: Endoscopy;  Laterality: N/A;   KNEE ARTHROSCOPY     TOTAL  KNEE ARTHROPLASTY Right 01/09/2016   Procedure: TOTAL KNEE ARTHROPLASTY;  Surgeon: [place] Nakayama, MD;  Location: [place];  Service: Orthopedics;  Laterality: Right;   Patient Active Problem List   Diagnosis Date Noted   Iron deficiency anemia 10/23/2020   Symptomatic anemia 09/30/2020   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   Category 4 blindness of left eye 04/19/2019   Nocturia more than twice per night 04/19/2019   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   Nasal septal deviation 04/19/2019   Excessive daytime sleepiness 04/19/2019   Neuropathy 04/19/2019   Sleep-related headache 04/19/2019   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[place]) 04/19/2019   Rotator cuff syndrome of both shoulders 06/09/2018   Cervicalgia 03/19/2018   Disorder of rotator cuff syndrome of right shoulder and allied disorder 01/09/2018   Primary osteoarthritis of right knee 01/09/2016   Peripheral polyneuropathy 10/19/2015   Knee pain, chronic 06/11/2013   Thoracic postlaminectomy syndrome 06/11/2013   Pes planus of both feet 06/11/2013   Trochanteric bursitis of both hips 11/06/2012   Lumbosacral spondylosis without myelopathy 05/09/2011   DEPRESSION 07/24/2006   CARPAL TUNNEL SYNDROME, RIGHT 07/24/2006   MYOPIA, 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   DEGENERATIVE JOINT DISEASE 07/24/2006   DISORDER, LUMBAR DISC W/MYELOPATHY 07/24/2006   Cerebral venous sinus thrombosis 07/24/2006  AVULSION, EYE 12/23/2005    REFERRING DIAG: M47.817 (ICD-10-CM) - Lumbosacral spondylosis without myelopathy  THERAPY DIAG:  Muscle weakness (generalized)  Other low back pain  Other abnormalities of gait and mobility  Rationale for Evaluation and Treatment Rehabilitation  PERTINENT HISTORY: 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  Requires SBA for mobility  SUBJECTIVE: Pt continues to report that she has  been having right hip and groin pain since her fall.  Pt is using pain patches.  PAIN:  Are you having pain? Yes Pain scale:  0-4/10 Location: Lower back and right hip/groin Description:  achy Aggravating factor: riding in [employer] transportation  Alleviating factor: laying in bed   Bilat knees are painful only when WB - 6/10; 0/10 at rest  PATIENT GOALS:  reduce LBP  OBJECTIVE: (objective measures completed at initial evaluation unless otherwise dated)   DIAGNOSTIC FINDINGS:  MRI 07/02/21: Progressive disc degeneration at L2-3 with mild spinal and left neural foraminal stenosis. 2. Severe L4-5 facet arthrosis with new trace anterolisthesis. No stenosis.   PATIENT SURVEYS:  08/27/21:  FOTO 37 (goal is 44) 10/18/2021:  FOTO 40%     MUSCLE LENGTH: Hamstrings limited by 25-50% with pain    POSTURE: rounded shoulders, forward head, and flexed trunk    PALPATION: Diffuse palpable tenderness over bil lumbar paraspinals and gluteals    LUMBAR ROM:    Not tested due to balance deficits.    LOWER EXTREMITY ROM:      Limited by 25-50% bilaterally with pain    LOWER EXTREMITY MMT:     Bil hips 4-/5 with pain, knees 4/5 with pain   LUMBAR SPECIAL TESTS:  Straight leg raise test: Positive and Slump test: Positive bil- due to pain with all movement    FUNCTIONAL TESTS:  08/27/21:  5x sit to stand: 21 seconds with use of hands  10/18/2021:  5 times sit to/from stand:  19.2 sec without use of UE   GAIT: Distance walked: 100 Assistive device utilized: Single point cane Level of assistance: CGA (secondary to vision) Comments: flexed trunk and antalgic gait       TODAY'S TREATMENT:  11/01/2021: Nustep level 5 (green machine) x8 minutes with PT present to discuss status Seated with 2# ankle weights:  heel/toe raises, marching, LAQ, hip abduction scissors.  BLE 2x10 reps seated hamstring stretch  2x20 sec  Lt piriformis stretch in sitting 2x30 seconds Trigger Point Dry-Needling   Treatment instructions: Expect mild to moderate muscle soreness. S/S of pneumothorax if dry needled over a lung field, and to seek immediate medical attention should they occur. Patient verbalized understanding of these instructions and education.  Patient Consent Given: Yes Education handout provided: Previously provided Muscles treated: Bil lumbar paraspinals and Rt gluteals Treatment response/outcome: Utilized skilled palpation to identify trigger points.  During dry needling able to palpate muscle twitch and muscle elongation   Skilled palpation and monitoring by PT during dry needling   10/18/2021: Nustep level 5 (green machine) x8 minutes with PT present to discuss status Seated with 2# ankle weights:  heel/toe raises, marching, LAQ, hip abduction scissors.  BLE 2x10 reps Supine hamstring stretch with strap 2x20 sec bilat (with verbal and tactile cuing for technique) Lower trunk rotation 2x10 bilat Hooklying single knee to chest 3x10 sec bilat  10/04/2021: Pt arrived to PT clinic stating that she is in more pain today because she had a "  bad fall" this morning.  Pt reports that she hit her head and other parts of her body and is having increased pain.  Educated pt that due to her being on Coumadin, she would need to be assessed by a doctor prior to performing any exercises or dry needling.  Pt placed call to provider to notify and they stated that they would call her back to advise.  Educated pt on fall risk prevention strategies such as using her walking stick at all times, even when at home.  Educated pt that when she was talking on the phone, she needs to be stationary and not trying to multitask.  Pt educated to be careful with her dogs and with placement of the dog gate.  Pt verbalized understanding.   PATIENT EDUCATION:  Education details: Access Code: [email or web address] Person educated: Patient Education method: Consulting civil engineer, Media planner, and Handouts Education comprehension: verbalized  understanding and returned demonstration     HOME EXERCISE PROGRAM: Access Code: [email or web address] URL: [email or web address] Date: 09/19/2021 Prepared by: Claiborne Billings  Exercises - Supine Lower Trunk Rotation  - 3 x daily - 7 x weekly - 1 sets - 3 reps - 20 hold - Hooklying Single Knee to Chest  - 3 x daily - 7 x weekly - 1 sets - 3 reps - 20 hold - Seated Hamstring Stretch  - 3 x daily - 7 x weekly - 1 sets - 3 reps - 20 hold - Sit to Stand Without Arm Support  - 2 x daily - 7 x weekly - 2 sets - 5 reps - Clamshell  - 2 x daily - 7 x weekly - 2 sets - 5 reps   ASSESSMENT:   CLINICAL IMPRESSION: Pt continues to report continued pain secondary to fall a few weeks ago.  Pt is going to see a neurologist after her head CT as she needs an MRI. Pt reports that she hasn't been doing her exercises as much at home due to not feeling good. PT encouraged pt to remain active at home.  Pt was making great progress with goals prior to fall, but since, has had some regression secondary to pain. PT monitored closely throughout session for pain and she was encouraged to stretch gently.  Pt with tension in bil gluteals and lumbar spine and responded well to DN with twitch and improved tissue mobility after.  Patient will benefit from skilled PT to address the below impairments and improve overall function.      OBJECTIVE IMPAIRMENTS Abnormal gait, decreased activity tolerance, decreased strength, increased muscle spasms, impaired flexibility, postural dysfunction, and pain.    ACTIVITY LIMITATIONS carrying, lifting, standing, squatting, and stairs   PARTICIPATION LIMITATIONS: meal prep, cleaning, laundry, and community activity   PERSONAL FACTORS Age and 1-2 comorbidities: chronic LBP, lumbar fusion, legally blind  are also affecting patient's functional outcome.    REHAB POTENTIAL: Good   CLINICAL DECISION MAKING: Evolving/moderate complexity   EVALUATION COMPLEXITY: Moderate     GOALS: Goals  reviewed with patient? Yes   SHORT TERM GOALS: Target date: 09/24/2021   Be independent in initial HEP Baseline: Goal status: MET   2.  Report > or = to 25% reduction in LBP with standing and walking Baseline: 25% reported (09/19/21) Goal status: MET   3.  Verbalize and demonstrate postural corrections to improve standing posture for ADLs and gait  Baseline: flexed trunk in standing  Goal status: Goal Met       LONG TERM GOALS: Target date: 12/14/2021  Be independent in advanced HEP Baseline:  Goal status: INITIAL   2.  Increase FOTO to > or = to 44 Baseline: 37 Goal status: Ongoing (see above)   3.  Report > or = to 50% reduction in LBP with standing and walking  Baseline: 6-7/10 Goal status: INITIAL   4.  Demonstrate erect standing posture due to improved endurance and strength > or = to 50% of the time Baseline:  Goal status: INITIAL   5.  Perform 5x sit to stand in < or = to 13 seconds to improve balance and mobility  Baseline: 21 seconds  Goal status: Ongoing (see above)         PLAN: PT FREQUENCY: 1-2x/week   PT DURATION: 8 weeks   PLANNED INTERVENTIONS: Therapeutic exercises, Therapeutic activity, Neuromuscular re-education, Balance training, Gait training, Patient/Family education, Joint mobilization, Stair training, Aquatic Therapy, Dry Needling, Electrical stimulation, Spinal manipulation, Spinal mobilization, Cryotherapy, Moist heat, Taping, and Manual therapy.   PLAN FOR NEXT SESSION: Update HEP as indicated, Continue functional and core strength, flexibility and DN/manual therapy as needed.  Pt has aquatic PT scheduled next month.     Sigurd Sos, PT 11/01/21 11:09 AM   [place] [address] [place], [place] [address] Phone # [phone] Fax [phone]

## 2021-11-06 ENCOUNTER — Telehealth: Payer: Self-pay | Admitting: Physical Medicine & Rehabilitation

## 2021-11-06 NOTE — Telephone Encounter (Signed)
Pt. Was sent a letter for drug/alcohol presence in her drug screen. She did not take it well, felt letter was accusatory and has opted to no longer be a patient of our facility.

## 2021-11-07 ENCOUNTER — Ambulatory Visit: Payer: Medicare HMO | Attending: Physical Medicine & Rehabilitation

## 2021-11-07 DIAGNOSIS — M6281 Muscle weakness (generalized): Secondary | ICD-10-CM | POA: Diagnosis present

## 2021-11-07 DIAGNOSIS — M5459 Other low back pain: Secondary | ICD-10-CM | POA: Diagnosis present

## 2021-11-07 DIAGNOSIS — R2689 Other abnormalities of gait and mobility: Secondary | ICD-10-CM | POA: Insufficient documentation

## 2021-11-07 NOTE — Therapy (Signed)
OUTPATIENT PHYSICAL THERAPY TREATMENT NOTE   Patient Name: Emily Peck MRN: [id number] DOB:1955-04-08, 66 y.o., female Today's Date: 11/07/2021  PCP: Darrol Jump, NP REFERRING PROVIDER: Alysia Penna,  MD        END OF SESSION:   PT End of Session - 11/07/21 1114     Visit Number 12    Date for PT Re-Evaluation 12/14/21    Authorization Type [employer]    Authorization Time Period 16 visits 8/21-10/13/23    Authorization - Visit Number 3    Authorization - Number of Visits 16    Progress Note Due on Visit 20    PT Start Time 1016    PT Stop Time 1057    PT Time Calculation (min) 41 min    Activity Tolerance Patient tolerated treatment well    Behavior During Therapy WFL for tasks assessed/performed                 Past Medical History:  Diagnosis Date   Anxiety    Clotting disorder ([place])    Depression    Dyspnea    GERD (gastroesophageal reflux disease)    Headache    Lumbosacral spondylosis without myelopathy    Neuropathy    Postlaminectomy syndrome, lumbar region    Postlaminectomy syndrome, thoracic region    Stroke [facility])    Past Surgical History:  Procedure Laterality Date   ABDOMINAL HYSTERECTOMY     BACK SURGERY     BIOPSY  10/02/2020   Procedure: BIOPSY;  Surgeon: Juanita Craver, MD;  Location: [place];  Service: Endoscopy;;   COLONOSCOPY WITH PROPOFOL N/A 10/02/2020   Procedure: COLONOSCOPY WITH PROPOFOL;  Surgeon: Juanita Craver, MD;  Location: [facility] ENDOSCOPY;  Service: Endoscopy;  Laterality: N/A;   ESOPHAGOGASTRODUODENOSCOPY (EGD) WITH PROPOFOL N/A 10/02/2020   Procedure: ESOPHAGOGASTRODUODENOSCOPY (EGD) WITH PROPOFOL;  Surgeon: Juanita Craver, MD;  Location: [facility] ENDOSCOPY;  Service: Endoscopy;  Laterality: N/A;   EYE SURGERY     eye removed right    GIVENS CAPSULE STUDY N/A 10/02/2020   Procedure: GIVENS CAPSULE STUDY;  Surgeon: Juanita Craver, MD;  Location: [facility] ENDOSCOPY;  Service: Endoscopy;  Laterality: N/A;   KNEE ARTHROSCOPY     TOTAL  KNEE ARTHROPLASTY Right 01/09/2016   Procedure: TOTAL KNEE ARTHROPLASTY;  Surgeon: [place] Nakayama, MD;  Location: [place];  Service: Orthopedics;  Laterality: Right;   Patient Active Problem List   Diagnosis Date Noted   Iron deficiency anemia 10/23/2020   Symptomatic anemia 09/30/2020   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   Category 4 blindness of left eye 04/19/2019   Nocturia more than twice per night 04/19/2019   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   Nasal septal deviation 04/19/2019   Excessive daytime sleepiness 04/19/2019   Neuropathy 04/19/2019   Sleep-related headache 04/19/2019   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[place]) 04/19/2019   Rotator cuff syndrome of both shoulders 06/09/2018   Cervicalgia 03/19/2018   Disorder of rotator cuff syndrome of right shoulder and allied disorder 01/09/2018   Primary osteoarthritis of right knee 01/09/2016   Peripheral polyneuropathy 10/19/2015   Knee pain, chronic 06/11/2013   Thoracic postlaminectomy syndrome 06/11/2013   Pes planus of both feet 06/11/2013   Trochanteric bursitis of both hips 11/06/2012   Lumbosacral spondylosis without myelopathy 05/09/2011   DEPRESSION 07/24/2006   CARPAL TUNNEL SYNDROME, RIGHT 07/24/2006   MYOPIA, 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   DEGENERATIVE JOINT DISEASE 07/24/2006   DISORDER, LUMBAR DISC W/MYELOPATHY 07/24/2006   Cerebral venous sinus thrombosis 07/24/2006  AVULSION, EYE 12/23/2005    REFERRING DIAG: M47.817 (ICD-10-CM) - Lumbosacral spondylosis without myelopathy  THERAPY DIAG:  Other low back pain  Other abnormalities of gait and mobility  Muscle weakness (generalized)  Rationale for Evaluation and Treatment Rehabilitation  PERTINENT HISTORY: 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  Requires SBA for mobility  SUBJECTIVE: I am doing as best as I can.    PAIN:  Are you having pain? Yes Pain scale:  0-4/10 Location: Lower back and right hip/groin Description:  achy Aggravating factor: standing too long, riding on SCAT Alleviating factor: laying in bed   Bilat knees are painful only when WB - 6/10; 0/10 at rest  PATIENT GOALS:  reduce LBP  OBJECTIVE: (objective measures completed at initial evaluation unless otherwise dated)   DIAGNOSTIC FINDINGS:  MRI 07/02/21: Progressive disc degeneration at L2-3 with mild spinal and left neural foraminal stenosis. 2. Severe L4-5 facet arthrosis with new trace anterolisthesis. No stenosis.   PATIENT SURVEYS:  08/27/21:  FOTO 37 (goal is 44) 10/18/2021:  FOTO 40%     MUSCLE LENGTH: Hamstrings limited by 25-50% with pain    POSTURE: rounded shoulders, forward head, and flexed trunk    PALPATION: Diffuse palpable tenderness over bil lumbar paraspinals and gluteals    LUMBAR ROM:    Not tested due to balance deficits.    LOWER EXTREMITY ROM:      Limited by 25-50% bilaterally with pain    LOWER EXTREMITY MMT:     Bil hips 4-/5 with pain, knees 4/5 with pain   LUMBAR SPECIAL TESTS:  Straight leg raise test: Positive and Slump test: Positive bil- due to pain with all movement    FUNCTIONAL TESTS:  08/27/21:  5x sit to stand: 21 seconds with use of hands  10/18/2021:  5 times sit to/from stand:  19.2 sec without use of UE   GAIT: Distance walked: 100 Assistive device utilized: Single point cane Level of assistance: CGA (secondary to vision) Comments: flexed trunk and antalgic gait       TODAY'S TREATMENT: 11/07/2021: Nustep level 5 (green machine) x8 minutes with PT present to discuss status Seated with 2# ankle weights:  heel/toe raises, marching, LAQ, hip abduction scissors.  BLE 2x10 reps seated hamstring stretch  2x20 sec  Lt piriformis stretch in sitting 2x30 seconds Trigger Point Dry-Needling  Treatment instructions: Expect mild to moderate muscle soreness. S/S of  pneumothorax if dry needled over a lung field, and to seek immediate medical attention should they occur. Patient verbalized understanding of these instructions and education.  Patient Consent Given: Yes Education handout provided: Previously provided Muscles treated: Bil lumbar paraspinals and Rt gluteals Treatment response/outcome: Utilized skilled palpation to identify trigger points.  During dry needling able to palpate muscle twitch and muscle elongation   Skilled palpation and monitoring by PT during dry needling   11/01/2021: Nustep level 5 (green machine) x8 minutes with PT present to discuss status Seated with 2# ankle weights:  heel/toe raises, marching, LAQ, hip abduction scissors.  BLE 2x10 reps seated hamstring stretch  2x20 sec  Lt piriformis stretch in sitting 2x30 seconds Trigger Point Dry-Needling  Treatment instructions: Expect mild to moderate muscle soreness. S/S of pneumothorax if dry needled over a lung field, and to seek immediate medical attention should they occur. Patient verbalized understanding of these instructions and education.  Patient Consent Given: Yes Education handout provided:  Previously provided Muscles treated: Bil lumbar paraspinals and Rt gluteals Treatment response/outcome: Utilized skilled palpation to identify trigger points.  During dry needling able to palpate muscle twitch and muscle elongation   Skilled palpation and monitoring by PT during dry needling   10/18/2021: Nustep level 5 (green machine) x8 minutes with PT present to discuss status Seated with 2# ankle weights:  heel/toe raises, marching, LAQ, hip abduction scissors.  BLE 2x10 reps Supine hamstring stretch with strap 2x20 sec bilat (with verbal and tactile cuing for technique) Lower trunk rotation 2x10 bilat Hooklying single knee to chest 3x10 sec bilat  PATIENT EDUCATION:  Education details: Access Code: [email or web address] Person educated: Patient Education method: Explanation,  Demonstration, and Handouts Education comprehension: verbalized understanding and returned demonstration     HOME EXERCISE PROGRAM: Access Code: [email or web address] URL: [email or web address] Date: 09/19/2021 Prepared by: Claiborne Billings  Exercises - Supine Lower Trunk Rotation  - 3 x daily - 7 x weekly - 1 sets - 3 reps - 20 hold - Hooklying Single Knee to Chest  - 3 x daily - 7 x weekly - 1 sets - 3 reps - 20 hold - Seated Hamstring Stretch  - 3 x daily - 7 x weekly - 1 sets - 3 reps - 20 hold - Sit to Stand Without Arm Support  - 2 x daily - 7 x weekly - 2 sets - 5 reps - Clamshell  - 2 x daily - 7 x weekly - 2 sets - 5 reps   ASSESSMENT:   CLINICAL IMPRESSION: Pt reports that she is doing her exercises more frequently now at home.   Pt was making great progress with goals prior to fall, but since, has had some regression secondary to pain. PT monitored closely throughout session for pain and provided verbal and tactile cues for technique.  Pt with tension in bil gluteals and lumbar spine and responded well to DN with twitch and improved tissue mobility after.  Patient will benefit from skilled PT to address the below impairments and improve overall function.      OBJECTIVE IMPAIRMENTS Abnormal gait, decreased activity tolerance, decreased strength, increased muscle spasms, impaired flexibility, postural dysfunction, and pain.    ACTIVITY LIMITATIONS carrying, lifting, standing, squatting, and stairs   PARTICIPATION LIMITATIONS: meal prep, cleaning, laundry, and community activity   PERSONAL FACTORS Age and 1-2 comorbidities: chronic LBP, lumbar fusion, legally blind  are also affecting patient's functional outcome.    REHAB POTENTIAL: Good   CLINICAL DECISION MAKING: Evolving/moderate complexity   EVALUATION COMPLEXITY: Moderate     GOALS: Goals reviewed with patient? Yes   SHORT TERM GOALS: Target date: 09/24/2021   Be independent in initial HEP Baseline: Goal status: MET    2.  Report > or = to 25% reduction in LBP with standing and walking Baseline: 25% reported (09/19/21) Goal status: MET   3.  Verbalize and demonstrate postural corrections to improve standing posture for ADLs and gait  Baseline: flexed trunk in standing  Goal status: Goal Met       LONG TERM GOALS: Target date: 12/14/2021   Be independent in advanced HEP Baseline:  Goal status: INITIAL   2.  Increase FOTO to > or = to 44 Baseline: 37 Goal status: Ongoing (see above)   3.  Report > or = to 50% reduction in LBP with standing and walking  Baseline: 6-7/10 Goal status: INITIAL   4.  Demonstrate erect standing posture due to improved endurance and strength > or = to  50% of the time Baseline:  Goal status: INITIAL   5.  Perform 5x sit to stand in < or = to 13 seconds to improve balance and mobility  Baseline: 21 seconds  Goal status: Ongoing (see above)         PLAN: PT FREQUENCY: 1-2x/week   PT DURATION: 8 weeks   PLANNED INTERVENTIONS: Therapeutic exercises, Therapeutic activity, Neuromuscular re-education, Balance training, Gait training, Patient/Family education, Joint mobilization, Stair training, Aquatic Therapy, Dry Needling, Electrical stimulation, Spinal manipulation, Spinal mobilization, Cryotherapy, Moist heat, Taping, and Manual therapy.   PLAN FOR NEXT SESSION: Update HEP as indicated, Continue functional and core strength, flexibility and DN/manual therapy as needed.  Pt has aquatic PT scheduled soon.   Sigurd Sos, PT 11/07/21 11:16 AM   [place] [address] [place], [place] [address] Phone # [phone] Fax [phone]

## 2021-11-13 ENCOUNTER — Encounter: Payer: Self-pay | Admitting: Rehabilitative and Restorative Service Providers"

## 2021-11-13 ENCOUNTER — Ambulatory Visit: Payer: Medicare HMO | Admitting: Rehabilitative and Restorative Service Providers"

## 2021-11-13 DIAGNOSIS — M5459 Other low back pain: Secondary | ICD-10-CM

## 2021-11-13 DIAGNOSIS — R2689 Other abnormalities of gait and mobility: Secondary | ICD-10-CM

## 2021-11-13 DIAGNOSIS — M6281 Muscle weakness (generalized): Secondary | ICD-10-CM

## 2021-11-13 NOTE — Therapy (Signed)
OUTPATIENT PHYSICAL THERAPY TREATMENT NOTE   Patient Name: Emily Peck MRN: [id number] DOB:Mar 25, 1955, 66 y.o., female Today's Date: 11/13/2021  PCP: Darrol Jump, NP REFERRING PROVIDER: Alysia Penna,  MD     Progress Note Reporting Period 10/18/2021 to 11/13/2021  See note below for Objective Data and Assessment of Progress/Goals.         END OF SESSION:   PT End of Session - 11/13/21 1021     Visit Number 13    Date for PT Re-Evaluation 12/14/21    Authorization Type [employer]    Authorization Time Period 16 visits 8/21-10/13/23    Authorization - Visit Number 4    Authorization - Number of Visits 16    Progress Note Due on Visit 23    PT Start Time 5638    PT Stop Time 1055    PT Time Calculation (min) 40 min    Activity Tolerance Patient tolerated treatment well    Behavior During Therapy WFL for tasks assessed/performed                 Past Medical History:  Diagnosis Date   Anxiety    Clotting disorder ([place])    Depression    Dyspnea    GERD (gastroesophageal reflux disease)    Headache    Lumbosacral spondylosis without myelopathy    Neuropathy    Postlaminectomy syndrome, lumbar region    Postlaminectomy syndrome, thoracic region    Stroke [facility])    Past Surgical History:  Procedure Laterality Date   ABDOMINAL HYSTERECTOMY     BACK SURGERY     BIOPSY  10/02/2020   Procedure: BIOPSY;  Surgeon: Juanita Craver, MD;  Location: [place];  Service: Endoscopy;;   COLONOSCOPY WITH PROPOFOL N/A 10/02/2020   Procedure: COLONOSCOPY WITH PROPOFOL;  Surgeon: Juanita Craver, MD;  Location[facility] ENDOSCOPY;  Service: Endoscopy;  Laterality: N/A;   ESOPHAGOGASTRODUODENOSCOPY (EGD) WITH PROPOFOL N/A 10/02/2020   Procedure: ESOPHAGOGASTRODUODENOSCOPY (EGD) WITH PROPOFOL;  Surgeon: Juanita Craver, MD;  Location: [facility] ENDOSCOPY;  Service: Endoscopy;  Laterality: N/A;   EYE SURGERY     eye removed right    GIVENS CAPSULE STUDY N/A 10/02/2020   Procedure: GIVENS  CAPSULE STUDY;  Surgeon: Juanita Craver, MD;  Location: [facility] ENDOSCOPY;  Service: Endoscopy;  Laterality: N/A;   KNEE ARTHROSCOPY     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   Procedure: TOTAL KNEE ARTHROPLASTY;  Surgeon: Melrose Nakayama, MD;  Location: [place];  Service: Orthopedics;  Laterality: Right;   Patient Active Problem List   Diagnosis Date Noted   Iron deficiency anemia 10/23/2020   Symptomatic anemia 09/30/2020   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   Category 4 blindness of left eye 04/19/2019   Nocturia more than twice per night 04/19/2019   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   Nasal septal deviation 04/19/2019   Excessive daytime sleepiness 04/19/2019   Neuropathy 04/19/2019   Sleep-related headache 04/19/2019   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[place]) 04/19/2019   Rotator cuff syndrome of both shoulders 06/09/2018   Cervicalgia 03/19/2018   Disorder of rotator cuff syndrome of right shoulder and allied disorder 01/09/2018   Primary osteoarthritis of right knee 01/09/2016   Peripheral polyneuropathy 10/19/2015   Knee pain, chronic 06/11/2013   Thoracic postlaminectomy syndrome 06/11/2013   Pes planus of both feet 06/11/2013   Trochanteric bursitis of both hips 11/06/2012   Lumbosacral spondylosis without myelopathy 05/09/2011   DEPRESSION 07/24/2006   CARPAL TUNNEL SYNDROME, RIGHT 07/24/2006   MYOPIA, PROGRESSIVE  HIGH 07/24/2006   DEGENERATIVE JOINT DISEASE 07/24/2006   DISORDER, LUMBAR DISC W/MYELOPATHY 07/24/2006   Cerebral venous sinus thrombosis 07/24/2006   AVULSION, EYE 12/23/2005    REFERRING DIAG: M47.817 (ICD-10-CM) - Lumbosacral spondylosis without myelopathy  THERAPY DIAG:  Other low back pain  Other abnormalities of gait and mobility  Muscle weakness (generalized)  Rationale for Evaluation and Treatment Rehabilitation  PERTINENT HISTORY: 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  Requires SBA for mobility  SUBJECTIVE: "I'm pretty good today"    PAIN:  Are you having pain? Yes Pain scale:  0-4/10 Location: Lower back and right hip/groin Description:  achy Aggravating factor: standing too long, riding on SCAT Alleviating factor: laying in bed   Bilat knees are painful only when WB - 6/10; 0/10 at rest  PATIENT GOALS:  reduce LBP  OBJECTIVE: (objective measures completed at initial evaluation unless otherwise dated)   DIAGNOSTIC FINDINGS:  MRI 07/02/21: Progressive disc degeneration at L2-3 with mild spinal and left neural foraminal stenosis. 2. Severe L4-5 facet arthrosis with new trace anterolisthesis. No stenosis.   PATIENT SURVEYS:  08/27/21:  FOTO 37 (goal is 44) 10/18/2021:  FOTO 40% 11/13/2021:  FOTO 57%     MUSCLE LENGTH: Hamstrings limited by 25-50% with pain    POSTURE: rounded shoulders, forward head, and flexed trunk    PALPATION: Diffuse palpable tenderness over bil lumbar paraspinals and gluteals    LUMBAR ROM:    Not tested due to balance deficits.    LOWER EXTREMITY ROM:      Limited by 25-50% bilaterally with pain    LOWER EXTREMITY MMT:     Bil hips 4-/5 with pain, knees 4/5 with pain   LUMBAR SPECIAL TESTS:  Straight leg raise test: Positive and Slump test: Positive bil- due to pain with all movement    FUNCTIONAL TESTS:  08/27/21:  5x sit to stand: 21 seconds with use of hands  10/18/2021:  5 times sit to/from stand:  19.2 sec without use of UE 11/13/2021:  5 times sit to/from stand:  9.4 sec without use of UE   GAIT: Distance walked: 100 Assistive device utilized: Single point cane Level of assistance: CGA (secondary to vision) Comments: flexed trunk and antalgic gait       TODAY'S TREATMENT: 11/13/2021: Nustep level 5 (green machine) x8 minutes with PT present to discuss status Seated with 2# ankle weights:  heel/toe raises, marching, LAQ, hip  abduction scissors.  BLE 2x10 reps Piriformis stretch in sitting 2x20 sec bilat Seated hamstring stretch 2x20 sec bilat Sit to/from stand x5 reps Seated pushing purple ball against thighs for TA contraction 2x10 Seated hip to hip with 5# kettlebell 3x10, Seated hip to shoulder x10 bilat   11/07/2021: Nustep level 5 (green machine) x8 minutes with PT present to discuss status Seated with 2# ankle weights:  heel/toe raises, marching, LAQ, hip abduction scissors.  BLE 2x10 reps seated hamstring stretch  2x20 sec  Lt piriformis stretch in sitting 2x30 seconds Trigger Point Dry-Needling  Treatment instructions: Expect mild to moderate muscle soreness. S/S of pneumothorax if dry needled over a lung field, and to seek immediate medical attention should they occur. Patient verbalized understanding of these instructions and education. Patient Consent Given: Yes Education handout provided: Previously provided Muscles treated: Bil lumbar paraspinals and Rt gluteals Treatment response/outcome: Utilized skilled palpation to identify trigger points.  During dry needling  able to palpate muscle twitch and muscle elongation  Skilled palpation and monitoring by PT during dry needling   11/01/2021: Nustep level 5 (green machine) x8 minutes with PT present to discuss status Seated with 2# ankle weights:  heel/toe raises, marching, LAQ, hip abduction scissors.  BLE 2x10 reps seated hamstring stretch  2x20 sec  Lt piriformis stretch in sitting 2x30 seconds Trigger Point Dry-Needling  Treatment instructions: Expect mild to moderate muscle soreness. S/S of pneumothorax if dry needled over a lung field, and to seek immediate medical attention should they occur. Patient verbalized understanding of these instructions and education. Patient Consent Given: Yes Education handout provided: Previously provided Muscles treated: Bil lumbar paraspinals and Rt gluteals Treatment response/outcome: Utilized skilled palpation  to identify trigger points.  During dry needling able to palpate muscle twitch and muscle elongation  Skilled palpation and monitoring by PT during dry needling    PATIENT EDUCATION:  Education details: Access Code: [email or web address] Person educated: Patient Education method: Consulting civil engineer, Media planner, and Handouts Education comprehension: verbalized understanding and returned demonstration     HOME EXERCISE PROGRAM: Access Code: [email or web address] URL: [email or web address] Date: 09/19/2021 Prepared by: Claiborne Billings  Exercises - Supine Lower Trunk Rotation  - 3 x daily - 7 x weekly - 1 sets - 3 reps - 20 hold - Hooklying Single Knee to Chest  - 3 x daily - 7 x weekly - 1 sets - 3 reps - 20 hold - Seated Hamstring Stretch  - 3 x daily - 7 x weekly - 1 sets - 3 reps - 20 hold - Sit to Stand Without Arm Support  - 2 x daily - 7 x weekly - 2 sets - 5 reps - Clamshell  - 2 x daily - 7 x weekly - 2 sets - 5 reps   ASSESSMENT:   CLINICAL IMPRESSION: Rhys presents to skilled rehabilitation with overall reports of continuing to feel better.  Pt with improvement noted on FOTO score and with 5 times sit to/from stand.  Pt is eager to return to aquatic PT, as she has not had a session in a few weeks.  Pt tolerated new core strengthening exercises well and reported deceased pain following exercise stating that she felt like she had more mobility in her back.  Pt continues to require skilled PT to progress towards goal related activities.     OBJECTIVE IMPAIRMENTS Abnormal gait, decreased activity tolerance, decreased strength, increased muscle spasms, impaired flexibility, postural dysfunction, and pain.    ACTIVITY LIMITATIONS carrying, lifting, standing, squatting, and stairs   PARTICIPATION LIMITATIONS: meal prep, cleaning, laundry, and community activity   PERSONAL FACTORS Age and 1-2 comorbidities: chronic LBP, lumbar fusion, legally blind  are also affecting patient's functional outcome.     REHAB POTENTIAL: Good   CLINICAL DECISION MAKING: Evolving/moderate complexity   EVALUATION COMPLEXITY: Moderate     GOALS: Goals reviewed with patient? Yes   SHORT TERM GOALS: Target date: 09/24/2021   Be independent in initial HEP Baseline: Goal status: MET   2.  Report > or = to 25% reduction in LBP with standing and walking Baseline: 25% reported (09/19/21) Goal status: MET   3.  Verbalize and demonstrate postural corrections to improve standing posture for ADLs and gait  Baseline: flexed trunk in standing  Goal status: Goal Met       LONG TERM GOALS: Target date: 12/14/2021   Be independent in advanced HEP Baseline:  Goal status: Ongoing   2.  Increase FOTO to > or =  to 44 Baseline: 37 Goal status: Goal Met 11/13/2021   3.  Report > or = to 50% reduction in LBP with standing and walking  Baseline: 6-7/10 Goal status: INITIAL   4.  Demonstrate erect standing posture due to improved endurance and strength > or = to 50% of the time Baseline:  Goal status: INITIAL   5.  Perform 5x sit to stand in < or = to 13 seconds to improve balance and mobility  Baseline: 21 seconds  Goal status: Goal Met 11/13/2021         PLAN: PT FREQUENCY: 1-2x/week   PT DURATION: 8 weeks   PLANNED INTERVENTIONS: Therapeutic exercises, Therapeutic activity, Neuromuscular re-education, Balance training, Gait training, Patient/Family education, Joint mobilization, Stair training, Aquatic Therapy, Dry Needling, Electrical stimulation, Spinal manipulation, Spinal mobilization, Cryotherapy, Moist heat, Taping, and Manual therapy.   PLAN FOR NEXT SESSION:  Aquatic PT, update HEP as indicated, Continue functional and core strength, flexibility and DN/manual therapy as needed.     Juel Burrow, PT 11/13/21 11:04 AM   [facility] [address], [place], [place] [address] Phone # [phone] Fax [phone]

## 2021-11-15 ENCOUNTER — Ambulatory Visit (HOSPITAL_BASED_OUTPATIENT_CLINIC_OR_DEPARTMENT_OTHER): Payer: Medicare HMO | Attending: Physical Medicine & Rehabilitation | Admitting: Physical Therapy

## 2021-11-15 ENCOUNTER — Encounter (HOSPITAL_BASED_OUTPATIENT_CLINIC_OR_DEPARTMENT_OTHER): Payer: Self-pay | Admitting: Physical Therapy

## 2021-11-15 DIAGNOSIS — M5459 Other low back pain: Secondary | ICD-10-CM | POA: Diagnosis present

## 2021-11-15 DIAGNOSIS — M6281 Muscle weakness (generalized): Secondary | ICD-10-CM | POA: Insufficient documentation

## 2021-11-15 DIAGNOSIS — R2689 Other abnormalities of gait and mobility: Secondary | ICD-10-CM | POA: Diagnosis present

## 2021-11-15 NOTE — Therapy (Signed)
OUTPATIENT PHYSICAL THERAPY TREATMENT NOTE   Patient Name: Emily Peck MRN: [id number] DOB:05/27/1955, 66 y.o., female Today's Date: 11/15/2021  PCP: Williams, Tomika, NP REFERRING PROVIDER: Kirsteins, Andrew,  MD     END OF SESSION:   PT End of Session - 11/15/21 0855     Visit Number 14    Date for PT Re-Evaluation 12/14/21    Authorization Type [employer]    Authorization Time Period 16 visits 8/21-10/13/23    Authorization - Visit Number 5    Authorization - Number of Visits 16    Progress Note Due on Visit 23    PT Start Time 0855    PT Stop Time 0935    PT Time Calculation (min) 40 min    Activity Tolerance Patient tolerated treatment well    Behavior During Therapy WFL for tasks assessed/performed                 Past Medical History:  Diagnosis Date   Anxiety    Clotting disorder (HCC)    Depression    Dyspnea    GERD (gastroesophageal reflux disease)    Headache    Lumbosacral spondylosis without myelopathy    Neuropathy    Postlaminectomy syndrome, lumbar region    Postlaminectomy syndrome, thoracic region    Stroke (HCC)    Past Surgical History:  Procedure Laterality Date   ABDOMINAL HYSTERECTOMY     BACK SURGERY     BIOPSY  10/02/2020   Procedure: BIOPSY;  Surgeon: Mann, Jyothi, MD;  Location: [facility];  Service: Endoscopy;;   COLONOSCOPY WITH PROPOFOL N/A 10/02/2020   Procedure: COLONOSCOPY WITH PROPOFOL;  Surgeon: Mann, Jyothi, MD;  Location: [facility];  Service: Endoscopy;  Laterality: N/A;   ESOPHAGOGASTRODUODENOSCOPY (EGD) WITH PROPOFOL N/A 10/02/2020   Procedure: ESOPHAGOGASTRODUODENOSCOPY (EGD) WITH PROPOFOL;  Surgeon: Mann, Jyothi, MD;  Location: [facility];  Service: Endoscopy;  Laterality: N/A;   EYE SURGERY     eye removed right    GIVENS CAPSULE STUDY N/A 10/02/2020   Procedure: GIVENS CAPSULE STUDY;  Surgeon: Mann, Jyothi, MD;  Location: [facility];  Service: Endoscopy;  Laterality: N/A;   KNEE ARTHROSCOPY     TOTAL KNEE  ARTHROPLASTY Right 01/09/2016   Procedure: TOTAL KNEE ARTHROPLASTY;  Surgeon: Peter Dalldorf, MD;  Location: [facility];  Service: Orthopedics;  Laterality: Right;   Patient Active Problem List   Diagnosis Date Noted   Iron deficiency anemia 10/23/2020   Symptomatic anemia 09/30/2020   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   Category 4 blindness of left eye 04/19/2019   Nocturia more than twice per night 04/19/2019   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   Nasal septal deviation 04/19/2019   Excessive daytime sleepiness 04/19/2019   Neuropathy 04/19/2019   Sleep-related headache 04/19/2019   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HCC) 04/19/2019   Rotator cuff syndrome of both shoulders 06/09/2018   Cervicalgia 03/19/2018   Disorder of rotator cuff syndrome of right shoulder and allied disorder 01/09/2018   Primary osteoarthritis of right knee 01/09/2016   Peripheral polyneuropathy 10/19/2015   Knee pain, chronic 06/11/2013   Thoracic postlaminectomy syndrome 06/11/2013   Pes planus of both feet 06/11/2013   Trochanteric bursitis of both hips 11/06/2012   Lumbosacral spondylosis without myelopathy 05/09/2011   DEPRESSION 07/24/2006   CARPAL TUNNEL SYNDROME, RIGHT 07/24/2006   MYOPIA, 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   DEGENERATIVE JOINT DISEASE 07/24/2006   DISORDER, LUMBAR DISC W/MYELOPATHY 07/24/2006   Cerebral venous sinus thrombosis 07/24/2006   AVULSION, EYE   12/23/2005    REFERRING DIAG: W96.759 (ICD-10-CM) - Lumbosacral spondylosis without myelopathy  THERAPY DIAG:  Other low back pain  Other abnormalities of gait and mobility  Muscle weakness (generalized)  Rationale for Evaluation and Treatment Rehabilitation  PERTINENT HISTORY: 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  Requires SBA for mobility  SUBJECTIVE:   "[facility]".  [facility]     PAIN:  Are you having pain? No (at rest) Pain scale:  0-/10 Location: Lower back and right hip/groin Description:   Aggravating factor: standing too long, riding on [employer] Alleviating factor: laying in bed   Bilat knees are painful only when WB - 6/10; 0/10 at rest  PATIENT GOALS:  reduce LBP  OBJECTIVE: (objective measures completed at initial evaluation unless otherwise dated)   DIAGNOSTIC FINDINGS:  MRI 07/02/21: Progressive disc degeneration at L2-3 with mild spinal and left neural foraminal stenosis. 2. Severe L4-5 facet arthrosis with new trace anterolisthesis. No stenosis.   PATIENT SURVEYS:  08/27/21:  FOTO 37 (goal is 44) 10/18/2021:  FOTO 40% 11/13/2021:  FOTO 57%     MUSCLE LENGTH: Hamstrings limited by 25-50% with pain    POSTURE: rounded shoulders, forward head, and flexed trunk    PALPATION: Diffuse palpable tenderness over bil lumbar paraspinals and gluteals    LUMBAR ROM:    Not tested due to balance deficits.    LOWER EXTREMITY ROM:      Limited by 25-50% bilaterally with pain    LOWER EXTREMITY MMT:     Bil hips 4-/5 with pain, knees 4/5 with pain   LUMBAR SPECIAL TESTS:  Straight leg raise test: Positive and Slump test: Positive bil- due to pain with all movement    FUNCTIONAL TESTS:  08/27/21:  5x sit to stand: 21 seconds with use of hands  10/18/2021:  5 times sit to/from stand:  19.2 sec without use of UE 11/13/2021:  5 times sit to/from stand:  9.4 sec without use of UE   GAIT: Distance walked: 100 Assistive device utilized: Single point cane Level of assistance: CGA (secondary to vision) Comments: flexed trunk and antalgic gait       TODAY'S TREATMENT: 11/15/21  Pt seen for aquatic therapy today.  Treatment took place in water 3.25-4.5 ft in depth at the [facility]. Temp of water was 91.  Pt entered/exited the pool via stairs with step-to  pattern with supervision and bilat rail.   * walking forward / backward, side stepping, and high knee marching - 3 laps of each, UE on white floatation barbell  * Holding wall:  heel/toe raises x 20;  squats x 15 hip ext x 10; hip abdct x 10;  * sitting on bench in water:  TUG like exercise working on upright gait with increased speed, with rainbow hand buoys x 1 rep  * STS from 4th step with CGA assist x 5 reps * Seated on 4th step:  hip abdct/add; cycling LEs * in 67f water:  holding rainbow hand buoys under water at side, forward walking (challenge); hand buoys at surface - 1 width of light jog, 2 laps of walking forward/ backward;  shoulder abdct/add with rainbow hand buoys ( challenge);   Wide stance without UE support (very challenging), with back near wall -  then with added arm abdct/ add without resistance with CGA/SBA  Pt requires buoyancy for support and to offload joints with strengthening/balance exercises. Viscosity of the water is needed for resistance of strengthening; water current perturbations provides challenge to standing balance unsupported, requiring increased core activation.   11/13/2021: Nustep level 5 (green machine) x8 minutes with PT present to discuss status Seated with 2# ankle weights:  heel/toe raises, marching, LAQ, hip abduction scissors.  BLE 2x10 reps Piriformis stretch in sitting 2x20 sec bilat Seated hamstring stretch 2x20 sec bilat Sit to/from stand x5 reps Seated pushing purple ball against thighs for TA contraction 2x10 Seated hip to hip with 5# kettlebell 3x10, Seated hip to shoulder x10 bilat   11/07/2021: Nustep level 5 (green machine) x8 minutes with PT present to discuss status Seated with 2# ankle weights:  heel/toe raises, marching, LAQ, hip abduction scissors.  BLE 2x10 reps seated hamstring stretch  2x20 sec  Lt piriformis stretch in sitting 2x30 seconds Trigger Point Dry-Needling  Treatment instructions: Expect mild to moderate muscle  soreness. S/S of pneumothorax if dry needled over a lung field, and to seek immediate medical attention should they occur. Patient verbalized understanding of these instructions and education. Patient Consent Given: Yes Education handout provided: Previously provided Muscles treated: Bil lumbar paraspinals and Rt gluteals Treatment response/outcome: Utilized skilled palpation to identify trigger points.  During dry needling able to palpate muscle twitch and muscle elongation  Skilled palpation and monitoring by PT during dry needling   11/01/2021: Nustep level 5 (green machine) x8 minutes with PT present to discuss status Seated with 2# ankle weights:  heel/toe raises, marching, LAQ, hip abduction scissors.  BLE 2x10 reps seated hamstring stretch  2x20 sec  Lt piriformis stretch in sitting 2x30 seconds Trigger Point Dry-Needling  Treatment instructions: Expect mild to moderate muscle soreness. S/S of pneumothorax if dry needled over a lung field, and to seek immediate medical attention should they occur. Patient verbalized understanding of these instructions and education. Patient Consent Given: Yes Education handout provided: Previously provided Muscles treated: Bil lumbar paraspinals and Rt gluteals Treatment response/outcome: Utilized skilled palpation to identify trigger points.  During dry needling able to palpate muscle twitch and muscle elongation  Skilled palpation and monitoring by PT during dry needling    PATIENT EDUCATION:  Education details: Access Code: [email or web address] Person educated: Patient Education method: Explanation, Demonstration Education comprehension: verbalized understanding and returned demonstration     HOME EXERCISE PROGRAM: Access Code: [email or web address] URL: [email or web address] Date: 09/19/2021 Prepared by: Kelly  Exercises - Supine Lower Trunk Rotation  - 3 x daily - 7 x weekly - 1 sets - 3 reps - 20 hold - Hooklying Single Knee to Chest  - 3 x daily  - 7 x weekly - 1 sets - 3 reps - 20 hold - Seated Hamstring Stretch  - 3 x daily - 7 x weekly - 1 sets - 3 reps - 20 hold - Sit to Stand Without Arm Support  - 2 x daily - 7 x weekly - 2 sets - 5 reps - Clamshell  - 2 x daily - 7 x weekly - 2 sets - 5 reps   ASSESSMENT:   CLINICAL IMPRESSION: Pt voiced interest in attending water aerobics once discharged from therapy since she gets relief of back pain while she is in water.  Discussed safety concerns due to her visual impairment, and how we'd need to attempt more time with therapist providing more distant supervision than SBA since she   [facility].    Trialed some balance exercises today; very challenged with wide stance with arm motions.  Pt able to self correct, with therapist providing SBA/CGA in water with her. No increase in pain in back or knees while in water.   Pt continues to require skilled PT to progress towards goal related activities.     OBJECTIVE IMPAIRMENTS Abnormal gait, decreased activity tolerance, decreased strength, increased muscle spasms, impaired flexibility, postural dysfunction, and pain.    ACTIVITY LIMITATIONS carrying, lifting, standing, squatting, and stairs   PARTICIPATION LIMITATIONS: meal prep, cleaning, laundry, and community activity   PERSONAL FACTORS Age and 1-2 comorbidities: chronic LBP, lumbar fusion, legally blind  are also affecting patient's functional outcome.    REHAB POTENTIAL: Good   CLINICAL DECISION MAKING: Evolving/moderate complexity   EVALUATION COMPLEXITY: Moderate     GOALS: Goals reviewed with patient? Yes   SHORT TERM GOALS: Target date: 09/24/2021   Be independent in initial HEP Baseline: Goal status: MET   2.  Report > or = to 25% reduction in LBP with standing and walking Baseline: 25% reported (09/19/21) Goal status: MET   3.  Verbalize and demonstrate postural corrections to improve standing posture for ADLs and gait  Baseline: flexed trunk in standing   Goal status: Goal Met       LONG TERM GOALS: Target date: 12/14/2021   Be independent in advanced HEP Baseline:  Goal status: Ongoing   2.  Increase FOTO to > or = to 44 Baseline: 37 Goal status: Goal Met 11/13/2021   3.  Report > or = to 50% reduction in LBP with standing and walking  Baseline: 6-7/10 Goal status: INITIAL   4.  Demonstrate erect standing posture due to improved endurance and strength > or = to 50% of the time Baseline:  Goal status: INITIAL   5.  Perform 5x sit to stand in < or = to 13 seconds to improve balance and mobility  Baseline: 21 seconds  Goal status: Goal Met 11/13/2021         PLAN: PT FREQUENCY: 1-2x/week   PT DURATION: 8 weeks   PLANNED INTERVENTIONS: Therapeutic exercises, Therapeutic activity, Neuromuscular re-education, Balance training, Gait training, Patient/Family education, Joint mobilization, Stair training, Aquatic Therapy, Dry Needling, Electrical stimulation, Spinal manipulation, Spinal mobilization, Cryotherapy, Moist heat, Taping, and Manual therapy.   PLAN FOR NEXT SESSION:  Aquatic PT, update HEP as indicated, Continue functional and core strength, flexibility and DN/manual therapy as needed.     Carlson-Long, PTA 11/15/21 1:42 PM [facility] [id number] [place], [place], [address] Phone: [phone]   Fax:  [phone]  

## 2021-11-20 ENCOUNTER — Encounter (HOSPITAL_BASED_OUTPATIENT_CLINIC_OR_DEPARTMENT_OTHER): Payer: Self-pay | Admitting: Physical Therapy

## 2021-11-20 ENCOUNTER — Ambulatory Visit (HOSPITAL_BASED_OUTPATIENT_CLINIC_OR_DEPARTMENT_OTHER): Payer: Medicare HMO | Admitting: Physical Therapy

## 2021-11-20 DIAGNOSIS — M6281 Muscle weakness (generalized): Secondary | ICD-10-CM

## 2021-11-20 DIAGNOSIS — M5459 Other low back pain: Secondary | ICD-10-CM

## 2021-11-20 DIAGNOSIS — R2689 Other abnormalities of gait and mobility: Secondary | ICD-10-CM

## 2021-11-20 NOTE — Therapy (Signed)
OUTPATIENT PHYSICAL THERAPY TREATMENT NOTE   Patient Name: Emily Peck MRN: [id number] DOB:1955-03-30, 66 y.o., female Today's Date: 11/20/2021  PCP: Darrol Jump, NP REFERRING PROVIDER: Alysia Penna,  MD     END OF SESSION:   PT End of Session - 11/20/21 0903     Visit Number 15    Date for PT Re-Evaluation 12/14/21    Authorization Type [employer]    Authorization Time Period 16 visits 8/21-10/13/23    Authorization - Visit Number 6    Authorization - Number of Visits 16    Progress Note Due on Visit 23    PT Start Time 0902    PT Stop Time 0945    PT Time Calculation (min) 43 min    Activity Tolerance Patient tolerated treatment well    Behavior During Therapy WFL for tasks assessed/performed                 Past Medical History:  Diagnosis Date   Anxiety    Clotting disorder ([place])    Depression    Dyspnea    GERD (gastroesophageal reflux disease)    Headache    Lumbosacral spondylosis without myelopathy    Neuropathy    Postlaminectomy syndrome, lumbar region    Postlaminectomy syndrome, thoracic region    Stroke [facility])    Past Surgical History:  Procedure Laterality Date   ABDOMINAL HYSTERECTOMY     BACK SURGERY     BIOPSY  10/02/2020   Procedure: BIOPSY;  Surgeon: Juanita Craver, MD;  Location: [place];  Service: Endoscopy;;   COLONOSCOPY WITH PROPOFOL N/A 10/02/2020   Procedure: COLONOSCOPY WITH PROPOFOL;  Surgeon: Juanita Craver, MD;  Location: [facility]COPY;  Service: Endoscopy;  Laterality: N/A;   ESOPHAGOGASTRODUODENOSCOPY (EGD) WITH PROPOFOL N/A 10/02/2020   Procedure: ESOPHAGOGASTRODUODENOSCOPY (EGD) WITH PROPOFOL;  Surgeon: Juanita Craver, MD;  Location: [facility] ENDOSCOPY;  Service: Endoscopy;  Laterality: N/A;   EYE SURGERY     eye removed right    GIVENS CAPSULE STUDY N/A 10/02/2020   Procedure: GIVENS CAPSULE STUDY;  Surgeon: Juanita Craver, MD;  Location: [facility] ENDOSCOPY;  Service: Endoscopy;  Laterality: N/A;   KNEE ARTHROSCOPY     TOTAL KNEE  ARTHROPLASTY Right 01/09/2016   Procedure: TOTAL KNEE ARTHROPLASTY;  Surgeon: Melrose Nakayama, MD;  Location: [place];  Service: Orthopedics;  Laterality: Right;   Patient Active Problem List   Diagnosis Date Noted   Iron deficiency anemia 10/23/2020   Symptomatic anemia 09/30/2020   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   Category 4 blindness of left eye 04/19/2019   Nocturia more than twice per night 04/19/2019   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   Nasal septal deviation 04/19/2019   Excessive daytime sleepiness 04/19/2019   Neuropathy 04/19/2019   Sleep-related headache 04/19/2019   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[place]) 04/19/2019   Rotator cuff syndrome of both shoulders 06/09/2018   Cervicalgia 03/19/2018   Disorder of rotator cuff syndrome of right shoulder and allied disorder 01/09/2018   Primary osteoarthritis of right knee 01/09/2016   Peripheral polyneuropathy 10/19/2015   Knee pain, chronic 06/11/2013   Thoracic postlaminectomy syndrome 06/11/2013   Pes planus of both feet 06/11/2013   Trochanteric bursitis of both hips 11/06/2012   Lumbosacral spondylosis without myelopathy 05/09/2011   DEPRESSION 07/24/2006   CARPAL TUNNEL SYNDROME, RIGHT 07/24/2006   MYOPIA, 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   DEGENERATIVE JOINT DISEASE 07/24/2006   DISORDER, LUMBAR DISC W/MYELOPATHY 07/24/2006   Cerebral venous sinus thrombosis 07/24/2006   AVULSION, EYE  12/23/2005    REFERRING DIAG: T62.263 (ICD-10-CM) - Lumbosacral spondylosis without myelopathy  THERAPY DIAG:  Other low back pain  Other abnormalities of gait and mobility  Muscle weakness (generalized)  Rationale for Evaluation and Treatment Rehabilitation  PERTINENT HISTORY: 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  Requires SBA for mobility  SUBJECTIVE:   "I'm feeling good! No pain"      PAIN:  Are you having pain? No (at rest) Pain scale:  0-/10 Location: Lower back and right hip/groin Description:   Aggravating factor: standing too long, riding on SCAT Alleviating factor: laying in bed   Bilat knees are painful only when WB - 6/10; 0/10 at rest  PATIENT GOALS:  reduce LBP  OBJECTIVE: (objective measures completed at initial evaluation unless otherwise dated)   DIAGNOSTIC FINDINGS:  MRI 07/02/21: Progressive disc degeneration at L2-3 with mild spinal and left neural foraminal stenosis. 2. Severe L4-5 facet arthrosis with new trace anterolisthesis. No stenosis.   PATIENT SURVEYS:  08/27/21:  FOTO 37 (goal is 44) 10/18/2021:  FOTO 40% 11/13/2021:  FOTO 57%     MUSCLE LENGTH: Hamstrings limited by 25-50% with pain    POSTURE: rounded shoulders, forward head, and flexed trunk    PALPATION: Diffuse palpable tenderness over bil lumbar paraspinals and gluteals    LUMBAR ROM:    Not tested due to balance deficits.    LOWER EXTREMITY ROM:      Limited by 25-50% bilaterally with pain    LOWER EXTREMITY MMT:     Bil hips 4-/5 with pain, knees 4/5 with pain   LUMBAR SPECIAL TESTS:  Straight leg raise test: Positive and Slump test: Positive bil- due to pain with all movement    FUNCTIONAL TESTS:  08/27/21:  5x sit to stand: 21 seconds with use of hands  10/18/2021:  5 times sit to/from stand:  19.2 sec without use of UE 11/13/2021:  5 times sit to/from stand:  9.4 sec without use of UE   GAIT: Distance walked: 100 Assistive device utilized: Single point cane Level of assistance: CGA (secondary to vision) Comments: flexed trunk and antalgic gait       TODAY'S TREATMENT: 11/20/21:  Pt seen for aquatic therapy today.  Treatment took place in water 3.25-4.5 ft in depth at the [facility]. Temp of water was 93.  Pt entered/exited the pool via stairs with step-to pattern with supervision and bilat rail.   * Holding wall:  heel/toe raises  x 20;  squats x 15;  hip ext x 10; hip abdct x 10; hip openers (hip abdct/knee flexion) * walking forward, side stepping, and high knee marching - 3 laps of each, UE on white floatation barbell   * rest holding wall in squat position x 1 min  * with back near wall holding rainbow hand buoys: bilat row motion, horizontal abdct/ add, abdct/add x 10 x 2  * rest holding wall in squat position x 1 min * sitting on bench in water with feet on blue: STS holding white barbell x 10, decreasing CGA from therapist (steadying barbell) to SBA at pool edge   Pt requires buoyancy for support and to offload joints with strengthening/balance exercises. Viscosity of the water is needed for resistance of strengthening; water current perturbations provides challenge to standing balance unsupported, requiring increased core activation.   11/15/21  Pt seen for aquatic therapy today.  Treatment took place in water 3.25-4.5 ft in depth at the [facility]. Temp of water was 91.  Pt entered/exited the pool via stairs with step-to pattern with supervision and bilat rail.   * walking forward / backward, side stepping, and high knee marching - 3 laps of each, UE on white floatation barbell  * Holding wall:  heel/toe raises x 20;  squats x 15 hip ext x 10; hip abdct x 10;  * sitting on bench in water:  TUG like exercise working on upright gait with increased speed, with rainbow hand buoys x 1 rep  * STS from 4th step with CGA assist x 5 reps * Seated on 4th step:  hip abdct/add; cycling LEs * in 58f water:  holding rainbow hand buoys under water at side, forward walking (challenge); hand buoys at surface - 1 width of light jog, 2 laps of walking forward/ backward;  shoulder abdct/add with rainbow hand buoys ( challenge);   Wide stance without UE support (very challenging), with back near wall - then with added arm abdct/ add without resistance with CGA/SBA   11/13/2021: Nustep level 5 (green machine) x8  minutes with PT present to discuss status Seated with 2# ankle weights:  heel/toe raises, marching, LAQ, hip abduction scissors.  BLE 2x10 reps Piriformis stretch in sitting 2x20 sec bilat Seated hamstring stretch 2x20 sec bilat Sit to/from stand x5 reps Seated pushing purple ball against thighs for TA contraction 2x10 Seated hip to hip with 5# kettlebell 3x10, Seated hip to shoulder x10 bilat   11/07/2021: Nustep level 5 (green machine) x8 minutes with PT present to discuss status Seated with 2# ankle weights:  heel/toe raises, marching, LAQ, hip abduction scissors.  BLE 2x10 reps seated hamstring stretch  2x20 sec  Lt piriformis stretch in sitting 2x30 seconds Trigger Point Dry-Needling  Treatment instructions: Expect mild to moderate muscle soreness. S/S of pneumothorax if dry needled over a lung field, and to seek immediate medical attention should they occur. Patient verbalized understanding of these instructions and education. Patient Consent Given: Yes Education handout provided: Previously provided Muscles treated: Bil lumbar paraspinals and Rt gluteals Treatment response/outcome: Utilized skilled palpation to identify trigger points.  During dry needling able to palpate muscle twitch and muscle elongation  Skilled palpation and monitoring by PT during dry needling   11/01/2021: Nustep level 5 (green machine) x8 minutes with PT present to discuss status Seated with 2# ankle weights:  heel/toe raises, marching, LAQ, hip abduction scissors.  BLE 2x10 reps seated hamstring stretch  2x20 sec  Lt piriformis stretch in sitting 2x30 seconds Trigger Point Dry-Needling  Treatment instructions: Expect mild to moderate muscle soreness. S/S of pneumothorax if dry needled over a lung field, and to seek immediate medical attention should they occur. Patient verbalized understanding of these instructions and education. Patient Consent Given: Yes Education handout provided: Previously  provided Muscles treated: Bil lumbar paraspinals and Rt gluteals Treatment response/outcome: Utilized skilled palpation to identify trigger points.  During dry needling able to palpate muscle twitch and muscle elongation  Skilled palpation and monitoring by PT during dry needling    PATIENT EDUCATION:  Education details: aquatics progressions Person educated: Patient Education method: EConsulting civil engineer Demonstration Education comprehension: verbalized understanding and returned demonstration     HOME EXERCISE PROGRAM: Access Code: [email or web address] URL: [email or web address] Date: 09/19/2021 Prepared by: KClaiborne Billings Exercises - Supine Lower Trunk Rotation  - 3 x daily - 7 x weekly - 1 sets - 3 reps - 20  hold - Hooklying Single Knee to Chest  - 3 x daily - 7 x weekly - 1 sets - 3 reps - 20 hold - Seated Hamstring Stretch  - 3 x daily - 7 x weekly - 1 sets - 3 reps - 20 hold - Sit to Stand Without Arm Support  - 2 x daily - 7 x weekly - 2 sets - 5 reps - Clamshell  - 2 x daily - 7 x weekly - 2 sets - 5 reps   ASSESSMENT:   CLINICAL IMPRESSION: Trialed pt in water by self, directed by therapist on deck in attempts to prepare her for more independent water exercises at d/c.   Back fatigued with UE work, with unsupported back but near wall for safety.  She reported some back discomfort / tightness at end of session.  No overt LOB during session.   Pt continues to require skilled PT to progress towards goal related activities.       OBJECTIVE IMPAIRMENTS Abnormal gait, decreased activity tolerance, decreased strength, increased muscle spasms, impaired flexibility, postural dysfunction, and pain.    ACTIVITY LIMITATIONS carrying, lifting, standing, squatting, and stairs   PARTICIPATION LIMITATIONS: meal prep, cleaning, laundry, and community activity   PERSONAL FACTORS Age and 1-2 comorbidities: chronic LBP, lumbar fusion, legally blind  are also affecting patient's functional outcome.     REHAB POTENTIAL: Good   CLINICAL DECISION MAKING: Evolving/moderate complexity   EVALUATION COMPLEXITY: Moderate     GOALS: Goals reviewed with patient? Yes   SHORT TERM GOALS: Target date: 09/24/2021   Be independent in initial HEP Baseline: Goal status: MET   2.  Report > or = to 25% reduction in LBP with standing and walking Baseline: 25% reported (09/19/21) Goal status: MET   3.  Verbalize and demonstrate postural corrections to improve standing posture for ADLs and gait  Baseline: flexed trunk in standing  Goal status: Goal Met       LONG TERM GOALS: Target date: 12/14/2021   Be independent in advanced HEP Baseline:  Goal status: Ongoing   2.  Increase FOTO to > or = to 44 Baseline: 37 Goal status: Goal Met 11/13/2021   3.  Report > or = to 50% reduction in LBP with standing and walking  Baseline: 6-7/10 Goal status: INITIAL   4.  Demonstrate erect standing posture due to improved endurance and strength > or = to 50% of the time Baseline:  Goal status: INITIAL   5.  Perform 5x sit to stand in < or = to 13 seconds to improve balance and mobility  Baseline: 21 seconds  Goal status: Goal Met 11/13/2021         PLAN: PT FREQUENCY: 1-2x/week   PT DURATION: 8 weeks   PLANNED INTERVENTIONS: Therapeutic exercises, Therapeutic activity, Neuromuscular re-education, Balance training, Gait training, Patient/Family education, Joint mobilization, Stair training, Aquatic Therapy, Dry Needling, Electrical stimulation, Spinal manipulation, Spinal mobilization, Cryotherapy, Moist heat, Taping, and Manual therapy.   PLAN FOR NEXT SESSION:  Aquatic PT, update HEP as indicated, Continue functional and core strength, flexibility and DN/manual therapy as needed.    Kerin Perna, PTA 11/20/21 9:46 AM [facility] [place], [place], [address] Phone: [phone]   Fax:  [phone]

## 2021-11-21 ENCOUNTER — Ambulatory Visit: Payer: Medicare HMO | Admitting: Registered Nurse

## 2021-11-22 ENCOUNTER — Ambulatory Visit: Payer: Medicare HMO | Admitting: Physical Therapy

## 2021-11-22 DIAGNOSIS — M6281 Muscle weakness (generalized): Secondary | ICD-10-CM

## 2021-11-22 DIAGNOSIS — R2689 Other abnormalities of gait and mobility: Secondary | ICD-10-CM

## 2021-11-22 DIAGNOSIS — M5459 Other low back pain: Secondary | ICD-10-CM | POA: Diagnosis not present

## 2021-11-22 NOTE — Therapy (Signed)
OUTPATIENT PHYSICAL THERAPY TREATMENT NOTE   Patient Name: Emily Peck MRN: [id number] DOB:[birth year], 66 y.o., female Today's Date: 11/22/2021  PCP: Darrol Jump, NP REFERRING PROVIDER: Alysia Penna,  MD     END OF SESSION:   PT End of Session - 11/22/21 1059     Visit Number 16    Date for PT Re-Evaluation 12/14/21    Authorization Type [employer]    Authorization Time Period 16 visits 8/21-10/13/23    Authorization - Visit Number 7    Authorization - Number of Visits 16    Progress Note Due on Visit 23    PT Start Time 1100    PT Stop Time 1140    PT Time Calculation (min) 40 min    Activity Tolerance Patient tolerated treatment well                 Past Medical History:  Diagnosis Date   Anxiety    Clotting disorder ([place])    Depression    Dyspnea    GERD (gastroesophageal reflux disease)    Headache    Lumbosacral spondylosis without myelopathy    Neuropathy    Postlaminectomy syndrome, lumbar region    Postlaminectomy syndrome, thoracic region    Stroke [facility])    Past Surgical History:  Procedure Laterality Date   ABDOMINAL HYSTERECTOMY     BACK SURGERY     BIOPSY  10/02/2020   Procedure: BIOPSY;  Surgeon: Juanita Craver, MD;  Location: [place];  Service: Endoscopy;;   COLONOSCOPY WITH PROPOFOL N/A 10/02/2020   Procedure: COLONOSCOPY WITH PROPOFOL;  Surgeon: Juanita Craver, MD;  Location: [facility] ENDOSCOPY;  Service: Endoscopy;  Laterality: N/A;   ESOPHAGOGASTRODUODENOSCOPY (EGD) WITH PROPOFOL N/A 10/02/2020   Procedure: ESOPHAGOGASTRODUODENOSCOPY (EGD) WITH PROPOFOL;  Surgeon: Juanita Craver, MD;  Location: [facility] ENDOSCOPY;  Service: Endoscopy;  Laterality: N/A;   EYE SURGERY     eye removed right    GIVENS CAPSULE STUDY N/A 10/02/2020   Procedure: GIVENS CAPSULE STUDY;  Surgeon: Juanita Craver, MD;  Location: [facility] ENDOSCOPY;  Service: Endoscopy;  Laterality: N/A;   KNEE ARTHROSCOPY     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   Procedure: TOTAL KNEE  ARTHROPLASTY;  Surgeon: Melrose Nakayama, MD;  Location: [place];  Service: Orthopedics;  Laterality: Right;   Patient Active Problem List   Diagnosis Date Noted   Iron deficiency anemia 10/23/2020   Symptomatic anemia 09/30/2020   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   Category 4 blindness of left eye 04/19/2019   Nocturia more than twice per night 04/19/2019   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   Nasal septal deviation 04/19/2019   Excessive daytime sleepiness 04/19/2019   Neuropathy 04/19/2019   Sleep-related headache 04/19/2019   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[place]) 04/19/2019   Rotator cuff syndrome of both shoulders 06/09/2018   Cervicalgia 03/19/2018   Disorder of rotator cuff syndrome of right shoulder and allied disorder 01/09/2018   Primary osteoarthritis of right knee 01/09/2016   Peripheral polyneuropathy 10/19/2015   Knee pain, chronic 06/11/2013   Thoracic postlaminectomy syndrome 06/11/2013   Pes planus of both feet 06/11/2013   Trochanteric bursitis of both hips 11/06/2012   Lumbosacral spondylosis without myelopathy 05/09/2011   DEPRESSION 07/24/2006   CARPAL TUNNEL SYNDROME, RIGHT 07/24/2006   MYOPIA, 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   DEGENERATIVE JOINT DISEASE 07/24/2006   DISORDER, LUMBAR DISC W/MYELOPATHY 07/24/2006   Cerebral venous sinus thrombosis 07/24/2006   AVULSION, EYE 12/23/2005    REFERRING DIAG: M47.817 (ICD-10-CM) - Lumbosacral  spondylosis without myelopathy  THERAPY DIAG:  Other low back pain  Other abnormalities of gait and mobility  Muscle weakness (generalized)  Rationale for Evaluation and Treatment Rehabilitation  PERTINENT HISTORY: 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  Requires SBA for mobility  SUBJECTIVE:   tailbone pain with sitting.  I use a donut thing at home.   I love the pool.  I have an  event this weekend and I'm hoping you'll needle me so I can enjoy myself.     PAIN:  Are you having pain? yes Pain scale:  2/10 Location: Lower back and tailbone Description:   Aggravating factor: standing too long, riding on SCAT Alleviating factor: laying in bed   Bilat knees are painful only when WB - 6/10; 0/10 at rest  PATIENT GOALS:  reduce LBP  OBJECTIVE: (objective measures completed at initial evaluation unless otherwise dated)   DIAGNOSTIC FINDINGS:  MRI 07/02/21: Progressive disc degeneration at L2-3 with mild spinal and left neural foraminal stenosis. 2. Severe L4-5 facet arthrosis with new trace anterolisthesis. No stenosis.   PATIENT SURVEYS:  08/27/21:  FOTO 37 (goal is 44) 10/18/2021:  FOTO 40% 11/13/2021:  FOTO 57%     MUSCLE LENGTH: Hamstrings limited by 25-50% with pain    POSTURE: rounded shoulders, forward head, and flexed trunk    PALPATION: Diffuse palpable tenderness over bil lumbar paraspinals and gluteals    LUMBAR ROM:    Not tested due to balance deficits.    LOWER EXTREMITY ROM:      Limited by 25-50% bilaterally with pain    LOWER EXTREMITY MMT:     Bil hips 4-/5 with pain, knees 4/5 with pain   LUMBAR SPECIAL TESTS:  Straight leg raise test: Positive and Slump test: Positive bil- due to pain with all movement    FUNCTIONAL TESTS:  08/27/21:  5x sit to stand: 21 seconds with use of hands  10/18/2021:  5 times sit to/from stand:  19.2 sec without use of UE 11/13/2021:  5 times sit to/from stand:  9.4 sec without use of UE   GAIT: Distance walked: 100 Assistive device utilized: Single point cane Level of assistance: CGA (secondary to vision) Comments: flexed trunk and antalgic gait       TODAY'S TREATMENT:  9/21: Nustep level 3 (green machine) x10 minutes with PT present to discuss status Supine piriformis stretch with ball 5x right/left Supine with green ball lumbar rotation 15x Supine with green ball bridge 10x Supine with  hand to knee push isometric 5 sec holds 5x right/left  Supine red band clams 10x Sit to/from stand 5# kettlebell  x5 reps Seated pushing purple ball against thighs for TA contraction x10 Seated hip to hip with 5# kettlebell x5, Seated hip to shoulder x5 bilat, golf swings 5x , ear to ear 5x Manual therapy: soft tissue mobilization to bil lumbar paraspinals Trigger Point Dry-Needling  Treatment instructions: Expect mild to moderate muscle soreness. S/S of pneumothorax if dry needled over a lung field, and to seek immediate medical attention should they occur. Patient verbalized understanding of these instructions and education. Patient Consent Given: Yes Education handout provided: Previously provided Muscles treated: Bil lumbar multifidi prone Treatment response/outcome: Utilized skilled palpation to identify trigger points.  During dry needling able to palpate muscle twitch and muscle elongation  Skilled palpation and monitoring by PT during dry needling  11/13/2021: Nustep level 5 (green machine) x8 minutes with PT present to discuss status Seated with 2# ankle weights:  heel/toe raises, marching, LAQ, hip abduction scissors.  BLE 2x10 reps Piriformis stretch in sitting 2x20 sec bilat Seated hamstring stretch 2x20 sec bilat Sit to/from stand x5 reps Seated pushing purple ball against thighs for TA contraction 2x10 Seated hip to hip with 5# kettlebell 3x10, Seated hip to shoulder x10 bilat       11/20/21:  Pt seen for aquatic therapy today.  Treatment took place in water 3.25-4.5 ft in depth at the [place]. Temp of water was 93.  Pt entered/exited the pool via stairs with step-to pattern with supervision and bilat rail.   * Holding wall:  heel/toe raises x 20;  squats x 15;  hip ext x 10; hip abdct x 10; hip openers (hip abdct/knee flexion) * walking forward, side stepping, and high knee marching - 3 laps of each, UE on white floatation barbell   * rest  holding wall in squat position x 1 min  * with back near wall holding rainbow hand buoys: bilat row motion, horizontal abdct/ add, abdct/add x 10 x 2  * rest holding wall in squat position x 1 min * sitting on bench in water with feet on blue: STS holding white barbell x 10, decreasing CGA from therapist (steadying barbell) to SBA at pool edge   Pt requires buoyancy for support and to offload joints with strengthening/balance exercises. Viscosity of the water is needed for resistance of strengthening; water current perturbations provides challenge to standing balance unsupported, requiring increased core activation.   11/15/21  Pt seen for aquatic therapy today.  Treatment took place in water 3.25-4.5 ft in depth at the [place]. Temp of water was 91.  Pt entered/exited the pool via stairs with step-to pattern with supervision and bilat rail.   * walking forward / backward, side stepping, and high knee marching - 3 laps of each, UE on white floatation barbell  * Holding wall:  heel/toe raises x 20;  squats x 15 hip ext x 10; hip abdct x 10;  * sitting on bench in water:  TUG like exercise working on upright gait with increased speed, with rainbow hand buoys x 1 rep  * STS from 4th step with CGA assist x 5 reps * Seated on 4th step:  hip abdct/add; cycling LEs * in 51f water:  holding rainbow hand buoys under water at side, forward walking (challenge); hand buoys at surface - 1 width of light jog, 2 laps of walking forward/ backward;  shoulder abdct/add with rainbow hand buoys ( challenge);   Wide stance without UE support (very challenging), with back near wall - then with added arm abdct/ add without resistance with CGA/SBA   11/13/2021: Nustep level 5 (green machine) x8 minutes with PT present to discuss status Seated with 2# ankle weights:  heel/toe raises, marching, LAQ, hip abduction scissors.  BLE 2x10 reps Piriformis stretch in sitting 2x20 sec bilat Seated hamstring  stretch 2x20 sec bilat Sit to/from stand x5 reps Seated pushing purple ball against thighs for TA contraction 2x10 Seated hip to hip with 5# kettlebell 3x10, Seated hip to shoulder x10 bilat   11/07/2021: Nustep level 5 (green machine) x8 minutes with PT present to discuss status Seated with 2# ankle weights:  heel/toe raises, marching, LAQ, hip abduction scissors.  BLE 2x10 reps seated hamstring stretch  2x20 sec  Lt piriformis stretch in sitting  2x30 seconds Trigger Point Dry-Needling  Treatment instructions: Expect mild to moderate muscle soreness. S/S of pneumothorax if dry needled over a lung field, and to seek immediate medical attention should they occur. Patient verbalized understanding of these instructions and education. Patient Consent Given: Yes Education handout provided: Previously provided Muscles treated: Bil lumbar paraspinals and Rt gluteals Treatment response/outcome: Utilized skilled palpation to identify trigger points.  During dry needling able to palpate muscle twitch and muscle elongation  Skilled palpation and monitoring by PT during dry needling     PATIENT EDUCATION:  Education details: aquatics progressions Person educated: Patient Education method: Consulting civil engineer, Demonstration Education comprehension: verbalized understanding and returned demonstration     HOME EXERCISE PROGRAM: Access Code: [email or web address] URL: [email or web address] Date: 09/19/2021 Prepared by: Claiborne Billings  Exercises - Supine Lower Trunk Rotation  - 3 x daily - 7 x weekly - 1 sets - 3 reps - 20 hold - Hooklying Single Knee to Chest  - 3 x daily - 7 x weekly - 1 sets - 3 reps - 20 hold - Seated Hamstring Stretch  - 3 x daily - 7 x weekly - 1 sets - 3 reps - 20 hold - Sit to Stand Without Arm Support  - 2 x daily - 7 x weekly - 2 sets - 5 reps - Clamshell  - 2 x daily - 7 x weekly - 2 sets - 5 reps   ASSESSMENT:   CLINICAL IMPRESSION: Therapist modified treatment to limit sitting  secondary to tailbone pain.  Patient reports, "That was a workout" but not especially painful.  Verbal cues to count aloud to avoid holding breath with core exs.  Good response to manual therapy and DN with improved lumbar fascial mobility following.   Therapist monitoring response to all interventions and modifying treatment accordingly.       OBJECTIVE IMPAIRMENTS Abnormal gait, decreased activity tolerance, decreased strength, increased muscle spasms, impaired flexibility, postural dysfunction, and pain.    ACTIVITY LIMITATIONS carrying, lifting, standing, squatting, and stairs   PARTICIPATION LIMITATIONS: meal prep, cleaning, laundry, and community activity   PERSONAL FACTORS Age and 1-2 comorbidities: chronic LBP, lumbar fusion, legally blind  are also affecting patient's functional outcome.    REHAB POTENTIAL: Good   CLINICAL DECISION MAKING: Evolving/moderate complexity   EVALUATION COMPLEXITY: Moderate     GOALS: Goals reviewed with patient? Yes   SHORT TERM GOALS: Target date: 09/24/2021   Be independent in initial HEP Baseline: Goal status: MET   2.  Report > or = to 25% reduction in LBP with standing and walking Baseline: 25% reported (09/19/21) Goal status: MET   3.  Verbalize and demonstrate postural corrections to improve standing posture for ADLs and gait  Baseline: flexed trunk in standing  Goal status: Goal Met       LONG TERM GOALS: Target date: 12/14/2021   Be independent in advanced HEP Baseline:  Goal status: Ongoing   2.  Increase FOTO to > or = to 44 Baseline: 37 Goal status: Goal Met 11/13/2021   3.  Report > or = to 50% reduction in LBP with standing and walking  Baseline: 6-7/10 Goal status: INITIAL   4.  Demonstrate erect standing posture due to improved endurance and strength > or = to 50% of the time Baseline:  Goal status: INITIAL   5.  Perform 5x sit to stand in < or = to 13 seconds to improve balance and mobility  Baseline: 21  seconds  Goal status: Goal Met 11/13/2021  PLAN: PT FREQUENCY: 1-2x/week   PT DURATION: 8 weeks   PLANNED INTERVENTIONS: Therapeutic exercises, Therapeutic activity, Neuromuscular re-education, Balance training, Gait training, Patient/Family education, Joint mobilization, Stair training, Aquatic Therapy, Dry Needling, Electrical stimulation, Spinal manipulation, Spinal mobilization, Cryotherapy, Moist heat, Taping, and Manual therapy.   PLAN FOR NEXT SESSION:  Aquatic PT, update HEP as indicated, Continue functional and core strength, flexibility and DN/manual therapy as needed.    Ruben Im, PT 11/22/21 12:27 PM Phone: [phone] Fax: [phone]

## 2021-11-27 ENCOUNTER — Encounter (HOSPITAL_BASED_OUTPATIENT_CLINIC_OR_DEPARTMENT_OTHER): Payer: Self-pay | Admitting: Physical Therapy

## 2021-11-27 ENCOUNTER — Ambulatory Visit (HOSPITAL_BASED_OUTPATIENT_CLINIC_OR_DEPARTMENT_OTHER): Payer: Medicare HMO | Admitting: Physical Therapy

## 2021-11-27 DIAGNOSIS — M6281 Muscle weakness (generalized): Secondary | ICD-10-CM

## 2021-11-27 DIAGNOSIS — R2689 Other abnormalities of gait and mobility: Secondary | ICD-10-CM

## 2021-11-27 DIAGNOSIS — M5459 Other low back pain: Secondary | ICD-10-CM | POA: Diagnosis not present

## 2021-11-27 NOTE — Therapy (Signed)
OUTPATIENT PHYSICAL THERAPY TREATMENT NOTE   Patient Name: Emily Peck MRN: [id number] DOB:1955-08-10, 66 y.o., female Today's Date: 11/27/2021  PCP: Darrol Jump, NP REFERRING PROVIDER: Alysia Penna,  MD     END OF SESSION:   PT End of Session - 11/27/21 0911     Visit Number 17    Date for PT Re-Evaluation 12/14/21    Authorization Type [employer]    Authorization Time Period 16 visits 8/21-10/13/23    Authorization - Visit Number 8    Authorization - Number of Visits 16    Progress Note Due on Visit 23    PT Start Time 0903    PT Stop Time 0941    PT Time Calculation (min) 38 min    Activity Tolerance Patient tolerated treatment well    Behavior During Therapy WFL for tasks assessed/performed                 Past Medical History:  Diagnosis Date   Anxiety    Clotting disorder ([place])    Depression    Dyspnea    GERD (gastroesophageal reflux disease)    Headache    Lumbosacral spondylosis without myelopathy    Neuropathy    Postlaminectomy syndrome, lumbar region    Postlaminectomy syndrome, thoracic region    Stroke [facility])    Past Surgical History:  Procedure Laterality Date   ABDOMINAL HYSTERECTOMY     BACK SURGERY     BIOPSY  10/02/2020   Procedure: BIOPSY;  Surgeon: Juanita Craver, MD;  Location: [place];  Service: Endoscopy;;   COLONOSCOPY WITH PROPOFOL N/A 10/02/2020   Procedure: COLONOSCOPY WITH PROPOFOL;  Surgeon: Juanita Craver, MD;  Location: [facility] ENDOSCOPY;  Service: Endoscopy;  Laterality: N/A;   ESOPHAGOGASTRODUODENOSCOPY (EGD) WITH PROPOFOL N/A 10/02/2020   Procedure: ESOPHAGOGASTRODUODENOSCOPY (EGD) WITH PROPOFOL;  Surgeon: Juanita Craver, MD;  Location: [facility] ENDOSCOPY;  Service: Endoscopy;  Laterality: N/A;   EYE SURGERY     eye removed right    GIVENS CAPSULE STUDY N/A 10/02/2020   Procedure: GIVENS CAPSULE STUDY;  Surgeon: Juanita Craver, MD;  Location: [facility] ENDOSCOPY;  Service: Endoscopy;  Laterality: N/A;   KNEE ARTHROSCOPY     TOTAL KNEE  ARTHROPLASTY Right 01/09/2016   Procedure: TOTAL KNEE ARTHROPLASTY;  Surgeon: Melrose Nakayama, MD;  Location: [place];  Service: Orthopedics;  Laterality: Right;   Patient Active Problem List   Diagnosis Date Noted   Iron deficiency anemia 10/23/2020   Symptomatic anemia 09/30/2020   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   Category 4 blindness of left eye 04/19/2019   Nocturia more than twice per night 04/19/2019   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   Nasal septal deviation 04/19/2019   Excessive daytime sleepiness 04/19/2019   Neuropathy 04/19/2019   Sleep-related headache 04/19/2019   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[place]) 04/19/2019   Rotator cuff syndrome of both shoulders 06/09/2018   Cervicalgia 03/19/2018   Disorder of rotator cuff syndrome of right shoulder and allied disorder 01/09/2018   Primary osteoarthritis of right knee 01/09/2016   Peripheral polyneuropathy 10/19/2015   Knee pain, chronic 06/11/2013   Thoracic postlaminectomy syndrome 06/11/2013   Pes planus of both feet 06/11/2013   Trochanteric bursitis of both hips 11/06/2012   Lumbosacral spondylosis without myelopathy 05/09/2011   DEPRESSION 07/24/2006   CARPAL TUNNEL SYNDROME, RIGHT 07/24/2006   MYOPIA, 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   DEGENERATIVE JOINT DISEASE 07/24/2006   DISORDER, LUMBAR DISC W/MYELOPATHY 07/24/2006   Cerebral venous sinus thrombosis 07/24/2006   AVULSION, EYE  12/23/2005    REFERRING DIAG: W09.811 (ICD-10-CM) - Lumbosacral spondylosis without myelopathy  THERAPY DIAG:  Other low back pain  Other abnormalities of gait and mobility  Muscle weakness (generalized)  Rationale for Evaluation and Treatment Rehabilitation  PERTINENT HISTORY: 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  Requires SBA for mobility  SUBJECTIVE:   Pt reports that the weather has been  making her neuropathy worse.     PAIN:  Are you having pain? yes Pain scale:  4/10 Location: Lower back  Description:  achy Aggravating factor: standing too long, riding on SCAT Alleviating factor: laying in bed   Bilat knees are painful only when WB - 6/10; 0/10 at rest  PATIENT GOALS:  reduce LBP  OBJECTIVE: (objective measures completed at initial evaluation unless otherwise dated)   DIAGNOSTIC FINDINGS:  MRI 07/02/21: Progressive disc degeneration at L2-3 with mild spinal and left neural foraminal stenosis. 2. Severe L4-5 facet arthrosis with new trace anterolisthesis. No stenosis.   PATIENT SURVEYS:  08/27/21:  FOTO 37 (goal is 44) 10/18/2021:  FOTO 40% 11/13/2021:  FOTO 57%     MUSCLE LENGTH: Hamstrings limited by 25-50% with pain    POSTURE: rounded shoulders, forward head, and flexed trunk    PALPATION: Diffuse palpable tenderness over bil lumbar paraspinals and gluteals    LUMBAR ROM:    Not tested due to balance deficits.    LOWER EXTREMITY ROM:      Limited by 25-50% bilaterally with pain    LOWER EXTREMITY MMT:     Bil hips 4-/5 with pain, knees 4/5 with pain   LUMBAR SPECIAL TESTS:  Straight leg raise test: Positive and Slump test: Positive bil- due to pain with all movement    FUNCTIONAL TESTS:  08/27/21:  5x sit to stand: 21 seconds with use of hands  10/18/2021:  5 times sit to/from stand:  19.2 sec without use of UE 11/13/2021:  5 times sit to/from stand:  9.4 sec without use of UE   GAIT: Distance walked: 100 Assistive device utilized: Single point cane Level of assistance: CGA (secondary to vision) Comments: flexed trunk and antalgic gait       TODAY'S TREATMENT:  11/27/21:  Pt seen for aquatic therapy today.  Treatment took place in water 3.25-4.5 ft in depth at the [facility]. Temp of water was 92.  Pt entered/exited the pool via stairs with step-to pattern with supervision and bilat rail.  * holding white barbell:   forward walking, backward walking, side stepping, high knee marching, walking with row motion  * Holding wall:  heel/toe raises x 10;  squats x 15;  hip ext x 10; hip abdct x 10;  * sitting on bench in water with feet on blue: attempted blue short noodle pull down with ab set (unable to complete); STS holding white barbell x 5 x 2, decreasing CGA from therapist (steadying barbell) to SBA at pool edge  *  return to walking backward/ forward holding white barbell  *  at stairs: R/L hamstring stretch with foot on 2nd step ( standing upright)   Pt requires buoyancy for support and to offload joints with strengthening/balance exercises. Viscosity of the water is needed for resistance of strengthening; water current perturbations provides challenge to standing balance unsupported, requiring increased core activation. 9/21: Nustep level 3 (green machine) x10 minutes with PT present to discuss status Supine piriformis  stretch with ball 5x right/left Supine with green ball lumbar rotation 15x Supine with green ball bridge 10x Supine with hand to knee push isometric 5 sec holds 5x right/left  Supine red band clams 10x Sit to/from stand 5# kettlebell  x5 reps Seated pushing purple ball against thighs for TA contraction x10 Seated hip to hip with 5# kettlebell x5, Seated hip to shoulder x5 bilat, golf swings 5x , ear to ear 5x Manual therapy: soft tissue mobilization to bil lumbar paraspinals Trigger Point Dry-Needling  Treatment instructions: Expect mild to moderate muscle soreness. S/S of pneumothorax if dry needled over a lung field, and to seek immediate medical attention should they occur. Patient verbalized understanding of these instructions and education. Patient Consent Given: Yes Education handout provided: Previously provided Muscles treated: Bil lumbar multifidi prone Treatment response/outcome: Utilized skilled palpation to identify trigger points.  During dry needling able to palpate muscle  twitch and muscle elongation  Skilled palpation and monitoring by PT during dry needling   11/20/21:  Pt seen for aquatic therapy today.  Treatment took place in water 3.25-4.5 ft in depth at the [facility]. Temp of water was 93.  Pt entered/exited the pool via stairs with step-to pattern with supervision and bilat rail.   * Holding wall:  heel/toe raises x 20;  squats x 15;  hip ext x 10; hip abdct x 10; hip openers (hip abdct/knee flexion) * walking forward, side stepping, and high knee marching - 3 laps of each, UE on white floatation barbell   * rest holding wall in squat position x 1 min  * with back near wall holding rainbow hand buoys: bilat row motion, horizontal abdct/ add, abdct/add x 10 x 2  * rest holding wall in squat position x 1 min * sitting on bench in water with feet on blue: STS holding white barbell x 10, decreasing CGA from therapist (steadying barbell) to SBA at pool edge   Pt requires buoyancy for support and to offload joints with strengthening/balance exercises. Viscosity of the water is needed for resistance of strengthening; water current perturbations provides challenge to standing balance unsupported, requiring increased core activation.   11/15/21  Pt seen for aquatic therapy today.  Treatment took place in water 3.25-4.5 ft in depth at the [facility]. Temp of water was 91.  Pt entered/exited the pool via stairs with step-to pattern with supervision and bilat rail.   * walking forward / backward, side stepping, and high knee marching - 3 laps of each, UE on white floatation barbell  * Holding wall:  heel/toe raises x 20;  squats x 15 hip ext x 10; hip abdct x 10;  * sitting on bench in water:  TUG like exercise working on upright gait with increased speed, with rainbow hand buoys x 1 rep  * STS from 4th step with CGA assist x 5 reps * Seated on 4th step:  hip abdct/add; cycling LEs * in 59f water:  holding rainbow hand buoys  under water at side, forward walking (challenge); hand buoys at surface - 1 width of light jog, 2 laps of walking forward/ backward;  shoulder abdct/add with rainbow hand buoys ( challenge);   Wide stance without UE support (very challenging), with back near wall - then with added arm abdct/ add without resistance with CGA/SBA   11/13/2021: Nustep level 5 (green machine) x8 minutes with PT present to discuss status Seated with 2# ankle weights:  heel/toe raises, marching, LAQ, hip  abduction scissors.  BLE 2x10 reps Piriformis stretch in sitting 2x20 sec bilat Seated hamstring stretch 2x20 sec bilat Sit to/from stand x5 reps Seated pushing purple ball against thighs for TA contraction 2x10 Seated hip to hip with 5# kettlebell 3x10, Seated hip to shoulder x10 bilat   11/07/2021: Nustep level 5 (green machine) x8 minutes with PT present to discuss status Seated with 2# ankle weights:  heel/toe raises, marching, LAQ, hip abduction scissors.  BLE 2x10 reps seated hamstring stretch  2x20 sec  Lt piriformis stretch in sitting 2x30 seconds Trigger Point Dry-Needling  Treatment instructions: Expect mild to moderate muscle soreness. S/S of pneumothorax if dry needled over a lung field, and to seek immediate medical attention should they occur. Patient verbalized understanding of these instructions and education. Patient Consent Given: Yes Education handout provided: Previously provided Muscles treated: Bil lumbar paraspinals and Rt gluteals Treatment response/outcome: Utilized skilled palpation to identify trigger points.  During dry needling able to palpate muscle twitch and muscle elongation  Skilled palpation and monitoring by PT during dry needling     PATIENT EDUCATION:  Education details: aquatics progressions Person educated: Patient Education method: Consulting civil engineer, Demonstration Education comprehension: verbalized understanding and returned demonstration     HOME EXERCISE PROGRAM: Access  Code: [email or web address] URL: [email or web address] Date: 09/19/2021 Prepared by: Claiborne Billings  Exercises - Supine Lower Trunk Rotation  - 3 x daily - 7 x weekly - 1 sets - 3 reps - 20 hold - Hooklying Single Knee to Chest  - 3 x daily - 7 x weekly - 1 sets - 3 reps - 20 hold - Seated Hamstring Stretch  - 3 x daily - 7 x weekly - 1 sets - 3 reps - 20 hold - Sit to Stand Without Arm Support  - 2 x daily - 7 x weekly - 2 sets - 5 reps - Clamshell  - 2 x daily - 7 x weekly - 2 sets - 5 reps   ASSESSMENT:   CLINICAL IMPRESSION: Worked on standing/ walking tolerance with increased distance in water holding white barbell.   Pt reported elimination of back pain once submerged and moving in water that was chest deep.   No LOB in water.   Bilat hamstrings are tight; would benefit from hamstring stretch in HEP.  Progressing gradually towards remaining goals.     OBJECTIVE IMPAIRMENTS Abnormal gait, decreased activity tolerance, decreased strength, increased muscle spasms, impaired flexibility, postural dysfunction, and pain.    ACTIVITY LIMITATIONS carrying, lifting, standing, squatting, and stairs   PARTICIPATION LIMITATIONS: meal prep, cleaning, laundry, and community activity   PERSONAL FACTORS Age and 1-2 comorbidities: chronic LBP, lumbar fusion, legally blind  are also affecting patient's functional outcome.    REHAB POTENTIAL: Good   CLINICAL DECISION MAKING: Evolving/moderate complexity   EVALUATION COMPLEXITY: Moderate     GOALS: Goals reviewed with patient? Yes   SHORT TERM GOALS: Target date: 09/24/2021   Be independent in initial HEP Baseline: Goal status: MET   2.  Report > or = to 25% reduction in LBP with standing and walking Baseline: 25% reported (09/19/21) Goal status: MET   3.  Verbalize and demonstrate postural corrections to improve standing posture for ADLs and gait  Baseline: flexed trunk in standing  Goal status: Goal Met       LONG TERM GOALS: Target  date: 12/14/2021   Be independent in advanced HEP Baseline:  Goal status: Ongoing   2.  Increase FOTO to > or = to 44 Baseline:  37 Goal status: Goal Met 11/13/2021   3.  Report > or = to 50% reduction in LBP with standing and walking  Baseline: 6-7/10 Goal status: INITIAL   4.  Demonstrate erect standing posture due to improved endurance and strength > or = to 50% of the time Baseline:  Goal status: INITIAL   5.  Perform 5x sit to stand in < or = to 13 seconds to improve balance and mobility  Baseline: 21 seconds  Goal status: Goal Met 11/13/2021         PLAN: PT FREQUENCY: 1-2x/week   PT DURATION: 8 weeks   PLANNED INTERVENTIONS: Therapeutic exercises, Therapeutic activity, Neuromuscular re-education, Balance training, Gait training, Patient/Family education, Joint mobilization, Stair training, Aquatic Therapy, Dry Needling, Electrical stimulation, Spinal manipulation, Spinal mobilization, Cryotherapy, Moist heat, Taping, and Manual therapy.   PLAN FOR NEXT SESSION:  Aquatic PT, update HEP as indicated, Continue functional and core strength, flexibility and DN/manual therapy as needed.    Kerin Perna, PTA 11/27/21 10:04 AM [facility] [place], [place], [address] Phone: [phone]   Fax:  [phone]

## 2021-11-29 ENCOUNTER — Ambulatory Visit: Payer: Medicare HMO | Admitting: Physical Medicine & Rehabilitation

## 2021-11-29 ENCOUNTER — Ambulatory Visit: Payer: Medicare HMO | Admitting: Rehabilitative and Restorative Service Providers"

## 2021-11-29 ENCOUNTER — Encounter: Payer: Self-pay | Admitting: Rehabilitative and Restorative Service Providers"

## 2021-11-29 DIAGNOSIS — M5459 Other low back pain: Secondary | ICD-10-CM | POA: Diagnosis not present

## 2021-11-29 DIAGNOSIS — R2689 Other abnormalities of gait and mobility: Secondary | ICD-10-CM

## 2021-11-29 DIAGNOSIS — M6281 Muscle weakness (generalized): Secondary | ICD-10-CM

## 2021-11-29 NOTE — Therapy (Signed)
OUTPATIENT PHYSICAL THERAPY TREATMENT NOTE   Patient Name: Emily Peck MRN: [id number] DOB:January 17, 1956, 66 y.o., female Today's Date: 11/29/2021  PCP: Darrol Jump, NP REFERRING PROVIDER: Alysia Penna,  MD     END OF SESSION:   PT End of Session - 11/29/21 1030     Visit Number 18    Date for PT Re-Evaluation 12/14/21    Authorization Type [employer]here    Authorization Time Period 16 visits 8/21-10/13/23    Authorization - Visit Number 9    Authorization - Number of Visits 16    Progress Note Due on Visit 23    PT Start Time 7829    PT Stop Time 1055    PT Time Calculation (min) 40 min    Activity Tolerance Patient tolerated treatment well    Behavior During Therapy WFL for tasks assessed/performed                 Past Medical History:  Diagnosis Date   Anxiety    Clotting disorder ([place])    Depression    Dyspnea    GERD (gastroesophageal reflux disease)    Headache    Lumbosacral spondylosis without myelopathy    Neuropathy    Postlaminectomy syndrome, lumbar region    Postlaminectomy syndrome, thoracic region    Stroke [facility])    Past Surgical History:  Procedure Laterality Date   ABDOMINAL HYSTERECTOMY     BACK SURGERY     BIOPSY  10/02/2020   Procedure: BIOPSY;  Surgeon: Juanita Craver, MD;  Location: [place];  Service: Endoscopy;;   COLONOSCOPY WITH PROPOFOL N/A 10/02/2020   Procedure: COLONOSCOPY WITH PROPOFOL;  Surgeon: Juanita Craver, MD;  Location: [facility] ENDOSCOPY;  Service: Endoscopy;  Laterality: N/A;   ESOPHAGOGASTRODUODENOSCOPY (EGD) WITH PROPOFOL N/A 10/02/2020   Procedure: ESOPHAGOGASTRODUODENOSCOPY (EGD) WITH PROPOFOL;  Surgeon: Juanita Craver, MD;  Location: [facility] ENDOSCOPY;  Service: Endoscopy;  Laterality: N/A;   EYE SURGERY     eye removed right    GIVENS CAPSULE STUDY N/A 10/02/2020   Procedure: GIVENS CAPSULE STUDY;  Surgeon: Juanita Craver, MD;  Location: [facility] ENDOSCOPY;  Service: Endoscopy;  Laterality: N/A;   KNEE ARTHROSCOPY     TOTAL KNEE  ARTHROPLASTY Right 01/09/2016   Procedure: TOTAL KNEE ARTHROPLASTY;  Surgeon: Melrose Nakayama, MD;  Location: [place];  Service: Orthopedics;  Laterality: Right;   Patient Active Problem List   Diagnosis Date Noted   Iron deficiency anemia 10/23/2020   Symptomatic anemia 09/30/2020   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   Category 4 blindness of left eye 04/19/2019   Nocturia more than twice per night 04/19/2019   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   Nasal septal deviation 04/19/2019   Excessive daytime sleepiness 04/19/2019   Neuropathy 04/19/2019   Sleep-related headache 04/19/2019   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[place]) 04/19/2019   Rotator cuff syndrome of both shoulders 06/09/2018   Cervicalgia 03/19/2018   Disorder of rotator cuff syndrome of right shoulder and allied disorder 01/09/2018   Primary osteoarthritis of right knee 01/09/2016   Peripheral polyneuropathy 10/19/2015   Knee pain, chronic 06/11/2013   Thoracic postlaminectomy syndrome 06/11/2013   Pes planus of both feet 06/11/2013   Trochanteric bursitis of both hips 11/06/2012   Lumbosacral spondylosis without myelopathy 05/09/2011   DEPRESSION 07/24/2006   CARPAL TUNNEL SYNDROME, RIGHT 07/24/2006   MYOPIA, 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   DEGENERATIVE JOINT DISEASE 07/24/2006   DISORDER, LUMBAR DISC W/MYELOPATHY 07/24/2006   Cerebral venous sinus thrombosis 07/24/2006   AVULSION, EYE  12/23/2005    REFERRING DIAG: Z16.967 (ICD-10-CM) - Lumbosacral spondylosis without myelopathy  THERAPY DIAG:  Other low back pain  Other abnormalities of gait and mobility  Muscle weakness (generalized)  Rationale for Evaluation and Treatment Rehabilitation  PERTINENT HISTORY: 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  Requires SBA for mobility  SUBJECTIVE:   Pt states that she is feeling okay and  that she is hoping to start water aerobics after discharge.    PAIN:  Are you having pain? yes Pain scale:  0/10 Location: Lower back  Description:  achy Aggravating factor: standing too long, riding on SCAT Alleviating factor: laying in bed   Bilat knees are painful only when WB - 6/10; 0/10 at rest  PATIENT GOALS:  reduce LBP  OBJECTIVE: (objective measures completed at initial evaluation unless otherwise dated)   DIAGNOSTIC FINDINGS:  MRI 07/02/21: Progressive disc degeneration at L2-3 with mild spinal and left neural foraminal stenosis. 2. Severe L4-5 facet arthrosis with new trace anterolisthesis. No stenosis.   PATIENT SURVEYS:  08/27/21:  FOTO 37 (goal is 44) 10/18/2021:  FOTO 40% 11/13/2021:  FOTO 57%     MUSCLE LENGTH: Hamstrings limited by 25-50% with pain    POSTURE: rounded shoulders, forward head, and flexed trunk    PALPATION: Diffuse palpable tenderness over bil lumbar paraspinals and gluteals    LUMBAR ROM:    Not tested due to balance deficits.    LOWER EXTREMITY ROM:      Limited by 25-50% bilaterally with pain    LOWER EXTREMITY MMT:     Bil hips 4-/5 with pain, knees 4/5 with pain   LUMBAR SPECIAL TESTS:  Straight leg raise test: Positive and Slump test: Positive bil- due to pain with all movement    FUNCTIONAL TESTS:  08/27/21:  5x sit to stand: 21 seconds with use of hands  10/18/2021:  5 times sit to/from stand:  19.2 sec without use of UE 11/13/2021:  5 times sit to/from stand:  9.4 sec without use of UE   GAIT: Distance walked: 100 Assistive device utilized: Single point cane Level of assistance: CGA (secondary to vision) Comments: flexed trunk and antalgic gait       TODAY'S TREATMENT:  11/29/2021: Nustep level 5 (green machine) x10 minutes with PT present to discuss status Sit to/from stand holding 5# kettlebell:  x10 with chest press, x10 with overhead press Seated piriformis stretch 2x20 sec bilat Seated hip to hip and hip to  shoulder holding 5# kettlebell.  2x10 each bilat Standing shoulder ER and horizontal abduction with green tband 2x10 each   11/27/21:  Pt seen for aquatic therapy today.  Treatment took place in water 3.25-4.5 ft in depth at the [facility]. Temp of water was 92.  Pt entered/exited the pool via stairs with step-to pattern with supervision and bilat rail.  * holding white barbell:  forward walking, backward walking, side stepping, high knee marching, walking with row motion  * Holding wall:  heel/toe raises x 10;  squats x 15;  hip ext x 10; hip abdct x 10;  * sitting on bench in water with feet on blue: attempted blue short noodle pull down with ab set (unable to complete); STS holding white barbell x 5 x 2, decreasing CGA from therapist (steadying barbell) to SBA at pool edge  *  return to walking backward/ forward holding white barbell  *  at stairs: R/L hamstring stretch with foot on 2nd step ( standing upright)   Pt requires buoyancy for support and to offload joints with strengthening/balance exercises. Viscosity of the water is needed for resistance of strengthening; water current perturbations provides challenge to standing balance unsupported, requiring increased core activation.  9/21: Nustep level 3 (green machine) x10 minutes with PT present to discuss status Supine piriformis stretch with ball 5x right/left Supine with green ball lumbar rotation 15x Supine with green ball bridge 10x Supine with hand to knee push isometric 5 sec holds 5x right/left  Supine red band clams 10x Sit to/from stand 5# kettlebell  x5 reps Seated pushing purple ball against thighs for TA contraction x10 Seated hip to hip with 5# kettlebell x5, Seated hip to shoulder x5 bilat, golf swings 5x , ear to ear 5x Manual therapy: soft tissue mobilization to bil lumbar paraspinals Trigger Point Dry-Needling  Treatment instructions: Expect mild to moderate muscle soreness. S/S of pneumothorax if  dry needled over a lung field, and to seek immediate medical attention should they occur. Patient verbalized understanding of these instructions and education. Patient Consent Given: Yes Education handout provided: Previously provided Muscles treated: Bil lumbar multifidi prone Treatment response/outcome: Utilized skilled palpation to identify trigger points.  During dry needling able to palpate muscle twitch and muscle elongation  Skilled palpation and monitoring by PT during dry needling      PATIENT EDUCATION:  Education details: aquatics progressions Person educated: Patient Education method: Consulting civil engineer, Demonstration Education comprehension: verbalized understanding and returned demonstration     HOME EXERCISE PROGRAM: Access Code: [email or web address] URL: [email or web address] Date: 09/19/2021 Prepared by: Claiborne Billings  Exercises - Supine Lower Trunk Rotation  - 3 x daily - 7 x weekly - 1 sets - 3 reps - 20 hold - Hooklying Single Knee to Chest  - 3 x daily - 7 x weekly - 1 sets - 3 reps - 20 hold - Seated Hamstring Stretch  - 3 x daily - 7 x weekly - 1 sets - 3 reps - 20 hold - Sit to Stand Without Arm Support  - 2 x daily - 7 x weekly - 2 sets - 5 reps - Clamshell  - 2 x daily - 7 x weekly - 2 sets - 5 reps   ASSESSMENT:   CLINICAL IMPRESSION: Gilbert continues to progress with goal related activities reporting that she has made at least 50-75% improvements with pain no greater than 3-4/10 with increased ambulation and standing.  Pt able to progress with strengthening exercises and core stability. Continues to require skilled PT to progress towards goal related activities.    OBJECTIVE IMPAIRMENTS Abnormal gait, decreased activity tolerance, decreased strength, increased muscle spasms, impaired flexibility, postural dysfunction, and pain.    ACTIVITY LIMITATIONS carrying, lifting, standing, squatting, and stairs   PARTICIPATION LIMITATIONS: meal prep, cleaning, laundry,  and community activity   PERSONAL FACTORS Age and 1-2 comorbidities: chronic LBP, lumbar fusion, legally blind  are also affecting patient's functional outcome.    REHAB POTENTIAL: Good   CLINICAL DECISION MAKING: Evolving/moderate complexity   EVALUATION COMPLEXITY: Moderate     GOALS: Goals reviewed with patient? Yes   SHORT TERM GOALS: Target date: 09/24/2021   Be independent in initial HEP Baseline: Goal status: MET   2.  Report > or = to 25% reduction in LBP with standing and walking Baseline: 25% reported (09/19/21) Goal status: MET   3.  Verbalize and demonstrate postural corrections to improve standing posture for ADLs and  gait  Baseline: flexed trunk in standing  Goal status: Goal Met       LONG TERM GOALS: Target date: 12/14/2021   Be independent in advanced HEP Baseline:  Goal status: Ongoing   2.  Increase FOTO to > or = to 44 Baseline: 37 Goal status: Goal Met 11/13/2021   3.  Report > or = to 50% reduction in LBP with standing and walking  Baseline: 6-7/10 Goal status: GOAL MET  on 11/29/21 (reports only goes up to around a 3-4/10 now)   4.  Demonstrate erect standing posture due to improved endurance and strength > or = to 50% of the time Baseline:  Goal status: INITIAL   5.  Perform 5x sit to stand in < or = to 13 seconds to improve balance and mobility  Baseline: 21 seconds  Goal status: Goal Met 11/13/2021         PLAN: PT FREQUENCY: 1-2x/week   PT DURATION: 8 weeks   PLANNED INTERVENTIONS: Therapeutic exercises, Therapeutic activity, Neuromuscular re-education, Balance training, Gait training, Patient/Family education, Joint mobilization, Stair training, Aquatic Therapy, Dry Needling, Electrical stimulation, Spinal manipulation, Spinal mobilization, Cryotherapy, Moist heat, Taping, and Manual therapy.   PLAN FOR NEXT SESSION:  Aquatic PT, update HEP as indicated, Continue functional and core strength, flexibility and DN/manual therapy as  needed.     Juel Burrow, PT 11/29/21 11:47 AM  [facility] [address], [place], [place] [address] Phone # [phone] Fax [phone]

## 2021-12-03 ENCOUNTER — Ambulatory Visit: Payer: Medicare HMO | Admitting: Physical Medicine & Rehabilitation

## 2021-12-04 ENCOUNTER — Ambulatory Visit (HOSPITAL_BASED_OUTPATIENT_CLINIC_OR_DEPARTMENT_OTHER): Payer: Medicare HMO | Admitting: Physical Therapy

## 2021-12-06 ENCOUNTER — Ambulatory Visit
Payer: Medicare HMO | Attending: Physical Medicine & Rehabilitation | Admitting: Rehabilitative and Restorative Service Providers"

## 2021-12-06 ENCOUNTER — Encounter: Payer: Self-pay | Admitting: Rehabilitative and Restorative Service Providers"

## 2021-12-06 DIAGNOSIS — M5459 Other low back pain: Secondary | ICD-10-CM | POA: Diagnosis present

## 2021-12-06 DIAGNOSIS — R2689 Other abnormalities of gait and mobility: Secondary | ICD-10-CM | POA: Diagnosis present

## 2021-12-06 DIAGNOSIS — M6281 Muscle weakness (generalized): Secondary | ICD-10-CM | POA: Insufficient documentation

## 2021-12-06 NOTE — Therapy (Signed)
OUTPATIENT PHYSICAL THERAPY TREATMENT NOTE   Patient Name: Emily Peck MRN: [id number] DOB:10/26/1955, 66 y.o., female Today's Date: 12/06/2021  PCP: Darrol Jump, NP REFERRING PROVIDER: Alysia Penna,  MD     END OF SESSION:   PT End of Session - 12/06/21 1023     Visit Number 19    Date for PT Re-Evaluation 12/14/21    Authorization Type [employer]    Authorization Time Period 16 visits 8/21-10/13/23    Authorization - Visit Number 10    Authorization - Number of Visits 16    Progress Note Due on Visit 23    PT Start Time 7169    PT Stop Time 1055    PT Time Calculation (min) 40 min    Activity Tolerance Patient tolerated treatment well    Behavior During Therapy WFL for tasks assessed/performed                 Past Medical History:  Diagnosis Date   Anxiety    Clotting disorder ([place])    Depression    Dyspnea    GERD (gastroesophageal reflux disease)    Headache    Lumbosacral spondylosis without myelopathy    Neuropathy    Postlaminectomy syndrome, lumbar region    Postlaminectomy syndrome, thoracic region    Stroke [facility])    Past Surgical History:  Procedure Laterality Date   ABDOMINAL HYSTERECTOMY     BACK SURGERY     BIOPSY  10/02/2020   Procedure: BIOPSY;  Surgeon: Juanita Craver, MD;  Location: [place];  Service: Endoscopy;;   COLONOSCOPY WITH PROPOFOL N/A 10/02/2020   Procedure: COLONOSCOPY WITH PROPOFOL;  Surgeon: Juanita Craver, MD;  Location: [facility] ENDOSCOPY;  Service: Endoscopy;  Laterality: N/A;   ESOPHAGOGASTRODUODENOSCOPY (EGD) WITH PROPOFOL N/A 10/02/2020   Procedure: ESOPHAGOGASTRODUODENOSCOPY (EGD) WITH PROPOFOL;  Surgeon: Juanita Craver, MD;  Location: [facility] ENDOSCOPY;  Service: Endoscopy;  Laterality: N/A;   EYE SURGERY     eye removed right    GIVENS CAPSULE STUDY N/A 10/02/2020   Procedure: GIVENS CAPSULE STUDY;  Surgeon: Juanita Craver, MD;  Location: [facility] ENDOSCOPY;  Service: Endoscopy;  Laterality: N/A;   KNEE ARTHROSCOPY     TOTAL  KNEE ARTHROPLASTY Right 01/09/2016   Procedure: TOTAL KNEE ARTHROPLASTY;  Surgeon: Melrose Nakayama, MD;  Location: [place];  Service: Orthopedics;  Laterality: Right;   Patient Active Problem List   Diagnosis Date Noted   Iron deficiency anemia 10/23/2020   Symptomatic anemia 09/30/2020   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   Category 4 blindness of left eye 04/19/2019   Nocturia more than twice per night 04/19/2019   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   Nasal septal deviation 04/19/2019   Excessive daytime sleepiness 04/19/2019   Neuropathy 04/19/2019   Sleep-related headache 04/19/2019   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[place]) 04/19/2019   Rotator cuff syndrome of both shoulders 06/09/2018   Cervicalgia 03/19/2018   Disorder of rotator cuff syndrome of right shoulder and allied disorder 01/09/2018   Primary osteoarthritis of right knee 01/09/2016   Peripheral polyneuropathy 10/19/2015   Knee pain, chronic 06/11/2013   Thoracic postlaminectomy syndrome 06/11/2013   Pes planus of both feet 06/11/2013   Trochanteric bursitis of both hips 11/06/2012   Lumbosacral spondylosis without myelopathy 05/09/2011   DEPRESSION 07/24/2006   CARPAL TUNNEL SYNDROME, RIGHT 07/24/2006   MYOPIA, 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   DEGENERATIVE JOINT DISEASE 07/24/2006   DISORDER, LUMBAR DISC W/MYELOPATHY 07/24/2006   Cerebral venous sinus thrombosis 07/24/2006   AVULSION, EYE  12/23/2005    REFERRING DIAG: L38.101 (ICD-10-CM) - Lumbosacral spondylosis without myelopathy  THERAPY DIAG:  Other low back pain  Other abnormalities of gait and mobility  Muscle weakness (generalized)  Rationale for Evaluation and Treatment Rehabilitation  PERTINENT HISTORY: 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  Requires SBA for mobility  SUBJECTIVE:   Pt states that she missed  aquatics PT earlier this week secondary to problems with transportation.   PAIN:  Are you having pain? yes Pain scale:  0-3/10 Location: General body aches Description:  achy Aggravating factor: standing too long, riding on SCAT Alleviating factor: laying in bed   Bilat knees are painful only when WB - 6/10; 0/10 at rest  PATIENT GOALS:  reduce LBP  OBJECTIVE: (objective measures completed at initial evaluation unless otherwise dated)   DIAGNOSTIC FINDINGS:  MRI 07/02/21: Progressive disc degeneration at L2-3 with mild spinal and left neural foraminal stenosis. 2. Severe L4-5 facet arthrosis with new trace anterolisthesis. No stenosis.   PATIENT SURVEYS:  08/27/21:  FOTO 37 (goal is 44) 10/18/2021:  FOTO 40% 11/13/2021:  FOTO 57%     MUSCLE LENGTH: Hamstrings limited by 25-50% with pain    POSTURE: rounded shoulders, forward head, and flexed trunk    PALPATION: Diffuse palpable tenderness over bil lumbar paraspinals and gluteals    LUMBAR ROM:    Not tested due to balance deficits.    LOWER EXTREMITY ROM:      Limited by 25-50% bilaterally with pain    LOWER EXTREMITY MMT:     Bil hips 4-/5 with pain, knees 4/5 with pain   LUMBAR SPECIAL TESTS:  Straight leg raise test: Positive and Slump test: Positive bil- due to pain with all movement    FUNCTIONAL TESTS:  08/27/21:  5x sit to stand: 21 seconds with use of hands  10/18/2021:  5 times sit to/from stand:  19.2 sec without use of UE 11/13/2021:  5 times sit to/from stand:  9.4 sec without use of UE   GAIT: Distance walked: 100 Assistive device utilized: Single point cane Level of assistance: CGA (secondary to vision) Comments: flexed trunk and antalgic gait       TODAY'S TREATMENT:  12/06/2021: Nustep level 5 (green machine) x10 minutes with PT present to discuss status Sit to/from stand holding 5# kettlebell:  x10 with chest press, x10 with overhead press Seated hip to hip and hip to shoulder holding 5#  kettlebell.  2x10 each bilat Seated piriformis stretch 2x20 sec bilat Standing shoulder ER and horizontal abduction with green tband 2x10 each Seated hamstring curl with green tband 2x10 bilat Standing rocker board x2 min   11/29/2021: Nustep level 5 (green machine) x10 minutes with PT present to discuss status Sit to/from stand holding 5# kettlebell:  x10 with chest press, x10 with overhead press Seated piriformis stretch 2x20 sec bilat Seated hip to hip and hip to shoulder holding 5# kettlebell.  2x10 each bilat Standing shoulder ER and horizontal abduction with green tband 2x10 each   11/27/21:  Pt seen for aquatic therapy today.  Treatment took place in water 3.25-4.5 ft in depth at the [facility]. Temp of water was 92.  Pt entered/exited the pool via stairs with step-to pattern with supervision and bilat rail.  * holding white barbell:  forward walking, backward walking, side stepping, high knee marching, walking with row motion  * Holding  wall:  heel/toe raises x 10;  squats x 15;  hip ext x 10; hip abdct x 10;  * sitting on bench in water with feet on blue: attempted blue short noodle pull down with ab set (unable to complete); STS holding white barbell x 5 x 2, decreasing CGA from therapist (steadying barbell) to SBA at pool edge  *  return to walking backward/ forward holding white barbell  *  at stairs: R/L hamstring stretch with foot on 2nd step ( standing upright)   Pt requires buoyancy for support and to offload joints with strengthening/balance exercises. Viscosity of the water is needed for resistance of strengthening; water current perturbations provides challenge to standing balance unsupported, requiring increased core activation.      PATIENT EDUCATION:  Education details: aquatics progressions Person educated: Patient Education method: Consulting civil engineer, Demonstration Education comprehension: verbalized understanding and returned demonstration      HOME EXERCISE PROGRAM: Access Code: [email or web address] URL: [email or web address] Date: 12/06/2021 Prepared by: Shelby Dubin Kyisha Fowle  Exercises - Supine Lower Trunk Rotation  - 3 x daily - 7 x weekly - 1 sets - 3 reps - 20 hold - Hooklying Single Knee to Chest  - 3 x daily - 7 x weekly - 1 sets - 3 reps - 20 hold - Seated Hamstring Stretch  - 3 x daily - 7 x weekly - 1 sets - 3 reps - 20 hold - Sit to Stand Without Arm Support  - 2 x daily - 7 x weekly - 2 sets - 5 reps - Clamshell  - 2 x daily - 7 x weekly - 2 sets - 5-10 reps - Shoulder External Rotation and Scapular Retraction with Resistance  - 1 x daily - 7 x weekly - 2 sets - 10 reps - Standing Shoulder Horizontal Abduction with Resistance  - 1 x daily - 7 x weekly - 2 sets - 10 reps   ASSESSMENT:   CLINICAL IMPRESSION: Akshara continues to progress with goal related activities and is on track for discharge from skilled PT next week, as scheduled.  Pt to have one more aquatic PT session next week.  Pt continues to progress with core stability and flexibility during session.  Pt provided with green theraband and updated HEP during session today.     OBJECTIVE IMPAIRMENTS Abnormal gait, decreased activity tolerance, decreased strength, increased muscle spasms, impaired flexibility, postural dysfunction, and pain.    ACTIVITY LIMITATIONS carrying, lifting, standing, squatting, and stairs   PARTICIPATION LIMITATIONS: meal prep, cleaning, laundry, and community activity   PERSONAL FACTORS Age and 1-2 comorbidities: chronic LBP, lumbar fusion, legally blind  are also affecting patient's functional outcome.    REHAB POTENTIAL: Good   CLINICAL DECISION MAKING: Evolving/moderate complexity   EVALUATION COMPLEXITY: Moderate     GOALS: Goals reviewed with patient? Yes   SHORT TERM GOALS: Target date: 09/24/2021   Be independent in initial HEP Baseline: Goal status: MET   2.  Report > or = to 25% reduction in LBP with standing and  walking Baseline: 25% reported (09/19/21) Goal status: MET   3.  Verbalize and demonstrate postural corrections to improve standing posture for ADLs and gait  Baseline: flexed trunk in standing  Goal status: Goal Met       LONG TERM GOALS: Target date: 12/14/2021   Be independent in advanced HEP Baseline:  Goal status: Ongoing   2.  Increase FOTO to > or = to 44 Baseline: 37 Goal status: Goal Met 11/13/2021   3.  Report > or = to 50% reduction in LBP with standing and walking  Baseline: 6-7/10 Goal status: GOAL MET  on 11/29/21 (reports only goes up to around a 3-4/10 now)   4.  Demonstrate erect standing posture due to improved endurance and strength > or = to 50% of the time Baseline:  Goal status: IN PROGRESS   5.  Perform 5x sit to stand in < or = to 13 seconds to improve balance and mobility  Baseline: 21 seconds  Goal status: Goal Met 11/13/2021         PLAN: PT FREQUENCY: 1-2x/week   PT DURATION: 8 weeks   PLANNED INTERVENTIONS: Therapeutic exercises, Therapeutic activity, Neuromuscular re-education, Balance training, Gait training, Patient/Family education, Joint mobilization, Stair training, Aquatic Therapy, Dry Needling, Electrical stimulation, Spinal manipulation, Spinal mobilization, Cryotherapy, Moist heat, Taping, and Manual therapy.   PLAN FOR NEXT SESSION:  Aquatic PT, update HEP as indicated, Continue functional and core strength, flexibility and DN/manual therapy as needed.     Juel Burrow, PT 12/06/21 11:01 AM  [facility] [address] [place], [place] [address] Phone # [phone] Fax [phone]

## 2021-12-11 ENCOUNTER — Encounter (HOSPITAL_BASED_OUTPATIENT_CLINIC_OR_DEPARTMENT_OTHER): Payer: Self-pay | Admitting: Physical Therapy

## 2021-12-11 ENCOUNTER — Ambulatory Visit (HOSPITAL_BASED_OUTPATIENT_CLINIC_OR_DEPARTMENT_OTHER): Payer: Medicare HMO | Attending: Physical Medicine & Rehabilitation | Admitting: Physical Therapy

## 2021-12-11 DIAGNOSIS — R2689 Other abnormalities of gait and mobility: Secondary | ICD-10-CM | POA: Diagnosis present

## 2021-12-11 DIAGNOSIS — M5459 Other low back pain: Secondary | ICD-10-CM | POA: Insufficient documentation

## 2021-12-11 DIAGNOSIS — M6281 Muscle weakness (generalized): Secondary | ICD-10-CM | POA: Diagnosis present

## 2021-12-11 NOTE — Therapy (Signed)
OUTPATIENT PHYSICAL THERAPY TREATMENT NOTE   Patient Name: Emily Peck MRN: [id number] DOB:[birth year], 66 y.o., female Today's Date: 12/11/2021  PCP: Darrol Jump, NP REFERRING PROVIDER: Alysia Penna,  MD     END OF SESSION:   PT End of Session - 12/11/21 0901     Visit Number 20    Date for PT Re-Evaluation 12/14/21    Authorization Type [employer]    Authorization Time Period 16 visits 8/21-10/13/23    PT Start Time 0900    PT Stop Time 0940    PT Time Calculation (min) 40 min    Behavior During Therapy WFL for tasks assessed/performed                 Past Medical History:  Diagnosis Date   Anxiety    Clotting disorder ([place])    Depression    Dyspnea    GERD (gastroesophageal reflux disease)    Headache    Lumbosacral spondylosis without myelopathy    Neuropathy    Postlaminectomy syndrome, lumbar region    Postlaminectomy syndrome, thoracic region    Stroke [facility])    Past Surgical History:  Procedure Laterality Date   ABDOMINAL HYSTERECTOMY     BACK SURGERY     BIOPSY  10/02/2020   Procedure: BIOPSY;  Surgeon: Juanita Craver, MD;  Location: [place];  Service: Endoscopy;;   COLONOSCOPY WITH PROPOFOL N/A 10/02/2020   Procedure: COLONOSCOPY WITH PROPOFOL;  Surgeon: Juanita Craver, MD;  Location: [facility] ENDOSCOPY;  Service: Endoscopy;  Laterality: N/A;   ESOPHAGOGASTRODUODENOSCOPY (EGD) WITH PROPOFOL N/A 10/02/2020   Procedure: ESOPHAGOGASTRODUODENOSCOPY (EGD) WITH PROPOFOL;  Surgeon: Juanita Craver, MD;  Location: [facility] ENDOSCOPY;  Service: Endoscopy;  Laterality: N/A;   EYE SURGERY     eye removed right    GIVENS CAPSULE STUDY N/A 10/02/2020   Procedure: GIVENS CAPSULE STUDY;  Surgeon: Juanita Craver, MD;  Location: [facility] ENDOSCOPY;  Service: Endoscopy;  Laterality: N/A;   KNEE ARTHROSCOPY     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   Procedure: TOTAL KNEE ARTHROPLASTY;  Surgeon: Melrose Nakayama, MD;  Location: [place];  Service: Orthopedics;  Laterality: Right;    Patient Active Problem List   Diagnosis Date Noted   Iron deficiency anemia 10/23/2020   Symptomatic anemia 09/30/2020   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   Category 4 blindness of left eye 04/19/2019   Nocturia more than twice per night 04/19/2019   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   Nasal septal deviation 04/19/2019   Excessive daytime sleepiness 04/19/2019   Neuropathy 04/19/2019   Sleep-related headache 04/19/2019   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[place]) 04/19/2019   Rotator cuff syndrome of both shoulders 06/09/2018   Cervicalgia 03/19/2018   Disorder of rotator cuff syndrome of right shoulder and allied disorder 01/09/2018   Primary osteoarthritis of right knee 01/09/2016   Peripheral polyneuropathy 10/19/2015   Knee pain, chronic 06/11/2013   Thoracic postlaminectomy syndrome 06/11/2013   Pes planus of both feet 06/11/2013   Trochanteric bursitis of both hips 11/06/2012   Lumbosacral spondylosis without myelopathy 05/09/2011   DEPRESSION 07/24/2006   CARPAL TUNNEL SYNDROME, RIGHT 07/24/2006   MYOPIA, 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   DEGENERATIVE JOINT DISEASE 07/24/2006   DISORDER, LUMBAR DISC W/MYELOPATHY 07/24/2006   Cerebral venous sinus thrombosis 07/24/2006   AVULSION, EYE 12/23/2005    REFERRING DIAG: M47.817 (ICD-10-CM) - Lumbosacral spondylosis without myelopathy  THERAPY DIAG:  Other low back pain  Other abnormalities of gait and mobility  Muscle weakness (generalized)  Rationale for  Evaluation and Treatment Rehabilitation  PERTINENT HISTORY: 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  Requires SBA for mobility  SUBJECTIVE:   Pt reports she had headache upon waking but took extra strength Excedrin.  "I feel good today".   Pt reports she has not checked out [facility] yet.     PAIN:  Are you having pain? no Pain scale:   0/10 Location:  Description:   Aggravating factor: standing too long, riding on SCAT Alleviating factor: laying in bed   Bilat knees are painful only when WB - 6/10; 0/10 at rest  PATIENT GOALS:  reduce LBP  OBJECTIVE: (objective measures completed at initial evaluation unless otherwise dated)   DIAGNOSTIC FINDINGS:  MRI 07/02/21: Progressive disc degeneration at L2-3 with mild spinal and left neural foraminal stenosis. 2. Severe L4-5 facet arthrosis with new trace anterolisthesis. No stenosis.   PATIENT SURVEYS:  08/27/21:  FOTO 37 (goal is 44) 10/18/2021:  FOTO 40% 11/13/2021:  FOTO 57%     MUSCLE LENGTH: Hamstrings limited by 25-50% with pain    POSTURE: rounded shoulders, forward head, and flexed trunk    PALPATION: Diffuse palpable tenderness over bil lumbar paraspinals and gluteals    LUMBAR ROM:    Not tested due to balance deficits.    LOWER EXTREMITY ROM:      Limited by 25-50% bilaterally with pain    LOWER EXTREMITY MMT:     Bil hips 4-/5 with pain, knees 4/5 with pain   LUMBAR SPECIAL TESTS:  Straight leg raise test: Positive and Slump test: Positive bil- due to pain with all movement    FUNCTIONAL TESTS:  08/27/21:  5x sit to stand: 21 seconds with use of hands  10/18/2021:  5 times sit to/from stand:  19.2 sec without use of UE 11/13/2021:  5 times sit to/from stand:  9.4 sec without use of UE   GAIT: Distance walked: 100 Assistive device utilized: Single point cane Level of assistance: CGA (secondary to vision) Comments: flexed trunk and antalgic gait       TODAY'S TREATMENT:  12/11/21:  Pt seen for aquatic therapy today.  Treatment took place in water 3.25-4 ft in depth at the [facility] pool. Temp of water was 92.  Pt entered/exited the pool via stairs with step-to pattern with supervision and bilat rail.  * Holding wall: hip abdct x 10,  hip ext x 10; High knee marching x 10; heel/toe raises x 10;  squats x 10 - 2 sets in  circuit * lightly holding wall:  SLS x 10s x 3; tandem stance x 10s x 3 each foot forward * holding white barbell:  forward walking, backward walking, side stepping, high knee marching  - 4 laps of pool each (36 ft laps)  Pt requires buoyancy for support and to offload joints with strengthening/balance exercises. Viscosity of the water is needed for resistance of strengthening; water current perturbations provides challenge to standing balance unsupported, requiring increased core activation.  12/06/2021: Nustep level 5 (green machine) x10 minutes with PT present to discuss status Sit to/from stand holding 5# kettlebell:  x10 with chest press, x10 with overhead press Seated hip to hip and hip to shoulder holding 5# kettlebell.  2x10 each bilat Seated piriformis stretch 2x20 sec bilat Standing shoulder ER and horizontal abduction with green tband 2x10 each Seated hamstring curl with green tband 2x10 bilat Standing rocker board  x2 min   11/29/2021: Nustep level 5 (green machine) x10 minutes with PT present to discuss status Sit to/from stand holding 5# kettlebell:  x10 with chest press, x10 with overhead press Seated piriformis stretch 2x20 sec bilat Seated hip to hip and hip to shoulder holding 5# kettlebell.  2x10 each bilat Standing shoulder ER and horizontal abduction with green tband 2x10 each    PATIENT EDUCATION:  Education details: aquatics progressions Person educated: Patient Education method: Consulting civil engineer, Demonstration Education comprehension: verbalized understanding and returned demonstration     HOME EXERCISE PROGRAM: Access Code: [email or web address] URL: [email or web address] Date: 12/11/2021 Prepared by: [place]  Exercises - Supine Lower Trunk Rotation  - 3 x daily - 7 x weekly - 1 sets - 3 reps - 20 hold - Hooklying Single Knee to Chest  - 3 x daily - 7 x weekly - 1 sets - 3 reps - 20 hold - Seated Hamstring Stretch  - 3 x  daily - 7 x weekly - 1 sets - 3 reps - 20 hold - Sit to Stand Without Arm Support  - 2 x daily - 7 x weekly - 2 sets - 5 reps - Clamshell  - 2 x daily - 7 x weekly - 2 sets - 5-10 reps - Shoulder External Rotation and Scapular Retraction with Resistance  - 1 x daily - 7 x weekly - 2 sets - 10 reps - Standing Shoulder Horizontal Abduction with Resistance  - 1 x daily - 7 x weekly - 2 sets - 10 reps Aquatic HEP (issued laminated copy on 10/10) - Forward Walking  - 1 x daily - 7 x weekly - 3 sets - Side Stepping with Hand Floats  - 1 x daily - 7 x weekly - 3 sets - Standing March with Counter Support  - 1 x daily - 7 x weekly - 2 sets - 10 reps - Standing Hip Abduction AROM  - 1 x daily - 7 x weekly - 2 sets - 10 reps - Standing Hip Extension with Chair  - 1 x daily - 7 x weekly - 2 sets - 10 reps - Standing Heel Raise with Chair Support  - 1 x daily - 7 x weekly - 2 sets - 10 reps - Mini Squat with Counter Support  - 1 x daily - 7 x weekly - 2 sets - 10 reps - Standing Single Leg Stance with Counter Support  - 1 x daily - 7 x weekly - 1 sets - 2-3 reps - 10 seconds hold - Tandem Stance with Support  - 1 x daily - 7 x weekly - 1 sets - 2-3 reps - 10 seconds  hold - Standing Hamstring Stretch on Chair  - 1 x daily - 7 x weekly - 1 sets - 2 reps - 20 seconds hold   ASSESSMENT:   CLINICAL IMPRESSION: Emily Peck tolerated aquatic session well and is on track for discharge from skilled PT this week, as scheduled. Pt able to complete session taking direction from therapist on deck.  No increase in pain during session. Sidestepping the most challenging exercise due to visual impairment.  Laminated aquatic HEP issued today - reviewed with patient.  Encouraged pt to go to [facility] (as previously discussed) to continue exercising in pool.  Pt continues to progress with core stability and flexibility during session.  PT to assess for d/c next visit.     OBJECTIVE IMPAIRMENTS Abnormal gait, decreased activity  tolerance,  decreased strength, increased muscle spasms, impaired flexibility, postural dysfunction, and pain.    ACTIVITY LIMITATIONS carrying, lifting, standing, squatting, and stairs   PARTICIPATION LIMITATIONS: meal prep, cleaning, laundry, and community activity   PERSONAL FACTORS Age and 1-2 comorbidities: chronic LBP, lumbar fusion, legally blind  are also affecting patient's functional outcome.    REHAB POTENTIAL: Good   CLINICAL DECISION MAKING: Evolving/moderate complexity   EVALUATION COMPLEXITY: Moderate     GOALS: Goals reviewed with patient? Yes   SHORT TERM GOALS: Target date: 09/24/2021   Be independent in initial HEP Baseline: Goal status: MET   2.  Report > or = to 25% reduction in LBP with standing and walking Baseline: 25% reported (09/19/21) Goal status: MET   3.  Verbalize and demonstrate postural corrections to improve standing posture for ADLs and gait  Baseline: flexed trunk in standing  Goal status: Goal Met       LONG TERM GOALS: Target date: 12/14/2021   Be independent in advanced HEP Baseline:  Goal status: Ongoing   2.  Increase FOTO to > or = to 44 Baseline: 37 Goal status: Goal Met 11/13/2021   3.  Report > or = to 50% reduction in LBP with standing and walking  Baseline: 6-7/10 Goal status: GOAL MET  on 11/29/21 (reports only goes up to around a 3-4/10 now)   4.  Demonstrate erect standing posture due to improved endurance and strength > or = to 50% of the time Baseline:  Goal status: IN PROGRESS   5.  Perform 5x sit to stand in < or = to 13 seconds to improve balance and mobility  Baseline: 21 seconds  Goal status: Goal Met 11/13/2021         PLAN: PT FREQUENCY: 1-2x/week   PT DURATION: 8 weeks   PLANNED INTERVENTIONS: Therapeutic exercises, Therapeutic activity, Neuromuscular re-education, Balance training, Gait training, Patient/Family education, Joint mobilization, Stair training, Aquatic Therapy, Dry Needling, Electrical  stimulation, Spinal manipulation, Spinal mobilization, Cryotherapy, Moist heat, Taping, and Manual therapy.   PLAN FOR NEXT SESSION:  update HEP as indicated, Continue functional and core strength, flexibility and DN/manual therapy as needed.    Kerin Perna, PTA 12/11/21 9:29 AM [facility] [address] [place], [place], [address] Phone: [phone]   Fax:  [phone]

## 2021-12-12 ENCOUNTER — Telehealth: Payer: Self-pay | Admitting: Neurology

## 2021-12-12 ENCOUNTER — Ambulatory Visit (INDEPENDENT_AMBULATORY_CARE_PROVIDER_SITE_OTHER): Payer: Medicare HMO | Admitting: Neurology

## 2021-12-12 ENCOUNTER — Encounter: Payer: Self-pay | Admitting: Neurology

## 2021-12-12 VITALS — BP 104/61 | HR 56 | Ht 64.0 in | Wt 199.6 lb

## 2021-12-12 DIAGNOSIS — R270 Ataxia, unspecified: Secondary | ICD-10-CM | POA: Diagnosis not present

## 2021-12-12 DIAGNOSIS — M7918 Myalgia, other site: Secondary | ICD-10-CM

## 2021-12-12 DIAGNOSIS — M5412 Radiculopathy, cervical region: Secondary | ICD-10-CM

## 2021-12-12 DIAGNOSIS — M5000 Cervical disc disorder with myelopathy, unspecified cervical region: Secondary | ICD-10-CM

## 2021-12-12 DIAGNOSIS — R29898 Other symptoms and signs involving the musculoskeletal system: Secondary | ICD-10-CM

## 2021-12-12 DIAGNOSIS — W19XXXA Unspecified fall, initial encounter: Secondary | ICD-10-CM

## 2021-12-12 DIAGNOSIS — G8929 Other chronic pain: Secondary | ICD-10-CM

## 2021-12-12 DIAGNOSIS — M5481 Occipital neuralgia: Secondary | ICD-10-CM

## 2021-12-12 DIAGNOSIS — M542 Cervicalgia: Secondary | ICD-10-CM

## 2021-12-12 DIAGNOSIS — R292 Abnormal reflex: Secondary | ICD-10-CM

## 2021-12-12 NOTE — Telephone Encounter (Signed)
Emily Peck Emily Peck: [id number] exp. 12/12/21-01/11/22 medicaid NPR sent to GI [phone]

## 2021-12-12 NOTE — Progress Notes (Signed)
[facility]    Provider:  Dr Jaynee Eagles Requesting Provider: Tommi Emery, FNP Primary Care Provider:  Darrol Jump, NP  CC:  daily headaches, fatigue, snores, sleep study negative  HPI:  Emily Peck is a 66 y.o. female here as requested by Tommi Emery, FNP for headache. PMHx overweight, circadian rhythm sleep disorder, GBS in February 2021, chronic pain syndrome, degenerative myopia, deviated nasal septum, spondylosis without myelopathy or radiculopathy of the lumbosacral region, cervicalgia, lumbago with sciatica, left rotator cuff tear, anemia, vitamin D deficiency, postlaminectomy syndrome disorder, osteoarthritis and bursitis, depression, headache, fatigue long-term use of anticoagulant, GERD, sinusitis, history of cerebrovascular accident in 2022, dizziness, fibromyalgia, polyneuropathy, cerebral venous sinus thrombosis 2004, migraines.  I reviewed notes from Glory Buff NP who states patient has a headache syndrome, "other headache syndrome", on topiramate 200 mg orally at bedtime, complains of daily frontal headaches and sinus pressure, uses muscle relaxer.  She denied dizziness, but she did complain of a dull headache.  She uses Excedrin sometimes.   She is here alone, started over a year ago and had an unremarkable MRI at that time. No inciting events. She has aching and a pulsating/pounding/throbbing, it comes from her neck and she has a lof of neck pain and radiates up from the neck and uses ben gay on her neck. Sharp in the back of head and radiates to the top of the head. Not like her migraines. She has had a lot of imaging of her neck. Hurts to the touch and is painful (points to the emergence of the occipital nerve), turning her head makes it shoot straight up. Can be severe. Happens every day. Shoots briefly with pain in between, when she turns her neck it'll go sharp and sitting hurts too. She reports very poor balance, falls, chronic neck  pain > 1 year, failed conservative measures, had multiple CTs and Xrays of the neck throught the years with degenerative changes. No other focal neurologic deficits, associated symptoms, inciting events or modifiable factors.   Reviewed notes, labs and imaging from outside physicians, which showed:  From a thorough review of records, medications tried that can be used in headache management include: Gabapentin, hydrocodone, Lexapro, magnesium, trazodone, Flexeril, topiramate, Tylenol, Tylenol 3, aspirin, baclofen, Soma, Flexeril, Valium, Celexa, codeine, Benadryl, Lexapro, ibuprofen, ketorolac injections, lidocaine injections and patches, meclizine, Robaxin, Medrol Dosepak, Reglan orally and injection, Zofran oral and injection, prednisone tablets, tizanidine, topiramate, tramadol,  MRI brain 01/01/2021: EXAM: reviewed images agree MRI HEAD WITHOUT CONTRAST   TECHNIQUE: Multiplanar, multiecho pulse sequences of the brain and surrounding structures were obtained without intravenous contrast.   COMPARISON:  Head CT January 01, 2021.   FINDINGS: The study is partially degraded by motion.   Brain: No acute infarction, hemorrhage, hydrocephalus, extra-axial collection or mass lesion. Scattered and confluent foci of T2 hyperintensity are seen within the white matter of the cerebral hemispheres and within the pons, nonspecific, most likely related to chronic small vessel ischemia.   Vascular: Normal flow voids.   Skull and upper cervical spine: Normal marrow signal.   Sinuses/Orbits: Right eye enucleation with prostatic implant. Paranasal sinuses are clear.   Other: None.   IMPRESSION: 1. Motion degraded study. 2. No acute intracranial abnormality. 3. Moderate to advanced chronic microvascular ischemic changes the white matter.     Electronically Signed   By: Pedro Earls M.D.   On: 01/01/2021 14:42  MRI cervical spine 2002: FINDINGS (only have report, reviewed  report) CLINICAL  DATA:  HEADACHE.  STIFF NECK.  NAUSEA AND VOMITING, WITHOUT HISTORY OF TRAUMA.  THIS HAS  OCCURRED OVER THE PAST 6 DAYS.  MR BRAIN WITHOUT CONTRAST  COMPARISON EXAMINATION IS CT SCAN OF 03/31/02.  FINDINGS:  THE PATIENT BECAME TIRED DURING THE EXAM AND REFUSED CONTRAST AND FURTHER IMAGING.  ABNORMALITY IN THE RIGHT PARIETAL LOBE WITH LOSS OF SULCI AND GYRIFORM GRAY MATTER THICKENING.  THIS MAY BE RELATED TO INFARCTION (WHETHER VENOUS OR ARTERIAL IN ORIGIN).  OTHER CONSIDERATIONS  INCLUDE LOSS OF SULCI SECONDARY TO BLOOD OR PUS WITHIN THE SULCI WITH EARLY CEREBRITIS CAUSING GRAY  MATTER THICKENING.  CORRELATION WITH CEREBRAL SPINAL FLUID ANALYSIS RECOMMENDED.  CHANGES OF  UNDERLYING VASCULITIS MAY CAUSE THIS APPEARANCE.  TUMOR FELT TO BE LESS LIKELY CONSIDERATION.  SUPERIOR SAGITTAL SINUS AND TRANSVERSE SINUSES APPEAR PATENT.  THIS DOES NOT EXCLUDE SUPERFICIAL  CORTICAL VEIN THROMBOSES AS CAUSE FOR THE RIGHT PARIETAL LOBE ABNORMALITY.  MR VENOGRAPHY/MR  ANGIOGRAPHY OR FORMAL CATHETER ANGIOGRAM CAN BE OBTAINED FOR FURTHER DELINEATION IF CLINICALLY  DESIRED. ARTERIAL DISSECTION CANNOT BE EXCLUDED.  SCATTERED NONSPECIFIC SUBCORTICAL AND PERIVENTRICULAR WHITE MATTER TYPE CHANGES AS WELL AS WHITE  MATTER TYPE CHANGES WITHIN THE UPPER CENTRAL PONS.  ETIOLOGY INDETERMINATE.  THESE CHANGES MAY BE  RELATED TO UNDERLYING:  SMALL VESSEL DISEASE, VASCULITIS, DEMYELINATING PROCESS, INFLAMMATORY  PROCESS, OR CHANGES AS HAVE BEEN DESCRIBED IN PATIENTS EXPERIENCING MIGRAINE HEADACHES.  ELONGATED GLOBES BILATERALLY.  CLEAR PARANASAL SINUSES.  IMPRESSION  1.  ABNORMALITY RIGHT PARIETAL LOBE.  QUESTION INFARCTION (VENOUS VERSUS ARTERIAL) VERSUS LOSS OF  SULCI SECONDARY TO FILLING OF SULCI WITH BLOOD OR PUS.  CLINICAL CORRELATION, CEREBROSPINAL FLUID  ANALYSIS CORRELATION, MRA/MRV, OR FORMAL CATHETER ANGIOGRAM MAY BE NECESSARY TO HELP FURTHER  SEPARATE THESE POSSIBILITIES.  2.  NONSPECIFIC WHITE  MATTER TYPE CHANGES WITH ABOVE DESCRIBED CONSIDERATIONS.  CERVICAL SPINE MR WITHOUT CONTRAST  COMPARISON SCAN CT 03/31/02.  FINDINGS:  ROTATION OF C1 TOWARDS THE RIGHT WITH WHAT APPEARS TO BE FUSION OF THE LEFT OCCIPITAL  CONDYLE WITH THE LEFT LATERAL MASS OF C1, BETTER DEMONSTRATED ON CT SCAN. PATIENT DENIES RECENT  TRAUMA TO SUGGEST THIS REPRESENTS POST TRAUMATIC TORTICOLLIS.   SLIGHTLY MOTION DEGRADED EXAM  WITHOUT OBVIOUS SIGNAL ABNORMALITY THROUGHOUT THE CERVICAL SPINAL CORD.  C2-3 AND C3-4:  NO EVIDENCE OF DISK HERNIATION.  C4-5:  MILD DISK DEGENERATION BULGE MOST NOTABLE CENTRALLY WITH MILD SPINAL STENOSIS.  C5-6:  MILD DISK DEGENERATION, MINIMAL BULGE.  C6-7:  MILD DISK DEGENERATION AND BULGE.  POSTERIOR CENTRAL ANNULAR SPUR.  SLIGHT IMPRESSION UPON  THE VENTRAL ASPECT OF THECAL SAC.  BILATERAL UNCOVERTEBRAL JOINT DEGENERATIVE CHANGES WITH MINIMAL  BILATERAL NEURAL FORAMINAL NARROWING.  C7-T1:  NEGATIVE.  INCIDENTALLY NOTED ARE TOP NORMAL TO SLIGHTLY ENLARGED BILATERAL CAROTID CHAIN LYMPH NODES,  ETIOLOGY INDETERMINATE.  IMPRESSION  1.  ROTATION OF C1 TOWARDS THE RIGHT WITH WHAT APPEARS TO BE CONGENITAL FUSION OF THE LEFT OCCIPUT  WITH THE LEFT LATERAL MASS OF C1.  2.  MILD DEGENERATIVE CHANGES THROUGHOUT THE CERVICAL SPINE WITHOUT LARGE DISK HERNIATION OR  SIGNIFICANT SPINAL STENOSIS AS DESCRIBED ABOVE.   MOST NOTABLE DEGENERATIVE CHANGE C4-5 AND C6-7.  3.  TOP NORMAL TO SLIGHTLY ENLARGED CAROTID CHAIN LYMPH NODES.  ETIOLOGY INDETERMINATE.  Bmp 2022 normal, also reviewed following:  Recent Results (from the past 2160 hour(s))  [facility]     Status: None   Collection Time: 10/23/21 10:11 AM  Result Value Ref Range   Summary Note     Comment: ==================================================================== [facility] ==================================================================== Test  Result       Flag        Units  Drug Present   Oxazepam                       119                     ng/mg creat   Temazepam                      210                     ng/mg creat    Oxazepam and temazepam are expected metabolites of diazepam.    Oxazepam is also an expected metabolite of other benzodiazepine    drugs, including chlordiazepoxide, prazepam, clorazepate, halazepam,    and temazepam.  Oxazepam and temazepam are available as scheduled    prescription medications.    Ethyl Glucuronide              999                     ng/mg creat   Ethyl Sulfate                  266                     ng/mg creat    EtG and EtS are metabolites of ethyl alcohol; EtG may be a    fermentation product of glucose, but EtS is not known to be formed    by fermentation.  Incide ntal exposure to alcohol may result in    detectable levels of EtG and/or EtS.  EtG/EtS results should be    interpreted in the context of all available clinical and behavioral    information.    Hydrocodone                    3153                    ng/mg creat   Hydromorphone                  123                     ng/mg creat   Dihydrocodeine                 55                      ng/mg creat   Norhydrocodone                 2026                    ng/mg creat    Sources of hydrocodone include scheduled prescription medications.    Hydromorphone, dihydrocodeine and norhydrocodone are expected    metabolites of hydrocodone. Hydromorphone and dihydrocodeine are    also available as scheduled prescription medications.    Cyclobenzaprine                PRESENT   Desmethylcyclobenzaprine       PRESENT    Desmethylcyclobenzaprine is an expected metabolite of    cyclobenzaprine.    Citalopram  PRESENT   Desmethylcitalopram            PRESENT    Des methylcitalopram is an expected metabolite of citalopram or the    enantiomeric form, escitalopram.    Trazodone                      PRESENT   1,3 chlorophenyl  piperazine    PRESENT    1,3-chlorophenyl piperazine is an expected metabolite of trazodone.  ==================================================================== Test                      Result    Flag   Units      Ref Range   Creatinine              103              mg/dL      >=20 ==================================================================== Declared Medications:  Medication list was not provided. ==================================================================== For clinical consultation, please call [phone]. ====================================================================      Review of Systems: Patient complains of symptoms per HPI as well as the following symptoms neck pain. Pertinent negatives and positives per HPI. All others negative.   Social History   Socioeconomic History   Marital status: Single    Spouse name: Not on file   Number of children: 2   Years of education: Not on file   Highest education level: Not on file  Occupational History   Not on file  Tobacco Use   Smoking status: Never   Smokeless tobacco: Never  Vaping Use   Vaping Use: Never used  Substance and Sexual Activity   Alcohol use: No   Drug use: No   Sexual activity: Not on file  Other Topics Concern   Not on file  Social History Narrative   Not on file   Social Determinants of Health   Financial Resource Strain: Not on file  Food Insecurity: Not on file  Transportation Needs: Not on file  Physical Activity: Not on file  Stress: Not on file  Social Connections: Not on file  Intimate Partner Violence: Not on file    Family History  Problem Relation Age of Onset   Cancer Mother    Diabetes Mother    Migraines Sister    Migraines Brother     Past Medical History:  Diagnosis Date   Anxiety    Clotting disorder ([place])    Depression    Dyspnea    GERD (gastroesophageal reflux disease)    Headache    Lumbosacral spondylosis without myelopathy     Neuropathy    Postlaminectomy syndrome, lumbar region    Postlaminectomy syndrome, thoracic region    Stroke [facility])     Patient Active Problem List   Diagnosis Date Noted   Iron deficiency anemia 10/23/2020   Symptomatic anemia 09/30/2020   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   Category 4 blindness of left eye 04/19/2019   Nocturia more than twice per night 04/19/2019   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   Nasal septal deviation 04/19/2019   Excessive daytime sleepiness 04/19/2019   Neuropathy 04/19/2019   Sleep-related headache 04/19/2019   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[place]) 04/19/2019   Rotator cuff syndrome of both shoulders 06/09/2018   Cervicalgia 03/19/2018   Disorder of rotator cuff syndrome of right shoulder and allied disorder 01/09/2018   Primary osteoarthritis of right knee 01/09/2016   Peripheral polyneuropathy 10/19/2015   Knee pain, chronic 06/11/2013  Thoracic postlaminectomy syndrome 06/11/2013   Pes planus of both feet 06/11/2013   Trochanteric bursitis of both hips 11/06/2012   Lumbosacral spondylosis without myelopathy 05/09/2011   DEPRESSION 07/24/2006   CARPAL TUNNEL SYNDROME, RIGHT 07/24/2006   MYOPIA, 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   DEGENERATIVE JOINT DISEASE 07/24/2006   DISORDER, LUMBAR DISC W/MYELOPATHY 07/24/2006   Cerebral venous sinus thrombosis 07/24/2006   AVULSION, EYE 12/23/2005    Past Surgical History:  Procedure Laterality Date   ABDOMINAL HYSTERECTOMY     BACK SURGERY     BIOPSY  10/02/2020   Procedure: BIOPSY;  Surgeon: Juanita Craver, MD;  Location: [facility];  Service: Endoscopy;;   COLONOSCOPY WITH PROPOFOL N/A 10/02/2020   Procedure: COLONOSCOPY WITH PROPOFOL;  Surgeon: Juanita Craver, MD;  Location: [facility] ENDOSCOPY;  Service: Endoscopy;  Laterality: N/A;   ESOPHAGOGASTRODUODENOSCOPY (EGD) WITH PROPOFOL N/A 10/02/2020   Procedure: ESOPHAGOGASTRODUODENOSCOPY (EGD) WITH PROPOFOL;  Surgeon: Juanita Craver, MD;   Location: [facility] ENDOSCOPY;  Service: Endoscopy;  Laterality: N/A;   EYE SURGERY     eye removed right    GIVENS CAPSULE STUDY N/A 10/02/2020   Procedure: GIVENS CAPSULE STUDY;  Surgeon: Juanita Craver, MD;  Location: [facility] ENDOSCOPY;  Service: Endoscopy;  Laterality: N/A;   KNEE ARTHROSCOPY     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   Procedure: TOTAL KNEE ARTHROPLASTY;  Surgeon: Melrose Nakayama, MD;  Location: [place] [facility];  Service: Orthopedics;  Laterality: Right;    Current Outpatient Medications  Medication Sig Dispense Refill   Ascorbic Acid (VITAMIN C PO) Take 1 tablet by mouth daily.     B Complex Vitamins (VITAMIN B COMPLEX PO) Take 1 tablet by mouth daily.     Camphor-Eucalyptus-Menthol (VICKS VAPORUB EX) Apply topically. Uses on feet prn for pain  (family dollar brand)     cyclobenzaprine (FLEXERIL) 5 MG tablet Take 5 mg by mouth at bedtime as needed.     diazepam (VALIUM) 10 MG tablet diazepam 10 mg tablet  TAKE 1 TABLET BY MOUTH 30 MINS BEFORE PROCEDURE     diphenhydrAMINE (BENADRYL) 25 MG tablet Take 25-50 mg by mouth 4 (four) times daily as needed for itching. Takes with Tylenol #4 due to codeine allergy.     escitalopram (LEXAPRO) 20 MG tablet Take 20 mg by mouth at bedtime.   0   FEROSUL 325 (65 Fe) MG tablet Take 325 mg by mouth daily.     fluticasone (FLONASE) 50 MCG/ACT nasal spray Place 1 spray into both nostrils in the morning and at bedtime.     gabapentin (NEURONTIN) 600 MG tablet Take 2 tablets (1,200 mg total) by mouth 3 (three) times daily. 540 tablet 3   HYDROcodone-acetaminophen (NORCO) 5-325 MG tablet Take 1 tablet by mouth 2 (two) times daily as needed for moderate pain. 60 tablet 0   ibuprofen (ADVIL) 800 MG tablet ibuprofen 800 mg tablet  TAKE 1 TABLET BY MOUTH TWICE DAILY     ketoconazole (NIZORAL) 2 % cream ketoconazole 2 % topical cream  APPLY TWICE DAILY     Lido-Capsaicin-Men-Methyl Sal 0.5-0.035-5-20 % PTCH Place one patch on the bottom of each foot every night at  bedtime 60 patch 11   methocarbamol (ROBAXIN) 500 MG tablet Take 1 tablet (500 mg total) by mouth every 8 (eight) hours as needed for muscle spasms. 45 tablet 0   neomycin-polymyxin-dexamethasone (MAXITROL) 0.1 % ophthalmic suspension neomycin-polymyxin-dexameth 3.5 mg/mL-10,000 unit/mL-0.1% eye drops  SHAKE LIQUID AND INSTILL 1 DROP IN RIGHT EYE TWICE DAILY  pantoprazole (PROTONIX) 40 MG tablet Take 1 tablet (40 mg total) by mouth 2 (two) times daily. 60 tablet 0   potassium chloride (KLOR-CON M) 10 MEQ tablet Take 10 mEq by mouth 1 day or 1 dose.     potassium chloride (KLOR-CON) 10 MEQ tablet Take 10 mEq by mouth daily.     Propylene Glycol 0.6 % SOLN Place 1 drop into both eyes as needed (dry eyes).     sodium chloride (OCEAN) 0.65 % SOLN nasal spray Place 1 spray into both nostrils as needed for congestion.     topiramate (TOPAMAX) 200 MG tablet Take 200 mg by mouth at bedtime.     traZODone (DESYREL) 150 MG tablet Take 150 mg by mouth at bedtime.  0   VITAMIN D PO Take 1 tablet by mouth daily.     warfarin (COUMADIN) 6 MG tablet Take 6-9 mg by mouth daily. Take '9mg'$  Monday, Wednesday, and Fridays, then all other days take '6mg'$ s daily     enoxaparin (LOVENOX) 100 MG/ML injection Inject 1 syringe into the skin every 12 (twelve) hours for 5 days. 10 mL 0   furosemide (LASIX) 20 MG tablet Take 1 tablet (20 mg total) by mouth daily for 7 days. 30 tablet 0   No current facility-administered medications for this visit.    Allergies as of 12/12/2021 - Review Complete 12/12/2021  Allergen Reaction Noted   Codeine  01/21/2020   No known allergies  01/08/2016    Vitals: BP 104/61   Pulse (!) 56   Ht '5\' 4"'$  (1.626 m)   Wt 199 lb 9.6 oz (90.5 kg)   BMI 34.26 kg/m  Last Weight:  Wt Readings from Last 1 Encounters:  12/12/21 199 lb 9.6 oz (90.5 kg)   Last Height:   Ht Readings from Last 1 Encounters:  12/12/21 '5\' 4"'$  (1.626 m)     Physical exam: Exam: Gen: NAD, conversant, well  nourised, obese, well groomed                     CV: RRR, no MRG. No Carotid Bruits. No peripheral edema, warm, nontender Eyes: Conjunctivae clear without exudates or hemorrhage MSK: tenderness at the occipital emergence  Neuro: Detailed Neurologic Exam  Speech:    Speech is normal; fluent and spontaneous with normal comprehension.  Cognition:    The patient is oriented to person, place, and time;     recent and remote memory intact;     language fluent;     normal attention, concentration,     fund of knowledge Cranial Nerves:    The pupils are equal, round, and reactive to light.attempted, could not visualize fundi. She can see shadows and some light directly in front, no peripheral vision.  Extraocular movements are intact in left eye, right eye is a prosthetic. Trigeminal sensation is intact and the muscles of mastication are normal. The face is symmetric. The palate elevates in the midline. Hearing intact. Voice is normal. Shoulder shrug is normal. The tongue has normal motion without fasciculations.   Coordination:    Normal   Gait:    Legally blind, uses a walker  Motor Observation:    No asymmetry, no atrophy, and no involuntary movements noted. Tone:    Normal muscle tone.    Posture:    Posture is normal. normal erect    Strength:    Strength is symmetrical in the upper and lower limbs.      Sensation: intact to  LT     Reflex Exam:  DTR's: hypo lowers, brisk biceps.      Toes:    The toes are downgoing bilaterally.   Clonus:    Clonus is absent.    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 Pain is bilateral and is located in the distribution of the greater, lesser and/or third occipital nerves, paroxysmal and brief, painful, sharp, with tenderness and trigger points at  the emergence of the greater occipital nerve with pain in between. Differential includes pain in the upper cervical joints or disks, suboccipital or upper posterior neck muscles including the traps/scm, spinal and posterior cranial fossa dura mater, vertebral arteries, structural and infiltrative lesions such as meningioma, schwannoma, myelitis, compressive disk disease and others. Will need MRI of the cervical spine to evaluate for surgical or interventional options (ESI) .   - MRI cervical spine: She reports very poor balance, falls, chronic neck pain > 1 year, failed conservative measures, had multiple CTs and Xrays of the neck throught the years with degenerative changes. Look for radiculopathy, myelopathy, reported ataxia. MRI of the cervical spine - Will need MRI of the cervical spine to evaluate for surgical or interventional options (ESI) .  - Physical therapy - cervical myofascial pain, cervicalgia and occipital neuralgia - [facility], include manual therapy, stretching, heating, dry needling, strength exercises or any modality per evaluation.[place].  - After MRI cervical spine, Epidural Steroid Injections or other injections into the neck - recommend Epidural Steroid INjections or medial branch blocks for neck pain and occipital neuralgia in the future as needed. Will send to [place] neurosurgery after MRI cervical spine  - Could consider medical management or occipital nerve blocks as well   Orders Placed This Encounter  Procedures   MR CERVICAL SPINE [place]   Ambulatory referral to Physical Therapy     Cc: Tommi Emery, FNP,  Darrol Jump, NP  Sarina Ill, MD  [facility] [address] [place], [place] [address]  Phone [phone] Fax [phone]

## 2021-12-12 NOTE — Patient Instructions (Addendum)
- MRI cervical spine: She reports very poor balance, falls, chronic neck pain > 1 year, failed conservative measures, had multiple CTs and Xrays of the neck throught the years with degenerative changes. Look for radiculopathy, myelopathy, reported ataxia. MRI of the cervical spine - Will need MRI of the cervical spine to evaluate for surgical or interventional options (ESI) .  - Physical therapy - cervical myofascial pain - [facility], include manual therapy, stretching, heating, dry needling, strength exercises or any modality per evaluation.[place].   - After MRI cervical spine, Epidural Steroid Injections or other injections into the neck - recommend Epidural Steroid INjections or medial branch blocks for neck pain and occipital neuralgia in the future as needed. Will send to [place] neurosurgery after MRI cervical spine  Occipital Neuralgia         Occipital neuralgia is a type of headache that causes brief episodes of very bad pain in the back of the head. Pain from occipital neuralgia may spread (radiate) to other parts of the head. These headaches may be caused by irritation of the nerves that leave the spinal cord high up in the neck, just below the base of the skull (occipital nerves). The occipital nerves transmit sensations from the back of the head, the top of the head, and the areas behind the ears. What are the causes? This condition can occur without any known cause (primary headache syndrome). In other cases, this condition is caused by pressure on or irritation of one of the two occipital nerves. Pressure and irritation may be due to: Muscle spasm in the neck. Neck injury. Wear and tear of the vertebrae in the neck (osteoarthritis). Disease of the disks that separate the vertebrae. Swollen blood vessels that put pressure on the occipital nerves. Infections. Tumors. Diabetes. What are the signs or symptoms? This condition causes brief burning, stabbing, electric,  shocking, or shooting pain in the back of the head that can radiate to the top of the head. It can happen on one side or both sides of the head. It can also cause: Pain behind the eye. Pain triggered by neck movement or hair brushing. Scalp tenderness. Aching in the back of the head between episodes of very bad pain. Pain that gets worse with exposure to bright lights. How is this diagnosed? Your health care provider may diagnose the condition based on a physical exam and your symptoms. Tests may be done, such as: Imaging studies of the brain and neck (cervical spine), such as an MRI or CT scan. These look for causes of pinched nerves. Applying pressure to the nerves in the neck to try to re-create the pain. Injection of numbing medicine into the occipital nerve areas to see if pain goes away (diagnostic nerve block). How is this treated? Treatment for this condition may begin with simple measures, such as: Rest. Massage. Applying heat or cold to the area. Over-the-counter pain relievers. If these measures do not work, you may need other treatments, including: Medicines, such as: Prescription-strength anti-inflammatory medicines. Muscle relaxants. Anti-seizure medicines, which can relieve pain. Antidepressants, which can relieve pain. Injected medicines, such as medicines that numb the area (local anesthetic) and steroids. Pulsed radiofrequency ablation. This is when wires are implanted to deliver electrical impulses that block pain signals from the occipital nerve. Surgery to relieve nerve pressure. Physical therapy. Follow these instructions at home: Managing pain     Avoid any activities that cause pain. Rest when you have an attack of pain. Try gentle massage to  relieve pain. Try a different pillow or sleeping position. If directed, apply heat to the affected area as often as told by your health care provider. Use the heat source that your health care provider recommends, such  as a moist heat pack or a heating pad. Place a towel between your skin and the heat source. Leave the heat on for 20-30 minutes. Remove the heat if your skin turns bright red. This is especially important if you are unable to feel pain, heat, or cold. You have a greater risk of getting burned. If directed, put ice on the back of your head and neck area. To do this: Put ice in a plastic bag. Place a towel between your skin and the bag. Leave the ice on for 20 minutes, 2-3 times a day. Remove the ice if your skin turns bright red. This is very important. If you cannot feel pain, heat, or cold, you have a greater risk of damage to the area. General instructions Take over-the-counter and prescription medicines only as told by your health care provider. Avoid things that make your symptoms worse, such as bright lights. Try to stay active. Get regular exercise that does not cause pain. Ask your health care provider to suggest safe exercises for you. Work with a physical therapist to learn stretching exercises you can do at home. Practice good posture. Keep all follow-up visits. This is important. Contact a health care provider if: Your medicine is not working. You have new or worsening symptoms. Get help right away if: You have very bad head pain that does not go away. You have a sudden change in vision, balance, or speech. These symptoms may represent a serious problem that is an emergency. Do not wait to see if the symptoms will go away. Get medical help right away. Call your local emergency services (911 in the [place] Do not drive yourself to the hospital. Summary Occipital neuralgia is a type of headache that causes brief episodes of very bad pain in the back of the head. Pain from occipital neuralgia may spread (radiate) to other parts of the head. Treatment for this condition includes rest, massage, and medicines. This information is not intended to replace advice given to you by your  health care provider. Make sure you discuss any questions you have with your health care provider. Document Revised: 12/19/2019 Document Reviewed: 12/19/2019 Elsevier Patient Education  [place].

## 2021-12-13 ENCOUNTER — Encounter: Payer: Self-pay | Admitting: Rehabilitative and Restorative Service Providers"

## 2021-12-13 ENCOUNTER — Ambulatory Visit: Payer: Medicare HMO | Admitting: Rehabilitative and Restorative Service Providers"

## 2021-12-13 DIAGNOSIS — M5459 Other low back pain: Secondary | ICD-10-CM

## 2021-12-13 DIAGNOSIS — R2689 Other abnormalities of gait and mobility: Secondary | ICD-10-CM

## 2021-12-13 DIAGNOSIS — M6281 Muscle weakness (generalized): Secondary | ICD-10-CM

## 2021-12-13 NOTE — Therapy (Signed)
OUTPATIENT PHYSICAL THERAPY TREATMENT NOTE AND DISCHARGE SUMMARY   Patient Name: Emily Peck MRN: [id number] DOB:09/09/1955, 65 y.o., female Today's Date: 12/13/2021  PCP: Darrol Jump, NP REFERRING PROVIDER: Alysia Penna,  MD     END OF SESSION:   PT End of Session - 12/13/21 1001     Visit Number 21    Date for PT Re-Evaluation 12/14/21    Authorization Type [employer]    Authorization Time Period 16 visits 8/21-10/13/23    Authorization - Visit Number 11    Authorization - Number of Visits 16    Progress Note Due on Visit 23    PT Start Time 0955    PT Stop Time 1035    PT Time Calculation (min) 40 min    Activity Tolerance Patient tolerated treatment well    Behavior During Therapy WFL for tasks assessed/performed                 Past Medical History:  Diagnosis Date   Anxiety    Clotting disorder ([place])    Depression    Dyspnea    GERD (gastroesophageal reflux disease)    Headache    Lumbosacral spondylosis without myelopathy    Neuropathy    Postlaminectomy syndrome, lumbar region    Postlaminectomy syndrome, thoracic region    Stroke [facility])    Past Surgical History:  Procedure Laterality Date   ABDOMINAL HYSTERECTOMY     BACK SURGERY     BIOPSY  10/02/2020   Procedure: BIOPSY;  Surgeon: Juanita Craver, MD;  Location: [place];  Service: Endoscopy;;   COLONOSCOPY WITH PROPOFOL N/A 10/02/2020   Procedure: COLONOSCOPY WITH PROPOFOL;  Surgeon: Juanita Craver, MD;  Location: [facility] ENDOSCOPY;  Service: Endoscopy;  Laterality: N/A;   ESOPHAGOGASTRODUODENOSCOPY (EGD) WITH PROPOFOL N/A 10/02/2020   Procedure: ESOPHAGOGASTRODUODENOSCOPY (EGD) WITH PROPOFOL;  Surgeon: Juanita Craver, MD;  Location: [facility] ENDOSCOPY;  Service: Endoscopy;  Laterality: N/A;   EYE SURGERY     eye removed right    GIVENS CAPSULE STUDY N/A 10/02/2020   Procedure: GIVENS CAPSULE STUDY;  Surgeon: Juanita Craver, MD;  Location: [facility] ENDOSCOPY;  Service: Endoscopy;  Laterality: N/A;   KNEE  ARTHROSCOPY     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   Procedure: TOTAL KNEE ARTHROPLASTY;  Surgeon: Melrose Nakayama, MD;  Location: [place];  Service: Orthopedics;  Laterality: Right;   Patient Active Problem List   Diagnosis Date Noted   Iron deficiency anemia 10/23/2020   Symptomatic anemia 09/30/2020   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   Category 4 blindness of left eye 04/19/2019   Nocturia more than twice per night 04/19/2019   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   Nasal septal deviation 04/19/2019   Excessive daytime sleepiness 04/19/2019   Neuropathy 04/19/2019   Sleep-related headache 04/19/2019   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[place]) 04/19/2019   Rotator cuff syndrome of both shoulders 06/09/2018   Cervicalgia 03/19/2018   Disorder of rotator cuff syndrome of right shoulder and allied disorder 01/09/2018   Primary osteoarthritis of right knee 01/09/2016   Peripheral polyneuropathy 10/19/2015   Knee pain, chronic 06/11/2013   Thoracic postlaminectomy syndrome 06/11/2013   Pes planus of both feet 06/11/2013   Trochanteric bursitis of both hips 11/06/2012   Lumbosacral spondylosis without myelopathy 05/09/2011   DEPRESSION 07/24/2006   CARPAL TUNNEL SYNDROME, RIGHT 07/24/2006   MYOPIA, 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   DEGENERATIVE JOINT DISEASE 07/24/2006   DISORDER, LUMBAR DISC W/MYELOPATHY 07/24/2006   Cerebral venous sinus thrombosis 07/24/2006  AVULSION, EYE 12/23/2005    REFERRING DIAG: M47.817 (ICD-10-CM) - Lumbosacral spondylosis without myelopathy  THERAPY DIAG:  Other low back pain  Other abnormalities of gait and mobility  Muscle weakness (generalized)  Rationale for Evaluation and Treatment Rehabilitation  PERTINENT HISTORY: 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  Requires SBA for mobility  SUBJECTIVE:   Pt reports  that she has not had to take any pain medication and she is feeling better.  States that her neurologist is referring her for a new session of PT for cervicalgia and her headaches.    PAIN:  Are you having pain? no Pain scale:  0/10 Location: low back Description:   Aggravating factor: standing too long, riding on SCAT Alleviating factor: laying in bed   Bilat knees are painful only when WB - 6/10; 0/10 at rest  PATIENT GOALS:  reduce LBP  OBJECTIVE: (objective measures completed at initial evaluation unless otherwise dated)   DIAGNOSTIC FINDINGS:  MRI 07/02/21: Progressive disc degeneration at L2-3 with mild spinal and left neural foraminal stenosis. 2. Severe L4-5 facet arthrosis with new trace anterolisthesis. No stenosis.   PATIENT SURVEYS:  08/27/21:  FOTO 37 (goal is 44) 10/18/2021:  FOTO 40% 11/13/2021:  FOTO 57%     MUSCLE LENGTH: Hamstrings limited by 25-50% with pain    POSTURE: rounded shoulders, forward head, and flexed trunk    PALPATION: Diffuse palpable tenderness over bil lumbar paraspinals and gluteals    LUMBAR ROM:    Not tested due to balance deficits.    LOWER EXTREMITY ROM:      Limited by 25-50% bilaterally with pain    LOWER EXTREMITY MMT:     Bil hips 4-/5 with pain, knees 4/5 with pain   LUMBAR SPECIAL TESTS:  Straight leg raise test: Positive and Slump test: Positive bil- due to pain with all movement    FUNCTIONAL TESTS:  08/27/21:  5x sit to stand: 21 seconds with use of hands  10/18/2021:  5 times sit to/from stand:  19.2 sec without use of UE 11/13/2021:  5 times sit to/from stand:  9.4 sec without use of UE   GAIT: Distance walked: 100 Assistive device utilized: Single point cane Level of assistance: CGA (secondary to vision) Comments: flexed trunk and antalgic gait       TODAY'S TREATMENT:  12/13/2021: Nustep level 5 (green machine) x10 minutes with PT present to discuss status Sit to/from stand holding 5# kettlebell:  x10  with chest press, x10 with overhead press Seated hip to hip and hip to shoulder holding 5# kettlebell.  2x10 each bilat Seated piriformis stretch 2x20 sec bilat Standing shoulder ER and horizontal abduction with green tband 2x10 each Seated hamstring curl with green tband 2x10 bilat Standing rocker board x2 min   12/11/21:  Pt seen for aquatic therapy today.  Treatment took place in water 3.25-4 ft in depth at the [facility] pool. Temp of water was 92.  Pt entered/exited the pool via stairs with step-to pattern with supervision and bilat rail.  * Holding wall: hip abdct x 10,  hip ext x 10; High knee marching x 10; heel/toe raises x 10;  squats x 10 - 2 sets in circuit * lightly holding wall:  SLS x 10s x 3; tandem stance x 10s x 3 each foot forward * holding white barbell:  forward walking, backward walking, side stepping, high  knee marching  - 4 laps of pool each (36 ft laps)  Pt requires buoyancy for support and to offload joints with strengthening/balance exercises. Viscosity of the water is needed for resistance of strengthening; water current perturbations provides challenge to standing balance unsupported, requiring increased core activation.  12/06/2021: Nustep level 5 (green machine) x10 minutes with PT present to discuss status Sit to/from stand holding 5# kettlebell:  x10 with chest press, x10 with overhead press Seated hip to hip and hip to shoulder holding 5# kettlebell.  2x10 each bilat Seated piriformis stretch 2x20 sec bilat Standing shoulder ER and horizontal abduction with green tband 2x10 each Seated hamstring curl with green tband 2x10 bilat Standing rocker board x2 min    PATIENT EDUCATION:  Education details: aquatics progressions Person educated: Patient Education method: Consulting civil engineer, Demonstration Education comprehension: verbalized understanding and returned demonstration     HOME EXERCISE PROGRAM: Access Code: [email or web address] URL:  [email or web address] Date: 12/11/2021 Prepared by: [place]  Exercises - Supine Lower Trunk Rotation  - 3 x daily - 7 x weekly - 1 sets - 3 reps - 20 hold - Hooklying Single Knee to Chest  - 3 x daily - 7 x weekly - 1 sets - 3 reps - 20 hold - Seated Hamstring Stretch  - 3 x daily - 7 x weekly - 1 sets - 3 reps - 20 hold - Sit to Stand Without Arm Support  - 2 x daily - 7 x weekly - 2 sets - 5 reps - Clamshell  - 2 x daily - 7 x weekly - 2 sets - 5-10 reps - Shoulder External Rotation and Scapular Retraction with Resistance  - 1 x daily - 7 x weekly - 2 sets - 10 reps - Standing Shoulder Horizontal Abduction with Resistance  - 1 x daily - 7 x weekly - 2 sets - 10 reps Aquatic HEP (issued laminated copy on 10/10) - Forward Walking  - 1 x daily - 7 x weekly - 3 sets - Side Stepping with Hand Floats  - 1 x daily - 7 x weekly - 3 sets - Standing March with Counter Support  - 1 x daily - 7 x weekly - 2 sets - 10 reps - Standing Hip Abduction AROM  - 1 x daily - 7 x weekly - 2 sets - 10 reps - Standing Hip Extension with Chair  - 1 x daily - 7 x weekly - 2 sets - 10 reps - Standing Heel Raise with Chair Support  - 1 x daily - 7 x weekly - 2 sets - 10 reps - Mini Squat with Counter Support  - 1 x daily - 7 x weekly - 2 sets - 10 reps - Standing Single Leg Stance with Counter Support  - 1 x daily - 7 x weekly - 1 sets - 2-3 reps - 10 seconds hold - Tandem Stance with Support  - 1 x daily - 7 x weekly - 1 sets - 2-3 reps - 10 seconds  hold - Standing Hamstring Stretch on Chair  - 1 x daily - 7 x weekly - 1 sets - 2 reps - 20 seconds hold   ASSESSMENT:   CLINICAL IMPRESSION: Ms Eberwein tolerated session well and has progressed, as anticipated, towards goal related activities.  Pt has met all goals for low back pain and has HEP that she can perform routinely.  Pt advised to contact [employer] about water aerobics and other seated  exercise classes, pt  verbalizes understanding.  Pt has met all outpatient PT goals at this time for low back pain and is discharged to continue with HEP.    OBJECTIVE IMPAIRMENTS Abnormal gait, decreased activity tolerance, decreased strength, increased muscle spasms, impaired flexibility, postural dysfunction, and pain.    ACTIVITY LIMITATIONS carrying, lifting, standing, squatting, and stairs   PARTICIPATION LIMITATIONS: meal prep, cleaning, laundry, and community activity   PERSONAL FACTORS Age and 1-2 comorbidities: chronic LBP, lumbar fusion, legally blind  are also affecting patient's functional outcome.    REHAB POTENTIAL: Good   CLINICAL DECISION MAKING: Evolving/moderate complexity   EVALUATION COMPLEXITY: Moderate     GOALS: Goals reviewed with patient? Yes   SHORT TERM GOALS: Target date: 09/24/2021   Be independent in initial HEP Baseline: Goal status: MET   2.  Report > or = to 25% reduction in LBP with standing and walking Baseline: 25% reported (09/19/21) Goal status: MET   3.  Verbalize and demonstrate postural corrections to improve standing posture for ADLs and gait  Baseline: flexed trunk in standing  Goal status: Goal Met       LONG TERM GOALS: Target date: 12/14/2021   Be independent in advanced HEP Baseline:  Goal status: MET on 12/13/2021   2.  Increase FOTO to > or = to 44 Baseline: 37 Goal status: Goal Met 11/13/2021   3.  Report > or = to 50% reduction in LBP with standing and walking  Baseline: 6-7/10 Goal status: GOAL MET  on 11/29/21 (reports only goes up to around a 3-4/10 now)   4.  Demonstrate erect standing posture due to improved endurance and strength > or = to 50% of the time Baseline:  Goal status: MET on 12/13/2021   5.  Perform 5x sit to stand in < or = to 13 seconds to improve balance and mobility  Baseline: 21 seconds  Goal status: Goal Met 11/13/2021         PLAN: PT FREQUENCY: 1-2x/week   PT DURATION: 8 weeks   PLANNED INTERVENTIONS:  Therapeutic exercises, Therapeutic activity, Neuromuscular re-education, Balance training, Gait training, Patient/Family education, Joint mobilization, Stair training, Aquatic Therapy, Dry Needling, Electrical stimulation, Spinal manipulation, Spinal mobilization, Cryotherapy, Moist heat, Taping, and Manual therapy.   PLAN FOR NEXT SESSION:  update HEP as indicated, Continue functional and core strength, flexibility and DN/manual therapy as needed.      PHYSICAL THERAPY DISCHARGE SUMMARY   Patient agrees to discharge. Patient goals were met. Patient is being discharged due to meeting the stated rehab goals.     Juel Burrow, PT 12/13/21 10:43 AM  [facility] [address], [place], [place] [address] Phone # [phone] Fax [phone]

## 2021-12-24 NOTE — Therapy (Addendum)
OUTPATIENT PHYSICAL THERAPY CERVICAL EVALUATION   Patient Name: Emily Peck MRN: [id number] DOB:[birth year], 66 y.o., female Today's Date: 12/25/2021   PT End of Session - 12/25/21 1045     Visit Number 1    Date for PT Re-Evaluation 02/19/22    Authorization Type [employer]- KX modifier needed now    Authorization Time Period requested visits- check on this    Progress Note Due on Visit 10    PT Start Time 1017    PT Stop Time 1050    PT Time Calculation (min) 33 min    Activity Tolerance Patient tolerated treatment well    Behavior During Therapy WFL for tasks assessed/performed             Past Medical History:  Diagnosis Date   Anxiety    Clotting disorder ([place])    Depression    Dyspnea    GERD (gastroesophageal reflux disease)    Headache    Lumbosacral spondylosis without myelopathy    Neuropathy    Postlaminectomy syndrome, lumbar region    Postlaminectomy syndrome, thoracic region    Stroke [facility])    Past Surgical History:  Procedure Laterality Date   ABDOMINAL HYSTERECTOMY     BACK SURGERY     BIOPSY  10/02/2020   Procedure: BIOPSY;  Surgeon: Juanita Craver, MD;  Location: [facility] ENDOSCOPY;  Service: Endoscopy;;   COLONOSCOPY WITH PROPOFOL N/A 10/02/2020   Procedure: COLONOSCOPY WITH PROPOFOL;  Surgeon: Juanita Craver, MD;  Location: [facility] ENDOSCOPY;  Service: Endoscopy;  Laterality: N/A;   ESOPHAGOGASTRODUODENOSCOPY (EGD) WITH PROPOFOL N/A 10/02/2020   Procedure: ESOPHAGOGASTRODUODENOSCOPY (EGD) WITH PROPOFOL;  Surgeon: Juanita Craver, MD;  Location: [facility] ENDOSCOPY;  Service: Endoscopy;  Laterality: N/A;   EYE SURGERY     eye removed right    GIVENS CAPSULE STUDY N/A 10/02/2020   Procedure: GIVENS CAPSULE STUDY;  Surgeon: Juanita Craver, MD;  Location: [facility] ENDOSCOPY;  Service: Endoscopy;  Laterality: N/A;   KNEE ARTHROSCOPY     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   Procedure: TOTAL KNEE ARTHROPLASTY;  Surgeon: Melrose Nakayama, MD;  Location: [place];  Service: Orthopedics;   Laterality: Right;   Patient Active Problem List   Diagnosis Date Noted   Iron deficiency anemia 10/23/2020   Symptomatic anemia 09/30/2020   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   Category 4 blindness of left eye 04/19/2019   Nocturia more than twice per night 04/19/2019   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   Nasal septal deviation 04/19/2019   Excessive daytime sleepiness 04/19/2019   Neuropathy 04/19/2019   Sleep-related headache 04/19/2019   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[place]) 04/19/2019   Rotator cuff syndrome of both shoulders 06/09/2018   Cervicalgia 03/19/2018   Disorder of rotator cuff syndrome of right shoulder and allied disorder 01/09/2018   Primary osteoarthritis of right knee 01/09/2016   Peripheral polyneuropathy 10/19/2015   Knee pain, chronic 06/11/2013   Thoracic postlaminectomy syndrome 06/11/2013   Pes planus of both feet 06/11/2013   Trochanteric bursitis of both hips 11/06/2012   Lumbosacral spondylosis without myelopathy 05/09/2011   DEPRESSION 07/24/2006   CARPAL TUNNEL SYNDROME, RIGHT 07/24/2006   MYOPIA, 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   DEGENERATIVE JOINT DISEASE 07/24/2006   DISORDER, LUMBAR DISC W/MYELOPATHY 07/24/2006   Cerebral venous sinus thrombosis 07/24/2006   AVULSION, EYE 12/23/2005    PCP: Darrol Jump, NP  REFERRING PROVIDER: Cheryll Cockayne, MD  REFERRING DIAG:  M79.18 (ICD-10-CM) - Cervical myofascial pain syndrome  M54.2 (ICD-10-CM) - Cervicalgia  M54.81 (ICD-10-CM) -  Bilateral occipital neuralgia    THERAPY DIAG:  Cervicalgia - Plan: PT plan of care cert/re-cert  Abnormal posture - Plan: PT plan of care cert/re-cert  Muscle weakness (generalized) - Plan: PT plan of care cert/re-cert  Rationale for Evaluation and Treatment Rehabilitation  ONSET DATE: 2022  SUBJECTIVE:                                                                                                                                                                                                          SUBJECTIVE STATEMENT: Pt presents to PT with chronic history of neck pain and tension headaches.  No known cause.  No imaging has been done.  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  Requires SBA for mobility  WEIGHT BEARING RESTRICTIONS: No  FALLS:  Has patient fallen in last 6 months? Yes. Number of falls 1 PT has addressed this in the recent past   LIVING ENVIRONMENT: Lives with: lives with their family Lives in: House/apartment Stairs: No Has following equipment at home: Single point cane  OCCUPATION: NA  PLOF: Independent with household mobility with device, Requires assistive device for independence, and Needs assistance with ADLs  PATIENT GOALS: reduce pain, reduce headaches   OBJECTIVE:   DIAGNOSTIC FINDINGS:  None of cervical spine.  Scheduled for 01/02/22  PATIENT SURVEYS:  FOTO 38 (48 is goal)  COGNITION: Overall cognitive status: Within functional limits for tasks assessed  SENSATION: WFL  POSTURE: rounded shoulders, forward head, and flexed trunk   PALPATION: Diffuse palpable tenderness over bil neck, upper trap and suboccipitals.  Significant trigger points in bil upper traps and suboccipitals.  Reduced PA mobility in the cervical and thoracic spine,   CERVICAL ROM:   Active ROM A/PROM (deg) eval  Flexion 55  Extension 25  Right lateral flexion 30  Left lateral flexion 25  Right rotation 55  Left rotation 55   (Blank rows = not tested)  UPPER EXTREMITY ROM: Limited bilaterally by 25% with pain in bil  shoulders  UPPER EXTREMITY MMT: Pain with this.  4-/5 throughout   TODAY'S TREATMENT:  DATE: 12/25/21 HEP initiated-see below.   PATIENT EDUCATION:  Education details: Access Code: [email or web address] Person educated: Patient Education method: Explanation, Demonstration, and Handouts Education comprehension: verbalized understanding and returned demonstration  HOME EXERCISE PROGRAM: Access Code: [email or web address] URL: [email or web address] Date: 12/25/2021 Prepared by: Claiborne Billings  Exercises - Seated Cervical Flexion AROM  - 3 x daily - 7 x weekly - 1 sets - 3 reps - 20 hold - Seated Cervical Sidebending AROM  - 3 x daily - 7 x weekly - 1 sets - 3 reps - 20 hold - Seated Cervical Rotation AROM  - 3 x daily - 7 x weekly - 1 sets - 3 reps - 20 hold - Seated Correct Posture  - 1 x daily - 7 x weekly - 3 sets - 10 reps - Supine Shoulder Flexion Extension AAROM with Dowel  - 3 x daily - 7 x weekly - 3 sets - 10 reps - Seated Shoulder Flexion AAROM with Dowel  - 3 x daily - 7 x weekly - 3 sets - 10 reps - Seated Shoulder Rolls  - 5 x daily - 7 x weekly - 1 sets - 10 reps  ASSESSMENT:  CLINICAL IMPRESSION: Patient is a 66 y.o. female who was seen today for physical therapy evaluation and treatment for neck pain and headaches.  Pain began > 1 year ago and pt describes 3-4/10 constant nagging neck and headache pain.  Pt is limited with activities that require movement of her head and UEs at home.  Pt with forward head and rounded shoulder posture, reduced spinal segmental mobility and trigger points and tension in the upper quarter.  Patient will benefit from skilled PT to address the below impairments and improve overall function.    OBJECTIVE IMPAIRMENTS: decreased activity tolerance, decreased ROM, decreased strength, increased muscle spasms, impaired flexibility, postural dysfunction,  and pain.   ACTIVITY LIMITATIONS: carrying, lifting, sitting, reach over head, and hygiene/grooming  PARTICIPATION LIMITATIONS: meal prep, cleaning, laundry, shopping, and community activity  PERSONAL FACTORS: Age and 1-2 comorbidities: chronic LBP, lumbar fusion, legally blind  are also affecting patient's functional outcome.  REHAB POTENTIAL: Good  CLINICAL DECISION MAKING: Stable/uncomplicated  EVALUATION COMPLEXITY: Low   GOALS: Goals reviewed with patient? Yes  SHORT TERM GOALS: Target date: 01/22/2022   Be independent in initial HEP Baseline:  Goal status: INITIAL  2.  Report > or = to 25% reduction in the frequency and intensity of headaches with daily tasks  Baseline: 3-4/10 constant pain  Goal status: INITIAL  3.  Reach overhead to a cabinet with 25% increased ease due to reduced neck pain Baseline: pain and inability to reach overhead Goal status: INITIAL   LONG TERM GOALS: Target date: 02/19/2022  Be independent in advanced HEP Baseline:  Goal status: INITIAL  2.  Improve FOTO to > or = to 48  Baseline: 32 Goal status: INITIAL  3. Report > or = to 50% reduction in the frequency and intensity of headaches with daily tasks  Baseline: 3-4/10 constant  Goal status: INITIAL  4.  Demonstrate > or = to 65-70 degrees of cervical rotation A/ROM to improve safety in the community Baseline: 55 Goal status: INITIAL     PLAN:  PT FREQUENCY: 1-2x/week  PT DURATION: 8 weeks  PLANNED INTERVENTIONS: Therapeutic exercises, Therapeutic activity, Neuromuscular re-education, Balance training, Gait training, Patient/Family education, Self Care, Joint mobilization, Joint manipulation, Aquatic Therapy, Dry Needling, Cryotherapy, Moist heat, Taping, Traction, Ultrasound, Manual therapy, and Re-evaluation  PLAN FOR NEXT SESSION: DN  to neck, review HEP, begin postural strength, UE A/ROM   Sigurd Sos, PT 12/25/21 11:13 AM  [facility] [address], [place], [place] [address] Phone # [phone] Fax [phone]

## 2021-12-25 ENCOUNTER — Other Ambulatory Visit: Payer: Self-pay

## 2021-12-25 ENCOUNTER — Ambulatory Visit: Payer: Medicare HMO | Attending: Neurology

## 2021-12-25 DIAGNOSIS — M7918 Myalgia, other site: Secondary | ICD-10-CM | POA: Diagnosis not present

## 2021-12-25 DIAGNOSIS — R293 Abnormal posture: Secondary | ICD-10-CM

## 2021-12-25 DIAGNOSIS — M5481 Occipital neuralgia: Secondary | ICD-10-CM | POA: Diagnosis not present

## 2021-12-25 DIAGNOSIS — M542 Cervicalgia: Secondary | ICD-10-CM | POA: Diagnosis not present

## 2021-12-25 DIAGNOSIS — M6281 Muscle weakness (generalized): Secondary | ICD-10-CM

## 2022-01-02 ENCOUNTER — Other Ambulatory Visit: Payer: Medicare HMO

## 2022-01-03 ENCOUNTER — Ambulatory Visit: Payer: Medicare HMO | Attending: Neurology | Admitting: Rehabilitative and Restorative Service Providers"

## 2022-01-03 ENCOUNTER — Encounter: Payer: Self-pay | Admitting: Rehabilitative and Restorative Service Providers"

## 2022-01-03 DIAGNOSIS — R293 Abnormal posture: Secondary | ICD-10-CM | POA: Diagnosis present

## 2022-01-03 DIAGNOSIS — M542 Cervicalgia: Secondary | ICD-10-CM | POA: Diagnosis not present

## 2022-01-03 DIAGNOSIS — M6281 Muscle weakness (generalized): Secondary | ICD-10-CM | POA: Insufficient documentation

## 2022-01-03 NOTE — Therapy (Signed)
OUTPATIENT PHYSICAL THERAPY TREATMENT NOTE   Patient Name: Emily Peck MRN: [id number] DOB:Jun 14, 1955, 66 y.o., female Today's Date: 01/03/2022   PT End of Session - 01/03/22 1106     Visit Number 2    Date for PT Re-Evaluation 02/19/22    Authorization Type [employer]    Authorization Time Period 12/25/2021 - 02/19/2022    Authorization - Visit Number 2    Authorization - Number of Visits 16    Progress Note Due on Visit 10    PT Start Time 1100    PT Stop Time 1140    PT Time Calculation (min) 40 min    Activity Tolerance Patient tolerated treatment well    Behavior During Therapy WFL for tasks assessed/performed             Past Medical History:  Diagnosis Date   Anxiety    Clotting disorder ([place])    Depression    Dyspnea    GERD (gastroesophageal reflux disease)    Headache    Lumbosacral spondylosis without myelopathy    Neuropathy    Postlaminectomy syndrome, lumbar region    Postlaminectomy syndrome, thoracic region    Stroke [facility])    Past Surgical History:  Procedure Laterality Date   ABDOMINAL HYSTERECTOMY     BACK SURGERY     BIOPSY  10/02/2020   Procedure: BIOPSY;  Surgeon: Juanita Craver, MD;  Location: [place];  Service: Endoscopy;;   COLONOSCOPY WITH PROPOFOL N/A 10/02/2020   Procedure: COLONOSCOPY WITH PROPOFOL;  Surgeon: Juanita Craver, MD;  Location: [facility] ENDOSCOPY;  Service: Endoscopy;  Laterality: N/A;   ESOPHAGOGASTRODUODENOSCOPY (EGD) WITH PROPOFOL N/A 10/02/2020   Procedure: ESOPHAGOGASTRODUODENOSCOPY (EGD) WITH PROPOFOL;  Surgeon: Juanita Craver, MD;  Location: [facility] ENDOSCOPY;  Service: Endoscopy;  Laterality: N/A;   EYE SURGERY     eye removed right    GIVENS CAPSULE STUDY N/A 10/02/2020   Procedure: GIVENS CAPSULE STUDY;  Surgeon: Juanita Craver, MD;  Location: [facility] ENDOSCOPY;  Service: Endoscopy;  Laterality: N/A;   KNEE ARTHROSCOPY     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   Procedure: TOTAL KNEE ARTHROPLASTY;  Surgeon: Melrose Nakayama, MD;   Location: [place];  Service: Orthopedics;  Laterality: Right;   Patient Active Problem List   Diagnosis Date Noted   Iron deficiency anemia 10/23/2020   Symptomatic anemia 09/30/2020   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   Category 4 blindness of left eye 04/19/2019   Nocturia more than twice per night 04/19/2019   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   Nasal septal deviation 04/19/2019   Excessive daytime sleepiness 04/19/2019   Neuropathy 04/19/2019   Sleep-related headache 04/19/2019   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[place]) 04/19/2019   Rotator cuff syndrome of both shoulders 06/09/2018   Cervicalgia 03/19/2018   Disorder of rotator cuff syndrome of right shoulder and allied disorder 01/09/2018   Primary osteoarthritis of right knee 01/09/2016   Peripheral polyneuropathy 10/19/2015   Knee pain, chronic 06/11/2013   Thoracic postlaminectomy syndrome 06/11/2013   Pes planus of both feet 06/11/2013   Trochanteric bursitis of both hips 11/06/2012   Lumbosacral spondylosis without myelopathy 05/09/2011   DEPRESSION 07/24/2006   CARPAL TUNNEL SYNDROME, RIGHT 07/24/2006   MYOPIA, 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   DEGENERATIVE JOINT DISEASE 07/24/2006   DISORDER, LUMBAR DISC W/MYELOPATHY 07/24/2006   Cerebral venous sinus thrombosis 07/24/2006   AVULSION, EYE 12/23/2005    PCP: Darrol Jump, NP  REFERRING PROVIDER: Cheryll Cockayne, MD  REFERRING DIAG:  M79.18 (ICD-10-CM) - Cervical  myofascial pain syndrome  M54.2 (ICD-10-CM) - Cervicalgia  M54.81 (ICD-10-CM) - Bilateral occipital neuralgia    THERAPY DIAG:  Cervicalgia  Abnormal posture  Muscle weakness (generalized)  Rationale for Evaluation and Treatment Rehabilitation  ONSET DATE: 2022  SUBJECTIVE:                                                                                                                                                                                                          SUBJECTIVE STATEMENT: Pt reports that she has been having some dizziness and has started taking some medication.  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  Requires SBA for mobility  WEIGHT BEARING RESTRICTIONS: No  FALLS:  Has patient fallen in last 6 months? Yes. Number of falls 1 PT has addressed this in the recent past   LIVING ENVIRONMENT: Lives with: lives with their family Lives in: House/apartment Stairs: No Has following equipment at home: Single point cane  OCCUPATION: NA  PLOF: Independent with household mobility with device, Requires assistive device for independence, and Needs assistance with ADLs  PATIENT GOALS: reduce pain, reduce headaches   OBJECTIVE:   DIAGNOSTIC FINDINGS:  None of cervical spine.  Scheduled for 01/02/22  PATIENT SURVEYS:  12/25/2021:  FOTO 32 (48 is goal)  COGNITION: Overall cognitive status: Within functional limits for tasks assessed  SENSATION: WFL  POSTURE: rounded shoulders, forward head, and flexed trunk   PALPATION: Diffuse palpable tenderness over bil neck, upper trap and suboccipitals.  Significant trigger points in bil upper traps and suboccipitals.  Reduced PA mobility in the cervical and thoracic spine,   CERVICAL ROM:   Active ROM A/PROM (deg) eval  Flexion 55  Extension 25  Right lateral flexion 30  Left lateral flexion 25  Right rotation 55  Left rotation 55   (Blank rows = not tested)  UPPER EXTREMITY ROM: 12/25/2021:  Limited bilaterally by 25% with pain in bil shoulders   UPPER EXTREMITY MMT: 12/25/2021:  Pain with this.  4-/5 throughout   TODAY'S  TREATMENT: DATE 01/03/2022: UBE level 1.0 x3 min each direction with PT present to discuss status Seated cervical rotation and extension 2x10 Shoulder rolls 2x10 Seated chin tuck 2x10 Seated shoulder flexion with cane 2x10 Shoulder ER and  horizontal abduction with red tband 2x10 each Trigger Point Dry-Needling  Treatment instructions: Expect mild to moderate muscle soreness. S/S of pneumothorax if dry needled over a lung field, and to seek immediate medical attention should they occur. Patient verbalized understanding of these instructions and education. Patient Consent Given: Yes Education handout provided: Yes Muscles treated: bilateral suboccipital, bilateral cervical multifidi, right upper trap Electrical stimulation performed: No Parameters: N/A Treatment response/outcome: Utilized skilled palpation to identify trigger points.  Able to palpate muscle twitch and elongation following.    DATE: 12/25/21 HEP initiated-see below.   PATIENT EDUCATION:  Education details: Access Code: [email or web address] Person educated: Patient Education method: Explanation, Demonstration, and Handouts Education comprehension: verbalized understanding and returned demonstration  HOME EXERCISE PROGRAM: Access Code: [email or web address] URL: [email or web address] Date: 12/25/2021 Prepared by: Claiborne Billings  Exercises - Seated Cervical Flexion AROM  - 3 x daily - 7 x weekly - 1 sets - 3 reps - 20 hold - Seated Cervical Sidebending AROM  - 3 x daily - 7 x weekly - 1 sets - 3 reps - 20 hold - Seated Cervical Rotation AROM  - 3 x daily - 7 x weekly - 1 sets - 3 reps - 20 hold - Seated Correct Posture  - 1 x daily - 7 x weekly - 3 sets - 10 reps - Supine Shoulder Flexion Extension AAROM with Dowel  - 3 x daily - 7 x weekly - 3 sets - 10 reps - Seated Shoulder Flexion AAROM with Dowel  - 3 x daily - 7 x weekly - 3 sets - 10 reps - Seated Shoulder Rolls  - 5 x daily - 7 x weekly - 1 sets - 10  reps   ASSESSMENT:  CLINICAL IMPRESSION: Ms Keilman presents to skilled PT reporting that she has been having some dizziness recently.  Reports that her cervical pain is similar to evaluation. Pt reports that she is wanting to try dry needling again today, as she had good results on this with her back.  Pt reports some relief of her pain with dry needling.  Pt continues to require skilled PT to progress towards goal related activities.   OBJECTIVE IMPAIRMENTS: decreased activity tolerance, decreased ROM, decreased strength, increased muscle spasms, impaired flexibility, postural dysfunction, and pain.   ACTIVITY LIMITATIONS: carrying, lifting, sitting, reach over head, and hygiene/grooming  PARTICIPATION LIMITATIONS: meal prep, cleaning, laundry, shopping, and community activity  PERSONAL FACTORS: Age and 1-2 comorbidities: chronic LBP, lumbar fusion, legally blind  are also affecting patient's functional outcome.  REHAB POTENTIAL: Good  CLINICAL DECISION MAKING: Stable/uncomplicated  EVALUATION COMPLEXITY: Low   GOALS: Goals reviewed with patient? Yes  SHORT TERM GOALS: Target date: 01/22/2022   Be independent in initial HEP Baseline:  Goal status: IN PROGRESS  2.  Report > or = to 25% reduction in the frequency and intensity of headaches with daily tasks  Baseline: 3-4/10 constant pain  Goal status: INITIAL  3.  Reach overhead to a cabinet with 25% increased ease due to reduced neck pain Baseline: pain and inability to reach overhead Goal status: INITIAL   LONG TERM GOALS: Target date: 02/19/2022  Be independent in advanced HEP Baseline:  Goal status: INITIAL  2.  Improve FOTO to > or = to 48  Baseline: 32 Goal status: INITIAL  3. Report > or = to 50% reduction in the frequency and intensity of headaches with daily tasks  Baseline: 3-4/10 constant  Goal status: INITIAL  4.  Demonstrate > or = to 65-70  degrees of cervical rotation A/ROM to improve safety in  the community Baseline: 55 Goal status: INITIAL     PLAN:  PT FREQUENCY: 1-2x/week  PT DURATION: 8 weeks  PLANNED INTERVENTIONS: Therapeutic exercises, Therapeutic activity, Neuromuscular re-education, Balance training, Gait training, Patient/Family education, Self Care, Joint mobilization, Joint manipulation, Aquatic Therapy, Dry Needling, Cryotherapy, Moist heat, Taping, Traction, Ultrasound, Manual therapy, and Re-evaluation  PLAN FOR NEXT SESSION: DN to neck, review HEP, begin postural strength, UE A/ROM   Juel Burrow, PT 01/03/22 11:57 AM   [facility] Services [address] [place], [place] [address] Phone # [phone] Fax [phone]

## 2022-01-03 NOTE — Patient Instructions (Signed)

## 2022-01-08 ENCOUNTER — Encounter: Payer: Self-pay | Admitting: Rehabilitative and Restorative Service Providers"

## 2022-01-08 ENCOUNTER — Ambulatory Visit: Payer: Medicare HMO | Admitting: Rehabilitative and Restorative Service Providers"

## 2022-01-08 DIAGNOSIS — M542 Cervicalgia: Secondary | ICD-10-CM

## 2022-01-08 DIAGNOSIS — R293 Abnormal posture: Secondary | ICD-10-CM

## 2022-01-08 DIAGNOSIS — M6281 Muscle weakness (generalized): Secondary | ICD-10-CM

## 2022-01-08 NOTE — Therapy (Signed)
OUTPATIENT PHYSICAL THERAPY TREATMENT NOTE   Patient Name: Emily Peck MRN: [id number] DOB:[birth year], 66 y.o., female Today's Date: 01/08/2022   PT End of Session - 01/08/22 1045     Visit Number 3    Date for PT Re-Evaluation 02/19/22    Authorization Type [employer]    Authorization Time Period 12/25/2021 - 02/19/2022    Authorization - Visit Number 3    Authorization - Number of Visits 16    Progress Note Due on Visit 10    PT Start Time 7672    PT Stop Time 1120    PT Time Calculation (min) 40 min    Activity Tolerance Patient tolerated treatment well    Behavior During Therapy WFL for tasks assessed/performed             Past Medical History:  Diagnosis Date   Anxiety    Clotting disorder ([place])    Depression    Dyspnea    GERD (gastroesophageal reflux disease)    Headache    Lumbosacral spondylosis without myelopathy    Neuropathy    Postlaminectomy syndrome, lumbar region    Postlaminectomy syndrome, thoracic region    Stroke [facility])    Past Surgical History:  Procedure Laterality Date   ABDOMINAL HYSTERECTOMY     BACK SURGERY     BIOPSY  10/02/2020   Procedure: BIOPSY;  Surgeon: Juanita Craver, MD;  Location: [place];  Service: Endoscopy;;   COLONOSCOPY WITH PROPOFOL N/A 10/02/2020   Procedure: COLONOSCOPY WITH PROPOFOL;  Surgeon: Juanita Craver, MD;  Location: [facility] ENDOSCOPY;  Service: Endoscopy;  Laterality: N/A;   ESOPHAGOGASTRODUODENOSCOPY (EGD) WITH PROPOFOL N/A 10/02/2020   Procedure: ESOPHAGOGASTRODUODENOSCOPY (EGD) WITH PROPOFOL;  Surgeon: Juanita Craver, MD;  Location: [facility] ENDOSCOPY;  Service: Endoscopy;  Laterality: N/A;   EYE SURGERY     eye removed right    GIVENS CAPSULE STUDY N/A 10/02/2020   Procedure: GIVENS CAPSULE STUDY;  Surgeon: Juanita Craver, MD;  Location: [facility] ENDOSCOPY;  Service: Endoscopy;  Laterality: N/A;   KNEE ARTHROSCOPY     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   Procedure: TOTAL KNEE ARTHROPLASTY;  Surgeon: Melrose Nakayama, MD;   Location: [place];  Service: Orthopedics;  Laterality: Right;   Patient Active Problem List   Diagnosis Date Noted   Iron deficiency anemia 10/23/2020   Symptomatic anemia 09/30/2020   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   Category 4 blindness of left eye 04/19/2019   Nocturia more than twice per night 04/19/2019   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   Nasal septal deviation 04/19/2019   Excessive daytime sleepiness 04/19/2019   Neuropathy 04/19/2019   Sleep-related headache 04/19/2019   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[place]) 04/19/2019   Rotator cuff syndrome of both shoulders 06/09/2018   Cervicalgia 03/19/2018   Disorder of rotator cuff syndrome of right shoulder and allied disorder 01/09/2018   Primary osteoarthritis of right knee 01/09/2016   Peripheral polyneuropathy 10/19/2015   Knee pain, chronic 06/11/2013   Thoracic postlaminectomy syndrome 06/11/2013   Pes planus of both feet 06/11/2013   Trochanteric bursitis of both hips 11/06/2012   Lumbosacral spondylosis without myelopathy 05/09/2011   DEPRESSION 07/24/2006   CARPAL TUNNEL SYNDROME, RIGHT 07/24/2006   MYOPIA, 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   DEGENERATIVE JOINT DISEASE 07/24/2006   DISORDER, LUMBAR DISC W/MYELOPATHY 07/24/2006   Cerebral venous sinus thrombosis 07/24/2006   AVULSION, EYE 12/23/2005    PCP: Darrol Jump, NP  REFERRING PROVIDER: Cheryll Cockayne, MD  REFERRING DIAG:  M79.18 (ICD-10-CM) - Cervical  myofascial pain syndrome  M54.2 (ICD-10-CM) - Cervicalgia  M54.81 (ICD-10-CM) - Bilateral occipital neuralgia    THERAPY DIAG:  Cervicalgia  Abnormal posture  Muscle weakness (generalized)  Rationale for Evaluation and Treatment Rehabilitation  ONSET DATE: 2022  SUBJECTIVE:                                                                                                                                                                                                          SUBJECTIVE STATEMENT: Pt denies dizziness today, but states that she took her medication just in case.  Pt reports that she is having some left knee swelling today.  Pt reports that she felt great after dry needling last session.  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  Requires SBA for mobility  WEIGHT BEARING RESTRICTIONS: No  FALLS:  Has patient fallen in last 6 months? Yes. Number of falls 1 PT has addressed this in the recent past   LIVING ENVIRONMENT: Lives with: lives with their family Lives in: House/apartment Stairs: No Has following equipment at home: Single point cane  OCCUPATION: NA  PLOF: Independent with household mobility with device, Requires assistive device for independence, and Needs assistance with ADLs  PATIENT GOALS: reduce pain, reduce headaches   OBJECTIVE:   DIAGNOSTIC FINDINGS:  None of cervical spine.  Scheduled for 01/02/22  PATIENT SURVEYS:  12/25/2021:  FOTO 32 (48 is goal)  COGNITION: Overall cognitive status: Within functional limits for tasks assessed  SENSATION: WFL  POSTURE: rounded shoulders, forward head, and flexed trunk   PALPATION: Diffuse palpable tenderness over bil neck, upper trap and suboccipitals.  Significant trigger points in bil upper traps and suboccipitals.  Reduced PA mobility in the cervical and thoracic spine,   CERVICAL ROM:   Active ROM A/PROM (deg) eval  Flexion 55  Extension 25  Right lateral flexion 30  Left lateral flexion 25  Right rotation 55  Left rotation 55   (Blank rows = not tested)  UPPER EXTREMITY ROM: 12/25/2021:  Limited bilaterally by 25% with  pain in bil shoulders   UPPER EXTREMITY MMT: 12/25/2021:  Pain with this.  4-/5 throughout   TODAY'S TREATMENT: DATE 01/08/2022: UBE level 1.0 x3 min each direction with PT present to discuss  status Seated cervical rotation and extension 2x10 Seated chin tuck 2x10 Seated shoulder flexion with 1# on cane 2x10 Shoulder ER and horizontal abduction with red tband 2x10 each Standing shoulder row with red tband 2x10 Trigger Point Dry-Needling  Treatment instructions: Expect mild to moderate muscle soreness. S/S of pneumothorax if dry needled over a lung field, and to seek immediate medical attention should they occur. Patient verbalized understanding of these instructions and education. Patient Consent Given: Yes Education handout provided: Yes Muscles treated: bilateral suboccipital, bilateral cervical multifidi, right upper trap Electrical stimulation performed: No Parameters: N/A Treatment response/outcome: Utilized skilled palpation to identify trigger points.  Able to palpate muscle twitch and elongation following.   DATE 01/03/2022: UBE level 1.0 x3 min each direction with PT present to discuss status Seated cervical rotation and extension 2x10 Shoulder rolls 2x10 Seated chin tuck 2x10 Seated shoulder flexion with cane 2x10 Shoulder ER and horizontal abduction with red tband 2x10 each Trigger Point Dry-Needling  Treatment instructions: Expect mild to moderate muscle soreness. S/S of pneumothorax if dry needled over a lung field, and to seek immediate medical attention should they occur. Patient verbalized understanding of these instructions and education. Patient Consent Given: Yes Education handout provided: Yes Muscles treated: bilateral suboccipital, bilateral cervical multifidi, right upper trap Electrical stimulation performed: No Parameters: N/A Treatment response/outcome: Utilized skilled palpation to identify trigger points.  Able to palpate muscle twitch and elongation  following.    DATE: 12/25/21 HEP initiated-see below.   PATIENT EDUCATION:  Education details: Access Code: [email or web address] Person educated: Patient Education method: Explanation, Demonstration, and Handouts Education comprehension: verbalized understanding and returned demonstration  HOME EXERCISE PROGRAM: Access Code: [email or web address] URL: [email or web address] Date: 12/25/2021 Prepared by: Claiborne Billings  Exercises - Seated Cervical Flexion AROM  - 3 x daily - 7 x weekly - 1 sets - 3 reps - 20 hold - Seated Cervical Sidebending AROM  - 3 x daily - 7 x weekly - 1 sets - 3 reps - 20 hold - Seated Cervical Rotation AROM  - 3 x daily - 7 x weekly - 1 sets - 3 reps - 20 hold - Seated Correct Posture  - 1 x daily - 7 x weekly - 3 sets - 10 reps - Supine Shoulder Flexion Extension AAROM with Dowel  - 3 x daily - 7 x weekly - 3 sets - 10 reps - Seated Shoulder Flexion AAROM with Dowel  - 3 x daily - 7 x weekly - 3 sets - 10 reps - Seated Shoulder Rolls  - 5 x daily - 7 x weekly - 1 sets - 10 reps   ASSESSMENT:  CLINICAL IMPRESSION: Ms Molden presents to skilled PT reporting that she is having some left knee pain and edema, but admits that she was up on her feet more this weekend for her birthday party and was performing the electric slide.  Pt reports a great relief in pain with dry needling last session and requests again, as she only has one appointment this week, due to a MRI scheduled tomorrow.  Following dry needling, pt reports continued relief of pain.  Pt continues to require skilled PT to progress towards goal related activities.   OBJECTIVE IMPAIRMENTS: decreased activity tolerance, decreased ROM, decreased strength, increased muscle spasms, impaired flexibility, postural dysfunction, and pain.   ACTIVITY LIMITATIONS: carrying, lifting, sitting, reach over head, and hygiene/grooming  PARTICIPATION LIMITATIONS: meal prep, cleaning, laundry, shopping, and community  activity  PERSONAL FACTORS: Age and 1-2 comorbidities: chronic LBP, lumbar fusion,  legally blind  are also affecting patient's functional outcome.  REHAB POTENTIAL: Good  CLINICAL DECISION MAKING: Stable/uncomplicated  EVALUATION COMPLEXITY: Low   GOALS: Goals reviewed with patient? Yes  SHORT TERM GOALS: Target date: 01/22/2022   Be independent in initial HEP Baseline:  Goal status: IN PROGRESS  2.  Report > or = to 25% reduction in the frequency and intensity of headaches with daily tasks  Baseline: 3-4/10 constant pain  Goal status: INITIAL  3.  Reach overhead to a cabinet with 25% increased ease due to reduced neck pain Baseline: pain and inability to reach overhead Goal status: INITIAL   LONG TERM GOALS: Target date: 02/19/2022  Be independent in advanced HEP Baseline:  Goal status: INITIAL  2.  Improve FOTO to > or = to 48  Baseline: 32 Goal status: INITIAL  3. Report > or = to 50% reduction in the frequency and intensity of headaches with daily tasks  Baseline: 3-4/10 constant  Goal status: INITIAL  4.  Demonstrate > or = to 65-70 degrees of cervical rotation A/ROM to improve safety in the community Baseline: 55 Goal status: INITIAL     PLAN:  PT FREQUENCY: 1-2x/week  PT DURATION: 8 weeks  PLANNED INTERVENTIONS: Therapeutic exercises, Therapeutic activity, Neuromuscular re-education, Balance training, Gait training, Patient/Family education, Self Care, Joint mobilization, Joint manipulation, Aquatic Therapy, Dry Needling, Cryotherapy, Moist heat, Taping, Traction, Ultrasound, Manual therapy, and Re-evaluation  PLAN FOR NEXT SESSION: DN to neck, review HEP, begin postural strength, UE A/ROM   Juel Burrow, PT 01/08/22 11:27 AM   [facility] [address] [place], [place] [address] Phone # [phone] Fax [phone]

## 2022-01-09 ENCOUNTER — Ambulatory Visit
Admission: RE | Admit: 2022-01-09 | Discharge: 2022-01-09 | Disposition: A | Discharge status: Home / Self Care | Payer: Medicare HMO | Source: Ambulatory Visit | Attending: Neurology | Admitting: Neurology

## 2022-01-09 DIAGNOSIS — M5412 Radiculopathy, cervical region: Secondary | ICD-10-CM

## 2022-01-09 DIAGNOSIS — R270 Ataxia, unspecified: Secondary | ICD-10-CM

## 2022-01-09 DIAGNOSIS — W19XXXA Unspecified fall, initial encounter: Secondary | ICD-10-CM

## 2022-01-09 DIAGNOSIS — M542 Cervicalgia: Secondary | ICD-10-CM

## 2022-01-09 DIAGNOSIS — R29898 Other symptoms and signs involving the musculoskeletal system: Secondary | ICD-10-CM

## 2022-01-09 DIAGNOSIS — M5000 Cervical disc disorder with myelopathy, unspecified cervical region: Secondary | ICD-10-CM

## 2022-01-09 DIAGNOSIS — R292 Abnormal reflex: Secondary | ICD-10-CM

## 2022-01-15 ENCOUNTER — Encounter: Payer: Self-pay | Admitting: Rehabilitative and Restorative Service Providers"

## 2022-01-15 ENCOUNTER — Ambulatory Visit: Payer: Medicare HMO | Admitting: Rehabilitative and Restorative Service Providers"

## 2022-01-15 DIAGNOSIS — M542 Cervicalgia: Secondary | ICD-10-CM

## 2022-01-15 DIAGNOSIS — R293 Abnormal posture: Secondary | ICD-10-CM

## 2022-01-15 DIAGNOSIS — M6281 Muscle weakness (generalized): Secondary | ICD-10-CM

## 2022-01-15 NOTE — Therapy (Signed)
OUTPATIENT PHYSICAL THERAPY TREATMENT NOTE   Patient Name: Emily Peck MRN: [id number] DOB:[birth year], 66 y.o., female Today's Date: 01/15/2022   PT End of Session - 01/15/22 1019     Visit Number 4    Date for PT Re-Evaluation 02/19/22    Authorization Type [employer]    Authorization Time Period 12/25/2021 - 02/19/2022    Authorization - Visit Number 4    Authorization - Number of Visits 16    Progress Note Due on Visit 10    PT Start Time 7322    PT Stop Time 1055    PT Time Calculation (min) 40 min    Activity Tolerance Patient tolerated treatment well    Behavior During Therapy WFL for tasks assessed/performed             Past Medical History:  Diagnosis Date   Anxiety    Clotting disorder ([place])    Depression    Dyspnea    GERD (gastroesophageal reflux disease)    Headache    Lumbosacral spondylosis without myelopathy    Neuropathy    Postlaminectomy syndrome, lumbar region    Postlaminectomy syndrome, thoracic region    Stroke [facility])    Past Surgical History:  Procedure Laterality Date   ABDOMINAL HYSTERECTOMY     BACK SURGERY     BIOPSY  10/02/2020   Procedure: BIOPSY;  Surgeon: Juanita Craver, MD;  Location: [place];  Service: Endoscopy;;   COLONOSCOPY WITH PROPOFOL N/A 10/02/2020   Procedure: COLONOSCOPY WITH PROPOFOL;  Surgeon: Juanita Craver, MD;  Location[facility] ENDOSCOPY;  Service: Endoscopy;  Laterality: N/A;   ESOPHAGOGASTRODUODENOSCOPY (EGD) WITH PROPOFOL N/A 10/02/2020   Procedure: ESOPHAGOGASTRODUODENOSCOPY (EGD) WITH PROPOFOL;  Surgeon: Juanita Craver, MD;  Location: [facility] ENDOSCOPY;  Service: Endoscopy;  Laterality: N/A;   EYE SURGERY     eye removed right    GIVENS CAPSULE STUDY N/A 10/02/2020   Procedure: GIVENS CAPSULE STUDY;  Surgeon: Juanita Craver, MD;  Location: [facility] ENDOSCOPY;  Service: Endoscopy;  Laterality: N/A;   KNEE ARTHROSCOPY     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   Procedure: TOTAL KNEE ARTHROPLASTY;  Surgeon: Melrose Nakayama, MD;   Location: [place];  Service: Orthopedics;  Laterality: Right;   Patient Active Problem List   Diagnosis Date Noted   Iron deficiency anemia 10/23/2020   Symptomatic anemia 09/30/2020   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   Category 4 blindness of left eye 04/19/2019   Nocturia more than twice per night 04/19/2019   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   Nasal septal deviation 04/19/2019   Excessive daytime sleepiness 04/19/2019   Neuropathy 04/19/2019   Sleep-related headache 04/19/2019   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[place]) 04/19/2019   Rotator cuff syndrome of both shoulders 06/09/2018   Cervicalgia 03/19/2018   Disorder of rotator cuff syndrome of right shoulder and allied disorder 01/09/2018   Primary osteoarthritis of right knee 01/09/2016   Peripheral polyneuropathy 10/19/2015   Knee pain, chronic 06/11/2013   Thoracic postlaminectomy syndrome 06/11/2013   Pes planus of both feet 06/11/2013   Trochanteric bursitis of both hips 11/06/2012   Lumbosacral spondylosis without myelopathy 05/09/2011   DEPRESSION 07/24/2006   CARPAL TUNNEL SYNDROME, RIGHT 07/24/2006   MYOPIA, 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   DEGENERATIVE JOINT DISEASE 07/24/2006   DISORDER, LUMBAR DISC W/MYELOPATHY 07/24/2006   Cerebral venous sinus thrombosis 07/24/2006   AVULSION, EYE 12/23/2005    PCP: Darrol Jump, NP  REFERRING PROVIDER: Cheryll Cockayne, MD  REFERRING DIAG:  M79.18 (ICD-10-CM) - Cervical  myofascial pain syndrome  M54.2 (ICD-10-CM) - Cervicalgia  M54.81 (ICD-10-CM) - Bilateral occipital neuralgia    THERAPY DIAG:  Cervicalgia  Abnormal posture  Muscle weakness (generalized)  Rationale for Evaluation and Treatment Rehabilitation  ONSET DATE: 2022  SUBJECTIVE:                                                                                                                                                                                                          SUBJECTIVE STATEMENT: Pt reports occasional dizziness.  Reports being careful when she gets out of bed to decrease the chance that she will fall.  States that she has at least 25% less frequent headaches.  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  Requires SBA for mobility  WEIGHT BEARING RESTRICTIONS: No  FALLS:  Has patient fallen in last 6 months? Yes. Number of falls 1 PT has addressed this in the recent past   LIVING ENVIRONMENT: Lives with: lives with their family Lives in: House/apartment Stairs: No Has following equipment at home: Single point cane  OCCUPATION: NA  PLOF: Independent with household mobility with device, Requires assistive device for independence, and Needs assistance with ADLs  PATIENT GOALS: reduce pain, reduce headaches   OBJECTIVE:   DIAGNOSTIC FINDINGS:  None of cervical spine.  Scheduled for 01/02/22  PATIENT SURVEYS:  12/25/2021:  FOTO 32 (48 is goal)  COGNITION: Overall cognitive status: Within functional limits for tasks assessed  SENSATION: WFL  POSTURE: rounded shoulders, forward head, and flexed trunk   PALPATION: Diffuse palpable tenderness over bil neck, upper trap and suboccipitals.  Significant trigger points in bil upper traps and suboccipitals.  Reduced PA mobility in the cervical and thoracic spine,   CERVICAL ROM:   Active ROM A/PROM (deg) eval  Flexion 55  Extension 25  Right lateral flexion 30  Left lateral flexion 25  Right rotation 55  Left rotation 55   (Blank rows = not tested)  UPPER EXTREMITY ROM: 12/25/2021:  Limited bilaterally by 25% with pain in bil shoulders    UPPER EXTREMITY MMT: 12/25/2021:  Pain with this.  4-/5 throughout   TODAY'S TREATMENT: DATE 01/15/2022: UBE level 1.0 x3 min each direction with PT present to discuss status Seated cervical rotation and  extension 2x10 Seated chin tuck 2x10 Sit to/from stand holding 5# kettlebell:  x10 with chest press, x10 with overhead press Seated shoulder flexion with 1# on cane 2x10 Shoulder ER and horizontal abduction with red tband 2x10 each Standing shoulder row and shoulder extension with red tband 2x10 each Seated serratus shoulder flexion with green loop 2x10 (pt requires max cuing for technique and posture)   DATE 01/08/2022: UBE level 1.0 x3 min each direction with PT present to discuss status Seated cervical rotation and extension 2x10 Seated chin tuck 2x10 Seated shoulder flexion with 1# on cane 2x10 Shoulder ER and horizontal abduction with red tband 2x10 each Standing shoulder row with red tband 2x10 Trigger Point Dry-Needling  Treatment instructions: Expect mild to moderate muscle soreness. S/S of pneumothorax if dry needled over a lung field, and to seek immediate medical attention should they occur. Patient verbalized understanding of these instructions and education. Patient Consent Given: Yes Education handout provided: Yes Muscles treated: bilateral suboccipital, bilateral cervical multifidi, right upper trap Electrical stimulation performed: No Parameters: N/A Treatment response/outcome: Utilized skilled palpation to identify trigger points.  Able to palpate muscle twitch and elongation following.   DATE 01/03/2022: UBE level 1.0 x3 min each direction with PT present to discuss status Seated cervical rotation and extension 2x10 Shoulder rolls 2x10 Seated chin tuck 2x10 Seated shoulder flexion with cane 2x10 Shoulder ER and horizontal abduction with red tband 2x10 each Trigger Point Dry-Needling  Treatment instructions: Expect mild to moderate muscle soreness. S/S of  pneumothorax if dry needled over a lung field, and to seek immediate medical attention should they occur. Patient verbalized understanding of these instructions and education. Patient Consent Given: Yes Education handout provided: Yes Muscles treated: bilateral suboccipital, bilateral cervical multifidi, right upper trap Electrical stimulation performed: No Parameters: N/A Treatment response/outcome: Utilized skilled palpation to identify trigger points.  Able to palpate muscle twitch and elongation following.    PATIENT EDUCATION:  Education details: Access Code: [email or web address] Person educated: Patient Education method: Explanation, Demonstration, and Handouts Education comprehension: verbalized understanding and returned demonstration  HOME EXERCISE PROGRAM: Access Code: [email or web address] URL: [email or web address] Date: 12/25/2021 Prepared by: Claiborne Billings  Exercises - Seated Cervical Flexion AROM  - 3 x daily - 7 x weekly - 1 sets - 3 reps - 20 hold - Seated Cervical Sidebending AROM  - 3 x daily - 7 x weekly - 1 sets - 3 reps - 20 hold - Seated Cervical Rotation AROM  - 3 x daily - 7 x weekly - 1 sets - 3 reps - 20 hold - Seated Correct Posture  - 1 x daily - 7 x weekly - 3 sets - 10 reps - Supine Shoulder Flexion Extension AAROM with Dowel  - 3 x daily - 7 x weekly - 3 sets - 10 reps - Seated Shoulder Flexion AAROM with Dowel  - 3 x daily - 7 x weekly - 3 sets - 10 reps - Seated Shoulder Rolls  - 5 x daily - 7 x weekly - 1 sets - 10 reps   ASSESSMENT:  CLINICAL IMPRESSION: Ms Mccaskill presents to skilled PT with reporting overall less headaches than she had initially.  However, she is still experiencing cervical pain.  Pt was able to progress with increased strengthening, as she opted to have the dry needling next session instead of today.  Pt required increased cuing with new exercises for improved technique and posture.  Pt continues to require skilled PT to progress towards goal  related activities.  OBJECTIVE IMPAIRMENTS: decreased activity tolerance, decreased ROM, decreased strength, increased muscle spasms, impaired flexibility, postural dysfunction, and pain.   ACTIVITY LIMITATIONS: carrying, lifting, sitting, reach over head, and hygiene/grooming  PARTICIPATION LIMITATIONS: meal prep, cleaning, laundry, shopping, and community activity  PERSONAL FACTORS: Age and 1-2 comorbidities: chronic LBP, lumbar fusion, legally blind  are also affecting patient's functional outcome.  REHAB POTENTIAL: Good  CLINICAL DECISION MAKING: Stable/uncomplicated  EVALUATION COMPLEXITY: Low   GOALS: Goals reviewed with patient? Yes  SHORT TERM GOALS: Target date: 01/22/2022   Be independent in initial HEP Baseline:  Goal status: MET  2.  Report > or = to 25% reduction in the frequency and intensity of headaches with daily tasks  Baseline: 3-4/10 constant pain  Goal status: MET on 01/15/22 with pt reporting at least 24% less headaches.  3.  Reach overhead to a cabinet with 25% increased ease due to reduced neck pain Baseline: pain and inability to reach overhead Goal status: INITIAL   LONG TERM GOALS: Target date: 02/19/2022  Be independent in advanced HEP Baseline:  Goal status: IN PROGRESS  2.  Improve FOTO to > or = to 48  Baseline: 32 Goal status: INITIAL  3. Report > or = to 50% reduction in the frequency and intensity of headaches with daily tasks  Baseline: 3-4/10 constant  Goal status: INITIAL  4.  Demonstrate > or = to 65-70 degrees of cervical rotation A/ROM to improve safety in the community Baseline: 55 Goal status: INITIAL     PLAN:  PT FREQUENCY: 1-2x/week  PT DURATION: 8 weeks  PLANNED INTERVENTIONS: Therapeutic exercises, Therapeutic activity, Neuromuscular re-education, Balance training, Gait training, Patient/Family education, Self Care, Joint mobilization, Joint manipulation, Aquatic Therapy, Dry Needling, Cryotherapy, Moist  heat, Taping, Traction, Ultrasound, Manual therapy, and Re-evaluation  PLAN FOR NEXT SESSION: DN to neck, review HEP, begin postural strength, UE A/ROM   Juel Burrow, PT 01/15/22 10:59 AM   [facility] [address], [place], [place] [address] Phone # [phone] Fax [phone]

## 2022-01-17 ENCOUNTER — Ambulatory Visit: Payer: Medicare HMO

## 2022-01-17 DIAGNOSIS — M542 Cervicalgia: Secondary | ICD-10-CM

## 2022-01-17 DIAGNOSIS — R293 Abnormal posture: Secondary | ICD-10-CM

## 2022-01-17 DIAGNOSIS — M6281 Muscle weakness (generalized): Secondary | ICD-10-CM

## 2022-01-17 NOTE — Therapy (Signed)
OUTPATIENT PHYSICAL THERAPY TREATMENT NOTE   Patient Name: Emily Peck MRN: [id number] DOB:1955/05/01, 66 y.o., female Today's Date: 01/17/2022   PT End of Session - 01/17/22 1016     Visit Number 5    Date for PT Re-Evaluation 02/19/22    Authorization Type [employer]    Authorization Time Period 12/25/2021 - 02/19/2022    Authorization - Visit Number 5    Authorization - Number of Visits 16    Progress Note Due on Visit 10    PT Start Time 0931    PT Stop Time 1014    PT Time Calculation (min) 43 min    Activity Tolerance Patient tolerated treatment well    Behavior During Therapy WFL for tasks assessed/performed              Past Medical History:  Diagnosis Date   Anxiety    Clotting disorder ([place])    Depression    Dyspnea    GERD (gastroesophageal reflux disease)    Headache    Lumbosacral spondylosis without myelopathy    Neuropathy    Postlaminectomy syndrome, lumbar region    Postlaminectomy syndrome, thoracic region    Stroke [facility])    Past Surgical History:  Procedure Laterality Date   ABDOMINAL HYSTERECTOMY     BACK SURGERY     BIOPSY  10/02/2020   Procedure: BIOPSY;  Surgeon: Juanita Craver, MD;  Location: [place];  Service: Endoscopy;;   COLONOSCOPY WITH PROPOFOL N/A 10/02/2020   Procedure: COLONOSCOPY WITH PROPOFOL;  Surgeon: Juanita Craver, MD;  Location: [facility] ENDOSCOPY;  Service: Endoscopy;  Laterality: N/A;   ESOPHAGOGASTRODUODENOSCOPY (EGD) WITH PROPOFOL N/A 10/02/2020   Procedure: ESOPHAGOGASTRODUODENOSCOPY (EGD) WITH PROPOFOL;  Surgeon: Juanita Craver, MD;  Location: [facility] ENDOSCOPY;  Service: Endoscopy;  Laterality: N/A;   EYE SURGERY     eye removed right    GIVENS CAPSULE STUDY N/A 10/02/2020   Procedure: GIVENS CAPSULE STUDY;  Surgeon: Juanita Craver, MD;  Location: [facility] ENDOSCOPY;  Service: Endoscopy;  Laterality: N/A;   KNEE ARTHROSCOPY     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   Procedure: TOTAL KNEE ARTHROPLASTY;  Surgeon: Melrose Nakayama, MD;   Location: [place];  Service: Orthopedics;  Laterality: Right;   Patient Active Problem List   Diagnosis Date Noted   Iron deficiency anemia 10/23/2020   Symptomatic anemia 09/30/2020   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   Category 4 blindness of left eye 04/19/2019   Nocturia more than twice per night 04/19/2019   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   Nasal septal deviation 04/19/2019   Excessive daytime sleepiness 04/19/2019   Neuropathy 04/19/2019   Sleep-related headache 04/19/2019   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[place]) 04/19/2019   Rotator cuff syndrome of both shoulders 06/09/2018   Cervicalgia 03/19/2018   Disorder of rotator cuff syndrome of right shoulder and allied disorder 01/09/2018   Primary osteoarthritis of right knee 01/09/2016   Peripheral polyneuropathy 10/19/2015   Knee pain, chronic 06/11/2013   Thoracic postlaminectomy syndrome 06/11/2013   Pes planus of both feet 06/11/2013   Trochanteric bursitis of both hips 11/06/2012   Lumbosacral spondylosis without myelopathy 05/09/2011   DEPRESSION 07/24/2006   CARPAL TUNNEL SYNDROME, RIGHT 07/24/2006   MYOPIA, 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   DEGENERATIVE JOINT DISEASE 07/24/2006   DISORDER, LUMBAR DISC W/MYELOPATHY 07/24/2006   Cerebral venous sinus thrombosis 07/24/2006   AVULSION, EYE 12/23/2005    PCP: Darrol Jump, NP  REFERRING PROVIDER: Cheryll Cockayne, MD  REFERRING DIAG:  M79.18 (ICD-10-CM) -  Cervical myofascial pain syndrome  M54.2 (ICD-10-CM) - Cervicalgia  M54.81 (ICD-10-CM) - Bilateral occipital neuralgia    THERAPY DIAG:  Cervicalgia  Abnormal posture  Muscle weakness (generalized)  Rationale for Evaluation and Treatment Rehabilitation  ONSET DATE: 2022  SUBJECTIVE:                                                                                                                                                                                                          SUBJECTIVE STATEMENT: No neck pain today.  My reflux is bothering me today.    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  Requires SBA for mobility  WEIGHT BEARING RESTRICTIONS: No  FALLS:  Has patient fallen in last 6 months? Yes. Number of falls 1 PT has addressed this in the recent past   LIVING ENVIRONMENT: Lives with: lives with their family Lives in: House/apartment Stairs: No Has following equipment at home: Single point cane  OCCUPATION: NA  PLOF: Independent with household mobility with device, Requires assistive device for independence, and Needs assistance with ADLs  PATIENT GOALS: reduce pain, reduce headaches   OBJECTIVE:   DIAGNOSTIC FINDINGS:  None of cervical spine.  Scheduled for 01/02/22  PATIENT SURVEYS:  12/25/2021:  FOTO 32 (48 is goal)  COGNITION: Overall cognitive status: Within functional limits for tasks assessed  SENSATION: WFL  POSTURE: rounded shoulders, forward head, and flexed trunk   PALPATION: Diffuse palpable tenderness over bil neck, upper trap and suboccipitals.  Significant trigger points in bil upper traps and suboccipitals.  Reduced PA mobility in the cervical and thoracic spine,   CERVICAL ROM:   Active ROM A/PROM (deg) eval A/ROM 11/  Flexion 55   Extension 25   Right lateral flexion 30   Left lateral flexion 25   Right rotation 55 65  Left rotation 55 65   (Blank rows = not tested)  UPPER EXTREMITY ROM: 12/25/2021:  Limited bilaterally by 25% with pain in bil shoulders   UPPER EXTREMITY MMT: 12/25/2021:  Pain with this.  4-/5 throughout   TODAY'S TREATMENT: DATE  01/17/2022: UBE level 1.0 x3 min each direction with PT present to discuss status Seated cervical rotation 3x20 seconds each Sit to/from stand holding 5# kettlebell:  x10 with  chest press, x10 with overhead press Seated shoulder flexion with 2# on cane 2x10  Trigger Point Dry-Needling  Treatment instructions: Expect mild to moderate muscle soreness. S/S of pneumothorax if dry needled over a lung field, and to seek immediate medical attention should they occur. Patient verbalized understanding of these instructions and education. Patient Consent Given: Yes Education handout provided: Yes Muscles treated: bilateral suboccipital, bilateral cervical multifidi, bil upper trap Electrical stimulation performed: No Parameters: N/A Treatment response/outcome: Utilized skilled palpation to identify trigger points.  Able to palpate muscle twitch and elongation following.  DATE 01/15/2022: UBE level 1.0 x3 min each direction with PT present to discuss status Seated cervical rotation and extension 2x10 Seated chin tuck 2x10 Sit to/from stand holding 5# kettlebell:  x10 with chest press, x10 with overhead press Seated shoulder flexion with 1# on cane 2x10 Shoulder ER and horizontal abduction with red tband 2x10 each Standing shoulder row and shoulder extension with red tband 2x10 each Seated serratus shoulder flexion with green loop 2x10 (pt requires max cuing for technique and posture) DATE 01/08/2022: UBE level 1.0 x3 min each direction with PT present to discuss status Seated cervical rotation and extension 2x10 Seated chin tuck 2x10 Seated shoulder flexion with 1# on cane 2x10 Shoulder ER and horizontal abduction with red tband 2x10 each Standing shoulder row with red tband 2x10 Trigger Point Dry-Needling  Treatment instructions: Expect mild to moderate muscle soreness. S/S of pneumothorax if dry needled over a lung field, and to seek immediate medical attention should they occur. Patient  verbalized understanding of these instructions and education. Patient Consent Given: Yes Education handout provided: Yes Muscles treated: bilateral suboccipital, bilateral cervical multifidi, right upper trap Electrical stimulation performed: No Parameters: N/A Treatment response/outcome: Utilized skilled palpation to identify trigger points.  Able to palpate muscle twitch and elongation following.  PATIENT EDUCATION:  Education details: Access Code: [email or web address] Person educated: Patient Education method: Explanation, Demonstration, and Handouts Education comprehension: verbalized understanding and returned demonstration  HOME EXERCISE PROGRAM: Access Code: [email or web address] URL: [email or web address] Date: 12/25/2021 Prepared by: Claiborne Billings  Exercises - Seated Cervical Flexion AROM  - 3 x daily - 7 x weekly - 1 sets - 3 reps - 20 hold - Seated Cervical Sidebending AROM  - 3 x daily - 7 x weekly - 1 sets - 3 reps - 20 hold - Seated Cervical Rotation AROM  - 3 x daily - 7 x weekly - 1 sets - 3 reps - 20 hold - Seated Correct Posture  - 1 x daily - 7 x weekly - 3 sets - 10 reps - Supine Shoulder Flexion Extension AAROM with Dowel  - 3 x daily - 7 x weekly - 3 sets - 10 reps - Seated Shoulder Flexion AAROM with Dowel  - 3 x daily - 7 x weekly - 3 sets - 10 reps - Seated Shoulder Rolls  - 5 x daily - 7 x weekly - 1 sets - 10 reps   ASSESSMENT:  CLINICAL IMPRESSION: Pt reports 25% reduction in headaches since the start of care.  Neck pain is 3/10 today and cervical A/ROM is improved by 10 degrees bilaterally today. Pt with tension and trigger points in bil neck with good twitch release with DN and improved tissue mobility after DN today.  Pt required intermittent cueing for alignment and technique with exercises in the clinic today.   Pt continues to require skilled PT to progress towards goal related activities.   OBJECTIVE IMPAIRMENTS: decreased activity tolerance, decreased ROM,  decreased strength, increased muscle spasms, impaired flexibility, postural dysfunction, and pain.   ACTIVITY LIMITATIONS: carrying, lifting, sitting, reach over head, and hygiene/grooming  PARTICIPATION LIMITATIONS: meal prep, cleaning, laundry, shopping, and community activity  PERSONAL FACTORS: Age and 1-2 comorbidities: chronic LBP, lumbar fusion, legally blind  are also affecting patient's functional outcome.  REHAB POTENTIAL: Good  CLINICAL DECISION MAKING: Stable/uncomplicated  EVALUATION COMPLEXITY: Low   GOALS: Goals reviewed with patient? Yes  SHORT TERM GOALS: Target date: 01/22/2022   Be independent in initial HEP Baseline:  Goal status: MET  2.  Report > or = to 25% reduction in the frequency and intensity of headaches with daily tasks  Baseline: 3-4/10 constant pain  Goal status: MET on 01/15/22 with pt reporting at least 24% less headaches.  3.  Reach overhead to a cabinet with 25% increased ease due to reduced neck pain Baseline: pain and inability to reach overhead Goal status: INITIAL   LONG TERM GOALS: Target date: 02/19/2022  Be independent in advanced HEP Baseline:  Goal status: IN PROGRESS  2.  Improve FOTO to > or = to 48  Baseline: 32 Goal status: INITIAL  3. Report > or = to 50% reduction in the frequency and intensity of headaches with daily tasks  Baseline: 25% (01/17/22) Goal status: in progress   4.  Demonstrate > or = to 65-70 degrees of cervical rotation A/ROM to improve safety in the community Baseline: 65 Rt and Lt (01/17/22) Goal status: INITIAL     PLAN:  PT FREQUENCY: 1-2x/week  PT DURATION: 8 weeks  PLANNED INTERVENTIONS: Therapeutic exercises, Therapeutic activity, Neuromuscular re-education, Balance training, Gait training, Patient/Family education, Self Care, Joint mobilization, Joint manipulation, Aquatic Therapy, Dry Needling, Cryotherapy, Moist heat, Taping, Traction, Ultrasound, Manual therapy, and  Re-evaluation  PLAN FOR NEXT SESSION: postural strength, cervical/thoracic mobility, manual therapy as needed.    Sigurd Sos, PT 01/17/22 10:18 AM   [facility] [address] [place], [place] [address] Phone # [phone] Fax [phone]

## 2022-01-22 ENCOUNTER — Encounter: Payer: Medicare HMO | Admitting: Rehabilitative and Restorative Service Providers"

## 2022-01-29 ENCOUNTER — Encounter: Payer: Self-pay | Admitting: Rehabilitative and Restorative Service Providers"

## 2022-01-29 ENCOUNTER — Ambulatory Visit: Payer: Medicare HMO | Admitting: Rehabilitative and Restorative Service Providers"

## 2022-01-29 DIAGNOSIS — M542 Cervicalgia: Secondary | ICD-10-CM | POA: Diagnosis not present

## 2022-01-29 DIAGNOSIS — R293 Abnormal posture: Secondary | ICD-10-CM

## 2022-01-29 DIAGNOSIS — M6281 Muscle weakness (generalized): Secondary | ICD-10-CM

## 2022-01-29 NOTE — Therapy (Signed)
OUTPATIENT PHYSICAL THERAPY TREATMENT NOTE   Patient Name: Emily Peck MRN: [id number] DOB:[birth year], 66 y.o., female Today's Date: 01/29/2022   PT End of Session - 01/29/22 0951     Visit Number 6    Date for PT Re-Evaluation 02/19/22    Authorization Type [employer]    Authorization Time Period 12/25/2021 - 02/19/2022    Authorization - Visit Number 6    Authorization - Number of Visits 16    Progress Note Due on Visit 10    PT Start Time 0946    PT Stop Time 1025    PT Time Calculation (min) 39 min    Activity Tolerance Patient tolerated treatment well    Behavior During Therapy WFL for tasks assessed/performed              Past Medical History:  Diagnosis Date   Anxiety    Clotting disorder ([place])    Depression    Dyspnea    GERD (gastroesophageal reflux disease)    Headache    Lumbosacral spondylosis without myelopathy    Neuropathy    Postlaminectomy syndrome, lumbar region    Postlaminectomy syndrome, thoracic region    Stroke [facility])    Past Surgical History:  Procedure Laterality Date   ABDOMINAL HYSTERECTOMY     BACK SURGERY     BIOPSY  10/02/2020   Procedure: BIOPSY;  Surgeon: Juanita Craver, MD;  Location: [place];  Service: Endoscopy;;   COLONOSCOPY WITH PROPOFOL N/A 10/02/2020   Procedure: COLONOSCOPY WITH PROPOFOL;  Surgeon: Juanita Craver, MD;  Location: [facility] ENDOSCOPY;  Service: Endoscopy;  Laterality: N/A;   ESOPHAGOGASTRODUODENOSCOPY (EGD) WITH PROPOFOL N/A 10/02/2020   Procedure: ESOPHAGOGASTRODUODENOSCOPY (EGD) WITH PROPOFOL;  Surgeon: Juanita Craver, MD;  Location: [facility] ENDOSCOPY;  Service: Endoscopy;  Laterality: N/A;   EYE SURGERY     eye removed right    GIVENS CAPSULE STUDY N/A 10/02/2020   Procedure: GIVENS CAPSULE STUDY;  Surgeon: Juanita Craver, MD;  Location: [facility] ENDOSCOPY;  Service: Endoscopy;  Laterality: N/A;   KNEE ARTHROSCOPY     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   Procedure: TOTAL KNEE ARTHROPLASTY;  Surgeon: Melrose Nakayama, MD;   Location: [place];  Service: Orthopedics;  Laterality: Right;   Patient Active Problem List   Diagnosis Date Noted   Iron deficiency anemia 10/23/2020   Symptomatic anemia 09/30/2020   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   Category 4 blindness of left eye 04/19/2019   Nocturia more than twice per night 04/19/2019   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   Nasal septal deviation 04/19/2019   Excessive daytime sleepiness 04/19/2019   Neuropathy 04/19/2019   Sleep-related headache 04/19/2019   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[place]) 04/19/2019   Rotator cuff syndrome of both shoulders 06/09/2018   Cervicalgia 03/19/2018   Disorder of rotator cuff syndrome of right shoulder and allied disorder 01/09/2018   Primary osteoarthritis of right knee 01/09/2016   Peripheral polyneuropathy 10/19/2015   Knee pain, chronic 06/11/2013   Thoracic postlaminectomy syndrome 06/11/2013   Pes planus of both feet 06/11/2013   Trochanteric bursitis of both hips 11/06/2012   Lumbosacral spondylosis without myelopathy 05/09/2011   DEPRESSION 07/24/2006   CARPAL TUNNEL SYNDROME, RIGHT 07/24/2006   MYOPIA, 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   DEGENERATIVE JOINT DISEASE 07/24/2006   DISORDER, LUMBAR DISC W/MYELOPATHY 07/24/2006   Cerebral venous sinus thrombosis 07/24/2006   AVULSION, EYE 12/23/2005    PCP: Darrol Jump, NP  REFERRING PROVIDER: Cheryll Cockayne, MD  REFERRING DIAG:  M79.18 (ICD-10-CM) -  Cervical myofascial pain syndrome  M54.2 (ICD-10-CM) - Cervicalgia  M54.81 (ICD-10-CM) - Bilateral occipital neuralgia    THERAPY DIAG:  Cervicalgia  Abnormal posture  Muscle weakness (generalized)  Rationale for Evaluation and Treatment Rehabilitation  ONSET DATE: 2022  SUBJECTIVE:                                                                                                                                                                                                          SUBJECTIVE STATEMENT: Pt reports that she had a headache yesterday, but is feeling better today.  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  Requires SBA for mobility  WEIGHT BEARING RESTRICTIONS: No  FALLS:  Has patient fallen in last 6 months? Yes. Number of falls 1 PT has addressed this in the recent past   LIVING ENVIRONMENT: Lives with: lives with their family Lives in: House/apartment Stairs: No Has following equipment at home: Single point cane  OCCUPATION: NA  PLOF: Independent with household mobility with device, Requires assistive device for independence, and Needs assistance with ADLs  PATIENT GOALS: reduce pain, reduce headaches   OBJECTIVE:   DIAGNOSTIC FINDINGS:  None of cervical spine.  Scheduled for 01/02/22  PATIENT SURVEYS:  12/25/2021:  FOTO 32 (48 is goal) 01/29/2022:  FOTO 53%  COGNITION: Overall cognitive status: Within functional limits for tasks assessed  SENSATION: WFL  POSTURE: rounded shoulders, forward head, and flexed trunk   PALPATION: Diffuse palpable tenderness over bil neck, upper trap and suboccipitals.  Significant trigger points in bil upper traps and suboccipitals.  Reduced PA mobility in the cervical and thoracic spine,   CERVICAL ROM:   Active ROM A/PROM (deg) eval A/ROM 11/  Flexion 55   Extension 25   Right lateral flexion 30   Left lateral flexion 25   Right rotation 55 65  Left rotation 55 65   (Blank rows = not tested)  UPPER EXTREMITY ROM: 12/25/2021:  Limited bilaterally by 25% with pain in bil shoulders   UPPER EXTREMITY MMT: 12/25/2021:  Pain with this.  4-/5  throughout   TODAY'S TREATMENT:  DATE 01/17/2022: UBE level 1.2 x4 min each direction with PT present to discuss status Seated cervical rotation 3x20 seconds each Upper trap stretch 2x20  sec bilat (with cuing to avoid excessive pull/stretch) Seated chin tuck 2x10 Sit to/from stand holding 5# kettlebell:  x10 with chest press, x10 with overhead press Seated shoulder flexion with 2# on cane 2x10 Shoulder ER and horizontal abduction with red tband 2x10 each (with cuing for improved posture) Cervical FOTO 53% Seated serratus shoulder flexion with green loop 2x10 (pt requires max cuing for technique and posture) Shoulder flexion with tapping 1# dumbbell to middle shelf 2x10 bilat   DATE 01/17/2022: UBE level 1.0 x3 min each direction with PT present to discuss status Seated cervical rotation 3x20 seconds each Sit to/from stand holding 5# kettlebell:  x10 with chest press, x10 with overhead press Seated shoulder flexion with 2# on cane 2x10  Trigger Point Dry-Needling  Treatment instructions: Expect mild to moderate muscle soreness. S/S of pneumothorax if dry needled over a lung field, and to seek immediate medical attention should they occur. Patient verbalized understanding of these instructions and education. Patient Consent Given: Yes Education handout provided: Yes Muscles treated: bilateral suboccipital, bilateral cervical multifidi, bil upper trap Electrical stimulation performed: No Parameters: N/A Treatment response/outcome: Utilized skilled palpation to identify trigger points.  Able to palpate muscle twitch and elongation following.  DATE 01/15/2022: UBE level 1.0 x3 min each direction with PT present to discuss status Seated cervical rotation and extension 2x10 Seated chin tuck 2x10 Sit to/from stand holding 5# kettlebell:  x10 with chest press, x10 with overhead press Seated shoulder flexion with 1# on cane 2x10 Shoulder ER and horizontal abduction with red tband 2x10  each Standing shoulder row and shoulder extension with red tband 2x10 each Seated serratus shoulder flexion with green loop 2x10 (pt requires max cuing for technique and posture)   PATIENT EDUCATION:  Education details: Access Code: [email or web address] Person educated: Patient Education method: Explanation, Demonstration, and Handouts Education comprehension: verbalized understanding and returned demonstration  HOME EXERCISE PROGRAM: Access Code: [email or web address] URL: [email or web address] Date: 12/25/2021 Prepared by: Claiborne Billings  Exercises - Seated Cervical Flexion AROM  - 3 x daily - 7 x weekly - 1 sets - 3 reps - 20 hold - Seated Cervical Sidebending AROM  - 3 x daily - 7 x weekly - 1 sets - 3 reps - 20 hold - Seated Cervical Rotation AROM  - 3 x daily - 7 x weekly - 1 sets - 3 reps - 20 hold - Seated Correct Posture  - 1 x daily - 7 x weekly - 3 sets - 10 reps - Supine Shoulder Flexion Extension AAROM with Dowel  - 3 x daily - 7 x weekly - 3 sets - 10 reps - Seated Shoulder Flexion AAROM with Dowel  - 3 x daily - 7 x weekly - 3 sets - 10 reps - Seated Shoulder Rolls  - 5 x daily - 7 x weekly - 1 sets - 10 reps   ASSESSMENT:  CLINICAL IMPRESSION: Ms Popko presents to skilled PT with reports of continued progress.  Does state that she had a headache yesterday, but that it is gone today.  Pt states only 2/10 cervical pain today.  Improved FOTO score noted on today's date and all short-term goals were met with progress towards long-term goals.  Pt continues to progress towards improved posture, but continues to require cuing occasionally during exercises.   OBJECTIVE IMPAIRMENTS: decreased activity tolerance, decreased ROM, decreased strength, increased muscle spasms, impaired flexibility, postural dysfunction, and pain.   ACTIVITY LIMITATIONS: carrying, lifting, sitting, reach over head, and hygiene/grooming  PARTICIPATION LIMITATIONS: meal  prep, cleaning, laundry, shopping, and  community activity  PERSONAL FACTORS: Age and 1-2 comorbidities: chronic LBP, lumbar fusion, legally blind  are also affecting patient's functional outcome.  REHAB POTENTIAL: Good  CLINICAL DECISION MAKING: Stable/uncomplicated  EVALUATION COMPLEXITY: Low   GOALS: Goals reviewed with patient? Yes  SHORT TERM GOALS: Target date: 01/22/2022   Be independent in initial HEP Baseline:  Goal status: MET  2.  Report > or = to 25% reduction in the frequency and intensity of headaches with daily tasks  Baseline: 3-4/10 constant pain  Goal status: MET on 01/15/22 with pt reporting at least 24% less headaches.  3.  Reach overhead to a cabinet with 25% increased ease due to reduced neck pain Baseline: pain and inability to reach overhead Goal status: MET on 01/29/22   LONG TERM GOALS: Target date: 02/19/2022  Be independent in advanced HEP Baseline:  Goal status: IN PROGRESS  2.  Improve FOTO to > or = to 48  Baseline: 32 Goal status: MET on 01/29/2022  3. Report > or = to 50% reduction in the frequency and intensity of headaches with daily tasks  Baseline: 25% (01/17/22) Goal status: in progress   4.  Demonstrate > or = to 65-70 degrees of cervical rotation A/ROM to improve safety in the community Baseline: 65 Rt and Lt (01/17/22) Goal status: IN PROGRESS     PLAN:  PT FREQUENCY: 1-2x/week  PT DURATION: 8 weeks  PLANNED INTERVENTIONS: Therapeutic exercises, Therapeutic activity, Neuromuscular re-education, Balance training, Gait training, Patient/Family education, Self Care, Joint mobilization, Joint manipulation, Aquatic Therapy, Dry Needling, Cryotherapy, Moist heat, Taping, Traction, Ultrasound, Manual therapy, and Re-evaluation  PLAN FOR NEXT SESSION: postural strength, cervical/thoracic mobility, manual therapy as needed.    Juel Burrow, PT 01/29/22 10:38 AM   [facility] [address] [place], [place] [address] Phone  # [phone] Fax [phone]

## 2022-01-31 ENCOUNTER — Ambulatory Visit: Payer: Medicare HMO

## 2022-01-31 DIAGNOSIS — R293 Abnormal posture: Secondary | ICD-10-CM

## 2022-01-31 DIAGNOSIS — M542 Cervicalgia: Secondary | ICD-10-CM

## 2022-01-31 DIAGNOSIS — M6281 Muscle weakness (generalized): Secondary | ICD-10-CM

## 2022-01-31 NOTE — Therapy (Signed)
OUTPATIENT PHYSICAL THERAPY TREATMENT NOTE   Patient Name: Emily Peck MRN: [id number] DOB:[birth year], 66 y.o., female Today's Date: 01/31/2022   PT End of Session - 01/31/22 1012     Visit Number 7    Date for PT Re-Evaluation 02/19/22    Authorization Type [employer]    Authorization Time Period 12/25/2021 - 02/19/2022    Authorization - Visit Number 7    Authorization - Number of Visits 16    Progress Note Due on Visit 10    PT Start Time 0915    PT Stop Time 1001    PT Time Calculation (min) 46 min    Activity Tolerance Patient tolerated treatment well    Behavior During Therapy WFL for tasks assessed/performed               Past Medical History:  Diagnosis Date   Anxiety    Clotting disorder ([place])    Depression    Dyspnea    GERD (gastroesophageal reflux disease)    Headache    Lumbosacral spondylosis without myelopathy    Neuropathy    Postlaminectomy syndrome, lumbar region    Postlaminectomy syndrome, thoracic region    Stroke [facility])    Past Surgical History:  Procedure Laterality Date   ABDOMINAL HYSTERECTOMY     BACK SURGERY     BIOPSY  10/02/2020   Procedure: BIOPSY;  Surgeon: Juanita Craver, MD;  Location: [place];  Service: Endoscopy;;   COLONOSCOPY WITH PROPOFOL N/A 10/02/2020   Procedure: COLONOSCOPY WITH PROPOFOL;  Surgeon: Juanita Craver, MD;  Location: [facility] ENDOSCOPY;  Service: Endoscopy;  Laterality: N/A;   ESOPHAGOGASTRODUODENOSCOPY (EGD) WITH PROPOFOL N/A 10/02/2020   Procedure: ESOPHAGOGASTRODUODENOSCOPY (EGD) WITH PROPOFOL;  Surgeon: Juanita Craver, MD;  Location: [facility] ENDOSCOPY;  Service: Endoscopy;  Laterality: N/A;   EYE SURGERY     eye removed right    GIVENS CAPSULE STUDY N/A 10/02/2020   Procedure: GIVENS CAPSULE STUDY;  Surgeon: Juanita Craver, MD;  Location: [facility] ENDOSCOPY;  Service: Endoscopy;  Laterality: N/A;   KNEE ARTHROSCOPY     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   Procedure: TOTAL KNEE ARTHROPLASTY;  Surgeon: Melrose Nakayama, MD;   Location: [place];  Service: Orthopedics;  Laterality: Right;   Patient Active Problem List   Diagnosis Date Noted   Iron deficiency anemia 10/23/2020   Symptomatic anemia 09/30/2020   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   Category 4 blindness of left eye 04/19/2019   Nocturia more than twice per night 04/19/2019   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   Nasal septal deviation 04/19/2019   Excessive daytime sleepiness 04/19/2019   Neuropathy 04/19/2019   Sleep-related headache 04/19/2019   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[place]) 04/19/2019   Rotator cuff syndrome of both shoulders 06/09/2018   Cervicalgia 03/19/2018   Disorder of rotator cuff syndrome of right shoulder and allied disorder 01/09/2018   Primary osteoarthritis of right knee 01/09/2016   Peripheral polyneuropathy 10/19/2015   Knee pain, chronic 06/11/2013   Thoracic postlaminectomy syndrome 06/11/2013   Pes planus of both feet 06/11/2013   Trochanteric bursitis of both hips 11/06/2012   Lumbosacral spondylosis without myelopathy 05/09/2011   DEPRESSION 07/24/2006   CARPAL TUNNEL SYNDROME, RIGHT 07/24/2006   MYOPIA, 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   DEGENERATIVE JOINT DISEASE 07/24/2006   DISORDER, LUMBAR DISC W/MYELOPATHY 07/24/2006   Cerebral venous sinus thrombosis 07/24/2006   AVULSION, EYE 12/23/2005    PCP: Darrol Jump, NP  REFERRING PROVIDER: Cheryll Cockayne, MD  REFERRING DIAG:  M79.18 (ICD-10-CM) -  Cervical myofascial pain syndrome  M54.2 (ICD-10-CM) - Cervicalgia  M54.81 (ICD-10-CM) - Bilateral occipital neuralgia    THERAPY DIAG:  Cervicalgia  Abnormal posture  Muscle weakness (generalized)  Rationale for Evaluation and Treatment Rehabilitation  ONSET DATE: 2022  SUBJECTIVE:                                                                                                                                                                                                          SUBJECTIVE STATEMENT: My headaches are 75% better.  My neck is also feeling better.    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  Requires SBA for mobility  WEIGHT BEARING RESTRICTIONS: No  FALLS:  Has patient fallen in last 6 months? Yes. Number of falls 1 PT has addressed this in the recent past   LIVING ENVIRONMENT: Lives with: lives with their family Lives in: House/apartment Stairs: No Has following equipment at home: Single point cane  OCCUPATION: NA  PLOF: Independent with household mobility with device, Requires assistive device for independence, and Needs assistance with ADLs  PATIENT GOALS: reduce pain, reduce headaches   OBJECTIVE:   DIAGNOSTIC FINDINGS:  None of cervical spine.  Scheduled for 01/02/22  PATIENT SURVEYS:  12/25/2021:  FOTO 32 (48 is goal) 01/29/2022:  FOTO 53%  COGNITION: Overall cognitive status: Within functional limits for tasks assessed  SENSATION: WFL  POSTURE: rounded shoulders, forward head, and flexed trunk   PALPATION: Diffuse palpable tenderness over bil neck, upper trap and suboccipitals.  Significant trigger points in bil upper traps and suboccipitals.  Reduced PA mobility in the cervical and thoracic spine,   CERVICAL ROM:   Active ROM A/PROM (deg) eval A/ROM 11/  Flexion 55   Extension 25   Right lateral flexion 30   Left lateral flexion 25   Right rotation 55 65  Left rotation 55 65   (Blank rows = not tested)  UPPER EXTREMITY ROM: 12/25/2021:  Limited bilaterally by 25% with pain in bil shoulders   UPPER EXTREMITY MMT: 12/25/2021:  Pain with this.  4-/5  throughout   TODAY'S TREATMENT: DATE 01/31/2022: UBE level 1.2 x4 min each direction with PT present to discuss status Seated cervical rotation 3x20 seconds each Upper trap stretch 2x20  sec bilat (with cuing to avoid excessive pull/stretch) Seated chin tuck 2x10 Sit to/from stand holding 5# kettlebell:  x10 with chest press, x10 with overhead press Seated shoulder flexion with 2# on cane 2x10 Shoulder ER and horizontal abduction with red tband 2x10 each (with cuing for improved posture) Manual: elongation and release to bil upper traps and cervical paraspinals   DATE 01/17/2022: UBE level 1.2 x4 min each direction with PT present to discuss status Seated cervical rotation 3x20 seconds each Upper trap stretch 2x20 sec bilat (with cuing to avoid excessive pull/stretch) Seated chin tuck 2x10 Sit to/from stand holding 5# kettlebell:  x10 with chest press, x10 with overhead press Seated shoulder flexion with 2# on cane 2x10 Shoulder ER and horizontal abduction with red tband 2x10 each (with cuing for improved posture) Cervical FOTO 53% Seated serratus shoulder flexion with green loop 2x10 (pt requires max cuing for technique and posture) Shoulder flexion with tapping 1# dumbbell to middle shelf 2x10 bilat   DATE 01/17/2022: UBE level 1.0 x3 min each direction with PT present to discuss status Seated cervical rotation 3x20 seconds each Sit to/from stand holding 5# kettlebell:  x10 with chest press, x10 with overhead press Seated shoulder flexion with 2# on cane 2x10  Trigger Point Dry-Needling  Treatment instructions: Expect mild to moderate muscle soreness. S/S of pneumothorax if dry needled over a lung field, and to seek immediate medical attention should they occur. Patient verbalized understanding of these instructions and education. Patient Consent Given: Yes Education handout provided: Yes Muscles treated: bilateral suboccipital, bilateral cervical multifidi, bil upper  trap Electrical stimulation performed: No Parameters: N/A Treatment response/outcome: Utilized skilled palpation to identify trigger points.  Able to palpate muscle twitch and elongation following.  PATIENT EDUCATION:  Education details: Access Code: [email or web address] Person educated: Patient Education method: Explanation, Demonstration, and Handouts Education comprehension: verbalized understanding and returned demonstration  HOME EXERCISE PROGRAM: Access Code: [email or web address] URL: [email or web address] Date: 12/25/2021 Prepared by: Claiborne Billings  Exercises - Seated Cervical Flexion AROM  - 3 x daily - 7 x weekly - 1 sets - 3 reps - 20 hold - Seated Cervical Sidebending AROM  - 3 x daily - 7 x weekly - 1 sets - 3 reps - 20 hold - Seated Cervical Rotation AROM  - 3 x daily - 7 x weekly - 1 sets - 3 reps - 20 hold - Seated Correct Posture  - 1 x daily - 7 x weekly - 3 sets - 10 reps - Supine Shoulder Flexion Extension AAROM with Dowel  - 3 x daily - 7 x weekly - 3 sets - 10 reps - Seated Shoulder Flexion AAROM with Dowel  - 3 x daily - 7 x weekly - 3 sets - 10 reps - Seated Shoulder Rolls  - 5 x daily - 7 x weekly - 1 sets - 10 reps   ASSESSMENT:  CLINICAL IMPRESSION: Pt reports 75% reduction in headaches since the start of care.  FOTO was improved last session, meeting the goal. Pt arrived with no pain today and did well with all exercises in the clinic.  Manual therapy replaced DN today due to no pain. Pt with tension in bil upper traps and neck and pt had reduced tension after manual therapy. Pt continues to progress towards improved posture, but continues to require cuing occasionally during exercises.     OBJECTIVE IMPAIRMENTS: decreased activity tolerance, decreased ROM, decreased strength, increased muscle spasms, impaired flexibility, postural dysfunction, and pain.   ACTIVITY LIMITATIONS: carrying,  lifting, sitting, reach over head, and hygiene/grooming  PARTICIPATION LIMITATIONS:  meal prep, cleaning, laundry, shopping, and community activity  PERSONAL FACTORS: Age and 1-2 comorbidities: chronic LBP, lumbar fusion, legally blind  are also affecting patient's functional outcome.  REHAB POTENTIAL: Good  CLINICAL DECISION MAKING: Stable/uncomplicated  EVALUATION COMPLEXITY: Low   GOALS: Goals reviewed with patient? Yes  SHORT TERM GOALS: Target date: 01/22/2022   Be independent in initial HEP Baseline:  Goal status: MET  2.  Report > or = to 25% reduction in the frequency and intensity of headaches with daily tasks  Baseline: 3-4/10 constant pain  Goal status: MET on 01/15/22 with pt reporting at least 24% less headaches.  3.  Reach overhead to a cabinet with 25% increased ease due to reduced neck pain Baseline: pain and inability to reach overhead Goal status: MET on 01/29/22   LONG TERM GOALS: Target date: 02/19/2022  Be independent in advanced HEP Baseline:  Goal status: IN PROGRESS  2.  Improve FOTO to > or = to 48  Baseline: 32 Goal status: MET on 01/29/2022  3. Report > or = to 50% reduction in the frequency and intensity of headaches with daily tasks  Baseline: 25% (01/17/22) Goal status: in progress   4.  Demonstrate > or = to 65-70 degrees of cervical rotation A/ROM to improve safety in the community Baseline: 65 Rt and Lt (01/17/22) Goal status: IN PROGRESS     PLAN:  PT FREQUENCY: 1-2x/week  PT DURATION: 8 weeks  PLANNED INTERVENTIONS: Therapeutic exercises, Therapeutic activity, Neuromuscular re-education, Balance training, Gait training, Patient/Family education, Self Care, Joint mobilization, Joint manipulation, Aquatic Therapy, Dry Needling, Cryotherapy, Moist heat, Taping, Traction, Ultrasound, Manual therapy, and Re-evaluation  PLAN FOR NEXT SESSION: postural strength, cervical/thoracic mobility, manual therapy as needed.    Sigurd Sos, PT 01/31/22 10:13 AM   [facility] [address], [place] [address] [place], [place] [address] Phone # [phone] Fax [phone]

## 2022-02-05 ENCOUNTER — Ambulatory Visit: Payer: Medicare HMO | Attending: Neurology | Admitting: Rehabilitative and Restorative Service Providers"

## 2022-02-05 ENCOUNTER — Encounter: Payer: Self-pay | Admitting: Rehabilitative and Restorative Service Providers"

## 2022-02-05 DIAGNOSIS — M542 Cervicalgia: Secondary | ICD-10-CM

## 2022-02-05 DIAGNOSIS — M6281 Muscle weakness (generalized): Secondary | ICD-10-CM

## 2022-02-05 DIAGNOSIS — R293 Abnormal posture: Secondary | ICD-10-CM | POA: Diagnosis present

## 2022-02-05 NOTE — Therapy (Signed)
OUTPATIENT PHYSICAL THERAPY TREATMENT NOTE   Patient Name: Emily Peck MRN: [id number] DOB:[birth year], 66 y.o., female Today's Date: 02/05/2022   PT End of Session - 02/05/22 0945     Visit Number 8    Date for PT Re-Evaluation 02/19/22    Authorization Type [employer]    Authorization Time Period 12/25/2021 - 02/19/2022    Authorization - Visit Number 8    Authorization - Number of Visits 16    Progress Note Due on Visit 10    PT Start Time 0940    PT Stop Time 1020    PT Time Calculation (min) 40 min    Activity Tolerance Patient tolerated treatment well    Behavior During Therapy WFL for tasks assessed/performed               Past Medical History:  Diagnosis Date   Anxiety    Clotting disorder ([place])    Depression    Dyspnea    GERD (gastroesophageal reflux disease)    Headache    Lumbosacral spondylosis without myelopathy    Neuropathy    Postlaminectomy syndrome, lumbar region    Postlaminectomy syndrome, thoracic region    Stroke [facility])    Past Surgical History:  Procedure Laterality Date   ABDOMINAL HYSTERECTOMY     BACK SURGERY     BIOPSY  10/02/2020   Procedure: BIOPSY;  Surgeon: Juanita Craver, MD;  Location: [place];  Service: Endoscopy;;   COLONOSCOPY WITH PROPOFOL N/A 10/02/2020   Procedure: COLONOSCOPY WITH PROPOFOL;  Surgeon: Juanita Craver, MD;  Location: [facility] ENDOSCOPY;  Service: Endoscopy;  Laterality: N/A;   ESOPHAGOGASTRODUODENOSCOPY (EGD) WITH PROPOFOL N/A 10/02/2020   Procedure: ESOPHAGOGASTRODUODENOSCOPY (EGD) WITH PROPOFOL;  Surgeon: Juanita Craver, MD;  Location: [facility] ENDOSCOPY;  Service: Endoscopy;  Laterality: N/A;   EYE SURGERY     eye removed right    GIVENS CAPSULE STUDY N/A 10/02/2020   Procedure: GIVENS CAPSULE STUDY;  Surgeon: Juanita Craver, MD;  Location: [facility] ENDOSCOPY;  Service: Endoscopy;  Laterality: N/A;   KNEE ARTHROSCOPY     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   Procedure: TOTAL KNEE ARTHROPLASTY;  Surgeon: Melrose Nakayama, MD;   Location: [place];  Service: Orthopedics;  Laterality: Right;   Patient Active Problem List   Diagnosis Date Noted   Iron deficiency anemia 10/23/2020   Symptomatic anemia 09/30/2020   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   Category 4 blindness of left eye 04/19/2019   Nocturia more than twice per night 04/19/2019   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   Nasal septal deviation 04/19/2019   Excessive daytime sleepiness 04/19/2019   Neuropathy 04/19/2019   Sleep-related headache 04/19/2019   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[place]) 04/19/2019   Rotator cuff syndrome of both shoulders 06/09/2018   Cervicalgia 03/19/2018   Disorder of rotator cuff syndrome of right shoulder and allied disorder 01/09/2018   Primary osteoarthritis of right knee 01/09/2016   Peripheral polyneuropathy 10/19/2015   Knee pain, chronic 06/11/2013   Thoracic postlaminectomy syndrome 06/11/2013   Pes planus of both feet 06/11/2013   Trochanteric bursitis of both hips 11/06/2012   Lumbosacral spondylosis without myelopathy 05/09/2011   DEPRESSION 07/24/2006   CARPAL TUNNEL SYNDROME, RIGHT 07/24/2006   MYOPIA, 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   DEGENERATIVE JOINT DISEASE 07/24/2006   DISORDER, LUMBAR DISC W/MYELOPATHY 07/24/2006   Cerebral venous sinus thrombosis 07/24/2006   AVULSION, EYE 12/23/2005    PCP: Darrol Jump, NP  REFERRING PROVIDER: Cheryll Cockayne, MD  REFERRING DIAG:  M79.18 (ICD-10-CM) -  Cervical myofascial pain syndrome  M54.2 (ICD-10-CM) - Cervicalgia  M54.81 (ICD-10-CM) - Bilateral occipital neuralgia    THERAPY DIAG:  Cervicalgia  Abnormal posture  Muscle weakness (generalized)  Rationale for Evaluation and Treatment Rehabilitation  ONSET DATE: 2022  SUBJECTIVE:                                                                                                                                                                                                          SUBJECTIVE STATEMENT: Pt reports some soreness today.  Pt states that she has been feeling knots in her legs and they are painful.  States that she is going to call her MD to get them checked out.   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  Requires SBA for mobility  WEIGHT BEARING RESTRICTIONS: No  FALLS:  Has patient fallen in last 6 months? Yes. Number of falls 1 PT has addressed this in the recent past   LIVING ENVIRONMENT: Lives with: lives with their family Lives in: House/apartment Stairs: No Has following equipment at home: Single point cane  OCCUPATION: NA  PLOF: Independent with household mobility with device, Requires assistive device for independence, and Needs assistance with ADLs  PATIENT GOALS: reduce pain, reduce headaches   OBJECTIVE:   DIAGNOSTIC FINDINGS:  None of cervical spine.  Scheduled for 01/02/22  PATIENT SURVEYS:  12/25/2021:  FOTO 32 (48 is goal) 01/29/2022:  FOTO 53%  COGNITION: Overall cognitive status: Within functional limits for tasks assessed  SENSATION: WFL  POSTURE: rounded shoulders, forward head, and flexed trunk   PALPATION: Diffuse palpable tenderness over bil neck, upper trap and suboccipitals.  Significant trigger points in bil upper traps and suboccipitals.  Reduced PA mobility in the cervical and thoracic spine,   CERVICAL ROM:   Active ROM A/PROM (deg) eval A/ROM 01/17/22  Flexion 55   Extension 25   Right lateral flexion 30   Left lateral flexion 25   Right rotation 55 65  Left rotation 55 65   (Blank rows = not tested)  UPPER EXTREMITY ROM: 12/25/2021:  Limited  bilaterally by 25% with pain in bil shoulders   UPPER EXTREMITY MMT: 12/25/2021:  Pain with this.  4-/5 throughout   TODAY'S TREATMENT:  DATE  02/05/2022: UBE level 1.0 x3 min each direction with PT present to discuss status Seated cervical rotation 3x20 seconds each Seated chin tuck 2x10 Sit to/from stand holding 5# kettlebell:  x10 with chest press, x10 with overhead press Seated shoulder flexion with 2# on cane 2x10 Shoulder ER and horizontal abduction with red tband 2x10 each (with cuing for improved posture) Trigger Point Dry-Needling  Treatment instructions: Expect mild to moderate muscle soreness. S/S of pneumothorax if dry needled over a lung field, and to seek immediate medical attention should they occur. Patient verbalized understanding of these instructions and education. Patient Consent Given: Yes Education handout provided: Yes Muscles treated: Right upper trap Electrical stimulation performed: No Parameters: N/A Treatment response/outcome: Utilized skilled palpation to identify trigger points.  Able to palpate muscle twitch and elongation following.   DATE 01/31/2022: UBE level 1.2 x4 min each direction with PT present to discuss status Seated cervical rotation 3x20 seconds each Upper trap stretch 2x20 sec bilat (with cuing to avoid excessive pull/stretch) Seated chin tuck 2x10 Sit to/from stand holding 5# kettlebell:  x10 with chest press, x10 with overhead press Seated shoulder flexion with 2# on cane 2x10 Shoulder ER and horizontal abduction with red tband 2x10 each (with cuing for improved posture) Manual: elongation and release to bil upper traps and cervical paraspinals   DATE 01/17/2022: UBE level 1.2 x4 min each direction with PT present to discuss status Seated cervical rotation 3x20 seconds each Upper trap stretch 2x20 sec bilat (with cuing to avoid excessive pull/stretch) Seated chin tuck 2x10 Sit to/from stand holding 5# kettlebell:  x10 with chest  press, x10 with overhead press Seated shoulder flexion with 2# on cane 2x10 Shoulder ER and horizontal abduction with red tband 2x10 each (with cuing for improved posture) Cervical FOTO 53% Seated serratus shoulder flexion with green loop 2x10 (pt requires max cuing for technique and posture) Shoulder flexion with tapping 1# dumbbell to middle shelf 2x10 bilat    PATIENT EDUCATION:  Education details: Access Code: [email or web address] Person educated: Patient Education method: Explanation, Demonstration, and Handouts Education comprehension: verbalized understanding and returned demonstration  HOME EXERCISE PROGRAM: Access Code: [email or web address] URL: [email or web address] Date: 12/25/2021 Prepared by: Claiborne Billings  Exercises - Seated Cervical Flexion AROM  - 3 x daily - 7 x weekly - 1 sets - 3 reps - 20 hold - Seated Cervical Sidebending AROM  - 3 x daily - 7 x weekly - 1 sets - 3 reps - 20 hold - Seated Cervical Rotation AROM  - 3 x daily - 7 x weekly - 1 sets - 3 reps - 20 hold - Seated Correct Posture  - 1 x daily - 7 x weekly - 3 sets - 10 reps - Supine Shoulder Flexion Extension AAROM with Dowel  - 3 x daily - 7 x weekly - 3 sets - 10 reps - Seated Shoulder Flexion AAROM with Dowel  - 3 x daily - 7 x weekly - 3 sets - 10 reps - Seated Shoulder Rolls  - 5 x daily - 7 x weekly - 1 sets - 10 reps   ASSESSMENT:  CLINICAL IMPRESSION: Pt with reports of increased tightness and pain this morning.  Patient is requesting dry needling today, as she feels that her upper trap has increased spasm.  Able to palpate trigger point on right upper trap and proceeded with dry needling.  Patient continues to progress with UE strengthening and improved postural muscle control and support.  Patient continues to report decreased headaches.  Recommend pt follow up with MD about the knots on her legs and she states that she will contact MD today.     OBJECTIVE IMPAIRMENTS: decreased activity tolerance, decreased  ROM, decreased strength, increased muscle spasms, impaired flexibility, postural dysfunction, and pain.   ACTIVITY LIMITATIONS: carrying, lifting, sitting, reach over head, and hygiene/grooming  PARTICIPATION LIMITATIONS: meal prep, cleaning, laundry, shopping, and community activity  PERSONAL FACTORS: Age and 1-2 comorbidities: chronic LBP, lumbar fusion, legally blind  are also affecting patient's functional outcome.  REHAB POTENTIAL: Good  CLINICAL DECISION MAKING: Stable/uncomplicated  EVALUATION COMPLEXITY: Low   GOALS: Goals reviewed with patient? Yes  SHORT TERM GOALS: Target date: 01/22/2022   Be independent in initial HEP Baseline:  Goal status: MET  2.  Report > or = to 25% reduction in the frequency and intensity of headaches with daily tasks  Baseline: 3-4/10 constant pain  Goal status: MET on 01/15/22 with pt reporting at least 24% less headaches.  3.  Reach overhead to a cabinet with 25% increased ease due to reduced neck pain Baseline: pain and inability to reach overhead Goal status: MET on 01/29/22   LONG TERM GOALS: Target date: 02/19/2022  Be independent in advanced HEP Baseline:  Goal status: IN PROGRESS  2.  Improve FOTO to > or = to 48  Baseline: 32 Goal status: MET on 01/29/2022  3. Report > or = to 50% reduction in the frequency and intensity of headaches with daily tasks  Baseline: 25% (01/17/22) Goal status: in progress   4.  Demonstrate > or = to 65-70 degrees of cervical rotation A/ROM to improve safety in the community Baseline: 65 Rt and Lt (01/17/22) Goal status: IN PROGRESS     PLAN:  PT FREQUENCY: 1-2x/week  PT DURATION: 8 weeks  PLANNED INTERVENTIONS: Therapeutic exercises, Therapeutic activity, Neuromuscular re-education, Balance training, Gait training, Patient/Family education, Self Care, Joint mobilization, Joint manipulation, Aquatic Therapy, Dry Needling, Cryotherapy, Moist heat, Taping, Traction, Ultrasound, Manual  therapy, and Re-evaluation  PLAN FOR NEXT SESSION: postural strength, cervical/thoracic mobility, manual therapy as needed.    Juel Burrow, PT 02/05/22 10:39 AM   [facility] [address], [place], [place] [address] Phone # [phone] Fax [phone]

## 2022-02-07 ENCOUNTER — Ambulatory Visit: Payer: Medicare HMO

## 2022-02-07 DIAGNOSIS — M542 Cervicalgia: Secondary | ICD-10-CM

## 2022-02-07 DIAGNOSIS — M6281 Muscle weakness (generalized): Secondary | ICD-10-CM

## 2022-02-07 DIAGNOSIS — R293 Abnormal posture: Secondary | ICD-10-CM

## 2022-02-07 NOTE — Therapy (Signed)
OUTPATIENT PHYSICAL THERAPY TREATMENT NOTE   Patient Name: Emily Peck MRN: [id number] DOB:20-Jan-1956, 66 y.o., female Today's Date: 02/07/2022   PT End of Session - 02/07/22 1059     Visit Number 9    Date for PT Re-Evaluation 02/19/22    Authorization Type [employer]    Authorization - Visit Number 9    Authorization - Number of Visits 16    Progress Note Due on Visit 10    PT Start Time 1018    PT Stop Time 1058    PT Time Calculation (min) 40 min    Activity Tolerance Patient tolerated treatment well    Behavior During Therapy WFL for tasks assessed/performed                Past Medical History:  Diagnosis Date   Anxiety    Clotting disorder ([place])    Depression    Dyspnea    GERD (gastroesophageal reflux disease)    Headache    Lumbosacral spondylosis without myelopathy    Neuropathy    Postlaminectomy syndrome, lumbar region    Postlaminectomy syndrome, thoracic region    Stroke [facility])    Past Surgical History:  Procedure Laterality Date   ABDOMINAL HYSTERECTOMY     BACK SURGERY     BIOPSY  10/02/2020   Procedure: BIOPSY;  Surgeon: Juanita Craver, MD;  Location: [place];  Service: Endoscopy;;   COLONOSCOPY WITH PROPOFOL N/A 10/02/2020   Procedure: COLONOSCOPY WITH PROPOFOL;  Surgeon: Juanita Craver, MD;  Location: [facility] ENDOSCOPY;  Service: Endoscopy;  Laterality: N/A;   ESOPHAGOGASTRODUODENOSCOPY (EGD) WITH PROPOFOL N/A 10/02/2020   Procedure: ESOPHAGOGASTRODUODENOSCOPY (EGD) WITH PROPOFOL;  Surgeon: Juanita Craver, MD;  Location[facility] END[facility]COPY;  Service: Endoscopy;  Laterality: N/A;   EYE SURGERY     eye removed right    GIVENS CAPSULE STUDY N/A 10/02/2020   Procedure: GIVENS CAPSULE STUDY;  Surgeon: Juanita Craver, MD;  Location: [facility] ENDOSCOPY;  Service: Endoscopy;  Laterality: N/A;   KNEE ARTHROSCOPY     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   Procedure: TOTAL KNEE ARTHROPLASTY;  Surgeon: Melrose Nakayama, MD;  Location: [place];  Service: Orthopedics;  Laterality:  Right;   Patient Active Problem List   Diagnosis Date Noted   Iron deficiency anemia 10/23/2020   Symptomatic anemia 09/30/2020   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   Category 4 blindness of left eye 04/19/2019   Nocturia more than twice per night 04/19/2019   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   Nasal septal deviation 04/19/2019   Excessive daytime sleepiness 04/19/2019   Neuropathy 04/19/2019   Sleep-related headache 04/19/2019   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[place]) 04/19/2019   Rotator cuff syndrome of both shoulders 06/09/2018   Cervicalgia 03/19/2018   Disorder of rotator cuff syndrome of right shoulder and allied disorder 01/09/2018   Primary osteoarthritis of right knee 01/09/2016   Peripheral polyneuropathy 10/19/2015   Knee pain, chronic 06/11/2013   Thoracic postlaminectomy syndrome 06/11/2013   Pes planus of both feet 06/11/2013   Trochanteric bursitis of both hips 11/06/2012   Lumbosacral spondylosis without myelopathy 05/09/2011   DEPRESSION 07/24/2006   CARPAL TUNNEL SYNDROME, RIGHT 07/24/2006   MYOPIA, 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   DEGENERATIVE JOINT DISEASE 07/24/2006   DISORDER, LUMBAR DISC W/MYELOPATHY 07/24/2006   Cerebral venous sinus thrombosis 07/24/2006   AVULSION, EYE 12/23/2005    PCP: Darrol Jump, NP  REFERRING PROVIDER: Cheryll Cockayne, MD  REFERRING DIAG:  M79.18 (ICD-10-CM) - Cervical myofascial pain syndrome  M54.2 (ICD-10-CM) -  Cervicalgia  M54.81 (ICD-10-CM) - Bilateral occipital neuralgia    THERAPY DIAG:  Cervicalgia  Abnormal posture  Muscle weakness (generalized)  Rationale for Evaluation and Treatment Rehabilitation  ONSET DATE: 2022  SUBJECTIVE:                                                                                                                                                                                                         SUBJECTIVE STATEMENT: I have some pain in my neck  on both sides. 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  Requires SBA for mobility  WEIGHT BEARING RESTRICTIONS: No  FALLS:  Has patient fallen in last 6 months? Yes. Number of falls 1 PT has addressed this in the recent past   LIVING ENVIRONMENT: Lives with: lives with their family Lives in: House/apartment Stairs: No Has following equipment at home: Single point cane  OCCUPATION: NA  PLOF: Independent with household mobility with device, Requires assistive device for independence, and Needs assistance with ADLs  PATIENT GOALS: reduce pain, reduce headaches   OBJECTIVE:   DIAGNOSTIC FINDINGS:  None of cervical spine.  Scheduled for 01/02/22  PATIENT SURVEYS:  12/25/2021:  FOTO 32 (48 is goal) 01/29/2022:  FOTO 53%  COGNITION: Overall cognitive status: Within functional limits for tasks assessed  SENSATION: WFL  POSTURE: rounded shoulders, forward head, and flexed trunk   PALPATION: Diffuse palpable tenderness over bil neck, upper trap and suboccipitals.  Significant trigger points in bil upper traps and suboccipitals.  Reduced PA mobility in the cervical and thoracic spine,   CERVICAL ROM:   Active ROM A/PROM (deg) eval A/ROM 01/17/22  Flexion 55   Extension 25   Right lateral flexion 30   Left lateral flexion 25   Right rotation 55 65  Left rotation 55 65   (Blank rows = not tested)  UPPER EXTREMITY ROM: 12/25/2021:  Limited bilaterally by 25% with pain in bil shoulders   UPPER EXTREMITY MMT: 12/25/2021:  Pain with this.  4-/5 throughout   TODAY'S TREATMENT: DATE 02/07/2022: UBE level 1.2  x4 min each direction with PT present to discuss status Seated cervical rotation 3x20 seconds each Upper trap stretch 2x20 sec bilat (with cuing to avoid excessive  pull/stretch) Sit to/from stand holding 5# kettlebell:  x10 with chest press, x10 with overhead press Seated shoulder flexion with 2# on cane 2x10 Shoulder ER and horizontal abduction with red tband 2x10 each (with cuing for improved posture) Manual: elongation and release to bil upper traps and cervical paraspinals  DATE 02/05/2022: UBE level 1.0 x3 min each direction with PT present to discuss status Seated cervical rotation 3x20 seconds each Seated chin tuck 2x10 Sit to/from stand holding 5# kettlebell:  x10 with chest press, x10 with overhead press Seated shoulder flexion with 2# on cane 2x10 Shoulder ER and horizontal abduction with red tband 2x10 each (with cuing for improved posture) Trigger Point Dry-Needling  Treatment instructions: Expect mild to moderate muscle soreness. S/S of pneumothorax if dry needled over a lung field, and to seek immediate medical attention should they occur. Patient verbalized understanding of these instructions and education. Patient Consent Given: Yes Education handout provided: Yes Muscles treated: Right upper trap Electrical stimulation performed: No Parameters: N/A Treatment response/outcome: Utilized skilled palpation to identify trigger points.  Able to palpate muscle twitch and elongation following.   DATE 01/31/2022: UBE level 1.2 x4 min each direction with PT present to discuss status Seated cervical rotation 3x20 seconds each Upper trap stretch 2x20 sec bilat (with cuing to avoid excessive pull/stretch) Seated chin tuck 2x10 Sit to/from stand holding 5# kettlebell:  x10 with chest press, x10 with overhead press Seated shoulder flexion with 2# on cane 2x10 Shoulder ER and horizontal abduction with red tband 2x10 each (with cuing for improved posture) Manual: elongation and  release to bil upper traps and cervical paraspinals   PATIENT EDUCATION:  Education details: Access Code: [email or web address] Person educated: Patient Education method: Explanation, Demonstration, and Handouts Education comprehension: verbalized understanding and returned demonstration  HOME EXERCISE PROGRAM: Access Code: [email or web address] URL: [email or web address] Date: 12/25/2021 Prepared by: Claiborne Billings  Exercises - Seated Cervical Flexion AROM  - 3 x daily - 7 x weekly - 1 sets - 3 reps - 20 hold - Seated Cervical Sidebending AROM  - 3 x daily - 7 x weekly - 1 sets - 3 reps - 20 hold - Seated Cervical Rotation AROM  - 3 x daily - 7 x weekly - 1 sets - 3 reps - 20 hold - Seated Correct Posture  - 1 x daily - 7 x weekly - 3 sets - 10 reps - Supine Shoulder Flexion Extension AAROM with Dowel  - 3 x daily - 7 x weekly - 3 sets - 10 reps - Seated Shoulder Flexion AAROM with Dowel  - 3 x daily - 7 x weekly - 3 sets - 10 reps - Seated Shoulder Rolls  - 5 x daily - 7 x weekly - 1 sets - 10 reps   ASSESSMENT:  CLINICAL IMPRESSION: Pt with reports of increased tightness and pain this morning in bil neck.  Pt is consistent with her HEP for flexibility and strength and reports 75% reduction in headaches since the start of care.  Patient continues to progress with UE strengthening and improved postural muscle control and support.  Patient continues to report decreased headaches.  Recommend pt follow up with MD about the knots on her legs and she states that she will contact MD today.     OBJECTIVE IMPAIRMENTS: decreased activity tolerance, decreased ROM, decreased strength, increased muscle spasms, impaired flexibility, postural dysfunction, and pain.   ACTIVITY LIMITATIONS: carrying, lifting, sitting, reach over head, and hygiene/grooming  PARTICIPATION LIMITATIONS: meal prep, cleaning, laundry, shopping,  and community activity  PERSONAL FACTORS: Age and 1-2 comorbidities: chronic LBP, lumbar  fusion, legally blind  are also affecting patient's functional outcome.  REHAB POTENTIAL: Good  CLINICAL DECISION MAKING: Stable/uncomplicated  EVALUATION COMPLEXITY: Low   GOALS: Goals reviewed with patient? Yes  SHORT TERM GOALS: Target date: 01/22/2022   Be independent in initial HEP Baseline:  Goal status: MET  2.  Report > or = to 25% reduction in the frequency and intensity of headaches with daily tasks  Baseline: 3-4/10 constant pain  Goal status: MET on 01/15/22 with pt reporting at least 24% less headaches.  3.  Reach overhead to a cabinet with 25% increased ease due to reduced neck pain Baseline: pain and inability to reach overhead Goal status: MET on 01/29/22   LONG TERM GOALS: Target date: 02/19/2022  Be independent in advanced HEP Baseline:  Goal status: IN PROGRESS  2.  Improve FOTO to > or = to 48  Baseline:  Goal status: MET on 01/29/2022  3. Report > or = to 50% reduction in the frequency and intensity of headaches with daily tasks  Baseline: 75% (02/07/22) Goal status:MET  4.  Demonstrate > or = to 65-70 degrees of cervical rotation A/ROM to improve safety in the community Baseline: 65 Rt and Lt (01/17/22) Goal status: IN PROGRESS     PLAN:  PT FREQUENCY: 1-2x/week  PT DURATION: 8 weeks  PLANNED INTERVENTIONS: Therapeutic exercises, Therapeutic activity, Neuromuscular re-education, Balance training, Gait training, Patient/Family education, Self Care, Joint mobilization, Joint manipulation, Aquatic Therapy, Dry Needling, Cryotherapy, Moist heat, Taping, Traction, Ultrasound, Manual therapy, and Re-evaluation  PLAN FOR NEXT SESSION: postural strength, cervical/thoracic mobility, manual therapy as needed. 10th visit next   Sigurd Sos, PT 02/07/22 11:00 AM   [facility] [address] [place], [place] [address] Phone # [phone] Fax [phone]

## 2022-02-12 ENCOUNTER — Ambulatory Visit: Payer: Medicare HMO | Admitting: Rehabilitative and Restorative Service Providers"

## 2022-02-14 ENCOUNTER — Ambulatory Visit: Payer: Medicare HMO | Admitting: Rehabilitative and Restorative Service Providers"

## 2022-02-14 ENCOUNTER — Encounter: Payer: Self-pay | Admitting: Rehabilitative and Restorative Service Providers"

## 2022-02-14 DIAGNOSIS — M542 Cervicalgia: Secondary | ICD-10-CM

## 2022-02-14 DIAGNOSIS — M6281 Muscle weakness (generalized): Secondary | ICD-10-CM

## 2022-02-14 DIAGNOSIS — R293 Abnormal posture: Secondary | ICD-10-CM

## 2022-02-14 NOTE — Therapy (Signed)
OUTPATIENT PHYSICAL THERAPY TREATMENT NOTE   Patient Name: Emily Peck MRN: [id number] DOB:05/03/1955, 66 y.o., female Today's Date: 02/14/2022   PT End of Session - 02/14/22 0949     Visit Number 10    Date for PT Re-Evaluation 02/19/22    Authorization Type [employer]    Authorization Time Period 12/25/2021 - 02/19/2022    Authorization - Visit Number 10    Authorization - Number of Visits 16    Progress Note Due on Visit 30    PT Start Time 0945    PT Stop Time 1025    PT Time Calculation (min) 40 min    Activity Tolerance Patient tolerated treatment well    Behavior During Therapy WFL for tasks assessed/performed                Past Medical History:  Diagnosis Date   Anxiety    Clotting disorder ([place])    Depression    Dyspnea    GERD (gastroesophageal reflux disease)    Headache    Lumbosacral spondylosis without myelopathy    Neuropathy    Postlaminectomy syndrome, lumbar region    Postlaminectomy syndrome, thoracic region    Stroke [facility])    Past Surgical History:  Procedure Laterality Date   ABDOMINAL HYSTERECTOMY     BACK SURGERY     BIOPSY  10/02/2020   Procedure: BIOPSY;  Surgeon: Juanita Craver, MD;  Location: [place];  Service: Endoscopy;;   COLONOSCOPY WITH PROPOFOL N/A 10/02/2020   Procedure: COLONOSCOPY WITH PROPOFOL;  Surgeon: Juanita Craver, MD;  Location: [facility] ENDOSCOPY;  Service: Endoscopy;  Laterality: N/A;   ESOPHAGOGASTRODUODENOSCOPY (EGD) WITH PROPOFOL N/A 10/02/2020   Procedure: ESOPHAGOGASTRODUODENOSCOPY (EGD) WITH PROPOFOL;  Surgeon: Juanita Craver, MD;  Location: [facility] ENDOSCOPY;  Service: Endoscopy;  Laterality: N/A;   EYE SURGERY     eye removed right    GIVENS CAPSULE STUDY N/A 10/02/2020   Procedure: GIVENS CAPSULE STUDY;  Surgeon: Juanita Craver, MD;  Location: [facility] ENDOSCOPY;  Service: Endoscopy;  Laterality: N/A;   KNEE ARTHROSCOPY     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   Procedure: TOTAL KNEE ARTHROPLASTY;  Surgeon: Melrose Nakayama,  MD;  Location: [place];  Service: Orthopedics;  Laterality: Right;   Patient Active Problem List   Diagnosis Date Noted   Iron deficiency anemia 10/23/2020   Symptomatic anemia 09/30/2020   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   Category 4 blindness of left eye 04/19/2019   Nocturia more than twice per night 04/19/2019   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   Nasal septal deviation 04/19/2019   Excessive daytime sleepiness 04/19/2019   Neuropathy 04/19/2019   Sleep-related headache 04/19/2019   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[place]) 04/19/2019   Rotator cuff syndrome of both shoulders 06/09/2018   Cervicalgia 03/19/2018   Disorder of rotator cuff syndrome of right shoulder and allied disorder 01/09/2018   Primary osteoarthritis of right knee 01/09/2016   Peripheral polyneuropathy 10/19/2015   Knee pain, chronic 06/11/2013   Thoracic postlaminectomy syndrome 06/11/2013   Pes planus of both feet 06/11/2013   Trochanteric bursitis of both hips 11/06/2012   Lumbosacral spondylosis without myelopathy 05/09/2011   DEPRESSION 07/24/2006   CARPAL TUNNEL SYNDROME, RIGHT 07/24/2006   MYOPIA, 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   DEGENERATIVE JOINT DISEASE 07/24/2006   DISORDER, LUMBAR DISC W/MYELOPATHY 07/24/2006   Cerebral venous sinus thrombosis 07/24/2006   AVULSION, EYE 12/23/2005    PCP: Darrol Jump, NP  REFERRING PROVIDER: Cheryll Cockayne, MD  REFERRING DIAG:  M79.18 (  ICD-10-CM) - Cervical myofascial pain syndrome  M54.2 (ICD-10-CM) - Cervicalgia  M54.81 (ICD-10-CM) - Bilateral occipital neuralgia    THERAPY DIAG:  Cervicalgia  Abnormal posture  Muscle weakness (generalized)  Rationale for Evaluation and Treatment Rehabilitation  ONSET DATE: 2022  SUBJECTIVE:                                                                                                                                                                                                          SUBJECTIVE STATEMENT: Pt reports that she is still having some headaches.  States that her MD came to see her about her pain in her legs and thinks it is due to neuropathy.  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  Requires SBA for mobility  WEIGHT BEARING RESTRICTIONS: No  FALLS:  Has patient fallen in last 6 months? Yes. Number of falls 1 PT has addressed this in the recent past   LIVING ENVIRONMENT: Lives with: lives with their family Lives in: House/apartment Stairs: No Has following equipment at home: Single point cane  OCCUPATION: NA  PLOF: Independent with household mobility with device, Requires assistive device for independence, and Needs assistance with ADLs  PATIENT GOALS: reduce pain, reduce headaches   OBJECTIVE:   DIAGNOSTIC FINDINGS:  None of cervical spine.  Scheduled for 01/02/22  PATIENT SURVEYS:  12/25/2021:  FOTO 32 (48 is goal) 01/29/2022:  FOTO 53%  COGNITION: Overall cognitive status: Within functional limits for tasks assessed  SENSATION: WFL  POSTURE: rounded shoulders, forward head, and flexed trunk   PALPATION: Diffuse palpable tenderness over bil neck, upper trap and suboccipitals.  Significant trigger points in bil upper traps and suboccipitals.  Reduced PA mobility in the cervical and thoracic spine,   CERVICAL ROM:   Active ROM A/PROM (deg) eval A/ROM 01/17/22 A/ROM (Deg) 02/14/22  Flexion 55  55  Extension 25  40  Right lateral flexion 30  40  Left lateral flexion 25  40  Right rotation 55 65 65  Left rotation 55 65 65   (Blank rows = not tested)  UPPER EXTREMITY ROM: 12/25/2021:   Limited bilaterally by 25% with pain in bil shoulders   UPPER EXTREMITY MMT: 12/25/2021:  Pain with this.  4-/5 throughout  TODAY'S TREATMENT:  DATE 02/14/2022: UBE level 1.3 x3 min each direction with PT present to discuss status Upper trap and levator stretch 2x20 sec bilat (with cuing to avoid excessive pull/stretch) Sit to/from stand holding 5# kettlebell:  x10 with chest press, x10 with overhead press Seated shoulder flexion with 2.5# on cane 2x10 Shoulder ER and horizontal abduction with red tband 2x10 each (with cuing for improved posture) Seated shoulder diagonals with red tband 2x10 bilat/each direction Counter stretch 2x20 sec each Barre push ups 2x10   DATE 02/07/2022: UBE level 1.2 x4 min each direction with PT present to discuss status Seated cervical rotation 3x20 seconds each Upper trap stretch 2x20 sec bilat (with cuing to avoid excessive pull/stretch) Sit to/from stand holding 5# kettlebell:  x10 with chest press, x10 with overhead press Seated shoulder flexion with 2# on cane 2x10 Shoulder ER and horizontal abduction with red tband 2x10 each (with cuing for improved posture) Manual: elongation and release to bil upper traps and cervical paraspinals  DATE 02/05/2022: UBE level 1.0 x3 min each direction with PT present to discuss status Seated cervical rotation 3x20 seconds each Seated chin tuck 2x10 Sit to/from stand holding 5# kettlebell:  x10 with chest press, x10 with overhead press Seated shoulder flexion with 2# on cane 2x10 Shoulder ER and horizontal abduction with red tband 2x10 each (with cuing for improved posture) Trigger Point Dry-Needling  Treatment instructions: Expect mild to moderate muscle soreness. S/S of pneumothorax if dry needled over a lung field, and to seek immediate medical attention should they occur. Patient verbalized understanding of these instructions and education. Patient Consent Given: Yes Education handout provided: Yes Muscles  treated: Right upper trap Electrical stimulation performed: No Parameters: N/A Treatment response/outcome: Utilized skilled palpation to identify trigger points.  Able to palpate muscle twitch and elongation following.     PATIENT EDUCATION:  Education details: Access Code: [email or web address] Person educated: Patient Education method: Explanation, Demonstration, and Handouts Education comprehension: verbalized understanding and returned demonstration  HOME EXERCISE PROGRAM: Access Code: [email or web address] URL: [email or web address] Date: 12/25/2021 Prepared by: Claiborne Billings  Exercises - Seated Cervical Flexion AROM  - 3 x daily - 7 x weekly - 1 sets - 3 reps - 20 hold - Seated Cervical Sidebending AROM  - 3 x daily - 7 x weekly - 1 sets - 3 reps - 20 hold - Seated Cervical Rotation AROM  - 3 x daily - 7 x weekly - 1 sets - 3 reps - 20 hold - Seated Correct Posture  - 1 x daily - 7 x weekly - 3 sets - 10 reps - Supine Shoulder Flexion Extension AAROM with Dowel  - 3 x daily - 7 x weekly - 3 sets - 10 reps - Seated Shoulder Flexion AAROM with Dowel  - 3 x daily - 7 x weekly - 3 sets - 10 reps - Seated Shoulder Rolls  - 5 x daily - 7 x weekly - 1 sets - 10 reps   ASSESSMENT:  CLINICAL IMPRESSION: Pt with reports of continued improvements with cervical pain.  Patient with increased cervical A/ROM today to be [facility]  Patient is progressing overall towards goals and improved mobility at home.  Patient is on track for discharge next visit, as anticipated.   OBJECTIVE IMPAIRMENTS: decreased activity tolerance, decreased ROM, decreased strength, increased muscle spasms, impaired flexibility, postural dysfunction, and pain.   ACTIVITY LIMITATIONS: carrying, lifting, sitting, reach over head, and hygiene/grooming  PARTICIPATION LIMITATIONS: meal prep, cleaning, laundry, shopping, and community activity  PERSONAL FACTORS: Age and 1-2 comorbidities: chronic LBP, lumbar fusion, legally blind  are also  affecting patient's functional outcome.  REHAB POTENTIAL: Good  CLINICAL DECISION MAKING: Stable/uncomplicated  EVALUATION COMPLEXITY: Low   GOALS: Goals reviewed with patient? Yes  SHORT TERM GOALS: Target date: 01/22/2022   Be independent in initial HEP Baseline:  Goal status: MET  2.  Report > or = to 25% reduction in the frequency and intensity of headaches with daily tasks  Baseline: 3-4/10 constant pain  Goal status: MET on 01/15/22 with pt reporting at least 24% less headaches.  3.  Reach overhead to a cabinet with 25% increased ease due to reduced neck pain Baseline: pain and inability to reach overhead Goal status: MET on 01/29/22   LONG TERM GOALS: Target date: 02/19/2022  Be independent in advanced HEP Baseline:  Goal status: IN PROGRESS  2.  Improve FOTO to > or = to 48  Baseline:  Goal status: MET on 01/29/2022  3. Report > or = to 50% reduction in the frequency and intensity of headaches with daily tasks  Baseline: 75% (02/07/22) Goal status: MET  4.  Demonstrate > or = to 65-70 degrees of cervical rotation A/ROM to improve safety in the community Baseline: 65 Rt and Lt (01/17/22) Goal status: MET on 02/14/2022     PLAN:  PT FREQUENCY: 1-2x/week  PT DURATION: 8 weeks  PLANNED INTERVENTIONS: Therapeutic exercises, Therapeutic activity, Neuromuscular re-education, Balance training, Gait training, Patient/Family education, Self Care, Joint mobilization, Joint manipulation, Aquatic Therapy, Dry Needling, Cryotherapy, Moist heat, Taping, Traction, Ultrasound, Manual therapy, and Re-evaluation  PLAN FOR NEXT SESSION: discharge next visit   Cara Aguino, PT 02/14/22 10:42 AM   [facility] [address] [place] [address] [place], [place] [address] Phone # [phone] Fax [phone]

## 2022-02-19 ENCOUNTER — Ambulatory Visit: Payer: Medicare HMO | Admitting: Rehabilitative and Restorative Service Providers"

## 2022-02-19 ENCOUNTER — Encounter: Payer: Self-pay | Admitting: Rehabilitative and Restorative Service Providers"

## 2022-02-19 DIAGNOSIS — M6281 Muscle weakness (generalized): Secondary | ICD-10-CM

## 2022-02-19 DIAGNOSIS — M542 Cervicalgia: Secondary | ICD-10-CM | POA: Diagnosis not present

## 2022-02-19 DIAGNOSIS — R293 Abnormal posture: Secondary | ICD-10-CM

## 2022-02-19 NOTE — Therapy (Signed)
OUTPATIENT PHYSICAL THERAPY TREATMENT NOTE AND DISCHARGE SUMMARY   Patient Name: Emily Peck MRN: [id number] DOB:[birth year], 66 y.o., female Today's Date: 02/19/2022   PT End of Session - 02/19/22 1019     Visit Number 11    Date for PT Re-Evaluation 02/19/22    Authorization Type [employer]    Authorization Time Period 12/25/2021 - 02/19/2022    Authorization - Visit Number 11    Authorization - Number of Visits 16    Progress Note Due on Visit 20    PT Start Time 5449    PT Stop Time 1055    PT Time Calculation (min) 40 min    Activity Tolerance Patient tolerated treatment well    Behavior During Therapy WFL for tasks assessed/performed                Past Medical History:  Diagnosis Date   Anxiety    Clotting disorder ([place])    Depression    Dyspnea    GERD (gastroesophageal reflux disease)    Headache    Lumbosacral spondylosis without myelopathy    Neuropathy    Postlaminectomy syndrome, lumbar region    Postlaminectomy syndrome, thoracic region    Stroke [facility])    Past Surgical History:  Procedure Laterality Date   ABDOMINAL HYSTERECTOMY     BACK SURGERY     BIOPSY  10/02/2020   Procedure: BIOPSY;  Surgeon: Juanita Craver, MD;  Location: [place];  Service: Endoscopy;;   COLONOSCOPY WITH PROPOFOL N/A 10/02/2020   Procedure: COLONOSCOPY WITH PROPOFOL;  Surgeon: Juanita Craver, MD;  Location: [facility] ENDOSCOPY;  Service: Endoscopy;  Laterality: N/A;   ESOPHAGOGASTRODUODENOSCOPY (EGD) WITH PROPOFOL N/A 10/02/2020   Procedure: ESOPHAGOGASTRODUODENOSCOPY (EGD) WITH PROPOFOL;  Surgeon: Juanita Craver, MD;  [facility] ENDOSCOPY;  Service: Endoscopy;  Laterality: N/A;   EYE SURGERY     eye removed right    GIVENS CAPSULE STUDY N/A 10/02/2020   Procedure: GIVENS CAPSULE STUDY;  Surgeon: Juanita Craver, MD;  Location: [facility] ENDOSCOPY;  Service: Endoscopy;  Laterality: N/A;   KNEE ARTHROSCOPY     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   Procedure: TOTAL KNEE ARTHROPLASTY;   Surgeon: Melrose Nakayama, MD;  Location: [place];  Service: Orthopedics;  Laterality: Right;   Patient Active Problem List   Diagnosis Date Noted   Iron deficiency anemia 10/23/2020   Symptomatic anemia 09/30/2020   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   Category 4 blindness of left eye 04/19/2019   Nocturia more than twice per night 04/19/2019   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   Nasal septal deviation 04/19/2019   Excessive daytime sleepiness 04/19/2019   Neuropathy 04/19/2019   Sleep-related headache 04/19/2019   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[place]) 04/19/2019   Rotator cuff syndrome of both shoulders 06/09/2018   Cervicalgia 03/19/2018   Disorder of rotator cuff syndrome of right shoulder and allied disorder 01/09/2018   Primary osteoarthritis of right knee 01/09/2016   Peripheral polyneuropathy 10/19/2015   Knee pain, chronic 06/11/2013   Thoracic postlaminectomy syndrome 06/11/2013   Pes planus of both feet 06/11/2013   Trochanteric bursitis of both hips 11/06/2012   Lumbosacral spondylosis without myelopathy 05/09/2011   DEPRESSION 07/24/2006   CARPAL TUNNEL SYNDROME, RIGHT 07/24/2006   MYOPIA, 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   DEGENERATIVE JOINT DISEASE 07/24/2006   DISORDER, LUMBAR DISC W/MYELOPATHY 07/24/2006   Cerebral venous sinus thrombosis 07/24/2006   AVULSION, EYE 12/23/2005    PCP: Darrol Jump, NP  REFERRING PROVIDER: Cheryll Cockayne, MD  REFERRING  DIAG:  M79.18 (ICD-10-CM) - Cervical myofascial pain syndrome  M54.2 (ICD-10-CM) - Cervicalgia  M54.81 (ICD-10-CM) - Bilateral occipital neuralgia    THERAPY DIAG:  Cervicalgia  Abnormal posture  Muscle weakness (generalized)  Rationale for Evaluation and Treatment Rehabilitation  ONSET DATE: 2022  SUBJECTIVE:                                                                                                                                                                                                          SUBJECTIVE STATEMENT: Pt reports that she is still having some headaches.  States that her MD came to see her about her pain in her legs and thinks it is due to neuropathy.  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  Requires SBA for mobility  WEIGHT BEARING RESTRICTIONS: No  FALLS:  Has patient fallen in last 6 months? Yes. Number of falls 1 PT has addressed this in the recent past   LIVING ENVIRONMENT: Lives with: lives with their family Lives in: House/apartment Stairs: No Has following equipment at home: Single point cane  OCCUPATION: NA  PLOF: Independent with household mobility with device, Requires assistive device for independence, and Needs assistance with ADLs  PATIENT GOALS: reduce pain, reduce headaches   OBJECTIVE:   DIAGNOSTIC FINDINGS:  None of cervical spine.  Scheduled for 01/02/22  PATIENT SURVEYS:  12/25/2021:  FOTO 32 (48 is goal) 01/29/2022:  FOTO 53%  COGNITION: Overall cognitive status: Within functional limits for tasks assessed  SENSATION: WFL  POSTURE: rounded shoulders, forward head, and flexed trunk   PALPATION: Diffuse palpable tenderness over bil neck, upper trap and suboccipitals.  Significant trigger points in bil upper traps and suboccipitals.  Reduced PA mobility in the cervical and thoracic spine,   CERVICAL ROM:   Active ROM A/PROM (deg) eval A/ROM 01/17/22 A/ROM (Deg) 02/14/22  Flexion 55  55  Extension 25  40  Right lateral flexion 30  40  Left lateral flexion 25  40  Right rotation 55 65 65  Left rotation 55 65 65   (Blank rows = not tested)  UPPER  EXTREMITY ROM: 12/25/2021:  Limited bilaterally by 25% with pain in bil shoulders   UPPER EXTREMITY MMT: 12/25/2021:  Pain with this.  4-/5 throughout   TODAY'S TREATMENT:  DATE 02/19/2022: UBE level 1.3 x3 min each direction with PT present to discuss status Upper trap and levator stretch 2x20 sec bilat  Seated chin tuck 3x10 Seated cervical rotation 3x20 seconds each Sit to/from stand holding 5# kettlebell:  x10 with chest press, x10 with overhead press Shoulder ER and horizontal abduction with red tband 2x10 each (with cuing for improved posture) Seated shoulder diagonals with red tband 2x10 bilat/each direction Counter stretch 2x20 sec each Barre push ups 2x10   DATE 02/14/2022: UBE level 1.3 x3 min each direction with PT present to discuss status Upper trap and levator stretch 2x20 sec bilat (with cuing to avoid excessive pull/stretch) Sit to/from stand holding 5# kettlebell:  x10 with chest press, x10 with overhead press Seated shoulder flexion with 2.5# on cane 2x10 Shoulder ER and horizontal abduction with red tband 2x10 each (with cuing for improved posture) Seated shoulder diagonals with red tband 2x10 bilat/each direction Counter stretch 2x20 sec each Barre push ups 2x10   DATE 02/07/2022: UBE level 1.2 x4 min each direction with PT present to discuss status Seated cervical rotation 3x20 seconds each Upper trap stretch 2x20 sec bilat (with cuing to avoid excessive pull/stretch) Sit to/from stand holding 5# kettlebell:  x10 with chest press, x10 with overhead press Seated shoulder flexion with 2# on cane 2x10 Shoulder ER and horizontal abduction with red tband 2x10 each (with cuing for improved posture) Manual: elongation and release to bil upper traps and cervical paraspinals     PATIENT EDUCATION:  Education details: Access Code: [email or web address] Person educated: Patient Education method: Explanation, Demonstration, and Handouts Education comprehension: verbalized  understanding and returned demonstration  HOME EXERCISE PROGRAM: Access Code: [email or web address] URL: [email or web address] Date: 12/25/2021 Prepared by: Claiborne Billings  Exercises - Seated Cervical Flexion AROM  - 3 x daily - 7 x weekly - 1 sets - 3 reps - 20 hold - Seated Cervical Sidebending AROM  - 3 x daily - 7 x weekly - 1 sets - 3 reps - 20 hold - Seated Cervical Rotation AROM  - 3 x daily - 7 x weekly - 1 sets - 3 reps - 20 hold - Seated Correct Posture  - 1 x daily - 7 x weekly - 3 sets - 10 reps - Supine Shoulder Flexion Extension AAROM with Dowel  - 3 x daily - 7 x weekly - 3 sets - 10 reps - Seated Shoulder Flexion AAROM with Dowel  - 3 x daily - 7 x weekly - 3 sets - 10 reps - Seated Shoulder Rolls  - 5 x daily - 7 x weekly - 1 sets - 10 reps   ASSESSMENT:  CLINICAL IMPRESSION: Ms Puerto presents to skilled PT with reports of improving, just having some stiffness secondary to OA.  Patient has met all goals at this time.  Patient is independent with HEP and knowledgeable.  Patient is ready for discharge from skilled PT at this time to continue with HEP with all goals being met.   OBJECTIVE IMPAIRMENTS: decreased activity tolerance, decreased ROM, decreased strength, increased muscle spasms, impaired flexibility, postural dysfunction, and pain.   ACTIVITY LIMITATIONS: carrying, lifting, sitting, reach over head, and hygiene/grooming  PARTICIPATION LIMITATIONS: meal prep, cleaning, laundry, shopping, and community activity  PERSONAL FACTORS: Age and 1-2 comorbidities: chronic LBP, lumbar fusion, legally blind  are also affecting patient's functional outcome.  REHAB POTENTIAL: Good  CLINICAL DECISION MAKING: Stable/uncomplicated  EVALUATION COMPLEXITY: Low   GOALS: Goals reviewed with  patient? Yes  SHORT TERM GOALS: Target date: 01/22/2022   Be independent in initial HEP Baseline:  Goal status: MET  2.  Report > or = to 25% reduction in the frequency and intensity  of headaches with daily tasks  Baseline: 3-4/10 constant pain  Goal status: MET on 01/15/22 with pt reporting at least 24% less headaches.  3.  Reach overhead to a cabinet with 25% increased ease due to reduced neck pain Baseline: pain and inability to reach overhead Goal status: MET on 01/29/22   LONG TERM GOALS: Target date: 02/19/2022  Be independent in advanced HEP Baseline:  Goal status: MET  2.  Improve FOTO to > or = to 48  Baseline:  Goal status: MET on 01/29/2022  3. Report > or = to 50% reduction in the frequency and intensity of headaches with daily tasks  Baseline: 75% (02/07/22) Goal status: MET  4.  Demonstrate > or = to 65-70 degrees of cervical rotation A/ROM to improve safety in the community Baseline: 65 Rt and Lt (01/17/22) Goal status: MET on 02/14/2022     PLAN:  PT FREQUENCY: 1-2x/week  PT DURATION: 8 weeks  PLANNED INTERVENTIONS: Therapeutic exercises, Therapeutic activity, Neuromuscular re-education, Balance training, Gait training, Patient/Family education, Self Care, Joint mobilization, Joint manipulation, Aquatic Therapy, Dry Needling, Cryotherapy, Moist heat, Taping, Traction, Ultrasound, Manual therapy, and Re-evaluation  PLAN FOR NEXT SESSION: discharge on 02/19/2022   PHYSICAL THERAPY DISCHARGE SUMMARY   Patient agrees to discharge. Patient goals were met. Patient is being discharged due to meeting the stated rehab goals.    Juel Burrow, PT 02/19/22 11:00 AM   [facility] [address] [place], [place] [address] Phone # [phone] Fax [phone]

## 2022-07-30 NOTE — Progress Notes (Unsigned)
PATIENT: Emily Peck DOB: 1955-07-17  REASON FOR VISIT: follow up HISTORY FROM: patient  No chief complaint on file.   HISTORY OF PRESENT ILLNESS:  07/30/22 ALL: Emily Peck returns for follow up for peripheral neuropathy. She continues gabapentin 1200mg  TID.   She was seen by Emily Peck in consult for headaches 12/2021. MRI cervical spine ordered and showed degenerative changes but no specific nerve root compression. ESIs offered. PT/OT ordered. She completed therapy 02/2022 as goals were met. Occipital nerve block?   She is no longer followed by Emily Peck. Drug screen positive for alcohol and warning letter sent. Emily Peck   From a thorough review of records, medications tried that can be used in headache management include: Gabapentin, topiramate, Celexa, Lexapro, magnesium, trazodone, hydrocodone, Flexeril, Tylenol, Tylenol 3, aspirin, baclofen, Soma, Flexeril, Valium, codeine, Benadryl, ibuprofen, ketorolac injections, lidocaine injections and patches, meclizine, Robaxin, Reglan orally and injection, Zofran oral and injection, prednisone tablets, tizanidine, tramadol   07/31/2021 ALL: Emily Peck returns for follow up for peripheral neuropathy. She continues gabapentin 1200mg  TID. She was last seen 03/2020 and we attempted to get a topical compounded neuropathy cream covered. She was unable to afford. She talked to a rep about Qutenza but wasn't sure she could afford. She continues to have waxing and waning burning sensation of both feet. Dorsal area of foot bothers her more, recently. She also reports swelling of her feet over the past year. She is taking Lasix. Compression stockings rarely used as they tend to increase neuropathy pain. She is followed closely by Emily Peck with pain management.   03/29/2020 ALL:  She returns today for follow up for peripheral neuropathy on gabapentin. She continues 1200mg  TID. We ordered a compounded neuropathy cream at last visit but she reports that she  never heard back about this. I will resend orders today. Pain continues. Her feet burn all the time. She feels it is most bothersome at night. She has significant pain in the lower legs and they are very tender to touch. They stay red. She is followed by pain management on Tylenol $ but it does not help at all. She has consistent low back pain. She tries to be active but hurts all the time. She continues to have falls regularly. She feels it is related to not being able to see. She does use a cane at all times. She is able to navigate public transportation.   She was seen by Emily Emily Peck in 04/2019 for excessive daytime sleepiness. Sleep study was normal with no evidence of sleep disordered breathing. 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 She was encouraged to focus on sleep hygiene. Melatonin recommended. She has not noted any significant changes but admits that sleep schedule is not consistent.   03/30/2019 ALL: Emily Peck is a 67 y.o. female here today for follow up for peripheral neuropathy. She continues to have burning pain in bilateral lower extremities. She is taking gabapentin 1200mg  three times daily. She does feel that this help. Pain is better with rest, worse with activity. She denies changes in her gait. She does have a history of falls. She contributes this to vision loss. She has a prosthetic right eye and limited visual file with left eye. She is followed closely by PCP. She denies falls when she uses her cane.   HISTORY: (copied from Emily Peck's note on 10/15/2017)  UPDATE 8/14/2019CM Emily Peck, 67 year old female returns for follow-up with polyneuropathy in her feet for years which has progressed  over time.  It is a pins-and-needles sensation.  She is currently on gabapentin with benefit.  She continues to see Emily. Lottie Peck for chronic low back pain and lumbar medial branch blocks.  She has a appointment in September.  She claims the injections last about  6 months.  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  She has seen a specialist at [facility] that has said nothing else can be done for her vision.  She was encouraged to be safe with her ambulation. She ambulates with a single-point cane.  She returns for reevaluation   UPDATE 05/04/2018CM Emily Peck, 67 year old female returns for follow-up with history of polyneuropathy in her feet for years which has progressively worsened over time. She has a pins and needles burning in the feet, used to be only on the bottom of the feet balance on the top of the feet as well.She had right knee replacement November 2017. She is still in physical therapy. She continues to see EmilyKirsteins  for chronic low back pain and receives lumbar medial branch blocks. She has an appointment today. She says the injections last about 6 months. She continues to have lot of left leg pain due to her knee surgery. She denies any falls. She is ambulating with single-point cane. She returns for reevaluation   HISTORY 10/19/15 Emily Peck is a 67 y.o. female here as a referral from Emily Peck for neuropathy in the feet. PMHx of hyperlipidemia, chronic lumbar radiculopathy, polyneuropathy, obesity, thoracic compression fractures T10-T11 s/pT12-L1 fusion, depression, glaucoma, retinal detachment, macular degeneration and poor vision.. She has had neuropathy in the feet for years. Progressively worsening, continuous. Pins and needles, burning in the feet, it used to be under the bottom now it on the top also. She denies diabetes or using any medications that can cause neuropathy she has never been exposed to toxins that can cause neuropathy. Mother was diabetic and had neuropathy. Associated ith cramps in the feet. The burning is the worst. Worse at night. The gabapentin helps. She takes 600mg  4x a day. Continous all day. Unknown inciting events. No medications that  can induce neuropathy. No alcohol use. She takes 600mg  neurontin 4x a day.    REVIEW OF SYSTEMS: Out of a complete 14 system review of symptoms, the patient complains only of the following symptoms, neuropathy, chronic pain, vision loss and all other reviewed systems are negative.   ALLERGIES: Allergies  Allergen Reactions   Codeine     Other reaction(s): itching Other reaction(s): itching   No Known Allergies     HOME MEDICATIONS: Outpatient Medications Prior to Visit  Medication Sig Dispense Refill   Ascorbic Acid (VITAMIN C PO) Take 1 tablet by mouth daily.     B Complex Vitamins (VITAMIN B COMPLEX PO) Take 1 tablet by mouth daily.     Camphor-Eucalyptus-Menthol (VICKS VAPORUB EX) Apply topically. Uses on feet prn for pain  (family dollar brand)     cyclobenzaprine (FLEXERIL) 5 MG tablet Take 5 mg by mouth at bedtime as needed.     diazepam (VALIUM) 10 MG tablet diazepam 10 mg tablet  TAKE 1 TABLET BY MOUTH 30 MINS BEFORE PROCEDURE     diphenhydrAMINE (BENADRYL) 25 MG tablet Take 25-50 mg by mouth 4 (four) times daily as needed for itching. Takes with Tylenol #4 due to codeine allergy.     enoxaparin (LOVENOX) 100 MG/ML injection Inject  1 syringe into the skin every 12 (twelve) hours for 5 days. 10 mL 0   escitalopram (LEXAPRO) 20 MG tablet Take 20 mg by mouth at bedtime.   0   FEROSUL 325 (65 Fe) MG tablet Take 325 mg by mouth daily.     fluticasone (FLONASE) 50 MCG/ACT nasal spray Place 1 spray into both nostrils in the morning and at bedtime.     furosemide (LASIX) 20 MG tablet Take 1 tablet (20 mg total) by mouth daily for 7 days. 30 tablet 0   gabapentin (NEURONTIN) 600 MG tablet Take 2 tablets (1,200 mg total) by mouth 3 (three) times daily. 540 tablet 3   HYDROcodone-acetaminophen (NORCO) 5-325 MG tablet Take 1 tablet by mouth 2 (two) times daily as needed for moderate pain. 60 tablet 0   ibuprofen (ADVIL) 800 MG tablet ibuprofen 800 mg tablet  TAKE 1 TABLET BY MOUTH TWICE  DAILY     ketoconazole (NIZORAL) 2 % cream ketoconazole 2 % topical cream  APPLY TWICE DAILY     Lido-Capsaicin-Men-Methyl Sal 0.5-0.035-5-20 % PTCH Place one patch on the bottom of each foot every night at bedtime 60 patch 11   methocarbamol (ROBAXIN) 500 MG tablet Take 1 tablet (500 mg total) by mouth every 8 (eight) hours as needed for muscle spasms. 45 tablet 0   neomycin-polymyxin-dexamethasone (MAXITROL) 0.1 % ophthalmic suspension neomycin-polymyxin-dexameth 3.5 mg/mL-10,000 unit/mL-0.1% eye drops  SHAKE LIQUID AND INSTILL 1 DROP IN RIGHT EYE TWICE DAILY     pantoprazole (PROTONIX) 40 MG tablet Take 1 tablet (40 mg total) by mouth 2 (two) times daily. 60 tablet 0   potassium chloride (KLOR-CON M) 10 MEQ tablet Take 10 mEq by mouth 1 day or 1 dose.     potassium chloride (KLOR-CON) 10 MEQ tablet Take 10 mEq by mouth daily.     Propylene Glycol 0.6 % SOLN Place 1 drop into both eyes as needed (dry eyes).     sodium chloride (OCEAN) 0.65 % SOLN nasal spray Place 1 spray into both nostrils as needed for congestion.     topiramate (TOPAMAX) 200 MG tablet Take 200 mg by mouth at bedtime.     traZODone (DESYREL) 150 MG tablet Take 150 mg by mouth at bedtime.  0   VITAMIN D PO Take 1 tablet by mouth daily.     warfarin (COUMADIN) 6 MG tablet Take 6-9 mg by mouth daily. Take 9mg  Monday, Wednesday, and Fridays, then all other days take 6mg s daily     No facility-administered medications prior to visit.    PAST MEDICAL HISTORY: Past Medical History:  Diagnosis Date   Anxiety    Clotting disorder (HCC)    Depression    Dyspnea    GERD (gastroesophageal reflux disease)    Headache    Lumbosacral spondylosis without myelopathy    Neuropathy    Postlaminectomy syndrome, lumbar region    Postlaminectomy syndrome, thoracic region    Stroke [facility])     PAST SURGICAL HISTORY: Past Surgical History:  Procedure Laterality Date   ABDOMINAL HYSTERECTOMY     BACK SURGERY     BIOPSY  10/02/2020    Procedure: BIOPSY;  Surgeon: Charna Elizabeth, MD;  Location: [facility] ENDOSCOPY;  Service: Endoscopy;;   COLONOSCOPY WITH PROPOFOL N/A 10/02/2020   Procedure: COLONOSCOPY WITH PROPOFOL;  Surgeon: Charna Elizabeth, MD;  Location: [facility] ENDOSCOPY;  Service: Endoscopy;  Laterality: N/A;   ESOPHAGOGASTRODUODENOSCOPY (EGD) WITH PROPOFOL N/A 10/02/2020   Procedure: ESOPHAGOGASTRODUODENOSCOPY (EGD) WITH PROPOFOL;  Surgeon:  Charna Elizabeth, MD;  Location: [facility] ENDOSCOPY;  Service: Endoscopy;  Laterality: N/A;   EYE SURGERY     eye removed right    GIVENS CAPSULE STUDY N/A 10/02/2020   Procedure: GIVENS CAPSULE STUDY;  Surgeon: Charna Elizabeth, MD;  Location: [facility] ENDOSCOPY;  Service: Endoscopy;  Laterality: N/A;   KNEE ARTHROSCOPY     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   Procedure: TOTAL KNEE ARTHROPLASTY;  Surgeon: Marcene Corning, MD;  Location: [facility] OR;  Service: Orthopedics;  Laterality: Right;    FAMILY HISTORY: Family History  Problem Relation Age of Onset   Cancer Mother    Diabetes Mother    Migraines Sister    Migraines Brother     SOCIAL HISTORY: Social History   Socioeconomic History   Marital status: Single    Spouse name: Not on file   Number of children: 2   Years of education: Not on file   Highest education level: Not on file  Occupational History   Not on file  Tobacco Use   Smoking status: Never   Smokeless tobacco: Never  Vaping Use   Vaping Use: Never used  Substance and Sexual Activity   Alcohol use: No   Drug use: No   Sexual activity: Not on file  Other Topics Concern   Not on file  Social History Narrative   Not on file   Social Determinants of Health   Financial Resource Strain: Not on file  Food Insecurity: Not on file  Transportation Needs: Not on file  Physical Activity: Not on file  Stress: Not on file  Social Connections: Not on file  Intimate Partner Violence: Not on file    PHYSICAL EXAM  There were no vitals filed for this visit.   There is no height or  weight on file to calculate BMI.  Generalized: Well developed, in no acute distress  Cardiology: normal rate and rhythm, no murmur noted Respiratory: clear to auscultation bilaterally  Neurological examination  Mentation: Alert oriented to time, place, history taking. Follows all commands speech and language fluent Cranial nerve II-XII: Pupils were equal round reactive to light. Left Extraocular movements were full, right prosthesis, peripheral field loss of left eye.  Motor: The motor testing reveals 4+ over 5 strength of all 4 extremities. Good symmetric motor tone is noted throughout.  Sensory: Sensory testing is intact to soft touch on all 4 extremities. No evidence of extinction is noted. Pinprick testing intact bilaterally with exception of decreased sensation of heel, thickened skin noted of heels.  Coordination: Cerebellar testing reveals good finger-nose-finger and heel-to-shin bilaterally.  Gait and station: Gait is stable with single prong cane, slight right limp noted   Skin: bruising noted to bilateral shins from accident at [employer] where she walked into a flat bed. No open wounds or obvious signs of infection.   DIAGNOSTIC DATA (LABS, IMAGING, TESTING) - I reviewed patient records, labs, notes, testing and imaging myself where available.      No data to display           Lab Results  Component Value Date   WBC 6.9 01/01/2021   HGB 11.4 (L) 01/01/2021   HCT 37.3 01/01/2021   MCV 100.0 01/01/2021   PLT 217 01/01/2021      Component Value Date/Time   NA 137 01/01/2021 0954   K 3.8 01/01/2021 0954   CL 108 01/01/2021 0954   CO2 23 01/01/2021 0954   GLUCOSE 76 01/01/2021 0954   BUN 15  01/01/2021 0954   CREATININE 0.86 01/01/2021 0954   CALCIUM 9.0 01/01/2021 0954   PROT 6.6 09/30/2020 1050   PROT 6.9 04/19/2019 1059   ALBUMIN 3.2 (L) 09/30/2020 1050   AST 18 09/30/2020 1050   ALT 14 09/30/2020 1050   ALKPHOS 71 09/30/2020 1050   BILITOT 0.4 09/30/2020 1050    GFRNONAA >60 01/01/2021 0954   GFRAA >60 02/25/2018 1329   Lab Results  Component Value Date   CHOL  07/11/2009    180        ATP III CLASSIFICATION:  <200     mg/dL   Desirable  161-096  mg/dL   Borderline High  >=045    mg/dL   High          HDL 66 07/11/2009   LDLCALC (H) 07/11/2009    103        Total Cholesterol/HDL:CHD Risk Coronary Heart Disease Risk Table                     Men   Women  1/2 Average Risk   3.4   3.3  Average Risk       5.0   4.4  2 X Average Risk   9.6   7.1  3 X Average Risk  23.4   11.0        Use the calculated Patient Ratio above and the CHD Risk Table to determine the patient's CHD Risk.        ATP III CLASSIFICATION (LDL):  <100     mg/dL   Optimal  409-811  mg/dL   Near or Above                    Optimal  130-159  mg/dL   Borderline  914-782  mg/dL   High  >956     mg/dL   Very High   TRIG 53 21/30/8657   CHOLHDL 2.7 07/11/2009   Lab Results  Component Value Date   HGBA1C 5.4 04/19/2019   Lab Results  Component Value Date   VITAMINB12 4,770 (H) 09/30/2020      ASSESSMENT AND PLAN 67 y.o. year old female  has a past medical history of Anxiety, Clotting disorder (HCC), Depression, Dyspnea, GERD (gastroesophageal reflux disease), Headache, Lumbosacral spondylosis without myelopathy, Neuropathy, Postlaminectomy syndrome, lumbar region, Postlaminectomy syndrome, thoracic region, and Stroke (HCC). here with   No diagnosis found.    Emily Peck continues to have significant burning pain of bilateral feet. She does not feel symptoms are any worse and are improved with gabapentin. She is currently at max dose of gabapentin. No obvious adverse effects noted but we have reviewed potential for increased dizziness and sedation. She continues chronic pain medications as well and we will monitor closely. She is followed closely by PCP . No recent falls. I will send notes to pain management to see if they offer Qutenza therapy. She will continue  close follow up with PCP for edema. Healthy lifestyle habits encouraged. She will call for any worsening and follow up with me in 1 year. She verbalizes understanding and agreement with this plan.    No orders of the defined types were placed in this encounter.    No orders of the defined types were placed in this encounter.    Shawnie Dapper, FNP-C 07/30/2022, 9:42 AM [facility] [address], [address] [place], [place] [address] [phone]

## 2022-07-30 NOTE — Patient Instructions (Signed)

## 2022-08-01 ENCOUNTER — Encounter: Payer: Self-pay | Admitting: Family Medicine

## 2022-08-01 ENCOUNTER — Ambulatory Visit (INDEPENDENT_AMBULATORY_CARE_PROVIDER_SITE_OTHER): Payer: Medicare HMO | Admitting: Family Medicine

## 2022-08-01 VITALS — BP 90/56 | HR 65 | Ht 63.5 in | Wt 203.5 lb

## 2022-08-01 DIAGNOSIS — G8929 Other chronic pain: Secondary | ICD-10-CM | POA: Diagnosis not present

## 2022-08-01 DIAGNOSIS — G629 Polyneuropathy, unspecified: Secondary | ICD-10-CM | POA: Diagnosis not present

## 2022-08-01 DIAGNOSIS — M542 Cervicalgia: Secondary | ICD-10-CM

## 2022-08-01 MED ORDER — GABAPENTIN 600 MG PO TABS: 1200.0000 mg | ORAL_TABLET | Freq: Three times a day (TID) | ORAL | 3 refills | Status: AC

## 2023-03-12 ENCOUNTER — Other Ambulatory Visit: Payer: Self-pay

## 2023-03-12 ENCOUNTER — Emergency Department (HOSPITAL_COMMUNITY)
Admission: EM | Admit: 2023-03-12 | Discharge: 2023-03-12 | Disposition: A | Discharge status: Home / Self Care | Payer: Medicare HMO | Attending: Emergency Medicine | Admitting: Emergency Medicine

## 2023-03-12 ENCOUNTER — Emergency Department (HOSPITAL_COMMUNITY): Payer: Medicare HMO

## 2023-03-12 ENCOUNTER — Encounter (HOSPITAL_COMMUNITY): Payer: Self-pay

## 2023-03-12 DIAGNOSIS — Z8673 Personal history of transient ischemic attack (TIA), and cerebral infarction without residual deficits: Secondary | ICD-10-CM | POA: Diagnosis not present

## 2023-03-12 DIAGNOSIS — W0110XA Fall on same level from slipping, tripping and stumbling with subsequent striking against unspecified object, initial encounter: Secondary | ICD-10-CM | POA: Diagnosis not present

## 2023-03-12 DIAGNOSIS — Z7901 Long term (current) use of anticoagulants: Secondary | ICD-10-CM | POA: Diagnosis not present

## 2023-03-12 DIAGNOSIS — S0990XA Unspecified injury of head, initial encounter: Secondary | ICD-10-CM | POA: Insufficient documentation

## 2023-03-12 DIAGNOSIS — M25511 Pain in right shoulder: Secondary | ICD-10-CM | POA: Diagnosis not present

## 2023-03-12 DIAGNOSIS — R42 Dizziness and giddiness: Secondary | ICD-10-CM | POA: Diagnosis present

## 2023-03-12 DIAGNOSIS — W19XXXA Unspecified fall, initial encounter: Secondary | ICD-10-CM

## 2023-03-12 LAB — I-STAT CHEM 8, ED
BUN: 11 mg/dL (ref 8–23)
Calcium, Ion: 1.12 mmol/L — ABNORMAL LOW (ref 1.15–1.40)
Chloride: 110 mmol/L (ref 98–111)
Creatinine, Ser: 0.8 mg/dL (ref 0.44–1.00)
Glucose, Bld: 106 mg/dL — ABNORMAL HIGH (ref 70–99)
HCT: 37 % (ref 36.0–46.0)
Hemoglobin: 12.6 g/dL (ref 12.0–15.0)
Potassium: 3.6 mmol/L (ref 3.5–5.1)
Sodium: 139 mmol/L (ref 135–145)
TCO2: 20 mmol/L — ABNORMAL LOW (ref 22–32)

## 2023-03-12 LAB — COMPREHENSIVE METABOLIC PANEL
ALT: 16 U/L (ref 0–44)
AST: 17 U/L (ref 15–41)
Albumin: 3 g/dL — ABNORMAL LOW (ref 3.5–5.0)
Alkaline Phosphatase: 73 U/L (ref 38–126)
Anion gap: 8 (ref 5–15)
BUN: 11 mg/dL (ref 8–23)
CO2: 20 mmol/L — ABNORMAL LOW (ref 22–32)
Calcium: 8.8 mg/dL — ABNORMAL LOW (ref 8.9–10.3)
Chloride: 109 mmol/L (ref 98–111)
Creatinine, Ser: 0.86 mg/dL (ref 0.44–1.00)
GFR, Estimated: >60 mL/min (ref >60)
Glucose, Bld: 111 mg/dL — ABNORMAL HIGH (ref 70–99)
Potassium: 3.5 mmol/L (ref 3.5–5.1)
Sodium: 137 mmol/L (ref 135–145)
Total Bilirubin: 0.2 mg/dL (ref 0.0–1.2)
Total Protein: 6.9 g/dL (ref 6.5–8.1)

## 2023-03-12 LAB — CBC
HCT: 38 % (ref 36.0–46.0)
Hemoglobin: 12.1 g/dL (ref 12.0–15.0)
MCH: 32.3 pg (ref 26.0–34.0)
MCHC: 31.8 g/dL (ref 30.0–36.0)
MCV: 101.3 fL — ABNORMAL HIGH (ref 80.0–100.0)
Platelets: 271 10*3/uL (ref 150–400)
RBC: 3.75 MIL/uL — ABNORMAL LOW (ref 3.87–5.11)
RDW: 13.1 % (ref 11.5–15.5)
WBC: 9 10*3/uL (ref 4.0–10.5)
nRBC: 0 % (ref 0.0–0.2)

## 2023-03-12 LAB — PROTIME-INR
INR: 2.4 — ABNORMAL HIGH (ref 0.8–1.2)
Prothrombin Time: 26.5 s — ABNORMAL HIGH (ref 11.4–15.2)

## 2023-03-12 MED ORDER — OXYCODONE HCL 5 MG PO TABS
5.0000 mg | ORAL_TABLET | Freq: Once | ORAL | Status: AC
  Administered 2023-03-12: 5 mg via ORAL
  Filled 2023-03-12: qty 1

## 2023-03-12 MED ORDER — HYDROMORPHONE HCL 1 MG/ML IJ SOLN
1.0000 mg | Freq: Once | INTRAMUSCULAR | Status: AC
  Administered 2023-03-12: 1 mg via INTRAVENOUS

## 2023-03-12 MED ORDER — HYDROMORPHONE HCL 1 MG/ML IJ SOLN
1.0000 mg | Freq: Once | INTRAMUSCULAR | Status: DC
Start: 2023-03-12 — End: 2023-03-12
  Filled 2023-03-12: qty 1

## 2023-03-12 MED ORDER — HYDROMORPHONE HCL 1 MG/ML IJ SOLN: 0.5000 mg | Freq: Once | INTRAMUSCULAR | Status: DC

## 2023-03-12 NOTE — ED Provider Notes (Signed)
 [facility] Emergency Department Provider Note MRN:  [id number]  Arrival date & time: 03/12/23     Chief Complaint   Fall History of Present Illness   Emily Peck is a 68 y.o. year-old female with a history of stroke presenting to the ED with chief complaint of fall.  Patient explains that she has a long history of vertigo and had a spell of vertigo at about 4 PM causing her to lose her balance and fall.  Fell onto her right shoulder and hit her head.  Endorsing continued significant pain to the right shoulder.  Denies loss of consciousness, no nausea or vomiting, mild neck pain, no back pain, no chest pain or shortness of breath, no abdominal pain, no numbness or weakness to the arms or legs, no leg injuries.  Level 2 trauma, takes blood thinners.  Review of Systems  A thorough review of systems was obtained and all systems are negative except as noted in the HPI and PMH.   Patient's Health History    Past Medical History:  Diagnosis Date   Anxiety    Clotting disorder (HCC)    Depression    Dyspnea    GERD (gastroesophageal reflux disease)    Headache    Lumbosacral spondylosis without myelopathy    Neuropathy    Postlaminectomy syndrome, lumbar region    Postlaminectomy syndrome, thoracic region    Stroke [facility])     Past Surgical History:  Procedure Laterality Date   ABDOMINAL HYSTERECTOMY     BACK SURGERY     BIOPSY  10/02/2020   Procedure: BIOPSY;  Surgeon: Kristie Lamprey, MD;  Location: [facility] ENDOSCOPY;  Service: Endoscopy;;   COLONOSCOPY WITH PROPOFOL  N/A 10/02/2020   Procedure: COLONOSCOPY WITH PROPOFOL ;  Surgeon: Kristie Lamprey, MD;  Location: [facility] ENDOSCOPY;  Service: Endoscopy;  Laterality: N/A;   ESOPHAGOGASTRODUODENOSCOPY (EGD) WITH PROPOFOL  N/A 10/02/2020   Procedure: ESOPHAGOGASTRODUODENOSCOPY (EGD) WITH PROPOFOL ;  Surgeon: Kristie Lamprey, MD;  Location: [facility] ENDOSCOPY;  Service: Endoscopy;  Laterality: N/A;   EYE SURGERY     eye removed right     GIVENS CAPSULE STUDY N/A 10/02/2020   Procedure: GIVENS CAPSULE STUDY;  Surgeon: Kristie Lamprey, MD;  Location: [facility] ENDOSCOPY;  Service: Endoscopy;  Laterality: N/A;   KNEE ARTHROSCOPY     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   Procedure: TOTAL KNEE ARTHROPLASTY;  Surgeon: Maude Herald, MD;  Location: [facility] OR;  Service: Orthopedics;  Laterality: Right;    Family History  Problem Relation Age of Onset   Cancer Mother    Diabetes Mother    Migraines Sister    Migraines Brother     Social History   Socioeconomic History   Marital status: Single    Spouse name: Not on file   Number of children: 2   Years of education: Not on file   Highest education level: Not on file  Occupational History   Not on file  Tobacco Use   Smoking status: Never   Smokeless tobacco: Never  Vaping Use   Vaping status: Never Used  Substance and Sexual Activity   Alcohol  use: No   Drug use: No   Sexual activity: Not on file  Other Topics Concern   Not on file  Social History Narrative   Not on file   Social Drivers of Health   Financial Resource Strain: Not on file  Food Insecurity: Not on file  Transportation Needs: Not on file  Physical Activity: Not on file  Stress:  Not on file  Social Connections: Unknown (10/11/2021)   Received from [facility]   Social Network    Social Network: Not on file  Intimate Partner Violence: Unknown (10/11/2021)   Received from [facility]   HITS    Physically Hurt: Not on file    Insult or Talk Down To: Not on file    Threaten Physical Harm: Not on file    Scream or Curse: Not on file     Physical Exam   Vitals:   03/12/23 0151 03/12/23 0400  BP: 115/72 109/63  Pulse: 72 69  Resp: 17 12  Temp: 97.9 F (36.6 C)   SpO2: 97% 98%    CONSTITUTIONAL: Well-appearing, NAD NEURO/PSYCH:  Alert and oriented x 3, normal and symmetric strength and sensation, normal coordination, normal speech, motor exam of the right arm  limited due to pain EYES:  eyes equal and reactive ENT/NECK:  no LAD, no JVD CARDIO: Regular rate, well-perfused, normal S1 and S2 PULM:  CTAB no wheezing or rhonchi GI/GU:  non-distended, non-tender MSK/SPINE:  No gross deformities, no edema, reduced range of motion of the right shoulder due to pain, neurovascularly intact SKIN:  no rash, atraumatic   *Additional and/or pertinent findings included in MDM below  Diagnostic and Interventional Summary    EKG Interpretation Date/Time:  Wednesday March 12 2023 00:21:20 EST Ventricular Rate:  69 PR Interval:  152 QRS Duration:  70 QT Interval:  422 QTC Calculation: 452 R Axis:   24  Text Interpretation: Normal sinus rhythm Low voltage QRS Borderline ECG When compared with ECG of 01-Jan-2021 09:41, PREVIOUS ECG IS PRESENT Confirmed by Theadore Sharper [phone]) on 03/12/2023 4:42:45 AM       Labs Reviewed  CBC - Abnormal; Notable for the following components:      Result Value   RBC 3.75 (*)    MCV 101.3 (*)    All other components within normal limits  COMPREHENSIVE METABOLIC PANEL - Abnormal; Notable for the following components:   CO2 20 (*)    Glucose, Bld 111 (*)    Calcium  8.8 (*)    Albumin 3.0 (*)    All other components within normal limits  PROTIME-INR - Abnormal; Notable for the following components:   Prothrombin Time 26.5 (*)    INR 2.4 (*)    All other components within normal limits  I-STAT CHEM 8, ED - Abnormal; Notable for the following components:   Glucose, Bld 106 (*)    Calcium , Ion 1.12 (*)    TCO2 20 (*)    All other components within normal limits    CT HEAD WO CONTRAST ( )  Final Result    CT CERVICAL SPINE WO CONTRAST  Final Result    DG Chest Port 1 View  Final Result    DG Forearm Right  Final Result    DG Humerus Right  Final Result      Medications  HYDROmorphone  (DILAUDID ) injection 1 mg (1 mg Intravenous Given 03/12/23 0029)  oxyCODONE  (Oxy IR/ROXICODONE ) immediate release  tablet 5 mg (5 mg Oral Given 03/12/23 0427)     Procedures  /  Critical Care Procedures  ED Course and Medical Decision Making  Initial Impression and Ddx Vertigo causing fall.  Not having any vertigo or dizziness currently, reassuring neurological exam, highly doubt central vertigo especially given patient's chronic history of what sounds like peripheral vertigo.  Having a lot of shoulder pain question fracture versus dislocation awaiting  x-rays.  Hit her head and on blood thinners, will need CT to exclude intracranial bleeding.  CT neck to exclude cervical spinal fracture.  Otherwise a reassuring exam without abdominal tenderness, no chest pain or shortness of breath, highly doubt significant intrathoracic or intra-abdominal injury.  Past medical/surgical history that increases complexity of ED encounter: History of stroke, blood thinners  Interpretation of Diagnostics I personally reviewed the EKG and my interpretation is as follows: Sinus rhythm  Labs without significant blood count or electrolyte disturbance.  Patient Reassessment and Ultimate Disposition/Management     CT and x-ray imaging is without acute traumatic injury.  Patient continues to have some shoulder discomfort, suspect the fall has caused a flare of her arthritis.  Neurovascularly intact extremity without obvious external trauma.  Will place in sling for comfort, no indication for further testing or admission, appropriate for discharge.  Patient management required discussion with the following services or consulting groups:  None  Complexity of Problems Addressed Acute illness or injury that poses threat of life of bodily function  Additional Data Reviewed and Analyzed Further history obtained from: None  Additional Factors Impacting ED Encounter Risk Consideration of hospitalization  Ozell HERO. Theadore, MD [facility] Emergency Medicine [facility] [email or web address]  Final Clinical  Impressions(s) / ED Diagnoses     ICD-10-CM   1. Fall, initial encounter  W19.XXXA     2. Acute pain of right shoulder  M25.511     3. Traumatic injury of head, initial encounter  S09.90XA     4. Anticoagulated  Z79.01       ED Discharge Orders     None        Discharge Instructions Discussed with and Provided to Patient:     Discharge Instructions      You were evaluated in the Emergency Department and after careful evaluation, we did not find any emergent condition requiring admission or further testing in the hospital.  Your exam/testing today is overall reassuring.  CTs and x-rays did not show any significant injuries.  Recommend follow-up with your regular doctors.  Use Tylenol  at home for discomfort.  Please return to the Emergency Department if you experience any worsening of your condition.   Thank you for allowing us  to be a part of your care.       Theadore Ozell HERO, MD 03/12/23 [phone]

## 2023-03-12 NOTE — Discharge Instructions (Signed)
 You were evaluated in the Emergency Department and after careful evaluation, we did not find any emergent condition requiring admission or further testing in the hospital.  Your exam/testing today is overall reassuring.  CTs and x-rays did not show any significant injuries.  Recommend follow-up with your regular doctors.  Use Tylenol  at home for discomfort.  Please return to the Emergency Department if you experience any worsening of your condition.   Thank you for allowing us  to be a part of your care.

## 2023-03-12 NOTE — ED Notes (Signed)
 Trauma Response Nurse Documentation   Emily Peck is a 68 y.o. female arriving to [facility] ED via EMS  On warfarin daily. Trauma was activated as a Level 2 by EDP based on the following trauma criteria Elderly patients > 65 with head trauma on anti-coagulation (excluding ASA).  Patient cleared for CT by Dr. Theadore EDP. Pt transported to CT with trauma response nurse present to monitor. RN remained with the patient throughout their absence from the department for clinical observation.   GCS 15.   History   Past Medical History:  Diagnosis Date   Anxiety    Clotting disorder (HCC)    Depression    Dyspnea    GERD (gastroesophageal reflux disease)    Headache    Lumbosacral spondylosis without myelopathy    Neuropathy    Postlaminectomy syndrome, lumbar region    Postlaminectomy syndrome, thoracic region    Stroke [facility])      Past Surgical History:  Procedure Laterality Date   ABDOMINAL HYSTERECTOMY     BACK SURGERY     BIOPSY  10/02/2020   Procedure: BIOPSY;  Surgeon: Kristie Lamprey, MD;  Location: [facility] ENDOSCOPY;  Service: Endoscopy;;   COLONOSCOPY WITH PROPOFOL  N/A 10/02/2020   Procedure: COLONOSCOPY WITH PROPOFOL ;  Surgeon: Kristie Lamprey, MD;  Location: [facility] ENDOSCOPY;  Service: Endoscopy;  Laterality: N/A;   ESOPHAGOGASTRODUODENOSCOPY (EGD) WITH PROPOFOL  N/A 10/02/2020   Procedure: ESOPHAGOGASTRODUODENOSCOPY (EGD) WITH PROPOFOL ;  Surgeon: Kristie Lamprey, MD;  Location: [facility] ENDOSCOPY;  Service: Endoscopy;  Laterality: N/A;   EYE SURGERY     eye removed right    GIVENS CAPSULE STUDY N/A 10/02/2020   Procedure: GIVENS CAPSULE STUDY;  Surgeon: Kristie Lamprey, MD;  Location: [facility] ENDOSCOPY;  Service: Endoscopy;  Laterality: N/A;   KNEE ARTHROSCOPY     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   Procedure: TOTAL KNEE ARTHROPLASTY;  Surgeon: Maude Herald, MD;  Location: [facility] OR;  Service: Orthopedics;  Laterality: Right;       Initial Focused Assessment (If applicable, or please see trauma  documentation): Alert/oriented female presents via EMS from home after a fall approx 1630 this afternoon, reports hitting her right shoulder and then right head. Takes coumadin . No obvious trauma noted, right shoulder increase in pain to movement.  Airway patent/unobstructed, BS clear No obvious uncontrolled hemorrhage GCS 15 right eye removed, left eye pupil round reactive to light brisk 3  CT's Completed:   CT Head and CT C-Spine   Interventions:  IV start and trauma lab draw Portable chest and right forearm, humerus XRAY CT head, c-spine  Dilaudid  for pain control EKG  Plan for disposition:  Discharge home anticipated  Consults completed:  None  Event Summary: Presents via EMS from home after a fall earlier in the day with right shoulder pain, right head pain. States no LOC. No obvious trauma noted. CMS intact. Trauma scans unremarkable.   MTP Summary (If applicable): NA  Bedside handoff with ED RN Tiffany.    Abdullah Rizzi O Shirleymae Hauth  Trauma Response RN  Please call TRN at [phone] for further assistance.

## 2023-03-12 NOTE — ED Triage Notes (Signed)
 Pt BIB [facility] d/t FOT.  It became a Level 2 at triage.  Pt fell on Right Shoulder but then secondarily hit head.  Pt is on coumadin. Pt states she got dizzy - hx of vertigo.

## 2023-03-12 NOTE — Progress Notes (Signed)
 Orthopedic Tech Progress Note Patient Details:  Emily Peck 1956/01/04 [id number]  Patient ID: Emily Peck, female   DOB: 04/18/1955, 68 y.o.   MRN: [id number] Level II; not currently needed. Emily Peck 03/12/2023, 1:10 AM

## 2023-04-09 ENCOUNTER — Other Ambulatory Visit: Payer: Self-pay | Admitting: Nurse Practitioner

## 2023-04-09 DIAGNOSIS — Z1231 Encounter for screening mammogram for malignant neoplasm of breast: Secondary | ICD-10-CM

## 2023-05-01 ENCOUNTER — Ambulatory Visit: Payer: Medicare HMO

## 2023-05-08 ENCOUNTER — Ambulatory Visit
Admission: RE | Admit: 2023-05-08 | Discharge: 2023-05-08 | Disposition: A | Discharge status: Home / Self Care | Payer: Medicare HMO | Source: Ambulatory Visit | Attending: Nurse Practitioner | Admitting: Nurse Practitioner

## 2023-05-08 DIAGNOSIS — Z1231 Encounter for screening mammogram for malignant neoplasm of breast: Secondary | ICD-10-CM

## 2023-05-14 ENCOUNTER — Other Ambulatory Visit: Payer: Self-pay | Admitting: Nurse Practitioner

## 2023-05-14 DIAGNOSIS — R928 Other abnormal and inconclusive findings on diagnostic imaging of breast: Secondary | ICD-10-CM

## 2023-05-21 ENCOUNTER — Ambulatory Visit
Admission: RE | Admit: 2023-05-21 | Discharge: 2023-05-21 | Disposition: A | Discharge status: Home / Self Care | Source: Ambulatory Visit | Attending: Nurse Practitioner | Admitting: Nurse Practitioner

## 2023-05-21 DIAGNOSIS — R928 Other abnormal and inconclusive findings on diagnostic imaging of breast: Secondary | ICD-10-CM

## 2023-08-05 ENCOUNTER — Telehealth: Payer: Self-pay | Admitting: Family Medicine

## 2023-08-05 NOTE — Telephone Encounter (Signed)
 Request to r/s appointment

## 2023-08-07 ENCOUNTER — Ambulatory Visit: Payer: Medicare HMO | Admitting: Family Medicine

## 2023-08-24 ENCOUNTER — Emergency Department (HOSPITAL_COMMUNITY)

## 2023-08-24 ENCOUNTER — Emergency Department (HOSPITAL_COMMUNITY)
Admission: EM | Admit: 2023-08-24 | Discharge: 2023-08-24 | Disposition: A | Discharge status: Home / Self Care | Attending: Emergency Medicine | Admitting: Emergency Medicine

## 2023-08-24 ENCOUNTER — Other Ambulatory Visit: Payer: Self-pay

## 2023-08-24 DIAGNOSIS — R0789 Other chest pain: Secondary | ICD-10-CM | POA: Diagnosis present

## 2023-08-24 DIAGNOSIS — R072 Precordial pain: Secondary | ICD-10-CM | POA: Diagnosis not present

## 2023-08-24 LAB — BASIC METABOLIC PANEL WITH GFR
Anion gap: 9 (ref 5–15)
BUN: 10 mg/dL (ref 8–23)
CO2: 17 mmol/L — ABNORMAL LOW (ref 22–32)
Calcium: 8.2 mg/dL — ABNORMAL LOW (ref 8.9–10.3)
Chloride: 110 mmol/L (ref 98–111)
Creatinine, Ser: 0.81 mg/dL (ref 0.44–1.00)
GFR, Estimated: >60 mL/min (ref >60)
Glucose, Bld: 105 mg/dL — ABNORMAL HIGH (ref 70–99)
Potassium: 3.7 mmol/L (ref 3.5–5.1)
Sodium: 136 mmol/L (ref 135–145)

## 2023-08-24 LAB — CBC WITH DIFFERENTIAL/PLATELET
Abs Immature Granulocytes: 0.02 10*3/uL (ref 0.00–0.07)
Basophils Absolute: 0.1 10*3/uL (ref 0.0–0.1)
Basophils Relative: 1 %
Eosinophils Absolute: 0.2 10*3/uL (ref 0.0–0.5)
Eosinophils Relative: 3 %
HCT: 35.9 % — ABNORMAL LOW (ref 36.0–46.0)
Hemoglobin: 11.6 g/dL — ABNORMAL LOW (ref 12.0–15.0)
Immature Granulocytes: 0 %
Lymphocytes Relative: 34 %
Lymphs Abs: 2.6 10*3/uL (ref 0.7–4.0)
MCH: 32.6 pg (ref 26.0–34.0)
MCHC: 32.3 g/dL (ref 30.0–36.0)
MCV: 100.8 fL — ABNORMAL HIGH (ref 80.0–100.0)
Monocytes Absolute: 0.9 10*3/uL (ref 0.1–1.0)
Monocytes Relative: 11 %
Neutro Abs: 4 10*3/uL (ref 1.7–7.7)
Neutrophils Relative %: 51 %
Platelets: 231 10*3/uL (ref 150–400)
RBC: 3.56 MIL/uL — ABNORMAL LOW (ref 3.87–5.11)
RDW: 12.5 % (ref 11.5–15.5)
WBC: 7.8 10*3/uL (ref 4.0–10.5)
nRBC: 0 % (ref 0.0–0.2)

## 2023-08-24 LAB — TROPONIN I (HIGH SENSITIVITY)
Troponin I (High Sensitivity): 4 ng/L (ref <18)
Troponin I (High Sensitivity): <2 ng/L (ref <18)

## 2023-08-24 MED ORDER — KETOROLAC TROMETHAMINE 30 MG/ML IJ SOLN
15.0000 mg | Freq: Once | INTRAMUSCULAR | Status: AC
  Administered 2023-08-24: 15 mg via INTRAVENOUS
  Filled 2023-08-24: qty 1

## 2023-08-24 MED ORDER — ALUM & MAG HYDROXIDE-SIMETH 200-200-20 MG/5ML PO SUSP
30.0000 mL | Freq: Once | ORAL | Status: AC
  Administered 2023-08-24: 30 mL via ORAL
  Filled 2023-08-24: qty 30

## 2023-08-24 MED ORDER — IOHEXOL 350 MG/ML SOLN
75.0000 mL | Freq: Once | INTRAVENOUS | Status: AC | PRN
  Administered 2023-08-24: 75 mL via INTRAVENOUS

## 2023-08-24 MED ORDER — SUCRALFATE 1 GM/10ML PO SUSP
1.0000 g | Freq: Three times a day (TID) | ORAL | 0 refills | Status: AC
Start: 2023-08-24 — End: ?

## 2023-08-24 NOTE — ED Triage Notes (Signed)
 Pt BIB [facility] from home c/o CP. CP intermittent for two weeks. Reports worsening CP yesterday around 11 pm and progressing this morning. Reports sharp/aching CP left sided radiating left arm. Associated SOB, dizziness, diaphoresis, and nausea.  EMS meds 324 ASA and NTG x1 with improvement of chest pain.

## 2023-08-24 NOTE — ED Provider Notes (Signed)
 [facility]PARTMENT AT [facility] Provider Note   CSN: [id number] Arrival date & time: 08/24/23  0153     Patient presents with: Chest Pain   Emily Peck is a 68 y.o. female.   The history is provided by the patient.  Chest Pain Pain location:  L chest Pain quality: sharp   Pain radiates to:  Does not radiate Pain severity:  Moderate Onset quality:  Gradual Duration:  2 weeks Timing:  Constant Progression:  Worsening Context: at rest   Relieved by:  Nothing Worsened by:  Nothing Associated symptoms: no back pain, no cough, no dizziness, no dysphagia, no fever, no headache, no shortness of breath, no syncope, no vomiting and no weakness        Prior to Admission medications   Medication Sig Start Date End Date Taking? Authorizing Provider  sucralfate (CARAFATE) 1 GM/10ML suspension Take 10 mLs (1 g total) by mouth 4 (four) times daily -  with meals and at bedtime. 08/24/23  Yes Shaughnessy Gethers, MD  Ascorbic Acid (VITAMIN C PO) Take 1 tablet by mouth daily.    [provider]  B Complex Vitamins (VITAMIN B COMPLEX PO) Take 1 tablet by mouth daily.    [provider]  Camphor-Eucalyptus-Menthol  (VICKS VAPORUB EX) Apply topically. Uses on feet prn for pain  (family [employer] brand)    [provider]  cyclobenzaprine  (FLEXERIL ) 5 MG tablet Take 5 mg by mouth at bedtime as needed. 06/27/21   [provider]  diazepam  (VALIUM ) 10 MG tablet diazepam  10 mg tablet  TAKE 1 TABLET BY MOUTH 30 MINS BEFORE PROCEDURE Patient not taking: Reported on 08/01/2022    [provider]  diphenhydrAMINE  (BENADRYL ) 25 MG tablet Take 25-50 mg by mouth 4 (four) times daily as needed for itching. Takes with Tylenol  #4 due to codeine  allergy.    [provider]  enoxaparin  (LOVENOX ) 100 MG/ML injection Inject 1 syringe into the skin every 12 (twelve) hours for 5 days. 10/04/20 10/12/20  Jinwala, Sagar H, MD  escitalopram   (LEXAPRO ) 20 MG tablet Take 20 mg by mouth at bedtime.  12/13/13   [provider]  FEROSUL 325 (65 Fe) MG tablet Take 325 mg by mouth daily. 01/31/21   [provider]  fluticasone (FLONASE) 50 MCG/ACT nasal spray Place 1 spray into both nostrils in the morning and at bedtime. 04/11/21   [provider]  furosemide  (LASIX ) 20 MG tablet Take 1 tablet (20 mg total) by mouth daily for 7 days. 05/10/20 11/11/20  Garrick Charleston, MD  gabapentin  (NEURONTIN ) 600 MG tablet Take 2 tablets (1,200 mg total) by mouth 3 (three) times daily. 08/01/22   Lomax, Amy, NP  HYDROcodone -acetaminophen  (NORCO) 5-325 MG tablet Take 1 tablet by mouth 2 (two) times daily as needed for moderate pain. 10/23/21   Kirsteins, Andrew E, MD  ibuprofen (ADVIL) 800 MG tablet ibuprofen 800 mg tablet  TAKE 1 TABLET BY MOUTH TWICE DAILY Patient not taking: Reported on 08/01/2022    [provider]  ketoconazole (NIZORAL) 2 % cream ketoconazole 2 % topical cream  APPLY TWICE DAILY Patient not taking: Reported on 08/01/2022    [provider]  Lido-Capsaicin -Men-Methyl Sal 0.5-0.035-5-20 % PTCH Place one patch on the bottom of each foot every night at bedtime Patient not taking: Reported on 08/01/2022 03/29/20   Lomax, Amy, NP  methocarbamol  (ROBAXIN ) 500 MG tablet Take 1 tablet (500 mg total) by mouth every 8 (eight) hours as needed  for muscle spasms. Patient not taking: Reported on 08/01/2022 05/15/21   Carilyn Prentice BRAVO, MD  neomycin-polymyxin-dexamethasone  (MAXITROL) 0.1 % ophthalmic suspension neomycin-polymyxin-dexameth 3.5 mg/mL-10,000 unit/mL-0.1% eye drops  SHAKE LIQUID AND INSTILL 1 DROP IN RIGHT EYE TWICE DAILY Patient not taking: Reported on 08/01/2022    [provider]  pantoprazole  (PROTONIX ) 40 MG tablet Take 1 tablet (40 mg total) by mouth 2 (two) times daily. 10/04/20   Jinwala, Sagar H, MD  potassium chloride  (KLOR-CON  M) 10 MEQ tablet Take 10 mEq by mouth 1 day or 1 dose.  03/23/21   [provider]  potassium chloride  (KLOR-CON ) 10 MEQ tablet Take 10 mEq by mouth daily. 01/04/19   [provider]  Propylene Glycol 0.6 % SOLN Place 1 drop into both eyes as needed (dry eyes).    [provider]  sodium chloride  (OCEAN) 0.65 % SOLN nasal spray Place 1 spray into both nostrils as needed for congestion.    [provider]  topiramate  (TOPAMAX ) 200 MG tablet Take 200 mg by mouth at bedtime. 03/30/20   [provider]  traZODone  (DESYREL ) 150 MG tablet Take 150 mg by mouth at bedtime. 07/13/14   [provider]  VITAMIN D PO Take 1 tablet by mouth daily.    [provider]  warfarin (COUMADIN ) 6 MG tablet Take 6-9 mg by mouth daily. Take 9mg  Monday, Wednesday, and Fridays, then all other days take 6mg s daily    [provider]    Allergies: Codeine  and No known allergies    Review of Systems  Constitutional:  Negative for fever.  HENT:  Negative for trouble swallowing.   Respiratory:  Negative for cough and shortness of breath.   Cardiovascular:  Positive for chest pain. Negative for syncope.  Gastrointestinal:  Negative for vomiting.  Musculoskeletal:  Negative for back pain.  Neurological:  Negative for dizziness, weakness and headaches.  All other systems reviewed and are negative.   Updated Vital Signs BP 100/67   Pulse 62   Temp 97.8 F (36.6 C) (Oral)   Resp 12   SpO2 100%   Physical Exam Vitals and nursing note reviewed.  Constitutional:      General: She is not in acute distress.    Appearance: Normal appearance. She is well-developed.  HENT:     Head: Normocephalic and atraumatic.     Nose: Nose normal.   Eyes:     Pupils: Pupils are equal, round, and reactive to light.    Cardiovascular:     Rate and Rhythm: Normal rate and regular rhythm.     Pulses: Normal pulses.     Heart sounds: Normal heart sounds.  Pulmonary:     Effort: Pulmonary effort is normal. No  respiratory distress.     Breath sounds: Normal breath sounds.  Abdominal:     General: Bowel sounds are normal. There is no distension.     Palpations: Abdomen is soft.     Tenderness: There is no abdominal tenderness. There is no guarding or rebound.   Musculoskeletal:        General: Normal range of motion.     Cervical back: Normal range of motion and neck supple.   Skin:    General: Skin is warm and dry.     Capillary Refill: Capillary refill takes less than 2 seconds.     Findings: No erythema or rash.   Neurological:     General: No focal deficit present.     Mental  Status: She is alert and oriented to person, place, and time.     Deep Tendon Reflexes: Reflexes normal.   Psychiatric:        Mood and Affect: Mood normal.     (all labs ordered are listed, but only abnormal results are displayed) Results for orders placed or performed during the hospital encounter of 08/24/23  CBC with Differential   Collection Time: 08/24/23  1:58 AM  Result Value Ref Range   WBC 7.8 4.0 - 10.5 K/uL   RBC 3.56 (L) 3.87 - 5.11 MIL/uL   Hemoglobin 11.6 (L) 12.0 - 15.0 g/dL   HCT 64.0 (L) 63.9 - 53.9 %   MCV 100.8 (H) 80.0 - 100.0 fL   MCH 32.6 26.0 - 34.0 pg   MCHC 32.3 30.0 - 36.0 g/dL   RDW 87.4 88.4 - 84.4 %   Platelets 231 150 - 400 K/uL   nRBC 0.0 0.0 - 0.2 %   Neutrophils Relative % 51 %   Neutro Abs 4.0 1.7 - 7.7 K/uL   Lymphocytes Relative 34 %   Lymphs Abs 2.6 0.7 - 4.0 K/uL   Monocytes Relative 11 %   Monocytes Absolute 0.9 0.1 - 1.0 K/uL   Eosinophils Relative 3 %   Eosinophils Absolute 0.2 0.0 - 0.5 K/uL   Basophils Relative 1 %   Basophils Absolute 0.1 0.0 - 0.1 K/uL   Immature Granulocytes 0 %   Abs Immature Granulocytes 0.02 0.00 - 0.07 K/uL  Basic metabolic panel   Collection Time: 08/24/23  1:58 AM  Result Value Ref Range   Sodium 136 135 - 145 mmol/L   Potassium 3.7 3.5 - 5.1 mmol/L   Chloride 110 98 - 111 mmol/L   CO2 17 (L) 22 - 32 mmol/L   Glucose,  Bld 105 (H) 70 - 99 mg/dL   BUN 10 8 - 23 mg/dL   Creatinine, Ser 9.18 0.44 - 1.00 mg/dL   Calcium 8.2 (L) 8.9 - 10.3 mg/dL   GFR, Estimated >39 >39 mL/min   Anion gap 9 5 - 15  Troponin I (High Sensitivity)   Collection Time: 08/24/23  1:58 AM  Result Value Ref Range   Troponin I (High Sensitivity) 4 <18 ng/L  Troponin I (High Sensitivity)   Collection Time: 08/24/23  4:31 AM  Result Value Ref Range   Troponin I (High Sensitivity) <2 <18 ng/L   CT Angio Chest PE W and/or Wo Contrast Result Date: 08/24/2023 EXAM: CTA CHEST AORTA 08/24/2023 05:03:57 AM TECHNIQUE: CTA of the chest was performed after the administration of intravenous contrast. Multiplanar reformatted images are provided for review. MIP images are provided for review. Automated exposure control, iterative reconstruction, and/or weight based adjustment of the mA/kV was utilized to reduce the radiation dose to as low as reasonably achievable. COMPARISON: CTA chest 12/29/2015 and 1 view chest x-ray 08/24/2023. CLINICAL HISTORY: Syncope/presyncope, cerebrovascular cause suspected. Pt BIB [facility] from home c/o CP. CP intermittent for two weeks. Reports worsening CP yesterday around 11 pm and progressing this morning. Reports sharp/aching CP left sided radiating left arm. Associated SOB, dizziness, diaphoresis, and nausea. EMS meds 324 ASA and NTG x1 with improvement of chest pain. FINDINGS: AORTA: No thoracic aortic dissection. No aneurysm. Minimal atherosclerotic calcification is present at the aortic arch. The great vessel origins are within normal limits. MEDIASTINUM: No mediastinal lymphadenopathy. The heart and pericardium demonstrate no acute abnormality. LYMPH NODES: No mediastinal, hilar or axillary lymphadenopathy. LUNGS AND PLEURA: Areas of dependent atelectasis are  present bilaterally. No focal consolidation or pulmonary edema. No pleural effusion or pneumothorax. UPPER ABDOMEN: Limited images of the upper abdomen are unremarkable.  SOFT TISSUES AND BONES: No acute bone or soft tissue abnormality. 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 Exaggerated thoracic kyphosis is stable. IMPRESSION: 1. No pulmonary embolism. 2. No acute cardiopulmonary disease. Electronically signed by: Lonni Necessary MD 08/24/2023 05:28 AM EDT RP Workstation: [id number]   DG Chest Portable 1 View Result Date: 08/24/2023 CLINICAL DATA:  Chest pain and recent panic attack EXAM: PORTABLE CHEST 1 VIEW COMPARISON:  03/12/2023 FINDINGS: Cardiac shadow is stable. Aortic calcifications are noted. Lungs are clear. Postsurgical changes in the thoracolumbar spine are noted. IMPRESSION: No acute abnormality noted. Electronically Signed   By: Oneil Devonshire M.D.   On: 08/24/2023 02:25     EKG:  EKG Interpretation Date/Time:  Sunday August 24 2023 02:02:53 EDT Ventricular Rate:  69 PR Interval:  156 QRS Duration:  91 QT Interval:  417 QTC Calculation: 447 R Axis:   0  Text Interpretation: Sinus rhythm Confirmed by Nettie, Blair Mesina ([id number]) on 08/24/2023 6:35:11 AM        Radiology: CT Angio Chest PE W and/or Wo Contrast Result Date: 08/24/2023 EXAM: CTA CHEST AORTA 08/24/2023 05:03:57 AM TECHNIQUE: CTA of the chest was performed after the administration of intravenous contrast. Multiplanar reformatted images are provided for review. MIP images are provided for review. Automated exposure control, iterative reconstruction, and/or weight based adjustment of the mA/kV was utilized to reduce the radiation dose to as low as reasonably achievable. COMPARISON: CTA chest 12/29/2015 and 1 view chest x-ray 08/24/2023. CLINICAL HISTORY: Syncope/presyncope, cerebrovascular cause suspected. Pt BIB [facility] from home c/o CP. CP intermittent for two weeks. Reports worsening CP yesterday around 11 pm and progressing this morning. Reports sharp/aching CP left sided radiating left arm. Associated SOB, dizziness, diaphoresis, and nausea. EMS meds 324 ASA and NTG x1 with  improvement of chest pain. FINDINGS: AORTA: No thoracic aortic dissection. No aneurysm. Minimal atherosclerotic calcification is present at the aortic arch. The great vessel origins are within normal limits. MEDIASTINUM: No mediastinal lymphadenopathy. The heart and pericardium demonstrate no acute abnormality. LYMPH NODES: No mediastinal, hilar or axillary lymphadenopathy. LUNGS AND PLEURA: Areas of dependent atelectasis are present bilaterally. No focal consolidation or pulmonary edema. No pleural effusion or pneumothorax. UPPER ABDOMEN: Limited images of the upper abdomen are unremarkable. SOFT TISSUES AND BONES: No acute bone or soft tissue abnormality. 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 Exaggerated thoracic kyphosis is stable. IMPRESSION: 1. No pulmonary embolism. 2. No acute cardiopulmonary disease. Electronically signed by: Lonni Necessary MD 08/24/2023 05:28 AM EDT RP Workstation: [id number]   DG Chest Portable 1 View Result Date: 08/24/2023 CLINICAL DATA:  Chest pain and recent panic attack EXAM: PORTABLE CHEST 1 VIEW COMPARISON:  03/12/2023 FINDINGS: Cardiac shadow is stable. Aortic calcifications are noted. Lungs are clear. Postsurgical changes in the thoracolumbar spine are noted. IMPRESSION: No acute abnormality noted. Electronically Signed   By: Oneil Devonshire M.D.   On: 08/24/2023 02:25     Procedures   Medications Ordered in the ED  alum & mag hydroxide-simeth (MAALOX/MYLANTA) 200-200-20 MG/5ML suspension 30 mL (30 mLs Oral Given 08/24/23 0315)  ketorolac  (TORADOL ) 30 MG/ML injection 15 mg (15 mg Intravenous Given 08/24/23 0315)  iohexol (OMNIPAQUE) 350 MG/ML injection 75 mL (75 mLs Intravenous Contrast Given 08/24/23 0509)  Medical Decision Making Patient with 2 weeks of chest pain sharp worse tonight   Amount and/or Complexity of Data Reviewed Independent Historian: EMS    Details: See above  External Data Reviewed: notes.     Details: Previous notes reviewed  Labs: ordered.    Details: Normal white count 7.8, low hemoglobin 11.6, normal platelets.  Normal sodium 136, normal potassium 3.7, normal creatinine.  2 negative troponins 4/ < 2 Radiology: ordered and independent interpretation performed.    Details: Negative CTA chest by me  ECG/medicine tests: ordered and independent interpretation performed. Decision-making details documented in ED Course.  Risk OTC drugs. Prescription drug management. Risk Details: Ruled out for MI heart score 2, low risk for MACE.  Ruled out for PE and PNA.  Symptoms most consistent with GERD.  Will start carafate.  Stable for discharge.  Strict returns    EKG Interpretation Date/Time:  Sunday August 24 2023 02:02:53 EDT Ventricular Rate:  69 PR Interval:  156 QRS Duration:  91 QT Interval:  417 QTC Calculation: 447 R Axis:   0  Text Interpretation: Sinus rhythm Confirmed by Jaymee Tilson ([id number]) on 08/24/2023 6:35:11 AM         Final diagnoses:  Precordial pain  No signs of systemic illness or infection. The patient is nontoxic-appearing on exam and vital signs are within normal limits.  I have reviewed the triage vital signs and the nursing notes. Pertinent labs & imaging results that were available during my care of the patient were reviewed by me and considered in my medical decision making (see chart for details). After history, exam, and medical workup I feel the patient has been appropriately medically screened and is safe for discharge home. Pertinent diagnoses were discussed with the patient. Patient was given return precautions.   ED Discharge Orders          Ordered    sucralfate (CARAFATE) 1 GM/10ML suspension  3 times daily with meals & bedtime        06 /22/25 0619               Jadie Comas, MD 08/24/23 [id number]

## 2023-08-24 NOTE — ED Notes (Signed)
 Pt called herself a cab and was wheelchair to lobby. Departs with her personal cane. Ambulatory without assistance.

## 2023-08-24 NOTE — ED Notes (Signed)
 AVS provided by edp was reviewed with pt. Pt verbalized understanding with no additional questions at this time.

## 2023-08-28 ENCOUNTER — Ambulatory Visit: Attending: Cardiology | Admitting: Cardiology

## 2023-08-28 ENCOUNTER — Encounter: Payer: Self-pay | Admitting: *Deleted

## 2023-08-28 ENCOUNTER — Encounter: Payer: Self-pay | Admitting: Cardiology

## 2023-08-28 VITALS — BP 101/65 | HR 63 | Resp 16 | Ht 63.0 in | Wt 210.0 lb

## 2023-08-28 DIAGNOSIS — I2089 Other forms of angina pectoris: Secondary | ICD-10-CM | POA: Diagnosis not present

## 2023-08-28 DIAGNOSIS — Z7901 Long term (current) use of anticoagulants: Secondary | ICD-10-CM | POA: Diagnosis not present

## 2023-08-28 DIAGNOSIS — R0609 Other forms of dyspnea: Secondary | ICD-10-CM | POA: Diagnosis not present

## 2023-08-28 DIAGNOSIS — E7800 Pure hypercholesterolemia, unspecified: Secondary | ICD-10-CM

## 2023-08-28 MED ORDER — ISOSORBIDE MONONITRATE ER 30 MG PO TB24: 30.0000 mg | ORAL_TABLET | Freq: Every day | ORAL | 6 refills | Status: DC

## 2023-08-28 MED ORDER — ISOSORBIDE MONONITRATE ER 30 MG PO TB24
30.0000 mg | ORAL_TABLET | Freq: Every day | ORAL | 6 refills | Status: AC
Start: 2023-08-28 — End: ?

## 2023-08-28 NOTE — Patient Instructions (Signed)
 Medication Instructions:  Your physician has recommended you make the following change in your medication:  Start isosorbide mononitrate 30 mg by mouth daily   *If you need a refill on your cardiac medications before your next appointment, please call your pharmacy*  Lab Work: none If you have labs (blood work) drawn today and your tests are completely normal, you will receive your results only by: MyChart Message (if you have MyChart) OR A paper copy in the mail If you have any lab test that is abnormal or we need to change your treatment, we will call you to review the results.  Testing/Procedures: Your physician has requested that you have a lexiscan  myoview . For further information please visit [email or web address] Please follow instruction sheet, as given.  Your physician has requested that you have an echocardiogram. Echocardiography is a painless test that uses sound waves to create images of your heart. It provides your doctor with information about the size and shape of your heart and how well your heart's chambers and valves are working. This procedure takes approximately one hour. There are no restrictions for this procedure. Please do NOT wear cologne, perfume, aftershave, or lotions (deodorant is allowed). Please arrive 15 minutes prior to your appointment time.  Please note: We ask at that you not bring children with you during ultrasound (echo/ vascular) testing. Due to room size and safety concerns, children are not allowed in the ultrasound rooms during exams. Our front office staff cannot provide observation of children in our lobby area while testing is being conducted. An adult accompanying a patient to their appointment will only be allowed in the ultrasound room at the discretion of the ultrasound technician under special circumstances. We apologize for any inconvenience.   Follow-Up: At [facility], you and your health needs are our priority.  As part of our  continuing mission to provide you with exceptional heart care, our providers are all part of one team.  This team includes your primary Cardiologist (physician) and Advanced Practice Providers or APPs (Physician Assistants and Nurse Practitioners) who all work together to provide you with the care you need, when you need it.  Your next appointment:    About 2 month(s)  Provider:   Gordy Bergamo, MD    We recommend signing up for the patient portal called MyChart.  Sign up information is provided on this After Visit Summary.  MyChart is used to connect with patients for Virtual Visits (Telemedicine).  Patients are able to view lab/test results, encounter notes, upcoming appointments, etc.  Non-urgent messages can be sent to your provider as well.   To learn more about what you can do with MyChart, go to [email or web address].   Other Instructions

## 2023-08-28 NOTE — Progress Notes (Signed)
 Cardiology Office Note:  .   Date:  08/28/2023  ID:  Emily Peck, DOB Mar 17, 1955, MRN [id number] PCP: Trudy Ave, NP  [facility] Providers Cardiologist:  Gordy Bergamo, MD   History of Present Illness: .   Emily Peck is a 68 y.o. Female patient referred by Jon Budge, NP for evaluation of chest pain.  Patient was seen in the emergency room on 08/24/2023 for chest pain, ruled out for myocardial infarction and discharged home.  Past medical history significant for left eye blindness, h/o GBS, chronic back pain and walks with a cane and walker, mixed hypercholesteremia and chronic vertigo/dizziness.  She has chronic leg edema that has been stable.  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  She presented to the emergency room on 08/24/2023 with chest pain relieved with and improved with nitroglycerin associated with marked diaphoresis.  She has chronic underlying dyspnea.  Cardiac workup including troponins were negative and EKG was normal hence discharged home with a diagnosis of GERD.  Ruled out for PE, CT angiogram of the chest revealing aortic atherosclerosis.  She was recently started by her PCP on atorvastatin 20 mg daily.  She has had a negative Lexiscan  nuclear stress test on 03/17/2019 with normal EF at 67% and echocardiogram revealed normal LVEF at 65% with moderate aortic regurgitation.  Discussed the use of AI scribe software for clinical note transcription with the patient, who gave verbal consent to proceed.  History of Present Illness Emily Peck is a 69 year old female with a history of a brain clot on warfarin who presents with chest pain. She was referred by her primary doctor for evaluation of chest pain.  She has been experiencing worsening chest pain for a few weeks, radiating to the left arm and sometimes to the neck, often triggered at night when lying down and associated with  sweating. The pain can be brief or persistent, as it was on a recent Saturday night, leading her to seek emergency care where she was diagnosed with severe acid reflux and prescribed sucralfate with minimal relief.  She experiences shortness of breath with minimal exertion, such as walking from downstairs, which significantly impacts her daily activities. She uses a cane and a walker for mobility due to back pain and neuropathy affecting her legs.  Her current medications include warfarin for a past brain clot and atorvastatin 20 mg for high cholesterol, which she recently started but mistakenly takes half a tablet daily instead of the prescribed full dose.  Labs   External Labs:  Care Everywhere labs 08/24/2023:  Hb 11.6/HCT 35.9, MCV 100.8, platelets 231.  Sodium 136, potassium 3.7, BUN 10, creatinine 0.81, EGFR 60 mL.  LFTs normal.   Serum troponin negative at 2.0.  Labs 03/19/2023:  TSH normal at 1.930.  Vitamin D30.0.  Total cholesterol 234, triglycerides 136, HDL 62, LDL 148.  ROS  Review of Systems  Cardiovascular:  Positive for chest pain (precordial with left arm radiation). Negative for dyspnea on exertion and leg swelling.    Physical Exam:   VS:  BP 101/65 (BP Location: Left Arm, Patient Position: Sitting, Cuff Size: Large)   Pulse 63   Resp 16   Ht 5' 3 (1.6 m)   Wt 210 lb (95.3 kg)   SpO2 98%   BMI 37.20 kg/m    Wt Readings from Last 3 Encounters:  08/28/23 210 lb (95.3 kg)  03/12/23 203 lb  7.8 oz (92.3 kg)  08/01/22 203 lb 8 oz (92.3 kg)    Physical Exam Constitutional:      Appearance: She is obese.  Neck:     Vascular: No carotid bruit or JVD.   Cardiovascular:     Rate and Rhythm: Normal rate and regular rhythm.     Pulses: Intact distal pulses.     Heart sounds: Normal heart sounds. No murmur heard.    No gallop.  Pulmonary:     Effort: Pulmonary effort is normal.     Breath sounds: Normal breath sounds.  Abdominal:     General: Bowel sounds  are normal.     Palpations: Abdomen is soft.   Musculoskeletal:     Right lower leg: No edema.     Left lower leg: No edema.    Studies Reviewed: .    CT Angio chest and CXR 08/24/23 EKG:       EKG 08/24/2023: Normal sinus rhythm at the rate of 69 bpm.  Poor R wave progression, probably normal variant.  No evidence of ischemia.  Compared to 03/12/2023, no significant change.  Medications ordered    Meds ordered this encounter  Medications   isosorbide mononitrate (IMDUR) 30 MG 24 hr tablet    Sig: Take 1 tablet (30 mg total) by mouth daily.    Dispense:  30 tablet    Refill:  6     ASSESSMENT AND PLAN: .      ICD-10-CM   1. Stable angina pectoris (HCC)  I20.89 isosorbide mononitrate (IMDUR) 30 MG 24 hr tablet    ECHOCARDIOGRAM COMPLETE    MYOCARDIAL PERFUSION IMAGING    Cardiac Stress Test: Informed Consent Details: Physician/Practitioner Attestation; Transcribe to consent form and obtain patient signature    2. DOE (dyspnea on exertion)  R06.09 ECHOCARDIOGRAM COMPLETE    MYOCARDIAL PERFUSION IMAGING    Cardiac Stress Test: Informed Consent Details: Physician/Practitioner Attestation; Transcribe to consent form and obtain patient signature    3. Pure hypercholesterolemia  E78.00     4. Current use of long term anticoagulation: cerebral venous sinus thrombosis 2004  Z79.01       Assessment and Plan Assessment & Plan Chest pain very suspicious for angina pectoris Intermittent chest pain radiating to the left arm and under the breasts, primarily nocturnal, associated with sweating and shortness of breath.  She has also noticed exertional chest discomfort while doing household routine activities.  She did get relief of chest pain with nitroglycerin when she activated EMS and went to the emergency room on 08/24/2023.  Differential includes cardiac etiology versus GERD. Recent ER visit diagnosed GERD, but cardiac cause remains a concern. - Start isosorbide mononitrate  - Order  Lexiscan  nuclear stress test to evaluate cardiac function  Shortness of breath Exertional dyspnea limiting physical activity, associated with chest pain, possibly cardiac-related. - Order stress test to evaluate cardiac function and also echocardiogram previous echo in 21 had revealed moderate aortic regurgitation as well although I cannot hear murmur.  High cholesterol Recently started on atorvastatin 20 mg for hyperlipidemia. Initially taking half a tablet due to misunderstanding of prescription instructions. - Instruct to take atorvastatin 20 mg once daily as prescribed  History of cerebral venous thrombosis On chronic warfarin for previous cerebral venous sinus thrombosis.  Office visit in 6 to 8 weeks or sooner if she continues to have persistent symptoms or if the nuclear stress test is high risk.   Signed,  Gordy Bergamo, MD, FACC 08/28/2023, 10:13  AM [facility] [address] [place], [place] [address] Phone: [phone]. Fax:  [phone]

## 2023-08-28 NOTE — Addendum Note (Signed)
 Addended by: Catheryne Deford L on: 08/28/2023 10:22 AM   Modules accepted: Orders

## 2023-10-06 ENCOUNTER — Encounter (HOSPITAL_COMMUNITY): Payer: Self-pay | Admitting: *Deleted

## 2023-10-06 ENCOUNTER — Telehealth (HOSPITAL_COMMUNITY): Payer: Self-pay | Admitting: *Deleted

## 2023-10-06 NOTE — Telephone Encounter (Signed)
 Letter with instructions for upcoming stress test sent via [employer].  Argentina Bees, RN

## 2023-10-13 ENCOUNTER — Other Ambulatory Visit: Payer: Self-pay | Admitting: Cardiology

## 2023-10-13 DIAGNOSIS — R0609 Other forms of dyspnea: Secondary | ICD-10-CM

## 2023-10-13 DIAGNOSIS — I2089 Other forms of angina pectoris: Secondary | ICD-10-CM

## 2023-10-14 ENCOUNTER — Encounter: Payer: Self-pay | Admitting: Cardiology

## 2023-10-14 ENCOUNTER — Other Ambulatory Visit: Payer: Self-pay | Admitting: Cardiology

## 2023-10-14 DIAGNOSIS — R0609 Other forms of dyspnea: Secondary | ICD-10-CM

## 2023-10-14 DIAGNOSIS — I2089 Other forms of angina pectoris: Secondary | ICD-10-CM

## 2023-10-15 ENCOUNTER — Ambulatory Visit (HOSPITAL_COMMUNITY)
Admission: RE | Admit: 2023-10-15 | Discharge: 2023-10-15 | Disposition: A | Discharge status: Home / Self Care | Source: Ambulatory Visit | Attending: Cardiology | Admitting: Cardiology

## 2023-10-15 ENCOUNTER — Ambulatory Visit (HOSPITAL_BASED_OUTPATIENT_CLINIC_OR_DEPARTMENT_OTHER)
Admission: RE | Admit: 2023-10-15 | Discharge: 2023-10-15 | Disposition: A | Discharge status: Home / Self Care | Source: Ambulatory Visit | Attending: Cardiology | Admitting: Cardiology

## 2023-10-15 ENCOUNTER — Ambulatory Visit: Payer: Self-pay | Admitting: Cardiology

## 2023-10-15 DIAGNOSIS — I2089 Other forms of angina pectoris: Secondary | ICD-10-CM

## 2023-10-15 DIAGNOSIS — R0609 Other forms of dyspnea: Secondary | ICD-10-CM | POA: Insufficient documentation

## 2023-10-15 LAB — MYOCARDIAL PERFUSION IMAGING
LV dias vol: 79 mL (ref 46–106)
LV sys vol: 14 mL (ref 3.8–5.2)
Nuc Stress EF: 82 %
Peak HR: 71 {beats}/min
Rest HR: 54 {beats}/min
Rest Nuclear Isotope Dose: 10.8 mCi
SDS: 3
SRS: 3
SSS: 5
ST Depression (mm): 0 mm
Stress Nuclear Isotope Dose: 32 mCi
TID: 1.05

## 2023-10-15 LAB — ECHOCARDIOGRAM COMPLETE
AR max vel: 2.26 cm2
AV Area VTI: 2.11 cm2
AV Area mean vel: 2.04 cm2
AV Mean grad: 3 mmHg
AV Peak grad: 4.5 mmHg
Ao pk vel: 1.06 m/s
Area-P 1/2: 4.71 cm2
P 1/2 time: 1142 ms
S' Lateral: 2.6 cm

## 2023-10-15 MED ORDER — REGADENOSON 0.4 MG/5ML IV SOLN
INTRAVENOUS | Status: AC
  Filled 2023-10-15: qty 5

## 2023-10-15 MED ORDER — TECHNETIUM TC 99M TETROFOSMIN IV KIT
32.0000 | PACK | Freq: Once | INTRAVENOUS | Status: AC | PRN
  Administered 2023-10-15 (×2): 32 via INTRAVENOUS

## 2023-10-15 MED ORDER — REGADENOSON 0.4 MG/5ML IV SOLN
0.4000 mg | Freq: Once | INTRAVENOUS | Status: AC
  Administered 2023-10-15 (×2): 0.4 mg via INTRAVENOUS

## 2023-10-15 MED ORDER — PERFLUTREN LIPID MICROSPHERE
1.0000 mL | INTRAVENOUS | Status: DC | PRN
  Administered 2023-10-15 (×2): 2 mL via INTRAVENOUS

## 2023-10-15 MED ORDER — TECHNETIUM TC 99M TETROFOSMIN IV KIT
10.8000 | PACK | Freq: Once | INTRAVENOUS | Status: AC | PRN
  Administered 2023-10-15 (×2): 10.8 via INTRAVENOUS

## 2023-10-15 NOTE — Progress Notes (Signed)
 Normal heart function. Moderate lvh (LVH = Left ventricular hypertrophy: thickening of heart muscle probably due to hypertension. Can happen in valve leaking issues, obesity, diabetes.

## 2023-10-19 NOTE — Progress Notes (Signed)
 D/W Dr. Lonni (reader), Mild ischemia anterior wall without WMA. Intermediate risk study. Will discuss more on visit soon, not urgent.  Needs f/u with PCP with repeat scan in a year for lung nodule.  1. 3 mm right lower lobe pulmonary nodule. Consider follow-up chest CT in 1 year. 2. Aortic atherosclerosis.

## 2023-10-20 NOTE — Telephone Encounter (Signed)
 Patient is requesting a call back to review echo + stress test results.

## 2023-10-20 NOTE — Telephone Encounter (Signed)
 I spoke with patient and reviewed echo and stress test results with her.  Patient aware of appointment with Dr Ladona on August 27

## 2023-10-29 ENCOUNTER — Ambulatory Visit: Attending: Cardiology | Admitting: Cardiology

## 2023-10-29 ENCOUNTER — Other Ambulatory Visit: Payer: Self-pay | Admitting: *Deleted

## 2023-10-29 ENCOUNTER — Encounter: Payer: Self-pay | Admitting: Cardiology

## 2023-10-29 ENCOUNTER — Other Ambulatory Visit (HOSPITAL_COMMUNITY): Payer: Self-pay

## 2023-10-29 VITALS — BP 102/62 | HR 61 | Resp 16 | Ht 63.0 in | Wt 206.8 lb

## 2023-10-29 DIAGNOSIS — E7800 Pure hypercholesterolemia, unspecified: Secondary | ICD-10-CM | POA: Diagnosis not present

## 2023-10-29 DIAGNOSIS — I2089 Other forms of angina pectoris: Secondary | ICD-10-CM

## 2023-10-29 DIAGNOSIS — R931 Abnormal findings on diagnostic imaging of heart and coronary circulation: Secondary | ICD-10-CM

## 2023-10-29 DIAGNOSIS — Z7901 Long term (current) use of anticoagulants: Secondary | ICD-10-CM | POA: Diagnosis not present

## 2023-10-29 MED ORDER — AMLODIPINE BESYLATE 2.5 MG PO TABS
2.5000 mg | ORAL_TABLET | Freq: Every day | ORAL | 2 refills | Status: DC
  Filled 2023-10-29: qty 90, 90d supply, fill #0

## 2023-10-29 MED ORDER — ATORVASTATIN CALCIUM 20 MG PO TABS
20.0000 mg | ORAL_TABLET | Freq: Every day | ORAL | 1 refills | Status: AC
  Filled 2023-10-29: qty 90, 90d supply, fill #0

## 2023-10-29 MED ORDER — NITROGLYCERIN 0.4 MG SL SUBL
0.4000 mg | SUBLINGUAL_TABLET | SUBLINGUAL | 2 refills | Status: DC | PRN
  Filled 2023-10-29: qty 25, 15d supply, fill #0

## 2023-10-29 NOTE — Progress Notes (Signed)
 Cardiology Office Note:  .   Date:  10/29/2023  ID:  Emily Peck, DOB January 05, 1956, MRN [id number] PCP: Trudy Ave, NP  [facility] Providers Cardiologist:  Gordy Bergamo, MD   History of Present Illness: .   Emily Peck is a 68 y.o. Female patient presents for follow-up of chest pain and chronic dyspnea.  Patient was seen in the emergency room on 08/24/2023 for chest pain, ruled out for myocardial infarction and discharged home.  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  She has chronic leg edema that has been stable.  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   Underwent myocardial perfusion imaging on 10/15/2023 revealing coronary calcification on CT images, large ischemic defect in the anterior, anteroseptal and inferoseptal region with ejection fraction of 82%, considered to be intermediate risk.  Echocardiogram on the same day revealed normal LVEF with moderate asymmetric LVH in the basal septum without LVOT gradient.  RV was mildly enlarged.   Chest pain symptoms with chest tightness with exertion and radiation to the left arm is suggestive of chronic stable angina.  She has used sublingual nitroglycerin  since her last saw her with relief of chest discomfort.  She has not had any rest pain.  Cardiac Studies relevent.    MYOCARDIAL PERFUSION IMAGING 10/15/2023    Findings are consistent with ischemia. The study is intermediate risk.   No ST deviation was noted. The ECG was not diagnostic due to pharmacologic protocol.   LV perfusion is abnormal. There is evidence of ischemia. Defect 1: There is a large defect with moderate reduction in uptake present in the apical to basal anterior, anteroseptal and inferoseptal location(s) that is partially reversible.  There is normal wall motion in the defect area. Consistent with ischemia.   Left ventricular function is normal.  ECHOCARDIOGRAM COMPLETE 10/15/2023  1. Left ventricular ejection fraction, by estimation, is 60 to 65%. The left ventricle has normal function. The left ventricle has no regional wall motion abnormalities. There is moderate asymmetric left ventricular hypertrophy of the basal-septal segment. No LV outflow tract gradient of mitral valve systolic anterior motion. Left ventricular diastolic parameters are indeterminate. 2. Right ventricular systolic function is normal. The right ventricular size is mildly enlarged. There is normal pulmonary artery systolic pressure. The estimated right ventricular systolic pressure is 33.0 mmHg.     Discussed the use of AI scribe software for clinical note transcription with the patient, who gave verbal consent to proceed.  History of Present Illness She experiences aching chest pain radiating down her arm, initially thought to be acid reflux. She does not use nitroglycerin  for these episodes as her prescription is for nighttime use only. She recently underwent a stress test and recalls being informed of a blockage in an important area of her heart. 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  Labs   Care everywhere/Faxed External Labs:  Hb 11.6/HCT 35.9, MCV 100.8, platelets 231.   Sodium 136, potassium 3.7, BUN 10, creatinine 0.81, EGFR 60 mL.  LFTs normal.   Serum troponin negative at 2.0.   Labs 03/19/2023:   TSH normal at 1.930.  Vitamin D30.0.   Total cholesterol 234, triglycerides 136, HDL 62, LDL 148.  ROS  Review of Systems  Cardiovascular:  Positive for chest pain. Negative for dyspnea on exertion and leg swelling.   Physical Exam:   VS:  BP 102/62 (BP Location: Left Arm, Patient Position: Sitting, Cuff  Size: Large)   Pulse 61   Resp 16   Ht 5' 3 (1.6 m)   Wt 206 lb 12.8 oz (93.8 kg)   SpO2 (!) 61%   BMI 36.63 kg/m    Wt Readings from Last 3 Encounters:  10/29/23 206 lb 12.8 oz (93.8 kg)  10/15/23 210 lb (95.3 kg)  08/28/23 210 lb (95.3 kg)    BP Readings from Last 3 Encounters:  10/29/23 102/62  08/28/23 101/65  08/24/23 116/81   Physical Exam Constitutional:      Appearance: She is obese.  Neck:     Vascular: No carotid bruit or JVD.  Cardiovascular:     Rate and Rhythm: Normal rate and regular rhythm.     Pulses: Intact distal pulses.     Heart sounds: Normal heart sounds. No murmur heard.    No gallop.  Pulmonary:     Effort: Pulmonary effort is normal.     Breath sounds: Normal breath sounds.  Abdominal:     General: Bowel sounds are normal.     Palpations: Abdomen is soft.  Musculoskeletal:     Right lower leg: No edema.     Left lower leg: No edema.    EKG:         ASSESSMENT AND PLAN: .      ICD-10-CM   1. Abnormal nuclear cardiac imaging test  R93.1     2. Stable angina pectoris (HCC)  I20.89 amLODipine  (NORVASC ) 2.5 MG tablet    nitroGLYCERIN  (NITROSTAT ) 0.4 MG SL tablet    atorvastatin  (LIPITOR) 20 MG tablet    3. Pure hypercholesterolemia  E78.00 atorvastatin  (LIPITOR) 20 MG tablet    Lipid Profile    4. Current use of long term anticoagulation: cerebral venous sinus thrombosis 2004  Z79.01      Assessment & Plan Coronary artery disease with angina Intermittent chest pain with radiation down the arm, suggestive of angina. Stress test suggest ischemia in the LAD distribution, a critical area. Risk of myocardial infarction if untreated. - Prescribe nitroglycerin  for sublingual use as needed for chest pain lasting more than five minutes. -Added amlodipine  2.5 mg daily for antianginal effects. - Advise to seek emergency care if more than two or three nitroglycerin  tablets are needed in a day. - Consider heart catheterization and stent  placement if angina persists despite medication.  Hyperlipidemia Suboptimal adherence to atorvastatin  therapy due to concerns about Guillain-Barr syndrome. Elevated cholesterol levels likely due to inconsistent medication use. Emphasized the importance of daily atorvastatin  to prevent myocardial infarction. - Prescribe atorvastatin  20 mg daily. - Check lipid panel in six weeks to assess response to therapy.  Obesity Obesity contributing to cardiovascular risk. Discussed the importance of weight loss in managing overall health and reducing cardiovascular risk. - Recommend weight loss through dietary changes.  On chronic warfarin for cerebral sinus thrombosis in the remote past.   Follow up: 6 months for follow-up, she will call us  if she has recurrent episodes of chest pain and frequent use of nitroglycerin .  Signed,  Gordy Bergamo, MD, [facility] 10/29/2023, 11:29 AM [facility] [address] [place], [place] [address] Phone: [phone]. Fax:  [phone]

## 2023-10-29 NOTE — Patient Instructions (Signed)
 Medication Instructions:  Your physician has recommended you make the following change in your medication: Resume atorvastatin  20 mg by mouth daily  Start amlodipine  2.5 mg by mouth daily   *If you need a refill on your cardiac medications before your next appointment, please call your pharmacy*  Lab Work: Have fasting lab work done few days prior to appointment with Dr Ladona  on October 14.  Lipid profile.  This can be done at any [facility] location.  There is a [facility] on the first floor of our building If you have labs (blood work) drawn today and your tests are completely normal, you will receive your results only by: MyChart Message (if you have MyChart) OR A paper copy in the mail If you have any lab test that is abnormal or we need to change your treatment, we will call you to review the results.  Testing/Procedures: none  Follow-Up: At [facility], you and your health needs are our priority.  As part of our continuing mission to provide you with exceptional heart care, our providers are all part of one team.  This team includes your primary Cardiologist (physician) and Advanced Practice Providers or APPs (Physician Assistants and Nurse Practitioners) who all work together to provide you with the care you need, when you need it.  Your next appointment:   October 14 at 11 AM  Provider:   Gordy Ladona, MD    We recommend signing up for the patient portal called MyChart.  Sign up information is provided on this After Visit Summary.  MyChart is used to connect with patients for Virtual Visits (Telemedicine).  Patients are able to view lab/test results, encounter notes, upcoming appointments, etc.  Non-urgent messages can be sent to your provider as well.   To learn more about what you can do with MyChart, go to [email or web address].   Other Instructions Nitroglycerin  Sublingual Tablets What is this medication? NITROGLYCERIN  (nye troe GLI ser in) prevents and treats chest  pain (angina). It works by relaxing blood vessels, which decreases the amount of work the heart has to do. It belongs to a group of medications called nitrates. This medicine may be used for other purposes; ask your health care provider or pharmacist if you have questions. COMMON BRAND NAME(S): Nitroquick, Nitrostat , Nitrotab What should I tell my care team before I take this medication? They need to know if you have any of these conditions: Anemia Head injury, recent stroke, or bleeding in the brain Liver disease Previous heart attack An unusual or allergic reaction to nitroglycerin , other medications, foods, dyes, or preservatives Pregnant or trying to get pregnant Breast-feeding How should I use this medication? Take this medication by mouth as needed. Use at the first sign of an angina attack (chest pain or tightness). You can also take this medication 5 to 10 minutes before an event likely to produce chest pain. Follow the directions exactly as written on the prescription label. Place one tablet under your tongue and let it dissolve. Do not swallow whole. Replace the dose if you accidentally swallow it. It will help if your mouth is not dry. Saliva around the tablet will help it to dissolve more quickly. Do not eat or drink, smoke or chew tobacco while a tablet is dissolving. Sit down when taking this medication. In an angina attack, you should feel better within 5 minutes after your first dose. You can take a dose every 5 minutes up to a total of 3 doses. If you do  not feel better or feel worse after 1 dose, call 9-1-1 at once. Do not take more than 3 doses in 15 minutes. Your care team might give you other directions. Follow those directions if they do. Do not take your medication more often than directed. Talk to your care team about the use of this medication in children. Special care may be needed. Overdosage: If you think you have taken too much of this medicine contact a poison control  center or emergency room at once. NOTE: This medicine is only for you. Do not share this medicine with others. What if I miss a dose? This does not apply. This medication is only used as needed. What may interact with this medication? Do not take this medication with any of the following: Certain migraine medications, such as ergotamine and dihydroergotamine (DHE) Medications used to treat erectile dysfunction, such as sildenafil, tadalafil, and vardenafil Riociguat This medication may also interact with the following: Alteplase Aspirin Heparin  Medications for blood pressure Medications for depression Other medications used to treat angina Phenothiazines, such as chlorpromazine, mesoridazine, prochlorperazine, thioridazine This list may not describe all possible interactions. Give your health care provider a list of all the medicines, herbs, non-prescription drugs, or dietary supplements you use. Also tell them if you smoke, drink alcohol , or use illegal drugs. Some items may interact with your medicine. What should I watch for while using this medication? Tell your care team if you feel your medication is no longer working. Keep this medication with you at all times. Sit or lie down when you take your medication to prevent falling if you feel dizzy or faint after using it. Try to remain calm. This will help you to feel better faster. If you feel dizzy, take several deep breaths and lie down with your feet propped up, or bend forward with your head resting between your knees. This medication may affect your coordination, reaction time, or judgment. Do not drive or operate machinery until you know how this medication affects you. Sit up or stand slowly to reduce the risk of dizzy or fainting spells. Drinking alcohol  with this medication can increase the risk of these side effects. Do not treat yourself for coughs, colds, or pain while you are taking this medication without asking your care team  for advice. Some ingredients may increase your blood pressure. What side effects may I notice from receiving this medication? Side effects that you should report to your care team as soon as possible: Allergic reactions--skin rash, itching, hives, swelling of the face, lips, tongue, or throat Headache, unusual weakness or fatigue, shortness of breath, nausea, vomiting, rapid heartbeat, blue skin or lips, which may be signs of methemoglobinemia Increased pressure around the brain--severe headache, blurry vision, change in vision, nausea, vomiting Low blood pressure--dizziness, feeling faint or lightheaded, blurry vision Slow heartbeat--dizziness, feeling faint or lightheaded, confusion, trouble breathing, unusual weakness or fatigue Worsening chest pain (angina)--pain, pressure, or tightness in the chest, neck, back, or arms Side effects that usually do not require medical attention (report to your care team if they continue or are bothersome): Dizziness Flushing Headache This list may not describe all possible side effects. Call your doctor for medical advice about side effects. You may report side effects to FDA at 1-800-FDA-1088. Where should I keep my medication? Keep out of the reach of children. Store at room temperature between 20 and 25 degrees C (68 and 77 degrees F). Store in Retail buyer. Protect from light and moisture. Keep tightly  closed. Throw away any unused medication after the expiration date. NOTE: This sheet is a summary. It may not cover all possible information. If you have questions about this medicine, talk to your doctor, pharmacist, or health care provider.  2024 Elsevier/Gold Standard (2022-02-18 00:00:00)

## 2023-11-05 NOTE — Progress Notes (Deleted)
 PATIENT: Emily Peck DOB: 08/08/1955  REASON FOR VISIT: follow up HISTORY FROM: patient  No chief complaint on file.   HISTORY OF PRESENT ILLNESS:  11/05/23 ALL: Emily Peck returns for follow up for peripheral neuropathy. She was last seen 07/2022 and stable on gabapentin  1200mg  TID. Since,   08/01/2022 ALL:  Emily Peck returns for follow up for peripheral neuropathy. She continues gabapentin  1200mg  TID. She feels that it helps to control pain. She continues to have constant burning, worse at night. She had more sharp stabbing pains at bedtime. Overall, symptoms are stable on gabapentin . Qutenza  patches were helping. She is no longer followed by Dr Carilyn. Drug screen positive for alcohol  and warning letter sent. Emily Peck reports occasional alcohol  use and was offended by letter. She has not discussed with Dr Carilyn.   She was seen by Dr Ines in consult for headaches 12/2021. MRI cervical spine ordered and showed degenerative changes but no specific nerve root compression. ESIs offered. PT/OT ordered. She completed therapy 02/2022 as goals were met. She reports headaches have been better. She is able to treat conservatively.   From a thorough review of records, medications tried that can be used in headache management include: Gabapentin , topiramate , Celexa, Lexapro , magnesium, trazodone , hydrocodone , Flexeril , Tylenol , Tylenol  3, aspirin, baclofen , Soma , Flexeril , Valium , codeine , Benadryl , ibuprofen, ketorolac  injections, lidocaine  injections and patches, meclizine, Robaxin , Reglan  orally and injection, Zofran  oral and injection, prednisone  tablets, tizanidine , tramadol    07/31/2021 ALL: Emily Peck returns for follow up for peripheral neuropathy. She continues gabapentin  1200mg  TID. She was last seen 03/2020 and we attempted to get a topical compounded neuropathy cream covered. She was unable to afford. She talked to a rep about Qutenza  but wasn't sure she could afford. She continues to have  waxing and waning burning sensation of both feet. Dorsal area of foot bothers her more, recently. She also reports swelling of her feet over the past year. She is taking Lasix . Compression stockings rarely used as they tend to increase neuropathy pain. She is followed closely by Dr Carilyn with pain management.   03/29/2020 ALL:  She returns today for follow up for peripheral neuropathy on gabapentin . She continues 1200mg  TID. We ordered a compounded neuropathy cream at last visit but she reports that she never heard back about this. I will resend orders today. Pain continues. Her feet burn all the time. She feels it is most bothersome at night. She has significant pain in the lower legs and they are very tender to touch. They stay red. She is followed by pain management on Tylenol  $ but it does not help at all. She has consistent low back pain. She tries to be active but hurts all the time. She continues to have falls regularly. She feels it is related to not being able to see. She does use a cane at all times. She is able to navigate public transportation.   She was seen by Dr Chalice in 04/2019 for excessive daytime sleepiness. Sleep study was normal with no evidence of sleep disordered breathing. 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 She was encouraged to focus on sleep hygiene. Melatonin recommended. She has not noted any significant changes but admits that sleep schedule is not consistent.   03/30/2019 ALL: Emily Peck is a 68 y.o. female here today for follow up for peripheral neuropathy. She continues to have burning pain in bilateral lower extremities. She is taking gabapentin  1200mg  three times daily. She does feel that this help.  Pain is better with rest, worse with activity. She denies changes in her gait. She does have a history of falls. She contributes this to vision loss. She has a prosthetic right eye and limited visual file with left eye. She is followed  closely by PCP. She denies falls when she uses her cane.   HISTORY: (copied from [place] [facility]'s note on 10/15/2017)  UPDATE 8/14/2019CM Emily Peck, 68 year old female returns for follow-up with polyneuropathy in her feet for years which has progressed over time.  It is a pins-and-needles sensation.  She is currently on gabapentin  with benefit.  She continues to see Dr. Gemma for chronic low back pain and lumbar medial branch blocks.  She has a appointment in September.  She claims the injections last about 6 months.  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  She has seen a specialist at [facility] that has said nothing else can be done for her vision.  She was encouraged to be safe with her ambulation. She ambulates with a single-point cane.  She returns for reevaluation   UPDATE 05/04/2018CM Emily Peck, 68 year old female returns for follow-up with history of polyneuropathy in her feet for years which has progressively worsened over time. She has a pins and needles burning in the feet, used to be only on the bottom of the feet balance on the top of the feet as well.She had right knee replacement November 2017. She is still in physical therapy. She continues to see Dr.Kirsteins  for chronic low back pain and receives lumbar medial branch blocks. She has an appointment today. She says the injections last about 6 months. She continues to have lot of left leg pain due to her knee surgery. She denies any falls. She is ambulating with single-point cane. She returns for reevaluation   HISTORY 10/19/15 Emily Peck is a 68 y.o. female here as a referral from Dr. Jarold for neuropathy in the feet. PMHx of hyperlipidemia, chronic lumbar radiculopathy, polyneuropathy, obesity, thoracic compression fractures T10-T11 s/pT12-L1 fusion, depression, glaucoma, retinal detachment, macular degeneration and poor vision.. She has had  neuropathy in the feet for years. Progressively worsening, continuous. Pins and needles, burning in the feet, it used to be under the bottom now it on the top also. She denies diabetes or using any medications that can cause neuropathy she has never been exposed to toxins that can cause neuropathy. Mother was diabetic and had neuropathy. Associated ith cramps in the feet. The burning is the worst. Worse at night. The gabapentin  helps. She takes 600mg  4x a day. Continous all day. Unknown inciting events. No medications that can induce neuropathy. No alcohol  use. She takes 600mg  neurontin  4x a day.    REVIEW OF SYSTEMS: Out of a complete 14 system review of symptoms, the patient complains only of the following symptoms, neuropathy, chronic pain, vision loss and all other reviewed systems are negative.   ALLERGIES: Allergies  Allergen Reactions   Codeine      Other reaction(s): itching Other reaction(s): itching   No Known Allergies     HOME MEDICATIONS: Outpatient Medications Prior to Visit  Medication Sig Dispense Refill   amLODipine  (NORVASC ) 2.5 MG tablet Take 1 tablet (2.5 mg total) by mouth daily. 90 tablet 2   Ascorbic Acid (VITAMIN C PO) Take 1 tablet by mouth daily.     atorvastatin  (LIPITOR) 20 MG tablet Take 1 tablet (20 mg total) by  mouth daily. 90 tablet 1   B Complex Vitamins (VITAMIN B COMPLEX PO) Take 1 tablet by mouth daily.     cyclobenzaprine  (FLEXERIL ) 5 MG tablet Take 5 mg by mouth at bedtime as needed.     dimenhyDRINATE (DRAMAMINE) 50 MG tablet Take 25 mg by mouth at bedtime as needed for dizziness.     diphenhydrAMINE  (BENADRYL ) 25 MG tablet Take 25-50 mg by mouth 4 (four) times daily as needed for itching. Takes with Tylenol  #4 due to codeine  allergy.     escitalopram  (LEXAPRO ) 20 MG tablet Take 20 mg by mouth at bedtime.   0   FEROSUL 325 (65 Fe) MG tablet Take 325 mg by mouth daily.     fluticasone (FLONASE) 50 MCG/ACT nasal spray Place 1 spray into both nostrils in  the morning and at bedtime.     gabapentin  (NEURONTIN ) 600 MG tablet Take 2 tablets (1,200 mg total) by mouth 3 (three) times daily. 540 tablet 3   HYDROcodone -acetaminophen  (NORCO) 5-325 MG tablet Take 1 tablet by mouth 2 (two) times daily as needed for moderate pain. 60 tablet 0   isosorbide  mononitrate (IMDUR ) 30 MG 24 hr tablet Take 1 tablet (30 mg total) by mouth daily. 30 tablet 6   meclizine (ANTIVERT) 25 MG tablet Take 25 mg by mouth 2 (two) times daily.     nitroGLYCERIN  (NITROSTAT ) 0.4 MG SL tablet Place 1 tablet (0.4 mg total) under the tongue every 5 (five) minutes as needed for chest pain. 25 tablet 2   pantoprazole  (PROTONIX ) 40 MG tablet Take 1 tablet (40 mg total) by mouth 2 (two) times daily. 60 tablet 0   sucralfate  (CARAFATE ) 1 GM/10ML suspension Take 10 mLs (1 g total) by mouth 4 (four) times daily -  with meals and at bedtime. 420 mL 0   topiramate  (TOPAMAX ) 200 MG tablet Take 200 mg by mouth at bedtime.     traZODone  (DESYREL ) 150 MG tablet Take 150 mg by mouth at bedtime.  0   VITAMIN D PO Take 1 tablet by mouth daily.     warfarin (COUMADIN ) 6 MG tablet Take 6-9 mg by mouth daily. Take 9mg  Monday, Wednesday, and Fridays, then all other days take 6mg s daily     No facility-administered medications prior to visit.    PAST MEDICAL HISTORY: Past Medical History:  Diagnosis Date   Anxiety    Clotting disorder (HCC)    Depression    Dyspnea    GERD (gastroesophageal reflux disease)    Headache    Lumbosacral spondylosis without myelopathy    Neuropathy    Postlaminectomy syndrome, lumbar region    Postlaminectomy syndrome, thoracic region    Stroke [facility])     PAST SURGICAL HISTORY: Past Surgical History:  Procedure Laterality Date   ABDOMINAL HYSTERECTOMY     BACK SURGERY     BIOPSY  10/02/2020   Procedure: BIOPSY;  Surgeon: Kristie Lamprey, MD;  Location: [facility] ENDOSCOPY;  Service: Endoscopy;;   COLONOSCOPY WITH PROPOFOL  N/A 10/02/2020   Procedure: COLONOSCOPY WITH  PROPOFOL ;  Surgeon: Kristie Lamprey, MD;  Location: [facility] ENDOSCOPY;  Service: Endoscopy;  Laterality: N/A;   ESOPHAGOGASTRODUODENOSCOPY (EGD) WITH PROPOFOL  N/A 10/02/2020   Procedure: ESOPHAGOGASTRODUODENOSCOPY (EGD) WITH PROPOFOL ;  Surgeon: Kristie Lamprey, MD;  Location: [facility] ENDOSCOPY;  Service: Endoscopy;  Laterality: N/A;   EYE SURGERY     eye removed right    GIVENS CAPSULE STUDY N/A 10/02/2020   Procedure: GIVENS CAPSULE STUDY;  Surgeon: Kristie Lamprey, MD;  Location: [facility] ENDOSCOPY;  Service: Endoscopy;  Laterality: N/A;   KNEE ARTHROSCOPY     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   Procedure: TOTAL KNEE ARTHROPLASTY;  Surgeon: Maude Herald, MD;  Location: [facility] OR;  Service: Orthopedics;  Laterality: Right;    FAMILY HISTORY: Family History  Problem Relation Age of Onset   Cancer Mother    Diabetes Mother    Migraines Sister    Migraines Brother     SOCIAL HISTORY: Social History   Socioeconomic History   Marital status: Single    Spouse name: Not on file   Number of children: 2   Years of education: Not on file   Highest education level: Not on file  Occupational History   Not on file  Tobacco Use   Smoking status: Never   Smokeless tobacco: Never  Vaping Use   Vaping status: Never Used  Substance and Sexual Activity   Alcohol  use: No   Drug use: No   Sexual activity: Not on file  Other Topics Concern   Not on file  Social History Narrative   Not on file   Social Drivers of Health   Financial Resource Strain: Not on file  Food Insecurity: Not on file  Transportation Needs: Not on file  Physical Activity: Not on file  Stress: Not on file  Social Connections: Unknown (10/11/2021)   Received from [facility]   Social Network    Social Network: Not on file  Intimate Partner Violence: Unknown (10/11/2021)   Received from [facility]   HITS    Physically Hurt: Not on file    Insult or Talk Down To: Not on file    Threaten Physical Harm: Not on file    Scream or  Curse: Not on file    PHYSICAL EXAM  There were no vitals filed for this visit.    There is no height or weight on file to calculate BMI.  Generalized: Well developed, in no acute distress  Cardiology: normal rate and rhythm, no murmur noted Respiratory: clear to auscultation bilaterally  Neurological examination  Mentation: Alert oriented to time, place, history taking. Follows all commands speech and language fluent Cranial nerve II-XII: Pupils were equal round reactive to light. Left Extraocular movements were full, right prosthesis, peripheral field loss of left eye.  Motor: The motor testing reveals 4+ over 5 strength of all 4 extremities. Good symmetric motor tone is noted throughout.  Sensory: Sensory testing is intact to soft touch on all 4 extremities. No evidence of extinction is noted. Pinprick testing intact bilaterally with exception of decreased sensation of heel, thickened skin noted of heels.  Coordination: Cerebellar testing reveals good finger-nose-finger and heel-to-shin bilaterally.  Gait and station: Gait is stable with single prong cane, slight right limp noted     DIAGNOSTIC DATA (LABS, IMAGING, TESTING) - I reviewed patient records, labs, notes, testing and imaging myself where available.      No data to display           Lab Results  Component Value Date   WBC 7.8 08/24/2023   HGB 11.6 (L) 08/24/2023   HCT 35.9 (L) 08/24/2023   MCV 100.8 (H) 08/24/2023   PLT 231 08/24/2023      Component Value Date/Time   NA 136 08/24/2023 0158   K 3.7 08/24/2023 0158   CL 110 08/24/2023 0158   CO2 17 (L) 08/24/2023 0158   GLUCOSE 105 (H) 08/24/2023 0158   BUN 10 08/24/2023 0158  CREATININE 0.81 08/24/2023 0158   CALCIUM  8.2 (L) 08/24/2023 0158   PROT 6.9 03/12/2023 0023   PROT 6.9 04/19/2019 1059   ALBUMIN 3.0 (L) 03/12/2023 0023   AST 17 03/12/2023 0023   ALT 16 03/12/2023 0023   ALKPHOS 73 03/12/2023 0023   BILITOT 0.2 03/12/2023 0023   GFRNONAA  >60 08/24/2023 0158   GFRAA >60 02/25/2018 1329   Lab Results  Component Value Date   CHOL  07/11/2009    180        ATP III CLASSIFICATION:  <200     mg/dL   Desirable  799-760  mg/dL   Borderline High  >=759    mg/dL   High          HDL 66 07/11/2009   LDLCALC (H) 07/11/2009    103        Total Cholesterol/HDL:CHD Risk Coronary Heart Disease Risk Table                     Men   Women  1/2 Average Risk   3.4   3.3  Average Risk       5.0   4.4  2 X Average Risk   9.6   7.1  3 X Average Risk  23.4   11.0        Use the calculated Patient Ratio above and the CHD Risk Table to determine the patient's CHD Risk.        ATP III CLASSIFICATION (LDL):  <100     mg/dL   Optimal  899-870  mg/dL   Near or Above                    Optimal  130-159  mg/dL   Borderline  839-810  mg/dL   High  >809     mg/dL   Very High   TRIG 53 94/89/7988   CHOLHDL 2.7 07/11/2009   Lab Results  Component Value Date   HGBA1C 5.4 04/19/2019   Lab Results  Component Value Date   VITAMINB12 4,770 (H) 09/30/2020      ASSESSMENT AND PLAN 69 y.o. year old female  has a past medical history of Anxiety, Clotting disorder (HCC), Depression, Dyspnea, GERD (gastroesophageal reflux disease), Headache, Lumbosacral spondylosis without myelopathy, Neuropathy, Postlaminectomy syndrome, lumbar region, Postlaminectomy syndrome, thoracic region, and Stroke (HCC). here with   No diagnosis found.   Emily Peck continues to have significant burning pain of bilateral feet. She does not feel symptoms are any worse and are improved with gabapentin . She is currently at max dose of gabapentin . No obvious adverse effects noted but we have reviewed potential for increased dizziness and sedation. She continues chronic pain medications as well and we will monitor closely. She is followed closely by PCP . No recent falls. I have discussed concerns of positive drug screen with Dr Carilyn. I have explained concerns/dangers with  combining opiate/sedating pain medications with alcohol  use. She is adamant that alcohol  use is only on occasion and not a regular habit. She was doing well with Qutenza  patches and would benefit from these in the future. If not able to return to Dr Carilyn we will consider referral another provider but she is aware that all pain management provider will have similar guidelines. She will continue close follow up with PCP. Healthy lifestyle habits encouraged. She will call for any worsening and follow up with me in 1 year. She verbalizes understanding and agreement with this plan.  No orders of the defined types were placed in this encounter.    No orders of the defined types were placed in this encounter.    Greig Forbes, FNP-C 11/05/2023, 2:21 PM [facility] [address] [place], [place] [address] [phone]

## 2023-11-05 NOTE — Patient Instructions (Incomplete)

## 2023-11-10 ENCOUNTER — Ambulatory Visit: Admitting: Family Medicine

## 2023-11-10 DIAGNOSIS — G629 Polyneuropathy, unspecified: Secondary | ICD-10-CM

## 2023-12-16 ENCOUNTER — Ambulatory Visit: Attending: Cardiology | Admitting: Cardiology

## 2023-12-16 ENCOUNTER — Encounter: Payer: Self-pay | Admitting: Cardiology

## 2023-12-16 VITALS — BP 108/64 | HR 64 | Resp 16 | Ht 63.0 in | Wt 201.4 lb

## 2023-12-16 DIAGNOSIS — I2089 Other forms of angina pectoris: Secondary | ICD-10-CM | POA: Diagnosis not present

## 2023-12-16 DIAGNOSIS — R931 Abnormal findings on diagnostic imaging of heart and coronary circulation: Secondary | ICD-10-CM

## 2023-12-16 DIAGNOSIS — E7800 Pure hypercholesterolemia, unspecified: Secondary | ICD-10-CM

## 2023-12-16 MED ORDER — NITROGLYCERIN 0.4 MG SL SUBL: 0.4000 mg | SUBLINGUAL_TABLET | SUBLINGUAL | 2 refills | Status: AC | PRN

## 2023-12-16 MED ORDER — NITROGLYCERIN 0.4 MG SL SUBL: 0.4000 mg | SUBLINGUAL_TABLET | SUBLINGUAL | 2 refills | Status: DC | PRN

## 2023-12-16 MED ORDER — AMLODIPINE BESYLATE 2.5 MG PO TABS
2.5000 mg | ORAL_TABLET | Freq: Every evening | ORAL | Status: AC
Start: 2023-12-16 — End: 2024-03-15

## 2023-12-16 NOTE — Progress Notes (Signed)
 Cardiology Office Note:  .   Date:  12/16/2023  ID:  Emily Peck, DOB 08/25/1955, MRN [id number] PCP: No primary care provider on file.  [facility] Providers Cardiologist:  Gordy Bergamo, MD   History of Present Illness: .   Emily Peck is a 68 y.o.  Female patient presents for follow-up of angina pectoris and chronic dyspnea. 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    Underwent myocardial perfusion imaging on 10/15/2023 revealing coronary calcification on CT images, large ischemic defect in the anterior, anteroseptal and inferoseptal region with ejection fraction of 82%, considered to be intermediate risk.  Echocardiogram on the same day revealed normal LVEF with moderate asymmetric LVH in the basal septum without LVOT gradient.  RV was mildly enlarged.   Presently on medical therapy with optimization for symptoms of stable angina and also hyperglycemia and presents for a 6-week follow-up visit.  States that she has been doing well and symptoms of angina has improved significantly but has used occasional nitroglycerin     Discussed the use of AI scribe software for clinical note transcription with the patient, who gave verbal consent to proceed.  History of Present Illness Emily Peck is a 68 year old female with coronary artery disease and stable angina who presents for follow-up.  She experiences occasional angina, effectively relieved by nitroglycerin . She takes isosorbide  mononitrate 30 mg daily and nitroglycerin  as needed, requesting a refill due to low supply. She was prescribed amlodipine  2.5 mg but initially avoided it due to concerns about hypotension. She takes Lipitor  20 mg daily for cholesterol management. Stress significantly impacts her condition, and she is working on Optician, dispensing.   Labs   Recent Labs    03/12/23 0023 03/12/23 0028 08/24/23 0158  NA 137 139 136  K 3.5 3.6 3.7  CL 109 110 110  CO2 20*  --  17*  GLUCOSE 111* 106* 105*  BUN 11 11 10   CREATININE 0.86 0.80 0.81  CALCIUM  8.8*  --  8.2*  GFRNONAA >60  --  >60    Lab Results  Component Value Date   ALT 16 03/12/2023   AST 17 03/12/2023   ALKPHOS 73 03/12/2023   BILITOT 0.2 03/12/2023      Latest Ref Rng & Units 08/24/2023    1:58 AM 03/12/2023   12:28 AM 03/12/2023   12:23 AM  CBC  WBC 4.0 - 10.5 K/uL 7.8   9.0   Hemoglobin 12.0 - 15.0 g/dL 88.3  87.3  87.8   Hematocrit 36.0 - 46.0 % 35.9  37.0  38.0   Platelets 150 - 400 K/uL 231   271    Lab Results  Component Value Date   HGBA1C 5.4 04/19/2019      ROS  Review of Systems  Cardiovascular:  Positive for chest pain. Negative for dyspnea on exertion and leg swelling.   Physical Exam:   VS:  BP 108/64 (BP Location: Left Arm, Patient Position: Sitting, Cuff Size: Large)   Pulse 64   Resp 16   Ht 5' 3 (1.6 m)   Wt 201 lb 6.4 oz (91.4 kg)   SpO2 97%   BMI 35.68 kg/m    Wt Readings from Last  3 Encounters:  12/16/23 201 lb 6.4 oz (91.4 kg)  10/29/23 206 lb 12.8 oz (93.8 kg)  10/15/23 210 lb (95.3 kg)    BP Readings from Last 3 Encounters:  12/16/23 108/64  10/29/23 102/62  08/28/23 101/65   Physical Exam Neck:     Vascular: No carotid bruit or JVD.  Cardiovascular:     Rate and Rhythm: Normal rate and regular rhythm.     Heart sounds: Normal heart sounds. No murmur heard.    No gallop.  Pulmonary:     Effort: Pulmonary effort is normal.     Breath sounds: Normal breath sounds.  Abdominal:     General: Bowel sounds are normal.     Palpations: Abdomen is soft.  Musculoskeletal:     Right lower leg: No edema.     Left lower leg: No edema.    EKG:         ASSESSMENT AND PLAN: .       ICD-10-CM   1. Stable angina pectoris  I20.89 amLODipine  (NORVASC ) 2.5 MG tablet    nitroGLYCERIN  (NITROSTAT ) 0.4 MG SL tablet    DISCONTINUED: nitroGLYCERIN  (NITROSTAT ) 0.4 MG SL tablet    2. Abnormal nuclear cardiac imaging test  R93.1     3. Pure hypercholesterolemia  E78.00 Lipid panel     Assessment & Plan Coronary artery disease with stable angina Intermittent chest pain managed with nitroglycerin  and isosorbide  mononitrate. Reports improvement in symptoms after nitroglycerin . Amlodipine  was previously prescribed but not taken due to misunderstanding about its purpose. Concerns about low blood pressure were addressed, and she was reassured about the safety of the prescribed dose. - Restart amlodipine  2.5 mg at night - Continue isosorbide  mononitrate 30 mg once a day - Continue nitroglycerin  as needed - Provide a list of current medications to carry at all times  Hypercholesterolemia On Lipitor 20 mg once a day. Goal LDL is less than 70. Blood work to check cholesterol levels is planned. - Continue Lipitor 20 mg once a day - Order lipid panel to check cholesterol levels   Follow up: 6 months for angina pectoris and hyperlipidemia  Signed,  Gordy Bergamo, MD, [facility] 12/16/2023, 1:35 PM [facility] [address] [place], [place] [address] Phone: [phone]. Fax:  [phone]

## 2023-12-16 NOTE — Patient Instructions (Signed)
 Medication Instructions:  RESTART Amlodipine - Take 1 tablet (2.5 mg total) by mouth every evening   Refilled Nitroglycerin   *If you need a refill on your cardiac medications before your next appointment, please call your pharmacy*  Lab Work TODAY: Lipid Panel  If you have labs (blood work) drawn today and your tests are completely normal, you will receive your results only by: MyChart Message (if you have MyChart) OR A paper copy in the mail If you have any lab test that is abnormal or we need to change your treatment, we will call you to review the results.  Testing/Procedures: NONE  Follow-Up: At [facility], you and your health needs are our priority.  As part of our continuing mission to provide you with exceptional heart care, our providers are all part of one team.  This team includes your primary Cardiologist (physician) and Advanced Practice Providers or APPs (Physician Assistants and Nurse Practitioners) who all work together to provide you with the care you need, when you need it.  Your next appointment:   6 Months   Provider:   Gordy Bergamo, MD    We recommend signing up for the patient portal called MyChart.  Sign up information is provided on this After Visit Summary.  MyChart is used to connect with patients for Virtual Visits (Telemedicine).  Patients are able to view lab/test results, encounter notes, upcoming appointments, etc.  Non-urgent messages can be sent to your provider as well.   To learn more about what you can do with MyChart, go to [email or web address].

## 2024-01-08 NOTE — Patient Instructions (Incomplete)

## 2024-01-08 NOTE — Progress Notes (Deleted)
 PATIENT: Emily Peck DOB: 1956-01-22  REASON FOR VISIT: follow up HISTORY FROM: patient  No chief complaint on file.   HISTORY OF PRESENT ILLNESS:  01/12/24 ALL: Emily Peck returns for follow up for peripheral neuropathy. She was last seen by me 07/2022 and continued gabapentin  1200mg  TID.   08/01/2022 ALL:  Emily Peck returns for follow up for peripheral neuropathy. She continues gabapentin  1200mg  TID. She feels that it helps to control pain. She continues to have constant burning, worse at night. She had more sharp stabbing pains at bedtime. Overall, symptoms are stable on gabapentin . Qutenza  patches were helping. She is no longer followed by Dr Carilyn. Drug screen positive for alcohol  and warning letter sent. Emily Peck reports occasional alcohol  use and was offended by letter. She has not discussed with Dr Carilyn.   She was seen by Dr Ines in consult for headaches 12/2021. MRI cervical spine ordered and showed degenerative changes but no specific nerve root compression. ESIs offered. PT/OT ordered. She completed therapy 02/2022 as goals were met. She reports headaches have been better. She is able to treat conservatively.   From a thorough review of records, medications tried that can be used in headache management include: Gabapentin , topiramate , Celexa, Lexapro , magnesium, trazodone , hydrocodone , Flexeril , Tylenol , Tylenol  3, aspirin, baclofen , Soma , Flexeril , Valium , codeine , Benadryl , ibuprofen, ketorolac  injections, lidocaine  injections and patches, meclizine, Robaxin , Reglan  orally and injection, Zofran  oral and injection, prednisone  tablets, tizanidine , tramadol    07/31/2021 ALL: Emily Peck returns for follow up for peripheral neuropathy. She continues gabapentin  1200mg  TID. She was last seen 03/2020 and we attempted to get a topical compounded neuropathy cream covered. She was unable to afford. She talked to a rep about Qutenza  but wasn't sure she could afford. She continues to have  waxing and waning burning sensation of both feet. Dorsal area of foot bothers her more, recently. She also reports swelling of her feet over the past year. She is taking Lasix . Compression stockings rarely used as they tend to increase neuropathy pain. She is followed closely by Dr Carilyn with pain management.   03/29/2020 ALL:  She returns today for follow up for peripheral neuropathy on gabapentin . She continues 1200mg  TID. We ordered a compounded neuropathy cream at last visit but she reports that she never heard back about this. I will resend orders today. Pain continues. Her feet burn all the time. She feels it is most bothersome at night. She has significant pain in the lower legs and they are very tender to touch. They stay red. She is followed by pain management on Tylenol  $ but it does not help at all. She has consistent low back pain. She tries to be active but hurts all the time. She continues to have falls regularly. She feels it is related to not being able to see. She does use a cane at all times. She is able to navigate public transportation.   She was seen by Dr Chalice in 04/2019 for excessive daytime sleepiness. Sleep study was normal with no evidence of sleep disordered breathing. 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 She was encouraged to focus on sleep hygiene. Melatonin recommended. She has not noted any significant changes but admits that sleep schedule is not consistent.   03/30/2019 ALL: Emily Peck is a 68 y.o. female here today for follow up for peripheral neuropathy. She continues to have burning pain in bilateral lower extremities. She is taking gabapentin  1200mg  three times daily. She does feel that this help.  Pain is better with rest, worse with activity. She denies changes in her gait. She does have a history of falls. She contributes this to vision loss. She has a prosthetic right eye and limited visual file with left eye. She is followed  closely by PCP. She denies falls when she uses her cane.   HISTORY: (copied from [facility] note on 10/15/2017)  UPDATE 8/14/2019CM Emily Peck, 68 year old female returns for follow-up with polyneuropathy in her feet for years which has progressed over time.  It is a pins-and-needles sensation.  She is currently on gabapentin  with benefit.  She continues to see Dr. Gemma for chronic low back pain and lumbar medial branch blocks.  She has a appointment in September.  She claims the injections last about 6 months.  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  She has seen a specialist at [facility] that has said nothing else can be done for her vision.  She was encouraged to be safe with her ambulation. She ambulates with a single-point cane.  She returns for reevaluation   UPDATE 05/04/2018CM Emily Peck, 68 year old female returns for follow-up with history of polyneuropathy in her feet for years which has progressively worsened over time. She has a pins and needles burning in the feet, used to be only on the bottom of the feet balance on the top of the feet as well.She had right knee replacement November 2017. She is still in physical therapy. She continues to see Dr.Kirsteins  for chronic low back pain and receives lumbar medial branch blocks. She has an appointment today. She says the injections last about 6 months. She continues to have lot of left leg pain due to her knee surgery. She denies any falls. She is ambulating with single-point cane. She returns for reevaluation   HISTORY 10/19/15 AACherry L Peck is a 68 y.o. female here as a referral from Dr. Jarold for neuropathy in the feet. PMHx of hyperlipidemia, chronic lumbar radiculopathy, polyneuropathy, obesity, thoracic compression fractures T10-T11 s/pT12-L1 fusion, depression, glaucoma, retinal detachment, macular degeneration and poor vision.. She has had  neuropathy in the feet for years. Progressively worsening, continuous. Pins and needles, burning in the feet, it used to be under the bottom now it on the top also. She denies diabetes or using any medications that can cause neuropathy she has never been exposed to toxins that can cause neuropathy. Mother was diabetic and had neuropathy. Associated ith cramps in the feet. The burning is the worst. Worse at night. The gabapentin  helps. She takes 600mg  4x a day. Continous all day. Unknown inciting events. No medications that can induce neuropathy. No alcohol  use. She takes 600mg  neurontin  4x a day.    REVIEW OF SYSTEMS: Out of a complete 14 system review of symptoms, the patient complains only of the following symptoms, neuropathy, chronic pain, vision loss and all other reviewed systems are negative.   ALLERGIES: Allergies  Allergen Reactions   Codeine      Other reaction(s): itching Other reaction(s): itching   No Known Allergies     HOME MEDICATIONS: Outpatient Medications Prior to Visit  Medication Sig Dispense Refill   amLODipine  (NORVASC ) 2.5 MG tablet Take 1 tablet (2.5 mg total) by mouth every evening.     Ascorbic Acid (VITAMIN C PO) Take 1 tablet by mouth daily.     atorvastatin  (LIPITOR) 20 MG tablet Take 1 tablet (20 mg total) by  mouth daily. 90 tablet 1   B Complex Vitamins (VITAMIN B COMPLEX PO) Take 1 tablet by mouth daily.     cyclobenzaprine  (FLEXERIL ) 5 MG tablet Take 5 mg by mouth at bedtime as needed.     dimenhyDRINATE (DRAMAMINE) 50 MG tablet Take 25 mg by mouth at bedtime as needed for dizziness.     diphenhydrAMINE  (BENADRYL ) 25 MG tablet Take 25-50 mg by mouth 4 (four) times daily as needed for itching. Takes with Tylenol  #4 due to codeine  allergy.     escitalopram  (LEXAPRO ) 20 MG tablet Take 20 mg by mouth at bedtime.   0   FEROSUL 325 (65 Fe) MG tablet Take 325 mg by mouth daily.     fluticasone (FLONASE) 50 MCG/ACT nasal spray Place 1 spray into both nostrils in  the morning and at bedtime.     gabapentin  (NEURONTIN ) 600 MG tablet Take 2 tablets (1,200 mg total) by mouth 3 (three) times daily. 540 tablet 3   HYDROcodone -acetaminophen  (NORCO) 5-325 MG tablet Take 1 tablet by mouth 2 (two) times daily as needed for moderate pain. 60 tablet 0   isosorbide  mononitrate (IMDUR ) 30 MG 24 hr tablet Take 1 tablet (30 mg total) by mouth daily. 30 tablet 6   meclizine (ANTIVERT) 25 MG tablet Take 25 mg by mouth 2 (two) times daily.     nitroGLYCERIN  (NITROSTAT ) 0.4 MG SL tablet Place 1 tablet (0.4 mg total) under the tongue every 5 (five) minutes as needed for chest pain. 25 tablet 2   pantoprazole  (PROTONIX ) 40 MG tablet Take 1 tablet (40 mg total) by mouth 2 (two) times daily. 60 tablet 0   sucralfate  (CARAFATE ) 1 GM/10ML suspension Take 10 mLs (1 g total) by mouth 4 (four) times daily -  with meals and at bedtime. 420 mL 0   topiramate  (TOPAMAX ) 200 MG tablet Take 200 mg by mouth at bedtime.     traZODone  (DESYREL ) 150 MG tablet Take 150 mg by mouth at bedtime.  0   VITAMIN D PO Take 1 tablet by mouth daily.     warfarin (COUMADIN ) 6 MG tablet Take 6-9 mg by mouth daily. Take 9mg  Monday, Wednesday, and Fridays, then all other days take 6mg s daily     No facility-administered medications prior to visit.    PAST MEDICAL HISTORY: Past Medical History:  Diagnosis Date   Anxiety    Clotting disorder    Depression    Dyspnea    GERD (gastroesophageal reflux disease)    Headache    Lumbosacral spondylosis without myelopathy    Neuropathy    Postlaminectomy syndrome, lumbar region    Postlaminectomy syndrome, thoracic region    Stroke [facility])     PAST SURGICAL HISTORY: Past Surgical History:  Procedure Laterality Date   ABDOMINAL HYSTERECTOMY     BACK SURGERY     BIOPSY  10/02/2020   Procedure: BIOPSY;  Surgeon: Kristie Lamprey, MD;  Location: [facility] ENDOSCOPY;  Service: Endoscopy;;   COLONOSCOPY WITH PROPOFOL  N/A 10/02/2020   Procedure: COLONOSCOPY WITH PROPOFOL ;   Surgeon: Kristie Lamprey, MD;  Location: [facility] ENDOSCOPY;  Service: Endoscopy;  Laterality: N/A;   ESOPHAGOGASTRODUODENOSCOPY (EGD) WITH PROPOFOL  N/A 10/02/2020   Procedure: ESOPHAGOGASTRODUODENOSCOPY (EGD) WITH PROPOFOL ;  Surgeon: Kristie Lamprey, MD;  Location: [facility] ENDOSCOPY;  Service: Endoscopy;  Laterality: N/A;   EYE SURGERY     eye removed right    GIVENS CAPSULE STUDY N/A 10/02/2020   Procedure: GIVENS CAPSULE STUDY;  Surgeon: Kristie Lamprey, MD;  Location:  [facility] ENDOSCOPY;  Service: Endoscopy;  Laterality: N/A;   KNEE ARTHROSCOPY     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   Procedure: TOTAL KNEE ARTHROPLASTY;  Surgeon: Maude Herald, MD;  Location: [facility] OR;  Service: Orthopedics;  Laterality: Right;    FAMILY HISTORY: Family History  Problem Relation Age of Onset   Cancer Mother    Diabetes Mother    Migraines Sister    Migraines Brother     SOCIAL HISTORY: Social History   Socioeconomic History   Marital status: Single    Spouse name: Not on file   Number of children: 2   Years of education: Not on file   Highest education level: Not on file  Occupational History   Not on file  Tobacco Use   Smoking status: Never   Smokeless tobacco: Never  Vaping Use   Vaping status: Never Used  Substance and Sexual Activity   Alcohol  use: No   Drug use: No   Sexual activity: Not on file  Other Topics Concern   Not on file  Social History Narrative   Not on file   Social Drivers of Health   Financial Resource Strain: Not on file  Food Insecurity: Not on file  Transportation Needs: Not on file  Physical Activity: Not on file  Stress: Not on file  Social Connections: Unknown (10/11/2021)   [facility]   Social Network    Social Network: Not on file  Intimate Partner Violence: Unknown (10/11/2021)   Received from [facility]   HITS    Physically Hurt: Not on file    Insult or Talk Down To: Not on file    Threaten Physical Harm: Not on file    Scream or Curse: Not on  file    PHYSICAL EXAM  There were no vitals filed for this visit.    There is no height or weight on file to calculate BMI.  Generalized: Well developed, in no acute distress  Cardiology: normal rate and rhythm, no murmur noted Respiratory: clear to auscultation bilaterally  Neurological examination  Mentation: Alert oriented to time, place, history taking. Follows all commands speech and language fluent Cranial nerve II-XII: Pupils were equal round reactive to light. Left Extraocular movements were full, right prosthesis, peripheral field loss of left eye.  Motor: The motor testing reveals 4+ over 5 strength of all 4 extremities. Good symmetric motor tone is noted throughout.  Sensory: Sensory testing is intact to soft touch on all 4 extremities. No evidence of extinction is noted. Pinprick testing intact bilaterally with exception of decreased sensation of heel, thickened skin noted of heels.  Coordination: Cerebellar testing reveals good finger-nose-finger and heel-to-shin bilaterally.  Gait and station: Gait is stable with single prong cane, slight right limp noted     DIAGNOSTIC DATA (LABS, IMAGING, TESTING) - I reviewed patient records, labs, notes, testing and imaging myself where available.      No data to display           Lab Results  Component Value Date   WBC 7.8 08/24/2023   HGB 11.6 (L) 08/24/2023   HCT 35.9 (L) 08/24/2023   MCV 100.8 (H) 08/24/2023   PLT 231 08/24/2023      Component Value Date/Time   NA 136 08/24/2023 0158   K 3.7 08/24/2023 0158   CL 110 08/24/2023 0158   CO2 17 (L) 08/24/2023 0158   GLUCOSE 105 (H) 08/24/2023 0158   BUN 10 08/24/2023 0158  CREATININE 0.81 08/24/2023 0158   CALCIUM  8.2 (L) 08/24/2023 0158   PROT 6.9 03/12/2023 0023   PROT 6.9 04/19/2019 1059   ALBUMIN 3.0 (L) 03/12/2023 0023   AST 17 03/12/2023 0023   ALT 16 03/12/2023 0023   ALKPHOS 73 03/12/2023 0023   BILITOT 0.2 03/12/2023 0023   GFRNONAA >60 08/24/2023  0158   GFRAA >60 02/25/2018 1329   Lab Results  Component Value Date   CHOL  07/11/2009    180        ATP III CLASSIFICATION:  <200     mg/dL   Desirable  799-760  mg/dL   Borderline High  >=759    mg/dL   High          HDL 66 07/11/2009   LDLCALC (H) 07/11/2009    103        Total Cholesterol/HDL:CHD Risk Coronary Heart Disease Risk Table                     Men   Women  1/2 Average Risk   3.4   3.3  Average Risk       5.0   4.4  2 X Average Risk   9.6   7.1  3 X Average Risk  23.4   11.0        Use the calculated Patient Ratio above and the CHD Risk Table to determine the patient's CHD Risk.        ATP III CLASSIFICATION (LDL):  <100     mg/dL   Optimal  899-870  mg/dL   Near or Above                    Optimal  130-159  mg/dL   Borderline  839-810  mg/dL   High  >809     mg/dL   Very High   TRIG 53 94/89/7988   CHOLHDL 2.7 07/11/2009   Lab Results  Component Value Date   HGBA1C 5.4 04/19/2019   Lab Results  Component Value Date   VITAMINB12 4,770 (H) 09/30/2020      ASSESSMENT AND PLAN 68 y.o. year old female  has a past medical history of Anxiety, Clotting disorder, Depression, Dyspnea, GERD (gastroesophageal reflux disease), Headache, Lumbosacral spondylosis without myelopathy, Neuropathy, Postlaminectomy syndrome, lumbar region, Postlaminectomy syndrome, thoracic region, and Stroke (HCC). here with     ICD-10-CM   1. Peripheral polyneuropathy  G62.9        Vonnetta continues to have significant burning pain of bilateral feet. She does not feel symptoms are any worse and are improved with gabapentin . She is currently at max dose of gabapentin . No obvious adverse effects noted but we have reviewed potential for increased dizziness and sedation. She continues chronic pain medications as well and we will monitor closely. She is followed closely by PCP . No recent falls. I have discussed concerns of positive drug screen with Dr Carilyn. I have explained  concerns/dangers with combining opiate/sedating pain medications with alcohol  use. She is adamant that alcohol  use is only on occasion and not a regular habit. She was doing well with Qutenza  patches and would benefit from these in the future. If not able to return to Dr Carilyn we will consider referral another provider but she is aware that all pain management provider will have similar guidelines. She will continue close follow up with PCP. Healthy lifestyle habits encouraged. She will call for any worsening and follow up with me  in 1 year. She verbalizes understanding and agreement with this plan.    No orders of the defined types were placed in this encounter.    No orders of the defined types were placed in this encounter.    Greig Forbes, FNP-C 01/12/2024, 4:15 PM [facility] [address] [place], [place] [address] [phone]

## 2024-01-16 ENCOUNTER — Ambulatory Visit: Admitting: Family Medicine

## 2024-01-16 DIAGNOSIS — G629 Polyneuropathy, unspecified: Secondary | ICD-10-CM

## 2024-02-27 ENCOUNTER — Emergency Department (HOSPITAL_COMMUNITY)

## 2024-02-27 ENCOUNTER — Emergency Department (HOSPITAL_COMMUNITY)
Admission: EM | Admit: 2024-02-27 | Discharge: 2024-02-27 | Discharge status: Against Medical Advice | Attending: Emergency Medicine | Admitting: Emergency Medicine

## 2024-02-27 ENCOUNTER — Other Ambulatory Visit: Payer: Self-pay

## 2024-02-27 DIAGNOSIS — M25511 Pain in right shoulder: Secondary | ICD-10-CM | POA: Diagnosis present

## 2024-02-27 DIAGNOSIS — R519 Headache, unspecified: Secondary | ICD-10-CM | POA: Insufficient documentation

## 2024-02-27 DIAGNOSIS — W1830XA Fall on same level, unspecified, initial encounter: Secondary | ICD-10-CM | POA: Diagnosis not present

## 2024-02-27 DIAGNOSIS — Z5321 Procedure and treatment not carried out due to patient leaving prior to being seen by health care provider: Secondary | ICD-10-CM | POA: Insufficient documentation

## 2024-02-27 DIAGNOSIS — Z7901 Long term (current) use of anticoagulants: Secondary | ICD-10-CM | POA: Insufficient documentation

## 2024-02-27 LAB — CBC WITH DIFFERENTIAL/PLATELET
Abs Immature Granulocytes: 0.03 K/uL (ref 0.00–0.07)
Basophils Absolute: 0.1 K/uL (ref 0.0–0.1)
Basophils Relative: 1 %
Eosinophils Absolute: 0.2 K/uL (ref 0.0–0.5)
Eosinophils Relative: 3 %
HCT: 39.2 % (ref 36.0–46.0)
Hemoglobin: 12.5 g/dL (ref 12.0–15.0)
Immature Granulocytes: 0 %
Lymphocytes Relative: 28 %
Lymphs Abs: 2.1 K/uL (ref 0.7–4.0)
MCH: 32.3 pg (ref 26.0–34.0)
MCHC: 31.9 g/dL (ref 30.0–36.0)
MCV: 101.3 fL — ABNORMAL HIGH (ref 80.0–100.0)
Monocytes Absolute: 0.7 K/uL (ref 0.1–1.0)
Monocytes Relative: 10 %
Neutro Abs: 4.5 K/uL (ref 1.7–7.7)
Neutrophils Relative %: 58 %
Platelets: 213 K/uL (ref 150–400)
RBC: 3.87 MIL/uL (ref 3.87–5.11)
RDW: 13 % (ref 11.5–15.5)
WBC: 7.6 K/uL (ref 4.0–10.5)
nRBC: 0 % (ref 0.0–0.2)

## 2024-02-27 LAB — BASIC METABOLIC PANEL WITH GFR
Anion gap: 8 (ref 5–15)
BUN: 9 mg/dL (ref 8–23)
CO2: 24 mmol/L (ref 22–32)
Calcium: 9.2 mg/dL (ref 8.9–10.3)
Chloride: 109 mmol/L (ref 98–111)
Creatinine, Ser: 0.82 mg/dL (ref 0.44–1.00)
GFR, Estimated: >60 mL/min
Glucose, Bld: 106 mg/dL — ABNORMAL HIGH (ref 70–99)
Potassium: 4 mmol/L (ref 3.5–5.1)
Sodium: 141 mmol/L (ref 135–145)

## 2024-02-27 LAB — PROTIME-INR
INR: 2.7 — ABNORMAL HIGH (ref 0.8–1.2)
Prothrombin Time: 30.3 s — ABNORMAL HIGH (ref 11.4–15.2)

## 2024-02-27 NOTE — ED Notes (Signed)
 Pt left AMA due to feeling worse. ED staff confused but can not force pt to stay any longer.

## 2024-02-27 NOTE — ED Provider Triage Note (Signed)
 Emergency Medicine Provider Triage Evaluation Note  Emily Peck Emily Peck , a 68 y.o. female  was evaluated in triage.  Pt complains of fall that occurred on Christmas Eve.  To strike head.  Is on Coumadin .  Most recent INR was 2.9.  Also complains of right shoulder pain.  Review of Systems  Positive: As above Negative: As above  Physical Exam  BP 134/80   Pulse 76   Temp 97.7 F (36.5 C) (Oral)   Resp 16   Ht 5' 3 (1.6 m)   Wt 91.4 kg   SpO2 100%   BMI 35.69 kg/m  Gen:   Awake, no distress   Resp:  Normal effort  MSK:   Moves extremities without difficulty  Other:    Medical Decision Making  Medically screening exam initiated at 9:26 PM.  Appropriate orders placed.  Taylore L Tax was informed that the remainder of the evaluation will be completed by another provider, this initial triage assessment does not replace that evaluation, and the importance of remaining in the ED until their evaluation is complete.    Hildegard Loge, PA-C 02/27/24 2127

## 2024-02-27 NOTE — ED Triage Notes (Signed)
 Pt states she was getting out of bed on Christmas Eve around noon. Hx of vertigo and states when getting up fell to ground and hit head on floor and had knot on right side of head. Pt noticed that on top of head had a knot today. Has been feeling nauseated and tired all day. Pt is on a blood thinner.

## 2024-02-27 NOTE — ED Triage Notes (Signed)
 Patient fell on Christmas Eve and hit her head, on Warfarin. Patient states she first had one bump, now has three bumps, headache, sleepy all day, and nauseous. Patient wants to [facility] what's wrong.

## 2024-04-07 ENCOUNTER — Other Ambulatory Visit: Payer: Self-pay | Admitting: Nurse Practitioner

## 2024-04-07 DIAGNOSIS — Z1231 Encounter for screening mammogram for malignant neoplasm of breast: Secondary | ICD-10-CM

## 2024-05-10 ENCOUNTER — Ambulatory Visit

## 2025-02-07 ENCOUNTER — Ambulatory Visit: Admitting: Family Medicine
